# Patient Record
Sex: Female | Born: 1940 | Race: White | Hispanic: No | Marital: Married | Smoking: Former smoker
Health system: Southern US, Community
[De-identification: ages and names within clinical notes are randomized; demographics above are authoritative.]

## PROBLEM LIST (undated history)

## (undated) DIAGNOSIS — I4891 Unspecified atrial fibrillation: Secondary | ICD-10-CM

## (undated) DIAGNOSIS — F419 Anxiety disorder, unspecified: Secondary | ICD-10-CM

## (undated) DIAGNOSIS — K219 Gastro-esophageal reflux disease without esophagitis: Secondary | ICD-10-CM

## (undated) DIAGNOSIS — E213 Hyperparathyroidism, unspecified: Secondary | ICD-10-CM

## (undated) DIAGNOSIS — M810 Age-related osteoporosis without current pathological fracture: Secondary | ICD-10-CM

## (undated) DIAGNOSIS — I10 Essential (primary) hypertension: Secondary | ICD-10-CM

## (undated) DIAGNOSIS — Z950 Presence of cardiac pacemaker: Secondary | ICD-10-CM

## (undated) DIAGNOSIS — I82409 Acute embolism and thrombosis of unspecified deep veins of unspecified lower extremity: Secondary | ICD-10-CM

## (undated) DIAGNOSIS — I509 Heart failure, unspecified: Secondary | ICD-10-CM

## (undated) DIAGNOSIS — F329 Major depressive disorder, single episode, unspecified: Secondary | ICD-10-CM

## (undated) DIAGNOSIS — Z941 Heart transplant status: Secondary | ICD-10-CM

## (undated) DIAGNOSIS — I499 Cardiac arrhythmia, unspecified: Secondary | ICD-10-CM

## (undated) DIAGNOSIS — F32A Depression, unspecified: Secondary | ICD-10-CM

## (undated) HISTORY — PX: APPENDECTOMY: SHX54

## (undated) HISTORY — DX: Unspecified atrial fibrillation: I48.91

## (undated) HISTORY — PX: ABLATION ON ENDOMETRIOSIS: SHX5787

## (undated) HISTORY — DX: Age-related osteoporosis without current pathological fracture: M81.0

## (undated) HISTORY — PX: HEART TRANSPLANT: SHX268

## (undated) HISTORY — PX: NECK SURGERY: SHX720

## (undated) HISTORY — DX: Hyperparathyroidism, unspecified: E21.3

## (undated) HISTORY — PX: TOTAL ABDOMINAL HYSTERECTOMY W/ BILATERAL SALPINGOOPHORECTOMY: SHX83

## (undated) HISTORY — DX: Depression, unspecified: F32.A

## (undated) HISTORY — DX: Gastro-esophageal reflux disease without esophagitis: K21.9

## (undated) HISTORY — DX: Presence of cardiac pacemaker: Z95.0

## (undated) HISTORY — DX: Cardiac arrhythmia, unspecified: I49.9

## (undated) HISTORY — DX: Major depressive disorder, single episode, unspecified: F32.9

## (undated) HISTORY — DX: Heart failure, unspecified: I50.9

## (undated) HISTORY — PX: ABDOMINAL HYSTERECTOMY: SHX81

---

## 1984-08-28 HISTORY — PX: BREAST EXCISIONAL BIOPSY: SUR124

## 2000-09-18 ENCOUNTER — Ambulatory Visit (HOSPITAL_COMMUNITY): Admission: RE | Admit: 2000-09-18 | Discharge: 2000-09-18 | Payer: Self-pay | Admitting: Internal Medicine

## 2000-09-18 ENCOUNTER — Encounter: Payer: Self-pay | Admitting: Internal Medicine

## 2000-10-19 ENCOUNTER — Encounter: Admission: RE | Admit: 2000-10-19 | Discharge: 2000-10-19 | Payer: Self-pay | Admitting: Family Medicine

## 2000-10-19 ENCOUNTER — Encounter: Payer: Self-pay | Admitting: Family Medicine

## 2000-11-15 ENCOUNTER — Other Ambulatory Visit: Admission: RE | Admit: 2000-11-15 | Discharge: 2000-11-15 | Payer: Self-pay | Admitting: Internal Medicine

## 2000-11-15 ENCOUNTER — Encounter (INDEPENDENT_AMBULATORY_CARE_PROVIDER_SITE_OTHER): Payer: Self-pay | Admitting: Specialist

## 2001-06-25 ENCOUNTER — Encounter: Payer: Self-pay | Admitting: Family Medicine

## 2001-06-25 ENCOUNTER — Encounter: Admission: RE | Admit: 2001-06-25 | Discharge: 2001-06-25 | Payer: Self-pay | Admitting: Family Medicine

## 2002-06-26 ENCOUNTER — Encounter: Payer: Self-pay | Admitting: Family Medicine

## 2002-06-26 ENCOUNTER — Encounter: Admission: RE | Admit: 2002-06-26 | Discharge: 2002-06-26 | Payer: Self-pay | Admitting: Family Medicine

## 2003-07-06 ENCOUNTER — Ambulatory Visit (HOSPITAL_COMMUNITY): Admission: RE | Admit: 2003-07-06 | Discharge: 2003-07-06 | Payer: Self-pay | Admitting: Family Medicine

## 2003-08-29 HISTORY — PX: PACEMAKER INSERTION: SHX728

## 2003-11-04 ENCOUNTER — Encounter: Admission: RE | Admit: 2003-11-04 | Discharge: 2003-11-04 | Payer: Self-pay | Admitting: Family Medicine

## 2004-08-10 ENCOUNTER — Ambulatory Visit: Payer: Self-pay | Admitting: Internal Medicine

## 2004-08-10 ENCOUNTER — Inpatient Hospital Stay (HOSPITAL_COMMUNITY): Admission: EM | Admit: 2004-08-10 | Discharge: 2004-08-14 | Payer: Self-pay | Admitting: Emergency Medicine

## 2004-08-10 ENCOUNTER — Ambulatory Visit (HOSPITAL_COMMUNITY): Admission: RE | Admit: 2004-08-10 | Discharge: 2004-08-10 | Payer: Self-pay | Admitting: Family Medicine

## 2004-08-17 ENCOUNTER — Ambulatory Visit: Payer: Self-pay | Admitting: Family Medicine

## 2004-08-18 ENCOUNTER — Encounter: Admission: RE | Admit: 2004-08-18 | Discharge: 2004-08-18 | Payer: Self-pay | Admitting: Family Medicine

## 2004-08-18 IMAGING — CR DG CHEST 2V
2 series · 2 of 2 positions shown · non-contrast
Comparison: none

CLINICAL DATA: Bilateral effusions.  Short of breath. Follow up.
 CHEST X-RAY:
 Two views of the chest are compared to a chest x-ray from [HOSPITAL] dated [DATE].  The effusions have resolved and aeration has improved.  Cardiomegaly is stable.  Permanent pacemaker is noted.

[view not recorded (1 of 2)]
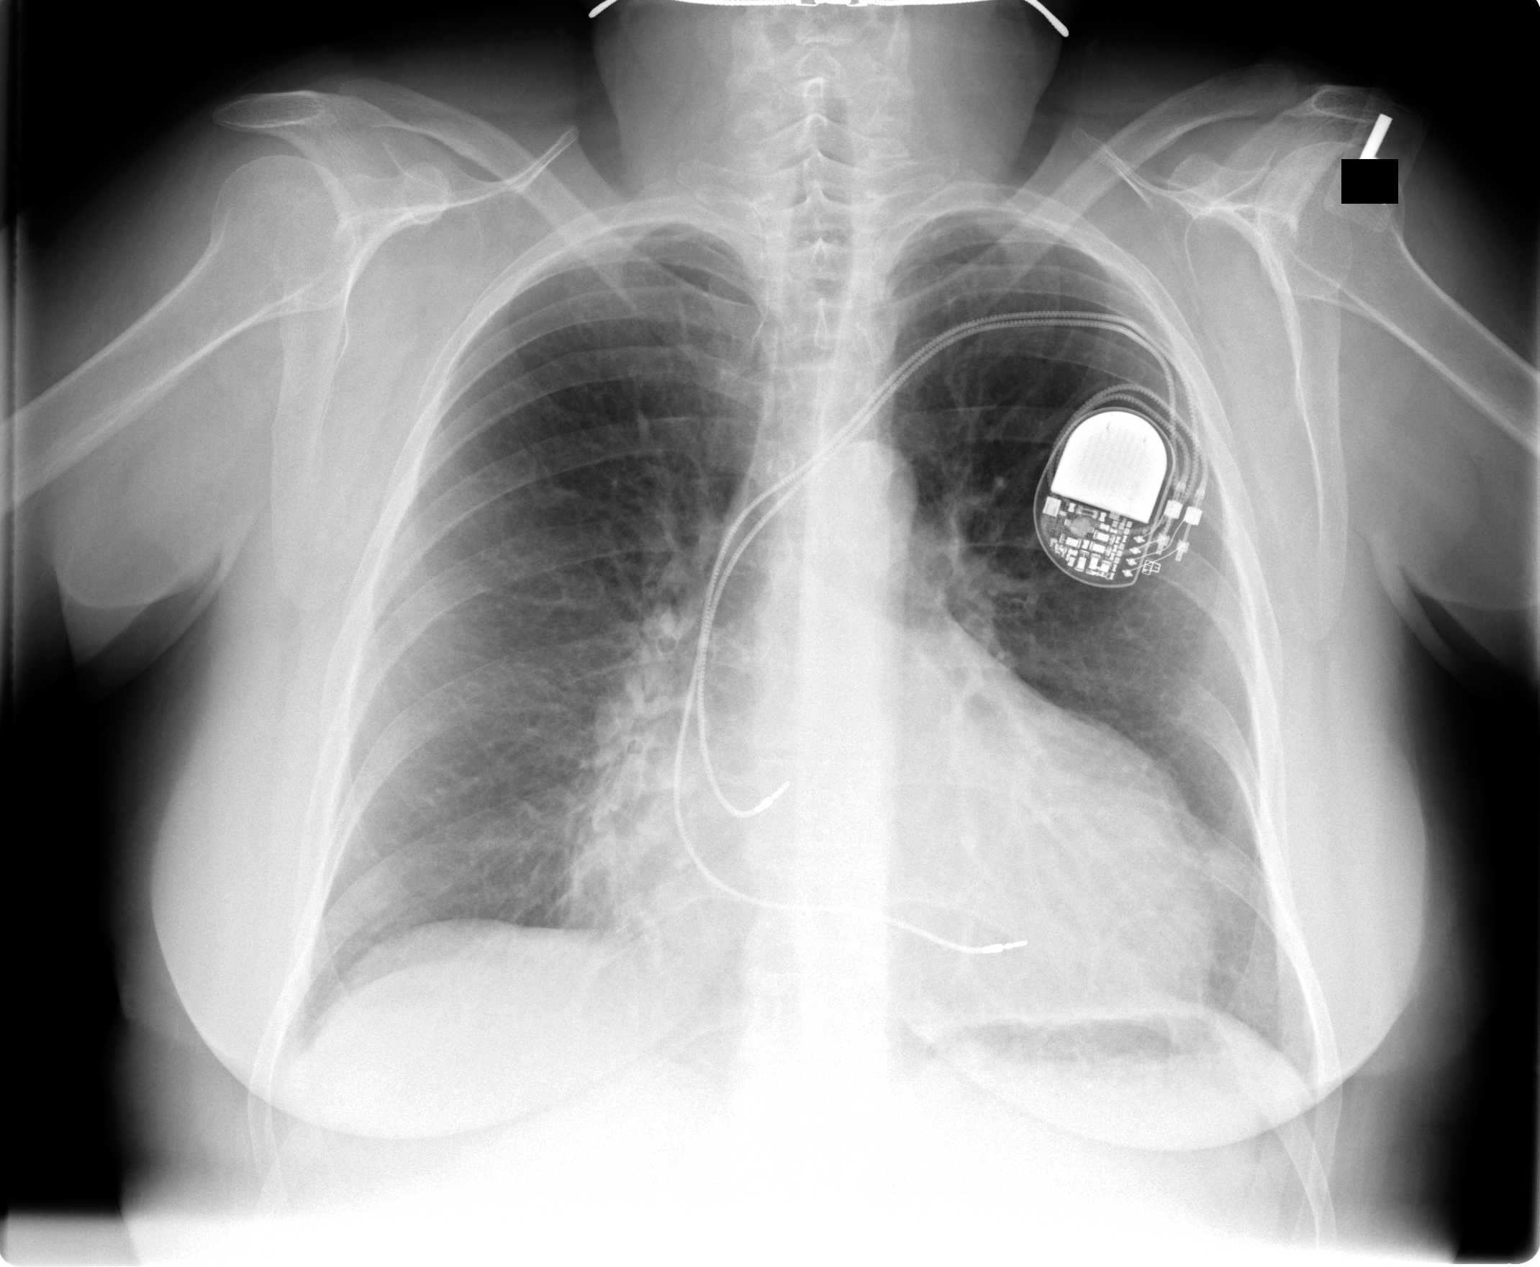

[view not recorded (2 of 2)]
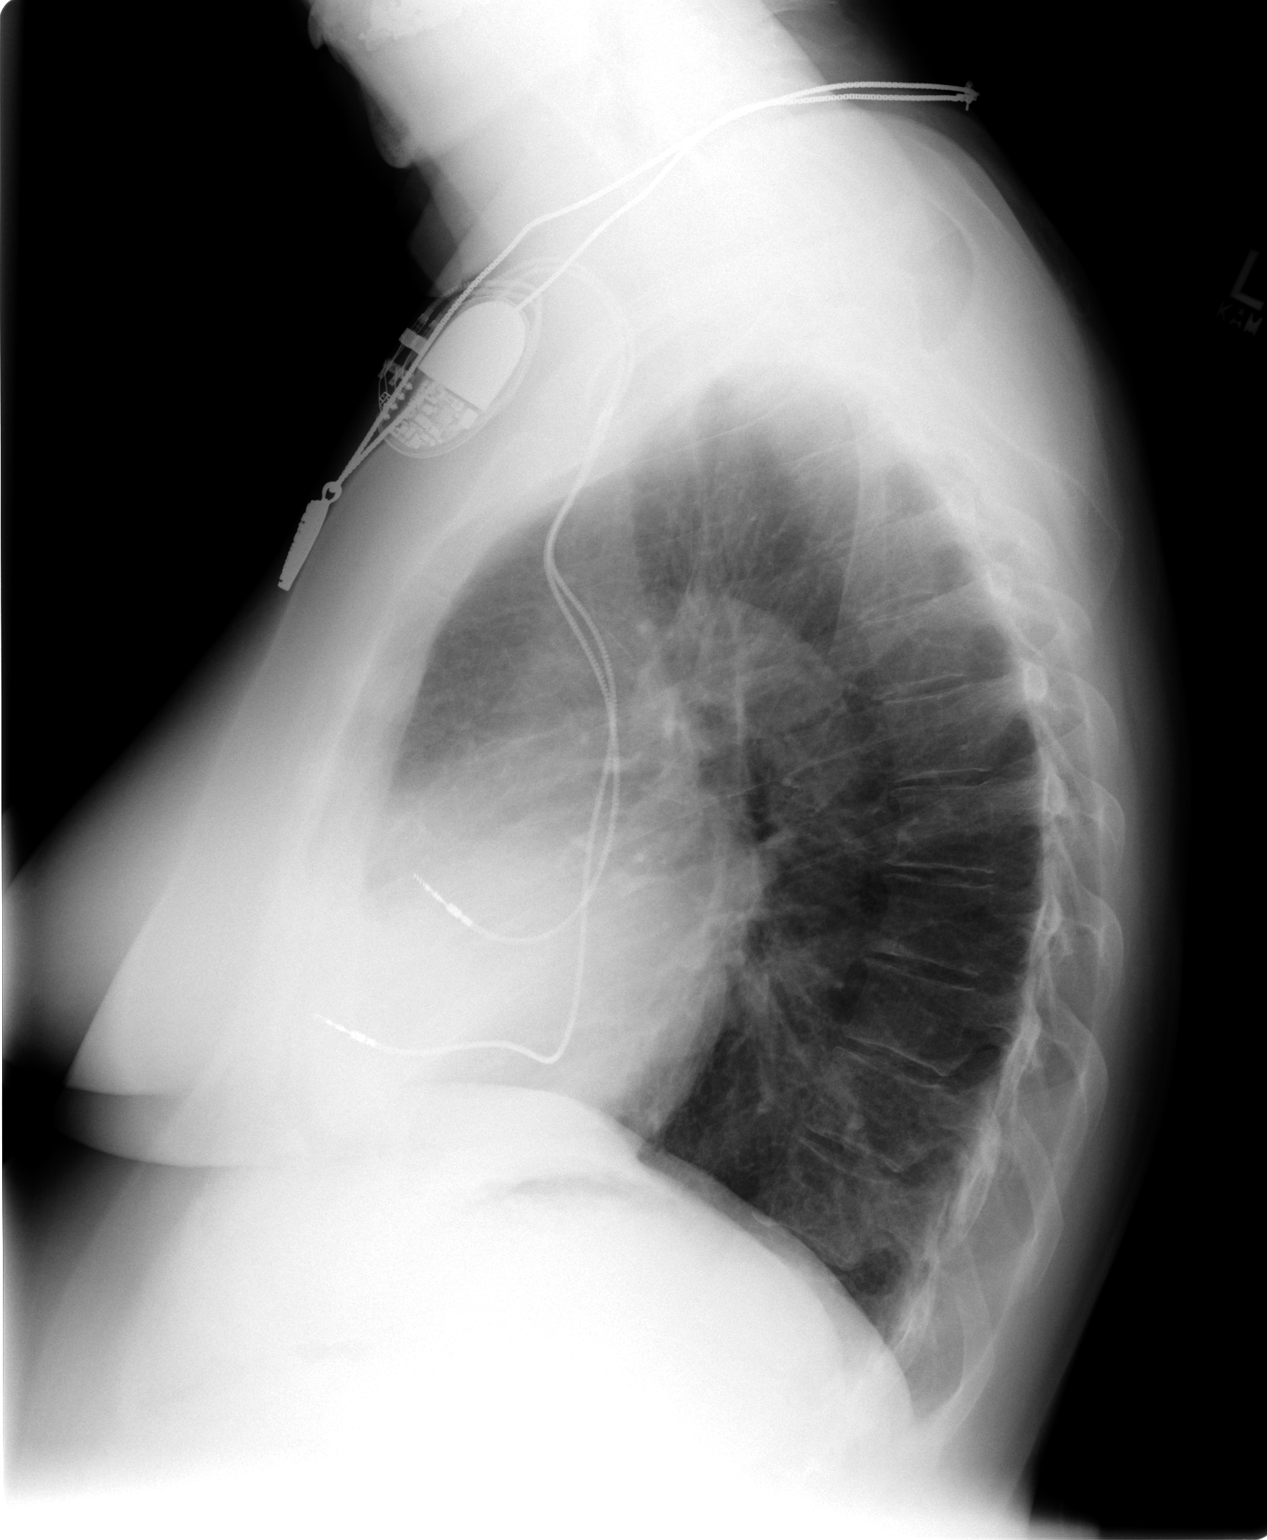

[2 of 2 positions shown; findings below may reference images not displayed]

IMPRESSION: 1.  Resolution of effusions with improved aeration.
 2.  Cardiomegaly with pacer.

## 2004-08-24 ENCOUNTER — Ambulatory Visit: Payer: Self-pay | Admitting: Family Medicine

## 2004-08-26 ENCOUNTER — Ambulatory Visit: Payer: Self-pay | Admitting: Family Medicine

## 2004-09-02 ENCOUNTER — Inpatient Hospital Stay (HOSPITAL_COMMUNITY): Admission: EM | Admit: 2004-09-02 | Discharge: 2004-09-03 | Payer: Self-pay | Admitting: Emergency Medicine

## 2004-09-03 ENCOUNTER — Ambulatory Visit: Payer: Self-pay | Admitting: Cardiology

## 2004-09-26 ENCOUNTER — Encounter: Admission: RE | Admit: 2004-09-26 | Discharge: 2004-09-26 | Payer: Self-pay | Admitting: Orthopaedic Surgery

## 2004-10-13 ENCOUNTER — Encounter (HOSPITAL_COMMUNITY): Admission: RE | Admit: 2004-10-13 | Discharge: 2005-01-11 | Payer: Self-pay | Admitting: Cardiology

## 2005-01-12 ENCOUNTER — Encounter: Admission: RE | Admit: 2005-01-12 | Discharge: 2005-01-28 | Payer: Self-pay | Admitting: Cardiology

## 2005-01-30 ENCOUNTER — Encounter (HOSPITAL_COMMUNITY): Admission: RE | Admit: 2005-01-30 | Discharge: 2005-02-25 | Payer: Self-pay | Admitting: Cardiology

## 2005-05-02 ENCOUNTER — Encounter (HOSPITAL_COMMUNITY): Admission: RE | Admit: 2005-05-02 | Discharge: 2005-07-31 | Payer: Self-pay | Admitting: Cardiology

## 2005-08-28 HISTORY — PX: ABLATION: SHX5711

## 2005-08-29 ENCOUNTER — Encounter (HOSPITAL_COMMUNITY): Admission: RE | Admit: 2005-08-29 | Discharge: 2005-11-27 | Payer: Self-pay | Admitting: Cardiology

## 2006-02-27 ENCOUNTER — Ambulatory Visit (HOSPITAL_COMMUNITY): Admission: RE | Admit: 2006-02-27 | Discharge: 2006-02-27 | Payer: Self-pay | Admitting: Internal Medicine

## 2006-04-19 ENCOUNTER — Encounter: Admission: RE | Admit: 2006-04-19 | Discharge: 2006-04-19 | Payer: Self-pay | Admitting: Gastroenterology

## 2006-05-03 ENCOUNTER — Encounter: Admission: RE | Admit: 2006-05-03 | Discharge: 2006-05-03 | Payer: Self-pay | Admitting: Internal Medicine

## 2006-06-02 ENCOUNTER — Inpatient Hospital Stay (HOSPITAL_COMMUNITY): Admission: EM | Admit: 2006-06-02 | Discharge: 2006-06-05 | Payer: Self-pay | Admitting: Emergency Medicine

## 2006-06-03 ENCOUNTER — Encounter (INDEPENDENT_AMBULATORY_CARE_PROVIDER_SITE_OTHER): Payer: Self-pay | Admitting: Cardiology

## 2006-06-03 ENCOUNTER — Encounter (INDEPENDENT_AMBULATORY_CARE_PROVIDER_SITE_OTHER): Payer: Self-pay | Admitting: Neurology

## 2007-04-09 ENCOUNTER — Ambulatory Visit (HOSPITAL_COMMUNITY): Admission: RE | Admit: 2007-04-09 | Discharge: 2007-04-09 | Payer: Self-pay | Admitting: Internal Medicine

## 2007-06-07 ENCOUNTER — Observation Stay (HOSPITAL_COMMUNITY): Admission: EM | Admit: 2007-06-07 | Discharge: 2007-06-09 | Payer: Self-pay | Admitting: Emergency Medicine

## 2007-08-29 HISTORY — PX: CARDIAC CATHETERIZATION: SHX172

## 2007-08-29 HISTORY — PX: HEART TRANSPLANT: SHX268

## 2007-11-28 ENCOUNTER — Encounter (HOSPITAL_COMMUNITY): Admission: RE | Admit: 2007-11-28 | Discharge: 2008-02-26 | Payer: Self-pay | Admitting: Cardiology

## 2008-10-29 ENCOUNTER — Ambulatory Visit (HOSPITAL_COMMUNITY): Admission: RE | Admit: 2008-10-29 | Discharge: 2008-10-29 | Payer: Self-pay | Admitting: Internal Medicine

## 2008-11-11 ENCOUNTER — Encounter (INDEPENDENT_AMBULATORY_CARE_PROVIDER_SITE_OTHER): Payer: Self-pay | Admitting: *Deleted

## 2009-09-09 ENCOUNTER — Telehealth: Payer: Self-pay | Admitting: Internal Medicine

## 2010-01-06 ENCOUNTER — Ambulatory Visit (HOSPITAL_COMMUNITY): Admission: RE | Admit: 2010-01-06 | Discharge: 2010-01-06 | Payer: Self-pay | Admitting: Internal Medicine

## 2010-06-15 ENCOUNTER — Encounter
Admission: RE | Admit: 2010-06-15 | Discharge: 2010-08-09 | Payer: Self-pay | Source: Home / Self Care | Attending: Orthopaedic Surgery | Admitting: Orthopaedic Surgery

## 2010-09-19 ENCOUNTER — Encounter: Payer: Self-pay | Admitting: Internal Medicine

## 2010-09-27 NOTE — Letter (Signed)
Summary: Recall Colonoscopy-Changed to Office Visit Letter  [facility]  [address] [place], [place] [address]   Phone: [phone]  Fax: [phone]      November 11, 2008 MRN: [id number]   [name] [name] [address] [place], [place]  [address]   Dear Ms. Juenger,   According to our records, it is time for you to schedule a Colonoscopy. However, after reviewing your medical record, I feel that an office visit would be most appropriate to more completely evaluate you and determine your need for a repeat procedure.  Please call [phone] (option #2) at your convenience to schedule an office visit. If you have any questions, concerns, or feel that this letter is in error, we would appreciate your call.   Sincerely,  Iva Boop, M.D.  [facility] [phone]

## 2010-09-27 NOTE — Progress Notes (Signed)
Summary: Schedule REV  Phone Note Outgoing Call Call back at [facility] Phone [phone]   Call placed by: Harlow Mares CMA Duncan Dull),  September 09, 2009 10:20 AM Call placed to: Patient Summary of Call: patient transfered care to Dr. Matthias Hughs, I had Lady Gary add a note in our system. Initial call taken by: Harlow Mares CMA (AAMA),  September 09, 2009 10:21 AM

## 2010-12-28 ENCOUNTER — Other Ambulatory Visit (HOSPITAL_COMMUNITY): Payer: Self-pay | Admitting: Internal Medicine

## 2010-12-28 DIAGNOSIS — Z1231 Encounter for screening mammogram for malignant neoplasm of breast: Secondary | ICD-10-CM

## 2011-01-09 ENCOUNTER — Ambulatory Visit (HOSPITAL_COMMUNITY): Payer: Self-pay

## 2011-01-10 ENCOUNTER — Ambulatory Visit (HOSPITAL_COMMUNITY)
Admission: RE | Admit: 2011-01-10 | Discharge: 2011-01-10 | Disposition: A | Discharge status: Home / Self Care | Payer: Medicare Other | Source: Ambulatory Visit | Attending: Internal Medicine | Admitting: Internal Medicine

## 2011-01-10 DIAGNOSIS — Z1231 Encounter for screening mammogram for malignant neoplasm of breast: Secondary | ICD-10-CM | POA: Insufficient documentation

## 2011-01-10 NOTE — H&P (Signed)
NAME:  [name], [name] NO.:  [id number]   MEDICAL RECORD NO.:  [id number]          PATIENT TYPE:  OBV   LOCATION:  6705                         FACILITY:  [facility]   PHYSICIAN:  Kari Baars, M.D.  DATE OF BIRTH:  04-01-1941   DATE OF ADMISSION:  06/07/2007  DATE OF DISCHARGE:                              HISTORY & PHYSICAL   CHIEF COMPLAINT:  Dizziness and fall.   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  The  patient has recently been placed on the transplant list due to her  severe cardiomyopathy.  She has being undergoing an extensive  pretransplant evaluation at [facility] [employer] and was actually  seen there yesterday. Her heart failure has been refractory to medical  therapy.  About 4 days ago, she states that she developed some upper  respiratory infection symptoms including rhinorrhea, sinus congestion  and pressure.  She took Robitussin-DM without significant improvement.  She has also had a cough.  Last evening, she noticed a significant  increase in dizziness and was unable to walk without holding onto her  husband.  She said that this was present when she was lying in bed and  when she was standing.  She describes it as in unsteadiness or swaying  feeling.  She actually did fall while trying to walk up the stairs with  her husband.  She did not have any loss of consciousness or any apparent  injury.  Due to her unsteadiness in the setting of her heart failure,  she felt unsafe to return home and was admitted for observation status.   REVIEW OF SYSTEMS:  All systems are reviewed with the patient and are  negative except as in HPI with the  following exceptions:  She does deny  chest pain, shortness of breath, edema, orthopnea, fever, chills,  sweats, headache, focal neurologic deficits.   PAST MEDICAL HISTORY:  1. 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  2. Paroxysmal atrial fibrillation status post ablation, status post      pacemaker placement, status post AICD on chronic anticoagulation.  3. Impaired glucose tolerance.  4. Hyperlipidemia.  5. Gastroesophageal reflux disease.  6. Status post total abdominal hysterectomy/bilateral salpingo-      oophorectomy.   CURRENT MEDICATIONS:  1. Digoxin 125 mcg daily.  2. Cozaar 25 mg daily.  3. Synthroid 50 mcg daily.  4. Coreg CR 20 mg daily.  5. Lasix 40 mg daily.  6. Nexium 40 mg b.i.d.  7. Spironolactone 25 mg 1/2 daily.  8. Effexor 37.5 mg two daily.  9. Klonopin 0.5 mg two q.h.s.  10.Coumadin 1.5 mg daily except for Monday, Wednesdays and Fridays      when she takes 2 mg.  11.Sucralfate 1 gram q.i.d.   ALLERGIES:  ANTIHISTAMINES and CODEINE.   FAMILY HISTORY:  Mother died of an MI at 85.   SOCIAL HISTORY:  She is married with two children.  She is actually  raising two grandchildren.  She has a remote 10-year pack-year smoking  history but quit in 1966.   PHYSICAL EXAM:  Temperature 98.6, blood pressure 113/66, pulse 74,  respirations 20, oxygen saturation 96% on room air. I reviewed the EMS  vitals and her blood pressure was 142/80 at that time with a heart rate  of 64 and oxygen saturation of 96% on room air.  GENERAL:  Fatigued-appearing, white female in no acute distress.  HEENT: Oropharynx is dry.  NECK:  Supple without lymphadenopathy, JVD or carotid bruits.  HEART:  Regular rate and rhythm with an S3.  LUNGS:  Clear to auscultation bilaterally.  No crackles.  ABDOMEN:  Soft, nondistended, nontender with normoactive bowel sounds.  EXTREMITIES:  No clubbing, cyanosis or  edema.  NEUROLOGIC:  Alert and oriented x4. No focal murdered motor or sensory  deficits.  Cranial nerves II-XII are intact.  Finger-to-nose is normal.   LABORATORY DATA:  CBC shows a white count of 12.3, hemoglobin 12.3,  platelets 268. BMET showed a sodium of 138, potassium 3.7, chloride 104,  bicarb 24, BUN 13, creatinine 1.2, glucose 113, albumin 3.2.  BNP 611  which is slightly increased from her baseline.  Troponin 0.12.  CK is  normal.   STUDIES:  1. EKG personally reviewed shows paced rhythm with PVCs.  2. Chest x-ray shows cardiomegaly with no edema.  3. Head CT shows no acute intracranial abnormalities.   ASSESSMENT/PLAN:  1. Dizziness - this is likely related to her sinus congestion and      acute sinusitis.  However, given her fall risk and frail status      with her underlying heart failure will observe her overnight      tonight.  Will hold on any additional hydration, although she may      be slightly dry, but in the setting of her heart failure I do not      want to tip her over.  Will hold on steroids for the same reason.      Meclizine as needed.  Will obtain a digoxin level, TSH, and A1c.  2. Acute sinusitis - saline nasal spray. Mucinex.  Will treat with      Augmentin 875 to cover bacterial organisms.  3. Congestive heart failure.  Continue her home regimen.  If she does      develop more overt congestive heart failure symptoms will transfer      her to [facility]. Will rule out MI although I do not think that      her symptoms are      cardiac in nature.  Will monitor on telemetry to rule out      dysrhythmia.  4. History of hypertrophy or glycemia - obtain an A1c.  5. Disposition - anticipate discharge tomorrow if stable on      antibiotics and prior medications.      Kari Baars, M.D.  Electronically Signed     WS/MEDQ  D:  06/07/2007  T:  06/07/2007  Job:  [id number]   cc:   Sandrea Matte, MD

## 2011-01-13 NOTE — Discharge Summary (Signed)
NAME:  [name] [name]                 ACCOUNT NO.:  [id number]   MEDICAL RECORD NO.:  [id number]          PATIENT TYPE:  OUT   LOCATION:  [facility]O                          FACILITY:  [facility]   PHYSICIAN:  Valetta Mole. Swords, M.D. [facility] OF BIRTH:  08-09-1941   DATE OF ADMISSION:  08/10/2004  DATE OF DISCHARGE:  08/14/2004                                 DISCHARGE SUMMARY   DISCHARGE DIAGNOSES:  1.  Nausea, vomiting and diarrhea, resolved.  2.  Fever, unknown etiology, empiric antibiotics.  3.  Tachycardia, improving, patient has a known history of arrhythmias      thought to be atrial fibrillation, status post pacemaker placement.  4.  Mood disorder.  5.  Bilateral pleural effusions.   DISCHARGE MEDICATIONS:  1.  Verapamil extended-release 180 mg daily.  2.  Klonopin 0.5 mg p.o. t.i.d.  3.  Effexor XR 150 mg p.o. daily.  4.  Prevacid 30 mg p.o. daily.  5.  (New medicine) Omnicef 300 mg one p.o. b.i.d. for five days (empiric      treatment for fever).   CONDITION ON DISCHARGE:  Improved.   RESTRICTIONS:  He is to maintain a bland diet and advance as tolerated.   FOLLOWUP PLAN:  Follow up with Dr. Tawanna Cooler this week.   HOSPITAL COURSE:  Problem 1.  Gastrointestinal. The patient presented to the  hospital on August 10, 2004, with nausea, vomiting and diarrhea.  This  resolved spontaneously at the time of discharge.  He was tolerating a diet  without difficulty.  Cardiac patient with a history of atrial fibrillation  and other arrhythmias (unknown) to acquire pacemaker placement.  In the  hospital, he had a period of decreased blood pressure.  Verapamil was to be  discontinued.  I am not sure if that was ever done.  Heart rate then  increased.  Heart rate at the time of discharge was around 100.  She  continues on Verapamil and is doing well without shortness of breath,  palpitations or chest pain.  Given the tachycardia, I did do a CT scan of  the chest which resolved as above demonstrating  bilateral pleural effusions.   Problem 2.  Infectious disease.  The patient presented with the above  symptoms.  During her hospitalization, she had a T-max of 102.7.  Antibiotics were started empirically.  CBC was normal on August 13, 2004.  Blood cultures remained negative to date.  Urine cultures were negative.   In summary, the patient presented with GI symptoms and developed a fever.  I  suspect all of the abnormal findings are related to some underlying  infection which could be viral.  I suspect her GI complaints and pulmonary  findings are all related.  I think these should be followed up as an  outpatient.  The patient is eager to go home, tolerating a diet and  ambulating without difficulty.  She will follow up with Dr. Tawanna Cooler later this  week.      BHS/MEDQ  D:  08/14/2004  T:  08/14/2004  Job:  [id number]   cc:  Jeffrey A. Tawanna Cooler, M.D. [facility]

## 2011-01-13 NOTE — Discharge Summary (Signed)
[name], [name]                 ACCOUNT NO.:  [id number]   MEDICAL RECORD NO.:  [id number]          PATIENT TYPE:  INP   LOCATION:  3743                         FACILITY:  [facility]   PHYSICIAN:  Kari Baars, M.D.  DATE OF BIRTH:  [birth year]   DATE OF ADMISSION:  06/02/2006  DATE OF DISCHARGE:  06/05/2006                                 DISCHARGE SUMMARY   DISCHARGE DIAGNOSES:  1. Possible transient ischemic attack in the setting of transient      hypotension.  2. Congestive heart failure, idiopathic, with ejection fraction previously      13%, 35% on echocardiogram during this hospitalization.  3. Paroxysmal atrial fibrillation status post ablation.  4. Status post pacemaker/automatic implantable cardioverter defibrillator      placement.  5. Depression.  6. Mitral regurgitation.  7. Gastroesophageal reflux disease.  8. Hyperglycemia.   DISCHARGE MEDICATIONS:  1. Coreg CR 20 mg daily.  2. Amiodarone 200 mg daily.  3. Digoxin 0.125 mg daily.  4. Lasix 40 mg daily.  5. Cozaar 50 mg daily.  6. Ergocalciferol 50,000 units weekly.  7. Calcium/vitamin D twice daily.  8. Nasonex 1 spray each nostril 1-2 times per day.  9. Klonopin 0.5 mg 1-2 nightly.  10.Nexium 40 mg b.i.d.  11.Carafate 1 gram p.r.n.  12.Effexor 37.5 two pills nightly.  13.Spironolactone 12.5 mg daily.  14.Premarin vaginal cream as before.  15.Vitamin B complex.  16.Coumadin as directed-will hold tonight's dose due to supratherapeutic      INR and repeat INR in 1 week.   HOSPITAL PROCEDURES:  1. Carotid Doppler/transcranial carotid ultrasound/transcranial Dopplers-      no evidence of carotid or vertebral stenosis.  2. Transthoracic echocardiogram (October 7) ejection fraction estimated at      35%.  Mild focal basal septal hypertrophy.  Increased relative      contribution of atrial contraction to left ventricular filling.      Doppler parameters consistent with abnormal left ventricular  relaxation.  Mild mitral regurgitation.  3. A head CT without contrast (October 6):  No evidence of intracranial      hemorrhage, large acute infarct, or intracranial mass.   HOSPITAL CONSULTS:  Neurology (Dr. Avie Echevaria).   HISTORY OF PRESENT ILLNESS:  For full details, please see dictated history  and physical.  Briefly,  [name] [name] is a 70 year old white female with a  history of idiopathic cardiomyopathy with an ejection fraction of 13%,  paroxysmal atrial fibrillation status post recent ablation, and vitamin D  deficiency who presented to the Emergency Department on October 6 with a  complaint of dizziness, weakness, and slurred speech.  The patient  was in  her usual state of health until the morning of admission when she was  driving her grandchildren.  She states that she became dysarthric and  lightheaded.  She proceeded to her sister's house where EMS was activated.  When EMS arrived, her systolic blood pressure was in the high 70s.  A code  stroke was initiated and the patient was brought to the emergency room for  further evaluation.  In the Emergency Department, a head CT was performed  that showed no evidence of acute stroke.  She was evaluated by neurology who  recommended medical admission.   HOSPITAL COURSE:  The patient was admitted to a medical bed.  Her  antihypertensives were initially held and she was gently hydrated.  With  this, her hypotension resolved.  With improvement in her blood pressure, her  dysarthria resolved.  She had no other focal neurologic deficits.  It was  felt the cause of her brief altered mental status and dysarthria was  possible TIA in the setting of hypotension.  To further evaluate for other  potential causes, a transcranial Doppler carotid ultrasound was performed  that showed no evidence of carotid or vertebral stenosis.  An echocardiogram  was also performed that showed no source of clot.  Ejection fraction on this  was the reported  at 35% which would be an improvement compared to her prior  echocardiograms.  She was monitored on telemetry and appeared to remain in a  paced rhythm with no evidence of atrial fibrillation.  She was restarted on  her home medications with a change from short-acting Coreg 12.5 mg b.i.d. to  long-acting Coreg 20 mg daily on the day prior to discharge.  She tolerated  this well with stabilization of her blood pressure and no recurrence of  symptoms.  At this point, it is felt that her brief episode was due to  transient hypotension in the setting of her heart failure and medications.  With medication adjustments, she has remained stable and is stable for  discharge home with close followup at [facility] and with Dr. Clelia Croft.   DISCHARGE LABS:  CBC on the day of discharge:  White count 7.5, hemoglobin  12.4, platelets 207.  INR 3.5.  BNP on the day prior discharge 583 which was  increased from 208 on the day prior.  She did have excellent diuresis on the  day prior to discharge with about 5 pounds of weight loss.  BMET on October  8:  Sodium 141, potassium 4.1, chloride 104, bicarb 24, BUN 16, creatinine  1.2, glucose 169.  Cardiac enzymes were negative x3.   DISCHARGE INSTRUCTIONS:  1. The patient was instructed today to weigh herself daily.  2. She should monitor her blood pressure at home and call if it is less      than 90/50 or if she has recurrent symptoms.   HOSPITAL FOLLOWUP:  She will follow up with Dr. Clelia Croft in 1 week to recheck  her blood pressure.  She should call Dr. Clarene Duke at [facility] to determine  when followup at [facility] should be arranged.   DISPOSITION:  To home with her husband.      Kari Baars, M.D.  Electronically Signed     WS/MEDQ  D:  06/05/2006  T:  06/07/2006  Job:  [id number]   cc:   [name] [name] AT [facility] DR.  Chrissie Noa LITTLE AND DR. Janeann Merl. Love, M.D.

## 2011-01-13 NOTE — Cardiovascular Report (Signed)
[name] [name], [name] [name] NO.:  [id number]   MEDICAL RECORD NO.:  [id number]          PATIENT TYPE:  INP   LOCATION:  2908                         FACILITY:  [facility]   PHYSICIAN:  Salvadore Farber, M.D. LHCDATE OF BIRTH:  [birth year]   DATE OF PROCEDURE:  09/03/2004  DATE OF DISCHARGE:                              CARDIAC CATHETERIZATION   PROCEDURE:  Left heart catheterization, left ventriculography, coronary  angiography.   INDICATIONS:  [name] is a 70 year old lady with a history of paroxysmal  atrial fibrillation and possibly supraventricular arrhythmia. She has no  known history of coronary artery disease. In fact, she underwent cardiac  catheterization in the spring of 2005 at [facility].  She reports this to be normal.   She now presents with 1 month of shortness of breath and right shoulder  discomfort, worse over the past several days.  She was admitted to hospital  and found to have a CK-MB elevated at 140. She had ongoing right shoulder  discomfort, concerning for ongoing angina.  Decision was therefore made to  proceed with emergent diagnostic angiography and possible percutaneous  intervention.   PROCEDURAL TECHNIQUE:  Informed consent was obtained.  Under 1% lidocaine  local anesthesia, a 6-French sheath was placed in right common femoral  artery using the modified Seldinger technique.  Diagnostic angiography and  ventriculography were performed using JL-4, JR-4, and pigtail catheters. The  patient tolerated the procedure well, and was transferred to the holding  room in stable condition. Sheaths were removed there.   COMPLICATIONS:  None.   FINDINGS:  1.  LV: 131/24/29. EF 13% with akinesis of the entirety of the heart with      the exception of the basal most segment both inferiorly and anteriorly.  2.  Mitral regurgitation 3+.  No aortic stenosis.  3.  Left main: Angiographically normal.  4.  LAD: Moderate size vessel  giving rise to 3 diagonal branches. The larger      second diagonal has a 30% ostial stenosis. There is a 20% lesion of the      LAD at the site of the takeoff of the second diagonal.  5.  Circumflex: Relatively small vessel giving rise to a single obtuse      marginal. There is 20-30% stenosis at its ostium.  6.  RCA: Large, dominant vessel. It is angiographically normal.   IMPRESSION/RECOMMENDATIONS:  [name] has severe, nonischemic  cardiomyopathy. 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  However, the 1 month's history of antecedent  illness argues somewhat against this.   Left ventricular systolic function is severely impaired. There is 3+ mitral  regurgitation and marked elevation in the left ventricular  end-diastolic pressure.   Will discontinue her aspirin and heparin. We will add ACE to the preexisting  diuretics and beta-blocker. Will reinitiate heparin after sheath removal and  bridge the therapeutic Coumadin, given her severe left ventricular systolic  dysfunction.      WED/MEDQ  D:  09/03/2004  T:  09/03/2004  Job:  [id number]   cc:  Robyne Peers, M.D.   Jeffrey A. Tawanna Cooler, M.D. [facility]

## 2011-01-13 NOTE — Consult Note (Signed)
NAME:  [name] [name], [name] [name] NO.:  [id number]   MEDICAL RECORD NO.:  [id number]          PATIENT TYPE:  EMS   LOCATION:  MAJO                         FACILITY:  [facility]   PHYSICIAN:  Genene Churn. Love, M.D.    DATE OF BIRTH:  [birth year]   DATE OF CONSULTATION:  06/02/2006  DATE OF DISCHARGE:                                   CONSULTATION   This 70 year old, right-handed, white married is seen in the emergency room  as a code stroke for evaluation of dizziness, difficulty with speech and  tingling in her hands.   HISTORY OF PRESENT ILLNESS:  [name] [name] has a known history of congestive  heart failure without hypertension and has a primary cardiomyopathy treated  with diuretic medications.  She has had a pacemaker and defibrillator for  approximately 2 years and for recurrent atrial fibrillation underwent  ablation procedure in July of 2007.  It has been noted that she has good  days and bad days and today, for her, has been not a good day.  She has not  felt well.  She drove to her sister-in-laws about 5 p.m. and felt dizzy.  She felt like she was going to pass out.  She had a tingling in her fingers  and had some dysarthria.  She felt generally weak.  There was no chest pain,  palpitations, double vision, single eye visual loss, swallowing problems or  slurred speech.  A code stroke was called in the field for a question of  aphagia and she came to the emergency room.  On arrival, her NIH stroke  scale was 0.  Blood pressure was 82/47 on arrival.  She has no known history  of stroke, alcohol or drug use.  She does have a past medical history of  atrial fibrillation, coronary artery disease, congestive heart failure,  pacemaker defibrillator ablation procedure and a past history of cigarette  use.   MEDICATIONS:  Her medications are:  1. Amiodarone HDL 200 mg once by mouth daily.  2. B-complex vitamins, vitamin D once per day.  3. Coreg 12.5 mg one tablet by mouth twice per  day.  4. Clonazepam 0.5 mg two q.h.s.  5. Digoxin 0.125 mg one by mouth once daily.  6. Lovenox subcutaneously every 12 hours.  7. __________ once per week.  8. Nexium 40 mg once by mouth.  9. Estrogen conjugated Premarin cream use every as directed.  10.Furosemide 40 mg once by mouth once daily.  11.Losartan or Cozaar 50 mg once by mouth once per day.  12.Spironolactone 1/2 tablet p.o. daily.  13.____________ one tablet by mouth daily.  14.Venlafaxine, HDL, Effexor 37.5 mg two tablets p.o. q.h.s.  15.Sodium, warfarin, Coumadin 5 mg as directed.   She has no history of diabetes mellitus, hypertension, kidney stones,  gallbladder stones, stroke, cancer, convulsions, unconsciousness or  _________ disease.  She does, possibly, have a past medical history of  peptic ulcer disease and has had a hysterectomy in the past.   FAMILY HISTORY:  Her mother died at 12 from heart disease.  Her father died  at 70 from cancer.  She has a sister, 31, living and well.  She as 2 sons,  40 and 46, living and well.  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   ALLERGIES:  SHE ALLERGIES TO:  1. CODEINE.  2. ANTIHISTAMINES.   EXAMINATION:  GENERAL:  Revealed a well-developed white female.  VITAL SIGNS:  Her blood pressure in her left arm was 85/60.  Her heart rate  was 69.  There were no carotid or supraclavicular bruits heard.  The neck  flexion and extension was unremarkable lying.  The blood pressure after 450  cc sodium was 100/60 and standing went to 90/60.  MENTAL STATUS:  She was alert and oriented x3.  Her cranial nerve  examination revealed a question of a right visual field cut in the upper  field, but I could not reproduce.  This was right upper quadrant, right eye  only.  The pupils reacted from 4 to 3 bilaterally.  Corneals are present and  patient's face was equal.  There was no facial motor asymmetry.  Hearing was  present with air conduction  greater than bone conduction.  The tongue was  midline.  The uvula was midline and gages were present.  Motor examination  with good strength in the upper and in the lower extremities.  She has good  finger-to-nose and good heel-to-shin.  Sensory examination was intact.  Deep  tendon reflexes were 1+.   CT scan was normal.  Twelve-lead EKG showed normal sinus rhythm with AV  sequential or dual chamber pacemaker.  Sodium was 139, potassium 3.5,  chloride 106, CO2 content was 21.  Her BUN was 21.  Her glucose is 113.  Hemoglobin is 13.3, hematocrit 39.0.   IMPRESSION:  Possible hypotension event, code 458, versus transient ischemic  attack, code 435.9.   PLAN:  At this time is to discuss this with her primary care physician.  I  think, she probably should be admitted for observation.  It is not clear to  me what happened to her at this time.  I think Doppler studies of the  carotids would be appropriate and a chest x-ray and complete blood studies.           ______________________________  Genene Churn. Sandria Manly, M.D.     JML/MEDQ  D:  06/02/2006  T:  06/02/2006  Job:  [id number]

## 2011-01-13 NOTE — H&P (Signed)
NAME:  [name] [name], [name] [name]                 ACCOUNT NO.:  [id number]   MEDICAL RECORD NO.:  [id number]          PATIENT TYPE:  INP   LOCATION:  3743                         FACILITY:  [facility]   PHYSICIAN:  Barry Dienes. Eloise Harman, M.D.DATE OF BIRTH:  11/07/1940   DATE OF ADMISSION:  06/02/2006  DATE OF DISCHARGE:                                HISTORY & PHYSICAL   CHIEF COMPLAINT:  Altered mental status.   HISTORY OF PRESENT ILLNESS:  The patient is a 70 year old white female who  today was a bit fatigued, and while driving she got dizzy and weak, with  slurred speech.  Her symptoms lasted a total of approximately 2 hours.  She  also had some bilateral fingertip numbness and tingling.  Currently, her  symptoms are entirely resolved.  She denied recent palpitations, chest pain,  nausea, vomiting, or anxiety.  She did complain of mild bilateral arm  weakness.  She has been able to move all of her extremities well and can  walk without assistance.   PAST MEDICAL HISTORY:  1. Congestive heart failure.  2. Idiopathic dilated cardiomyopathy, with a left ventricular ejection      fraction of approximately 13% and diffuse akinesis.  3. Atrial fibrillation, status post ablation and cardiac      pacemaker/defibrillator placement.  4. Coronary artery disease.  5. History of tobacco use.  6. Gastroesophageal reflux disease.  7. Depression.  8. Mitral regurgitation.   MEDICATIONS PRIOR TO ADMISSION:  1. Amiodarone 200  mg once daily.  2. B complex vitamins 1 cap p.o. daily.  3. Coreg 12.5 mg p.o. b.i.d.  4. Clonazepam 0.5 mg take 2 tabs p.o. at bedtime.  5. Digoxin 125 mcg p.o. daily.  6. Vitamin D 50,000 units by mouth once weekly.  7. Nexium 40 mg p.o. b.i.d.  8. Premarin cream, use every week in the area of the urethra.  9. Lasix 40 mg p.o. once daily.  10.Losartan 50 mg p.o. b.i.d.  11.Aldactone 25 mg tablets, take 1/2 tab p.o. daily.  12.Carafate 1 g p.o. daily.  13.Effexor 37.5 mg, take 2  tabs p.o. at bedtime.  14.Coumadin 5 mg tablets, take as directed.   ALLERGIES:  1. ANTIHISTAMINES.  2. CODEINE.   PAST SURGICAL HISTORY:  1. Remote total abdominal hysterectomy and appendectomy.  2. In 2005, pacemaker and cardiac defibrillator placement.  3. In January 2006, cardiac catheterization.   FAMILY HISTORY:  Mother died at age 26 of myocardial infarction.   SOCIAL HISTORY:  She is married and lives with her husband.  She has two  children.  She has a history of tobacco use of 10 pack-years.  It was  stopped in 1966.  No history of alcohol abuse.  She has two grandsons that  live with her, as one of her daughters has psychological issues that  preclude unassisted hospitalizations.   REVIEW OF SYSTEMS:  She denies recent vision change, headaches, shortness of  breath at rest, palpitations, chest pain, abdominal pain, nausea, vomiting,  diarrhea, dysuria, anxiety, or depression.   INITIAL PHYSICAL EXAMINATION:  VITAL SIGNS:  Blood pressure  82/42, pulse 69,  respirations 18, temperature 97, pulse oxygen saturation 97% on room air.  GENERAL:  She is an elderly white female who is in no apparent distress  while lying fully supine.  HEENT:  Within normal limits.  NECK:  Supple, and without jugular venous distention or carotid bruit.  CHEST:  Clear to auscultation.  HEART:  Had a regular rate and rhythm, with a systolic ejection murmur,  grade 2/6, at the left sternal border.  ABDOMEN:  Had normal bowel sounds and no hepatosplenomegaly or tenderness.  EXTREMITIES:  Without cyanosis, clubbing, or edema.  NEUROLOGIC:  She is alert and well oriented and answers questions well.  She  has intact light touch sensation throughout her body, and grossly normal  muscle strength of all 4 limbs.   INITIAL LABORATORY STUDIES:  Hemoglobin 13.3, hematocrit 39.  Serum sodium  139, potassium 3.5, chloride 100, carbon dioxide 21, BUN 21, creatinine 1.2,  glucose 113.  Troponin I level was  less than 0.05.  A CT scan of the head  without contrast showed no acute abnormalities per report.  A 12-lead EKG  was pending at the time of dictation.   IMPRESSION AND PLAN:  1. Altered mental status.  This is likely secondary to hypotension due to      medications, and a bit less likely due to transient arrhythmia.  I      doubt that her symptoms are due to infection.  We will check overnight      telemetry and a carotid ultrasound exam as well as an echocardiogram.  2. History of dilated cardiomyopathy, with congestive heart failure.      Stable.  Her blood pressure may be a bit too low for her and may have      precipitated her symptoms today.  Currently, she seems euvolemic and      certainly not volume overloaded, given her comfort with lying supine.      I plan on checking a baseline BNP level and giving      her cautious slow-rate IV fluids overnight.  We will recheck a BNP      test.  3. Anticoagulation.  Clinically stable.  We will check a PT and INR test      during the hospitalization.  We will also have the pharmacist follow      her to assist with optimization of her Coumadin dosing.           ______________________________  Barry Dienes. Eloise Harman, M.D.     DGP/MEDQ  D:  06/02/2006  T:  06/04/2006  Job:  [id number]   cc:   Kari Baars, M.D.  Salvadore Farber, MD  Kristine Garbe Little, M.D.

## 2011-01-13 NOTE — Discharge Summary (Signed)
[name] [name], EADES                 ACCOUNT NO.:  [id number]   MEDICAL RECORD NO.:  [id number]          PATIENT TYPE:  INP   LOCATION:  2908                         FACILITY:  [facility]   PHYSICIAN:  Carole Binning, M.D. LHCDATE OF BIRTH:  02/08/1941   DATE OF ADMISSION:  09/02/2004  DATE OF DISCHARGE:  09/03/2004                                 DISCHARGE SUMMARY   PROCEDURE:  1.  Cardiac catheterization.  2.  Coronary arteriogram.  3.  Left ventriculogram.   HOSPITAL COURSE:  [name] [name] is a 70 year old female with a history of  paroxysmal atrial fibrillation and is status post dual-chamber pacemaker.  She stated she had catheterization in March 2005 which did not show any  significant disease.  She came to the hospital a month ago and was admitted  for nausea, vomiting, diarrhea, weakness and shortness of breath.  She had  cardiomegaly and pleural effusions on chest x-ray and was diuresed prior to  discharge.   She came to the hospital on September 02, 2004 for chest discomfort that lasted  about a hour but recurred.  Also, she has dyspnea on exertion and shortness  of breath.  Her presentation was consistent with acute coronary system as  well as congestive heart failure.  The patient was admitted for further  evaluation and treatment.   Her initial BNP was 1190, the D-dimer was checked and was negative at 0.25.  Cardiac enzymes were cycled and were elevated with the initial CK-MB  650/146.7 with a troponin I of 11, but on September 04, 2003, this had  increased to 794/152 with a troponin I of 25.91.  She was still having  shortness of breath and some shoulder discomfort, and it was felt that she  needed cardiac catheterization.   Cardiac catheterization was performed on September 03, 2004 and it showed a 30%  diagonal, 20% LAD and 30% circumflex.  Her EF was 13% with akinesis of all,  except for the base. She has 3+ MR and no AS.  Dr. Samule Ohm evaluated the  films and felt that she had  severe nonischemic cardiomyopathy, with the LV-  gram and enzymes suggestive of Takayasu's cardiomyopathy.   Aspirin and Integrilin were discontinued.  ACE inhibitor was added to her  medication regimen, and it was felt that she needed ongoing diuresis.  Beta-  blocker was added to her medication regimen as well.  It was felt that she  needed anticoagulation with Coumadin and bridging with heparin for severe  left ventricular dysfunction.   She is followed by Dr. Robyne Peers at [facility].  They were contacted and have agreed to take her in  transfer.  She is stable post-catheterization, and will be transferred to  [facility] as soon as a bed is available.   LABORATORY DATA:  Hemoglobin 12.3, hematocrit 37.0, WBC 12.0, platelets  269,000.  Sodium 139, potassium 3.9, chloride 111, CO2 22, BUN 13,  creatinine 0.9, glucose 113, albumin 3.3, AST 120, ALT 29, alkaline  phosphatase 58, total bilirubin 0.5, total protein 6.3, calcium  9.1.  Lipid  profile is pending at the time of dictation.  TSH 1.938.  Chest x-ray:  Enlarged cardiac silhouette and mild, diffuse, peribronchial thickening,  clear lungs, stable left subclavian pacemaker leads, no acute abnormality.  INR 1.1, PTT 31.   DISCHARGE DIAGNOSES:  1.  Chest pain, no critical coronary artery disease by catheterization and a      negative D-dimer.  2.  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  3.  Congestive heart failure.  4.  Status post dual-chamber pacemaker.  5.  Paroxysmal atrial fibrillation on propafenone.  6.  Family history of premature coronary artery disease.  7.  Remote history of tobacco use.  8.  Gastroesophageal reflux disease.  9.  Depression.  10. Status post total abdominal hysterectomy and appendectomy.  11. ALLERGY OR INTOLERANCE TO CODEINE AND ANTIHISTAMINES.   DISPOSITION:  [name] [name] is  considered stable for transfer to [facility] and will follow-up with [facility] on a p.r.n.  basis.      Rhon   RB/MEDQ  D:  09/03/2004  T:  09/03/2004  Job:  [id number]

## 2011-01-13 NOTE — H&P (Signed)
NAME:  [name] [name], [name] [name]                 ACCOUNT NO.:  [id number]   MEDICAL RECORD NO.:  [id number]          PATIENT TYPE:  EMS   LOCATION:  ED                           [facility]ACILITY:  [facility]   PHYSICIAN:  Georgina Quint. Plotnikov, M.D. Romualdo Bolk OF BIRTH:  03/01/1941   DATE OF ADMISSION:  08/10/2004  DATE OF DISCHARGE:                                HISTORY & PHYSICAL   CHIEF COMPLAINT:  Weakness, diarrhea.   HISTORY OF PRESENT ILLNESS:  The patient is a 70 year old female with  nausea, vomiting, and diarrhea of 1 day's duration.  She presented to the ER  with weakness, was treated originally with the goal to be discharged;  however, she has not improved much and later developed rapid heart rate.  She continues to complain of abdominal cramps and diarrhea with weakness.  No chest pain or shortness of breath.   PAST MEDICAL HISTORY:  1.  GERD.  2.  Depression.  3.  History of arrhythmias, status post pacemaker placement about a year ago      at [facility] by Dr. Clarene Duke, for bradycardia.  4.  She also has a history of unusual weakness, she tells me, that      apparently was not in atrial fibrillation.  She was told she does need      to be anticoagulated.   CURRENT MEDICATIONS:  1.  Clonazepam 0.5 mg t.i.d.  2.  Effexor XR 150 mg daily.  3.  Prevacid 30 mg daily.  4.  Verapamil 180 mg daily.  5.  Verapamil 120 mg daily.   ALLERGIES:  1.  CODEINE.  2.  ANTIHISTAMINES.   SOCIAL HISTORY:  She lives with her husband and two grandchildren.  Does not  smoke or drink.   FAMILY HISTORY:  Negative for coronary disease.   REVIEW OF SYSTEMS:  Negative for chest pain or shortness of breath.  No  syncope.  No neurologic complaints.  No one else is sick in the family  currently.  The rest is as above or negative.   PHYSICAL EXAMINATION:  VITAL SIGNS:  Temperature 98.4, blood pressure  113/78, heart rate 86, respirations 24, saturations 98% on room air.  Lately  heart rate in 130s.  On the monitor it  looks like atrial fibrillation.  She  is in mild acute distress, appears tired.  HEENT:  With dry oral mucosa.  NECK:  Supple, no meningeal signs.  LUNGS:  Clear with decreased breath sounds at bases.  HEART:  S1, S2, tachycardic, irregular rhythm.  ABDOMEN:  Soft, sensitive diffusely, no tenderness or masses.  RECTAL EXAMINATION:  Per Dr. Estell Harpin.  SKIN:  Clear.  NEUROLOGIC:  She is alert, oriented, cooperative.   LABORATORY DATA:  White count 11.9, hemoglobin 15.3, sodium 135, potassium  3.8, glucose 126, creatinine 0.9, BUN 16.  EKG:  Atrial fibrillation versus  sinus tachycardia with PACs.   ASSESSMENT AND PLAN:  1.  Dehydration.  We will treat with IV fluids.  2.  Gastroenteritis with nausea, vomiting, diarrhea, likely viral, given      Zofran, start Lomotil.  3.  Status post pacemaker placement with a history of arrhythmia.  4.  Tachycardia with premature atrial contraction versus atrial      fibrillation.  She was not anticoagulated previously by her      cardiologist.  We will treat her with Lovenox for deep venous thrombosis      prophylaxis.  We will obtain another EKG tomorrow.  We will restart oral      verapamil.  Hopefully, she will be able to tolerate it.      AVP/MEDQ  D:  08/10/2004  T:  08/10/2004  Job:  [id number]   cc:   Tinnie Gens A. Tawanna Cooler, M.D. [facility]

## 2011-01-13 NOTE — H&P (Signed)
NAME:  [name] [name], [name] [name] NO.:  [id number]   MEDICAL RECORD NO.:  [id number]          PATIENT TYPE:  INP   LOCATION:  1825                         FACILITY:  [facility]   PHYSICIAN:  [name] [name], M.D. LHCDATE OF BIRTH:  19-Mar-1941   DATE OF ADMISSION:  09/02/2004  DATE OF DISCHARGE:                                HISTORY & PHYSICAL   CHIEF COMPLAINT:  Shortness of breath.   HISTORY OF PRESENT ILLNESS:  [name] [name] is a 70 year old woman with a cardiac  history significant for what appears to be paroxysmal atrial fibrillation  and question a second type of supraventricular arrhythmia.  She is on  chronic propafenone therapy.  She is followed by [name] [name] at [facility].  She is also status post dual-chamber pacemaker  placement.  She has no known history of coronary artery disease.  In fact,  she reports having undergone a cardiac catheterization in the spring of 2005  at [facility], which did not show any significant coronary  artery disease.   Patient was admitted by the internal medicine service one month ago with  symptoms of nausea, vomiting, diarrhea, weakness, and shortness of breath.  Of note, she was noted to have cardiomegaly and pleural effusions on chest x-  ray.  She was apparently diuresed and subsequently discharged home.   Patient states that since discharge, she has had continuous shortness of  breath; however, this has been more so over the past several days.  One week  ago, she developed pain in her right shoulder, which has been diagnosed as a  musculoskeletal-type pain.  She actually received a cortisone injection two  days ago, which did help for 24 hours but then the pain recurred.  She  states that the pain is a sharp pain in the right shoulder which radiates  into the neck.  This morning, she developed symptoms of chest discomfort,  which she described as indigestion.  This lasted approximately one hour.  She  took antacids with some relief.  This evening, she had brief recurrence  of her indigestion-type discomfort, but this has since resolved; however,  she has had worsening shortness of breath, which she notices, particularly  with activity, such as walking upstairs.  Based on the progression of these  symptoms, she talked to [name] [name], who advised her presenting to the  emergency room.  On evaluation in the emergency room, she was found to have  elevated cardiac markers, consistent with possible acute coronary syndrome  as well as an elevated B-type natriuretic peptide, consistent with  congestive heart failure.  She was admitted for further evaluation.   The patient denies any previous history of chest pain or known coronary  artery disease.  She has had shortness of breath, as above.  She denies  orthopnea, PND, or significant edema.  She has not had any syncope or  presyncope but has noted palpitations recently.  She states that these  started after her verapamil was stopped one month ago.  Her cardiac risk  factors are positive for a family  history of premature coronary artery  disease.  She has a very remote history of tobacco use.  She denies a  history of diabetes, hypertension, or hyperlipidemia.   PAST MEDICAL HISTORY:  1.  Atrial arrhythmias, included atrial fibrillation, as per HPI.  2.  Status post pacemaker placement approximately one year ago, for      bradycardia.  3.  Gastroesophageal reflux disease.  4.  Depression.   PAST SURGICAL HISTORY:  Include total abdominal hysterectomy and  appendectomy.   CURRENT MEDICATIONS:  1.  Propafenone 225 mg q.d.  2.  Effexor XR 150 mg q.d.  3.  Clonazepam 1 mg q.h.s.  4.  Prevacid 30 mg q.d.  5.  She also has Vicodin p.r.n. and Flexeril p.r.n., which she states she      stopped taking due to increased sedation.   ALLERGIES:  Patient states that she is intolerant of CODEINE and  ANTIHISTAMINES.   SOCIAL HISTORY:  Patient is  married with two children, and she raised her  two grandchildren at home.  She does not work outside the home.   HABITS:  Tobacco:  Previously smoked one pack per day x10 years but quit in  1966.  Alcohol:  No significant use.   FAMILY HISTORY:  Notable for mother who died with an MI at age 71.  Her  daughter died of colon cancer at age 58.  Her mother's siblings also had  significant coronary artery disease.  The patient denies any history of  coronary artery disease in her siblings.   REVIEW OF SYSTEMS:  Patient denies recent symptoms of fevers, chills,  sweats, nausea, or vomiting.  She did have diarrhea several weeks ago and  was seen by a gastroenterologist; however, this has resolved.  She did have  the symptoms of nausea and vomiting with a fever one month ago, but these  have resolved.  She complains of myalgias in the legs and right shoulder  pain but no other arthralgias.  Review of systems is otherwise negative  except as documented above.   PHYSICAL EXAMINATION:  VITAL SIGNS:  Temperature 97, respirations 20, pulse  112 and regular, blood pressure 117/84.  Oxygen saturation is 99%.  GENERAL:  This is a well-appearing elderly woman in no acute distress.  SKIN:  Warm and dry without generalized rash.  HEENT:  Normocephalic and atraumatic.  Eyes are clear.  Sclerae are  anicteric.  Oral mucosa unremarkable.  NECK:  No adenopathy or thyromegaly.  No JVD.  Carotid upstrokes normal  without bruits.  LUNGS:  Clear to auscultation and percussion.  HEART:  PMI is diffuse.  There is a tachycardic regular rate and rhythm.  Normal S1 and S2.  No rub, murmur, or S3 is appreciated.  ABDOMEN:  Soft and nontender without organomegaly.  Normal bowel sounds.  No  bruits.  EXTREMITIES:  No clubbing, cyanosis or edema.  Peripheral pulses are 2+  throughout.  There are no femoral bruits.  EKG today reveals sinus tachycardia at a rate of 112 with a first-degree AV  block.  There is a right  bundle branch block and a left anterior fascicular  block, consistent with bifascicular block.  This is unchanged from previous  EKGs.  Also of note is an EKG from December 14th, which did show what  appears to be atrial fibrillation with a rapid ventricular response and  bifascicular block.  There is also a second EKG from December, which showed  a paced rhythm.  Chest x-ray shows cardiomegaly, which has been present since at least last  month.  There is a question of mild pulmonary congestion.   Other laboratory data is notable for B-type natriuretic peptide elevated at  1190.  Cardiac markers are notable for a CK-MB of 72, troponin I of 6.2 and  myoglobin of greater than 500.  D-dimer is normal at 0.25.  Basic metabolic  panel is normal.  CBC shows a white count of 10.7, hemoglobin 13.5,  hematocrit 40.3, platelets 293,000.   ASSESSMENT:  The patient is a 70 year old woman with a history of atrial  arrhythmias, status post pacemaker placement.  She presents with symptoms of  progressive dyspnea and some indigestion-type chest pain today.  The EKG  does not show any acute ischemic changes; however, the labs are notable for  a markedly elevated BNP as well as cardiac markers.  Differential diagnosis  here includes possible subacute myocarditis with congestive heart failure  versus an acute coronary syndrome associated with congestive heart failure.   PLAN:  1.  Patient will be admitted to the stepdown unit.  Cardiac markers will be      cycled to assess for myocardial injury.  We will also obtain serial      EKGs.  2.  Patient will be started on aspirin, intravenous heparin, and intravenous      nitroglycerin.  She will be given beta blocker as tolerated.  3.  A 2D echocardiogram to assess LV function.  4.  Right and left heart catheterization to assess her hemodynamics and      coronary anatomy.  5.  Regarding her history of atrial arrhythmia, we will hold her propafenone      at  this time in the presence of evidence of congestive heart failure.      Mark   MWP/MEDQ  D:  09/02/2004  T:  09/02/2004  Job:  [id number]   cc:   Tinnie Gens A. Tawanna [name], M.D. [facility]

## 2011-06-08 LAB — CK TOTAL AND CKMB (NOT AT ARMC)
CK, MB: 2.3
CK, MB: 2.7
CK, MB: 3.4
CK, MB: UNDETERMINED
Relative Index: INVALID
Relative Index: INVALID
Relative Index: INVALID
Relative Index: INVALID
Total CK: 57
Total CK: 59
Total CK: 63
Total CK: UNDETERMINED

## 2011-06-08 LAB — URINALYSIS, ROUTINE W REFLEX MICROSCOPIC
Bilirubin Urine: NEGATIVE
Glucose, UA: NEGATIVE
Ketones, ur: NEGATIVE
Leukocytes, UA: NEGATIVE
Nitrite: NEGATIVE
Protein, ur: NEGATIVE
Specific Gravity, Urine: 1.01
Urobilinogen, UA: 0.2
pH: 5.5

## 2011-06-08 LAB — B-NATRIURETIC PEPTIDE (CONVERTED LAB)
Pro B Natriuretic peptide (BNP): 611 — ABNORMAL HIGH
Pro B Natriuretic peptide (BNP): 618 — ABNORMAL HIGH

## 2011-06-08 LAB — HEMOGLOBIN A1C
Hgb A1c MFr Bld: 7.1 — ABNORMAL HIGH
Mean Plasma Glucose: 175

## 2011-06-08 LAB — DIFFERENTIAL
Basophils Absolute: 0
Basophils Relative: 0
Eosinophils Absolute: 0.2
Eosinophils Relative: 2
Lymphocytes Relative: 16
Lymphs Abs: 1.9
Monocytes Absolute: 1.2 — ABNORMAL HIGH
Monocytes Relative: 10
Neutro Abs: 8.9 — ABNORMAL HIGH
Neutrophils Relative %: 72

## 2011-06-08 LAB — CBC
HCT: 36.7
HCT: 37.2
Hemoglobin: 12.1
Hemoglobin: 12.3
MCHC: 32.9
MCHC: 33
MCV: 84.1
MCV: 84.4
Platelets: 242
Platelets: 268
RBC: 4.36
RBC: 4.41
RDW: 15.4 — ABNORMAL HIGH
RDW: 15.6 — ABNORMAL HIGH
WBC: 12.3 — ABNORMAL HIGH
WBC: 8.5

## 2011-06-08 LAB — POCT CARDIAC MARKERS
CKMB, poc: 1.2
Myoglobin, poc: 79.8
Operator id: 282201
Troponin i, poc: <0.05

## 2011-06-08 LAB — COMPREHENSIVE METABOLIC PANEL
ALT: 18
AST: 24
Albumin: 3.2 — ABNORMAL LOW
Alkaline Phosphatase: 64
BUN: 13
CO2: 24
Calcium: 8.8
Chloride: 104
Creatinine, Ser: 1.19
GFR calc Af Amer: 55 — ABNORMAL LOW
GFR calc non Af Amer: 45 — ABNORMAL LOW
Glucose, Bld: 113 — ABNORMAL HIGH
Potassium: 3.7
Sodium: 138
Total Bilirubin: 0.8
Total Protein: 6.5

## 2011-06-08 LAB — BASIC METABOLIC PANEL
BUN: 15
CO2: 26
Calcium: 8.6
Chloride: 107
Creatinine, Ser: 1.31 — ABNORMAL HIGH
GFR calc Af Amer: 49 — ABNORMAL LOW
GFR calc non Af Amer: 41 — ABNORMAL LOW
Glucose, Bld: 106 — ABNORMAL HIGH
Potassium: 3.8
Sodium: 141

## 2011-06-08 LAB — HEMOGLOBIN: Hemoglobin: 12.4

## 2011-06-08 LAB — TSH: TSH: 4.091

## 2011-06-08 LAB — URINE MICROSCOPIC-ADD ON

## 2011-06-08 LAB — DIGOXIN LEVEL: Digoxin Level: 0.6 — ABNORMAL LOW

## 2011-06-08 LAB — TROPONIN I
Troponin I: 0.09 — ABNORMAL HIGH
Troponin I: 0.12 — ABNORMAL HIGH

## 2011-11-10 ENCOUNTER — Other Ambulatory Visit: Payer: Self-pay | Admitting: Gastroenterology

## 2012-09-02 ENCOUNTER — Ambulatory Visit: Payer: Medicare Other

## 2012-09-05 ENCOUNTER — Ambulatory Visit: Payer: Medicare Other | Attending: Pediatric Infectious Disease

## 2012-09-05 DIAGNOSIS — R5381 Other malaise: Secondary | ICD-10-CM | POA: Insufficient documentation

## 2012-09-05 DIAGNOSIS — IMO0001 Reserved for inherently not codable concepts without codable children: Secondary | ICD-10-CM | POA: Insufficient documentation

## 2012-09-05 DIAGNOSIS — M6281 Muscle weakness (generalized): Secondary | ICD-10-CM | POA: Insufficient documentation

## 2012-10-01 ENCOUNTER — Other Ambulatory Visit (HOSPITAL_COMMUNITY): Payer: Self-pay | Admitting: Gastroenterology

## 2012-10-01 DIAGNOSIS — R1011 Right upper quadrant pain: Secondary | ICD-10-CM

## 2012-10-01 DIAGNOSIS — R109 Unspecified abdominal pain: Secondary | ICD-10-CM

## 2012-10-08 ENCOUNTER — Ambulatory Visit: Payer: Medicare Other | Attending: Pediatric Infectious Disease

## 2012-10-08 DIAGNOSIS — R5381 Other malaise: Secondary | ICD-10-CM | POA: Insufficient documentation

## 2012-10-08 DIAGNOSIS — M6281 Muscle weakness (generalized): Secondary | ICD-10-CM | POA: Insufficient documentation

## 2012-10-08 DIAGNOSIS — IMO0001 Reserved for inherently not codable concepts without codable children: Secondary | ICD-10-CM | POA: Insufficient documentation

## 2012-10-09 ENCOUNTER — Ambulatory Visit: Payer: Medicare Other

## 2012-10-10 ENCOUNTER — Encounter (HOSPITAL_COMMUNITY): Payer: Medicare Other

## 2012-10-15 ENCOUNTER — Ambulatory Visit: Payer: Medicare Other

## 2012-10-17 ENCOUNTER — Ambulatory Visit: Payer: Medicare Other

## 2012-10-18 ENCOUNTER — Encounter (HOSPITAL_COMMUNITY)
Admission: RE | Admit: 2012-10-18 | Discharge: 2012-10-18 | Disposition: A | Discharge status: Home / Self Care | Payer: Medicare Other | Source: Ambulatory Visit | Attending: Gastroenterology | Admitting: Gastroenterology

## 2012-10-18 DIAGNOSIS — R1011 Right upper quadrant pain: Secondary | ICD-10-CM | POA: Insufficient documentation

## 2012-10-18 DIAGNOSIS — R109 Unspecified abdominal pain: Secondary | ICD-10-CM | POA: Insufficient documentation

## 2012-10-18 MED ORDER — TECHNETIUM TC 99M MEBROFENIN IV KIT
5.0000 | PACK | Freq: Once | INTRAVENOUS | Status: AC | PRN
  Administered 2012-10-18: 5 via INTRAVENOUS

## 2012-10-22 ENCOUNTER — Ambulatory Visit: Payer: Medicare Other

## 2012-10-24 ENCOUNTER — Ambulatory Visit: Payer: Medicare Other | Admitting: Physical Therapy

## 2012-10-29 ENCOUNTER — Ambulatory Visit: Payer: Medicare Other | Attending: Pediatric Infectious Disease

## 2012-10-29 DIAGNOSIS — IMO0001 Reserved for inherently not codable concepts without codable children: Secondary | ICD-10-CM | POA: Insufficient documentation

## 2012-10-29 DIAGNOSIS — R5381 Other malaise: Secondary | ICD-10-CM | POA: Insufficient documentation

## 2012-10-29 DIAGNOSIS — M6281 Muscle weakness (generalized): Secondary | ICD-10-CM | POA: Insufficient documentation

## 2012-10-31 ENCOUNTER — Ambulatory Visit: Payer: Medicare Other

## 2012-11-05 ENCOUNTER — Ambulatory Visit: Payer: Medicare Other

## 2012-11-07 ENCOUNTER — Ambulatory Visit: Payer: Medicare Other

## 2012-11-11 ENCOUNTER — Ambulatory Visit: Payer: Medicare Other

## 2012-11-12 ENCOUNTER — Ambulatory Visit: Payer: Medicare Other

## 2012-11-19 ENCOUNTER — Ambulatory Visit: Payer: Medicare Other

## 2012-11-19 ENCOUNTER — Ambulatory Visit: Payer: Medicare Other | Attending: Internal Medicine

## 2012-11-19 DIAGNOSIS — IMO0001 Reserved for inherently not codable concepts without codable children: Secondary | ICD-10-CM | POA: Insufficient documentation

## 2012-11-19 DIAGNOSIS — R5381 Other malaise: Secondary | ICD-10-CM | POA: Insufficient documentation

## 2012-11-19 DIAGNOSIS — M6281 Muscle weakness (generalized): Secondary | ICD-10-CM | POA: Insufficient documentation

## 2012-11-21 ENCOUNTER — Encounter: Payer: Medicare Other | Admitting: Physical Therapy

## 2012-11-21 ENCOUNTER — Ambulatory Visit: Payer: Medicare Other | Admitting: Physical Therapy

## 2012-11-26 ENCOUNTER — Ambulatory Visit: Payer: Medicare Other | Attending: Internal Medicine | Admitting: Physical Therapy

## 2012-11-26 DIAGNOSIS — M6281 Muscle weakness (generalized): Secondary | ICD-10-CM | POA: Insufficient documentation

## 2012-11-26 DIAGNOSIS — IMO0001 Reserved for inherently not codable concepts without codable children: Secondary | ICD-10-CM | POA: Insufficient documentation

## 2012-11-26 DIAGNOSIS — R5381 Other malaise: Secondary | ICD-10-CM | POA: Insufficient documentation

## 2012-11-27 ENCOUNTER — Ambulatory Visit (HOSPITAL_COMMUNITY)
Admission: RE | Admit: 2012-11-27 | Discharge: 2012-11-27 | Disposition: A | Discharge status: Home / Self Care | Payer: Medicare Other | Source: Ambulatory Visit | Attending: Internal Medicine | Admitting: Internal Medicine

## 2012-11-27 ENCOUNTER — Other Ambulatory Visit (HOSPITAL_COMMUNITY): Payer: Self-pay | Admitting: Internal Medicine

## 2012-11-27 DIAGNOSIS — M25579 Pain in unspecified ankle and joints of unspecified foot: Secondary | ICD-10-CM | POA: Insufficient documentation

## 2012-11-27 DIAGNOSIS — M25571 Pain in right ankle and joints of right foot: Secondary | ICD-10-CM

## 2012-11-27 DIAGNOSIS — S99929A Unspecified injury of unspecified foot, initial encounter: Secondary | ICD-10-CM | POA: Insufficient documentation

## 2012-11-27 DIAGNOSIS — X58XXXA Exposure to other specified factors, initial encounter: Secondary | ICD-10-CM | POA: Insufficient documentation

## 2012-11-27 DIAGNOSIS — S99919A Unspecified injury of unspecified ankle, initial encounter: Secondary | ICD-10-CM | POA: Insufficient documentation

## 2012-11-27 DIAGNOSIS — S8990XA Unspecified injury of unspecified lower leg, initial encounter: Secondary | ICD-10-CM | POA: Insufficient documentation

## 2012-11-27 DIAGNOSIS — M79609 Pain in unspecified limb: Secondary | ICD-10-CM | POA: Insufficient documentation

## 2012-11-27 DIAGNOSIS — M7730 Calcaneal spur, unspecified foot: Secondary | ICD-10-CM | POA: Insufficient documentation

## 2012-11-28 ENCOUNTER — Ambulatory Visit: Payer: Medicare Other

## 2012-11-29 ENCOUNTER — Other Ambulatory Visit (HOSPITAL_COMMUNITY): Payer: Self-pay | Admitting: Podiatry

## 2012-11-29 ENCOUNTER — Ambulatory Visit (HOSPITAL_COMMUNITY)
Admission: RE | Admit: 2012-11-29 | Discharge: 2012-11-29 | Disposition: A | Discharge status: Home / Self Care | Payer: Medicare Other | Source: Ambulatory Visit | Attending: Podiatry | Admitting: Podiatry

## 2012-11-29 DIAGNOSIS — M25579 Pain in unspecified ankle and joints of unspecified foot: Secondary | ICD-10-CM | POA: Insufficient documentation

## 2012-11-29 DIAGNOSIS — M25571 Pain in right ankle and joints of right foot: Secondary | ICD-10-CM

## 2012-11-29 NOTE — Progress Notes (Signed)
Right Lower Ext. Venous Duplex Completed. Negative for DVT. [name]  

## 2012-12-03 ENCOUNTER — Ambulatory Visit: Payer: Medicare Other

## 2012-12-05 ENCOUNTER — Ambulatory Visit: Payer: Medicare Other

## 2012-12-10 ENCOUNTER — Ambulatory Visit: Payer: Medicare Other

## 2012-12-10 ENCOUNTER — Ambulatory Visit: Payer: Medicare Other | Attending: Orthopaedic Surgery

## 2012-12-10 DIAGNOSIS — R5381 Other malaise: Secondary | ICD-10-CM | POA: Insufficient documentation

## 2012-12-10 DIAGNOSIS — R609 Edema, unspecified: Secondary | ICD-10-CM | POA: Insufficient documentation

## 2012-12-10 DIAGNOSIS — IMO0001 Reserved for inherently not codable concepts without codable children: Secondary | ICD-10-CM | POA: Insufficient documentation

## 2012-12-10 DIAGNOSIS — R269 Unspecified abnormalities of gait and mobility: Secondary | ICD-10-CM | POA: Insufficient documentation

## 2012-12-10 DIAGNOSIS — M25579 Pain in unspecified ankle and joints of unspecified foot: Secondary | ICD-10-CM | POA: Insufficient documentation

## 2012-12-10 DIAGNOSIS — R262 Difficulty in walking, not elsewhere classified: Secondary | ICD-10-CM | POA: Insufficient documentation

## 2012-12-12 ENCOUNTER — Ambulatory Visit: Payer: Medicare Other

## 2012-12-17 ENCOUNTER — Ambulatory Visit: Payer: Medicare Other

## 2012-12-19 ENCOUNTER — Ambulatory Visit: Payer: Medicare Other

## 2012-12-24 ENCOUNTER — Ambulatory Visit: Payer: Medicare Other

## 2012-12-26 ENCOUNTER — Ambulatory Visit: Payer: Medicare Other

## 2012-12-31 ENCOUNTER — Ambulatory Visit: Payer: Medicare Other | Attending: Orthopaedic Surgery

## 2012-12-31 DIAGNOSIS — IMO0001 Reserved for inherently not codable concepts without codable children: Secondary | ICD-10-CM | POA: Insufficient documentation

## 2012-12-31 DIAGNOSIS — R269 Unspecified abnormalities of gait and mobility: Secondary | ICD-10-CM | POA: Insufficient documentation

## 2012-12-31 DIAGNOSIS — R5381 Other malaise: Secondary | ICD-10-CM | POA: Insufficient documentation

## 2012-12-31 DIAGNOSIS — M25579 Pain in unspecified ankle and joints of unspecified foot: Secondary | ICD-10-CM | POA: Insufficient documentation

## 2012-12-31 DIAGNOSIS — R262 Difficulty in walking, not elsewhere classified: Secondary | ICD-10-CM | POA: Insufficient documentation

## 2012-12-31 DIAGNOSIS — R609 Edema, unspecified: Secondary | ICD-10-CM | POA: Insufficient documentation

## 2013-01-02 ENCOUNTER — Ambulatory Visit: Payer: Medicare Other | Admitting: Physical Therapy

## 2013-07-10 ENCOUNTER — Other Ambulatory Visit: Payer: Self-pay | Admitting: Dermatology

## 2013-08-18 ENCOUNTER — Other Ambulatory Visit: Payer: Self-pay | Admitting: Gastroenterology

## 2013-08-18 DIAGNOSIS — R1310 Dysphagia, unspecified: Secondary | ICD-10-CM

## 2013-08-26 ENCOUNTER — Ambulatory Visit
Admission: RE | Admit: 2013-08-26 | Discharge: 2013-08-26 | Disposition: A | Discharge status: Home / Self Care | Payer: Medicare Other | Source: Ambulatory Visit | Attending: Gastroenterology | Admitting: Gastroenterology

## 2013-08-26 DIAGNOSIS — R1310 Dysphagia, unspecified: Secondary | ICD-10-CM

## 2013-12-03 ENCOUNTER — Other Ambulatory Visit (HOSPITAL_COMMUNITY): Payer: Self-pay | Admitting: Internal Medicine

## 2013-12-03 DIAGNOSIS — Z1231 Encounter for screening mammogram for malignant neoplasm of breast: Secondary | ICD-10-CM

## 2013-12-04 ENCOUNTER — Ambulatory Visit (HOSPITAL_COMMUNITY)
Admission: RE | Admit: 2013-12-04 | Discharge: 2013-12-04 | Disposition: A | Discharge status: Home / Self Care | Payer: Medicare Other | Source: Ambulatory Visit | Attending: Internal Medicine | Admitting: Internal Medicine

## 2013-12-04 DIAGNOSIS — Z1231 Encounter for screening mammogram for malignant neoplasm of breast: Secondary | ICD-10-CM | POA: Insufficient documentation

## 2014-07-01 ENCOUNTER — Other Ambulatory Visit: Payer: Self-pay | Admitting: Dermatology

## 2014-11-04 ENCOUNTER — Other Ambulatory Visit (HOSPITAL_COMMUNITY): Payer: Self-pay | Admitting: Internal Medicine

## 2014-11-04 ENCOUNTER — Ambulatory Visit (HOSPITAL_COMMUNITY): Admission: RE | Admit: 2014-11-04 | Payer: Medicare Other | Source: Ambulatory Visit

## 2014-12-21 ENCOUNTER — Encounter: Payer: Self-pay | Admitting: Neurology

## 2014-12-21 ENCOUNTER — Ambulatory Visit (INDEPENDENT_AMBULATORY_CARE_PROVIDER_SITE_OTHER): Payer: Medicare Other | Admitting: Neurology

## 2014-12-21 VITALS — BP 104/60 | HR 58 | Resp 16 | Ht 64.0 in | Wt 172.0 lb

## 2014-12-21 DIAGNOSIS — R51 Headache: Secondary | ICD-10-CM

## 2014-12-21 DIAGNOSIS — G4733 Obstructive sleep apnea (adult) (pediatric): Secondary | ICD-10-CM

## 2014-12-21 DIAGNOSIS — G4719 Other hypersomnia: Secondary | ICD-10-CM

## 2014-12-21 DIAGNOSIS — R519 Headache, unspecified: Secondary | ICD-10-CM

## 2014-12-21 DIAGNOSIS — E663 Overweight: Secondary | ICD-10-CM | POA: Diagnosis not present

## 2014-12-21 DIAGNOSIS — Z941 Heart transplant status: Secondary | ICD-10-CM

## 2014-12-21 NOTE — Patient Instructions (Signed)

## 2014-12-21 NOTE — Progress Notes (Addendum)
Subjective:    Patient ID: [name] [name] is a 74 y.o. female.  HPI     Huston Foley, MD, PhD [facility] [address] [address] [place], [place] [address]  Dear Dr. Clelia Croft,    I saw your patient, [name] [name], upon your kind request in my neurologic clinic today for initial consultation of her sleep disorder, in particular, concern for underlying obstructive sleep apnea. The patient is unaccompanied today. As you know, 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 Reports of her sleep studies are not available for my review today. She had a sleep study in 2010 which apparently did show obstructive sleep apnea and she was advised to start using CPAP. She had another sleep study in February 2014 which showed no significant obstructive sleep apnea, but evidence of REM related obstructive sleep apnea for which she was advised to lose 10-15 pounds. She lives with her husband, but they both sleep in separate bedrooms due his and her snoring. She smoked remotely for about 10 years (age 25 to 16). She does not drink alcohol. She drinks diet cola about 4 cans per day, as late as 7 PM.  She goes to bed around MN and likes read in bed. She does not watch TV in bed. She wakes up as late as 11 AM, but does not wake up rested and does not want to get up.  Her ESS is 9/24 and fatigue score is 59. Since her heart transplant she has gained about 20 lb. She is trying to walk 30 minutes a day.   She denies RLS symptoms and is not known to twitch in her sleep or cake her legs. She denies parasomnias. She would be willing to try CPAP again. In the past, eventually, when she tried a nasal pillows interface she was able to tolerated. Nevertheless, she found the CPAP machine very loud as well. She reports occasional morning headaches.  Her Past Medical History Is Significant For: Past Medical History  Diagnosis Date  . CHF (congestive heart failure)   . Pacemaker   . A-fib   . GERD (gastroesophageal reflux disease)   . Depression   . Osteoporosis   . Hyperparathyroidism     Her Past Surgical History Is Significant For: Past Surgical History  Procedure Laterality Date  . Pacemaker insertion  2005  . Ablation  2007  . Total abdominal hysterectomy w/ bilateral salpingoophorectomy    . Ablation on endometriosis    . Heart transplant  2009  . Cardiac catheterization  2009    Her Family History Is Significant For: Family History  Problem Relation Age of Onset  . Heart attack Mother   . Colon cancer Father   . Heart attack Maternal Grandmother   . Heart attack Paternal Grandmother   . Diabetes type II Paternal Grandmother   . OCD Daughter     Her Social History Is Significant For: History   Social History  . Marital Status: Married    Spouse Name: N/A  . Number of Children: 4  . Years of  Education: [employer]   Occupational History  . Retired     Social History Main Topics  . Smoking status: Former Games developermoker  . Smokeless tobacco: Not on file     Comment: Quit 1960  . Alcohol Use: No  . Drug Use: No  . Sexual Activity: Not on file   Other Topics Concern  . None   Social History Narrative   3 caffeine drinks a day     Her Allergies Are:  Allergies  Allergen Reactions  . Antihistamines, Chlorpheniramine-Type Other (See Comments)    Numbness  . Codeine Nausea Only  :   Her Current Medications Are:  Outpatient Encounter Prescriptions as of 12/21/2014   Medication Sig  . aspirin 81 MG tablet Take 81 mg by mouth daily.  . clonazePAM (KLONOPIN) 1 MG tablet Take 1 mg by mouth daily.  Marland Kitchen. conjugated estrogens (PREMARIN) vaginal cream Place 1 Applicatorful vaginally daily.  . cyanocobalamin 500 MCG tablet Take 500 mcg by mouth daily.  Marland Kitchen. escitalopram (LEXAPRO) 20 MG tablet Take 20 mg by mouth daily.  Marland Kitchen. esomeprazole (NEXIUM) 20 MG capsule Take 20 mg by mouth daily at 12 noon.  . iron polysaccharides (NIFEREX) 150 MG capsule Take 150 mg by mouth daily.  Marland Kitchen. levothyroxine (SYNTHROID, LEVOTHROID) 50 MCG tablet Take 50 mcg by mouth daily before breakfast.  . losartan (COZAAR) 50 MG tablet Take 50 mg by mouth 2 (two) times daily.  . magnesium oxide (MAG-OX) 400 MG tablet Take 400 mg by mouth daily.  . mycophenolate (CELLCEPT) 250 MG capsule Take by mouth 2 (two) times daily.  . tacrolimus (PROGRAF) 0.5 MG capsule Take 0.5 mg by mouth 2 (two) times daily.  . Vitamin D, Ergocalciferol, (DRISDOL) 50000 UNITS CAPS capsule Take 50,000 Units by mouth every 7 (seven) days.  . Zoledronic Acid (RECLAST IV) Inject into the vein.  . [DISCONTINUED] ondansetron (ZOFRAN) 4 MG tablet Take 4 mg by mouth every 8 (eight) hours as needed for nausea or vomiting.  :  Review of Systems:  Out of a complete 14 point review of systems, all are reviewed and negative with the exception of these symptoms as listed below:   Review of Systems  Constitutional: Positive for fatigue.       Weight gain   Respiratory: Positive for shortness of breath.        Snoring   Neurological:       Sleepiness, Sometimes has a hard time falling asleep, snoring reported, 2x woke up gasping for air or choking, wakes up feeling tired in the morning, occasionally takes a late nap during the day.   Psychiatric/Behavioral:       Depression, Too much sleep, Decreased energy     Objective:  Neurologic Exam  Physical Exam Physical Examination:   Filed Vitals:   12/21/14 1410  BP: 104/60   Pulse: 58  Resp: 16    General Examination: The patient is a very pleasant 74 y.o. female in no acute distress. She appears well-developed and well-nourished and well groomed. She is obese.  HEENT: Normocephalic, atraumatic, pupils are equal, round and reactive to light and accommodation. Funduscopic exam is normal with sharp disc margins noted. Extraocular tracking is good without limitation to gaze excursion or nystagmus noted. Normal smooth pursuit is noted. Hearing is grossly intact. Tympanic membranes are clear bilaterally. Face is symmetric with normal facial animation and normal facial sensation. Speech is clear with no dysarthria noted. There is no hypophonia. There is no lip, neck/head, jaw or  voice tremor. Neck is supple with full range of passive and active motion. There are no carotid bruits on auscultation. Oropharynx exam reveals: mild mouth dryness, good dental hygiene and moderate airway crowding, due to narrow airway entry and redundant soft palate. Tonsils appear small. Tongue appears normal.. Mallampati is class II. Tongue protrudes centrally and palate elevates symmetrically.   Chest: Clear to auscultation without wheezing, rhonchi or crackles noted.  Heart: S1+S2+0, regular and normal without murmurs, rubs or gallops noted.   Abdomen: Soft, non-tender and non-distended with normal bowel sounds appreciated on auscultation.  Extremities: There is no pitting edema in the distal lower extremities bilaterally. Pedal pulses are intact.  Skin: Warm and dry without trophic changes noted. There are no varicose veins.  Musculoskeletal: exam reveals no obvious joint deformities, tenderness or joint swelling or erythema.   Neurologically:  Mental status: The patient is awake, alert and oriented in all 4 spheres. Her immediate and remote memory, attention, language skills and fund of knowledge are appropriate. There is no evidence of aphasia, agnosia, apraxia or anomia. Speech is clear  with normal prosody and enunciation. Thought process is linear. Mood is normal and affect is normal.  Cranial nerves II - XII are as described above under HEENT exam. In addition: shoulder shrug is normal with equal shoulder height noted. Motor exam: Normal bulk, strength and tone is noted. There is no drift, tremor or rebound. Romberg is negative. Reflexes are 2+ throughout. Babinski: Toes are flexor bilaterally. Fine motor skills and coordination: intact with normal finger taps, normal hand movements, normal rapid alternating patting, normal foot taps and normal foot agility.  Cerebellar testing: No dysmetria or intention tremor on finger to nose testing. Heel to shin is unremarkable bilaterally. There is no truncal or gait ataxia.  Sensory exam: intact to light touch, pinprick, vibration, temperature sense in the upper and lower extremities.  Gait, station and balance: She stands easily. No veering to one side is noted. No leaning to one side is noted. Posture is age-appropriate and stance is narrow based. Gait shows normal stride length and normal pace. No problems turning are noted. She turns en bloc. Tandem walk is unremarkable.   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 Her history and physical exam are concerning for underlying obstructive sleep apnea (OSA). I had a long chat with the patient about my findings and the diagnosis of OSA, its prognosis and treatment options.  We talked about medical treatments, surgical interventions and non-pharmacological approaches. I explained in particular the risks and ramifications of untreated moderate to severe OSA, especially with respect to developing cardiovascular disease down the Road, including congestive heart failure, difficult to treat hypertension, cardiac arrhythmias, or stroke. Even type 2 diabetes has, in part, been linked to untreated OSA. Symptoms of untreated OSA include daytime sleepiness, memory problems, mood irritability and mood disorder such as depression and anxiety, lack of energy, as well as recurrent headaches, especially morning headaches. We talked about trying to maintain a healthy lifestyle in general, as well as the importance of  weight control. I encouraged the patient to eat healthy, exercise daily and keep well hydrated, to keep a scheduled bedtime and wake time routine, to not skip any meals and eat healthy snacks in between meals. I advised the patient not to drive when feeling sleepy. She is furthermore advised to try to reduce her caffeine intake. She is commended on trying to exercise regularly.  I recommended the following at this time: sleep study with potential positive airway pressure titration. (We will score hypopneas at 4% and split the sleep study into diagnostic and treatment portion, if the estimated. 2 hour AHI is >15/h).   I explained the sleep test procedure to the patient and also outlined possible surgical and non-surgical treatment options of OSA, including the use of a custom-made dental device (which would require a referral to a specialist dentist or oral surgeon), upper airway surgical options, such as pillar implants, radiofrequency surgery, tongue base surgery, and UPPP (which would involve a referral to an ENT surgeon). Rarely, jaw surgery such as mandibular advancement may be considered.  I also explained the CPAP treatment option to the patient, who indicated that she would be  willing to try CPAP if the need arises. I explained the importance of being compliant with PAP treatment, not only for insurance purposes but primarily to improve Her symptoms, and for the patient's long term health benefit, including to reduce Her cardiovascular risks. I answered all her questions today and the patient was in agreement. I would like to see her back after the sleep study is completed and encouraged her to call with any interim questions, concerns, problems or updates.   Thank you very much for allowing me to participate in the care of this nice patient. If I can be of any further assistance to you please do not hesitate to call me at [phone].  Sincerely,   Huston Foley, MD, PhD   12/22/2014: I received sleep study test results from [facility]: The patient had a split-night sleep study on 04/07/2010: Her sleep efficiency was 64.5%. Sleep latency was 55 minutes. She had absence of REM sleep. Total AHI was 31.2 per hour. Her oxyhemoglobin desaturation nadir was 90%. She was titrated on CPAP from 4-10 cm. She had an AHI of 1.3 per hour on a pressure of 10 cm and advised to start treatment with 10 cm of water pressure.  She had a baseline sleep study on 10/02/2012: Sleep efficiency was 74.4%. She had absence of slow-wave sleep and 4.5% of REM sleep with a prolonged REM latency of 260 minutes. Total AHI was 3.5 per hour, REM AHI was 14.4 per hour. Average oxygen saturation was 92%, nadir was 88%. She had a PLM index of 5.4 per hour. She was advised to follow-up with the referring physician based on the sleep test results.

## 2014-12-23 ENCOUNTER — Encounter: Payer: Self-pay | Admitting: Neurology

## 2014-12-30 ENCOUNTER — Ambulatory Visit (INDEPENDENT_AMBULATORY_CARE_PROVIDER_SITE_OTHER): Payer: Medicare Other | Admitting: Neurology

## 2014-12-30 VITALS — BP 137/78 | HR 66 | Ht 64.0 in | Wt 172.0 lb

## 2014-12-30 DIAGNOSIS — G4733 Obstructive sleep apnea (adult) (pediatric): Secondary | ICD-10-CM

## 2014-12-30 DIAGNOSIS — G4720 Circadian rhythm sleep disorder, unspecified type: Secondary | ICD-10-CM

## 2014-12-30 DIAGNOSIS — R9431 Abnormal electrocardiogram [ECG] [EKG]: Secondary | ICD-10-CM

## 2014-12-30 DIAGNOSIS — G479 Sleep disorder, unspecified: Secondary | ICD-10-CM

## 2014-12-31 NOTE — Sleep Study (Signed)
See Scanned documents  In Encounters tab

## 2015-01-07 ENCOUNTER — Ambulatory Visit (HOSPITAL_COMMUNITY): Admission: RE | Admit: 2015-01-07 | Payer: Medicare Other | Source: Ambulatory Visit

## 2015-01-13 ENCOUNTER — Telehealth: Payer: Self-pay | Admitting: Neurology

## 2015-01-13 DIAGNOSIS — G4733 Obstructive sleep apnea (adult) (pediatric): Secondary | ICD-10-CM

## 2015-01-13 NOTE — Telephone Encounter (Signed)
Pt seen on 12/21/14 and PSG on 12/30/14.   Lafonda MossesDiana:   Please call and notify the patient that the recent sleep study did confirm the diagnosis of obstructive sleep apnea and that I recommend treatment for this in the form of CPAP. This will require a repeat sleep study for proper titration and mask fitting. Unfortunately, she did not sleep enough for us to try CPAP during this last sleep study.   Please explain to patient and arrange for a CPAP titration study. I have placed an order in the chart. Thanks,  Huston FoleySaima Harvy Riera, MD, PhD [facility]

## 2015-01-14 NOTE — Telephone Encounter (Signed)
I spoke with patient. She is aware of results. She wants to proceed but would like to wait for now. Patient reports having some extreme weakness and has been to the doctor for it. Unknown origin of weakness. [name] [name] states that she will call back when she is feeling better.   [name]: Patient asks if we can go ahead and let her know if insurance has approved this.

## 2015-02-02 ENCOUNTER — Telehealth: Payer: Self-pay | Admitting: Neurology

## 2015-02-02 NOTE — Telephone Encounter (Signed)
Pt called office requesting that a copy of her sleep test results be sent to Dr Horald ChestnutBarbara Pisani, phone:[phone] [phone]. Please contact pt when request is reviewed.

## 2015-02-02 NOTE — Telephone Encounter (Signed)
I spoke to patient and she is aware that we faxed PSG to cardiologist.

## 2015-03-03 ENCOUNTER — Other Ambulatory Visit (HOSPITAL_COMMUNITY): Payer: Self-pay | Admitting: Internal Medicine

## 2015-03-11 ENCOUNTER — Encounter (HOSPITAL_COMMUNITY)
Admission: RE | Admit: 2015-03-11 | Discharge: 2015-03-11 | Disposition: A | Discharge status: Home / Self Care | Payer: Medicare Other | Source: Ambulatory Visit | Attending: Internal Medicine | Admitting: Internal Medicine

## 2015-03-11 MED ORDER — TECHNETIUM TC 99M SESTAMIBI - CARDIOLITE
26.6000 | Freq: Once | INTRAVENOUS | Status: AC | PRN
  Administered 2015-03-11: 10:00:00 27 via INTRAVENOUS

## 2015-07-19 ENCOUNTER — Other Ambulatory Visit: Payer: Self-pay

## 2015-07-19 DIAGNOSIS — Z1231 Encounter for screening mammogram for malignant neoplasm of breast: Secondary | ICD-10-CM

## 2015-08-26 ENCOUNTER — Ambulatory Visit
Admission: RE | Admit: 2015-08-26 | Discharge: 2015-08-26 | Disposition: A | Discharge status: Home / Self Care | Payer: Medicare Other | Source: Ambulatory Visit

## 2015-08-26 DIAGNOSIS — Z1231 Encounter for screening mammogram for malignant neoplasm of breast: Secondary | ICD-10-CM

## 2015-10-16 DIAGNOSIS — Z941 Heart transplant status: Secondary | ICD-10-CM | POA: Diagnosis not present

## 2015-10-18 DIAGNOSIS — Z941 Heart transplant status: Secondary | ICD-10-CM | POA: Diagnosis not present

## 2015-10-18 DIAGNOSIS — I82401 Acute embolism and thrombosis of unspecified deep veins of right lower extremity: Secondary | ICD-10-CM | POA: Diagnosis not present

## 2015-10-18 DIAGNOSIS — I824Y2 Acute embolism and thrombosis of unspecified deep veins of left proximal lower extremity: Secondary | ICD-10-CM | POA: Diagnosis not present

## 2015-10-20 DIAGNOSIS — G4733 Obstructive sleep apnea (adult) (pediatric): Secondary | ICD-10-CM | POA: Diagnosis not present

## 2015-10-20 DIAGNOSIS — I509 Heart failure, unspecified: Secondary | ICD-10-CM | POA: Diagnosis not present

## 2015-10-20 DIAGNOSIS — Z4821 Encounter for aftercare following heart transplant: Secondary | ICD-10-CM | POA: Diagnosis not present

## 2015-10-20 DIAGNOSIS — I110 Hypertensive heart disease with heart failure: Secondary | ICD-10-CM | POA: Diagnosis not present

## 2015-10-20 DIAGNOSIS — I10 Essential (primary) hypertension: Secondary | ICD-10-CM | POA: Diagnosis not present

## 2015-10-20 DIAGNOSIS — I82403 Acute embolism and thrombosis of unspecified deep veins of lower extremity, bilateral: Secondary | ICD-10-CM | POA: Diagnosis not present

## 2015-10-20 DIAGNOSIS — Z941 Heart transplant status: Secondary | ICD-10-CM | POA: Diagnosis not present

## 2015-10-20 DIAGNOSIS — E213 Hyperparathyroidism, unspecified: Secondary | ICD-10-CM | POA: Diagnosis not present

## 2015-10-20 DIAGNOSIS — Z885 Allergy status to narcotic agent status: Secondary | ICD-10-CM | POA: Diagnosis not present

## 2015-10-20 DIAGNOSIS — E039 Hypothyroidism, unspecified: Secondary | ICD-10-CM | POA: Diagnosis not present

## 2015-10-20 DIAGNOSIS — Z79899 Other long term (current) drug therapy: Secondary | ICD-10-CM | POA: Diagnosis not present

## 2015-10-20 DIAGNOSIS — I701 Atherosclerosis of renal artery: Secondary | ICD-10-CM | POA: Diagnosis not present

## 2015-11-10 DIAGNOSIS — R911 Solitary pulmonary nodule: Secondary | ICD-10-CM | POA: Diagnosis not present

## 2015-11-10 DIAGNOSIS — I110 Hypertensive heart disease with heart failure: Secondary | ICD-10-CM | POA: Diagnosis not present

## 2015-11-10 DIAGNOSIS — Z885 Allergy status to narcotic agent status: Secondary | ICD-10-CM | POA: Diagnosis not present

## 2015-11-10 DIAGNOSIS — I509 Heart failure, unspecified: Secondary | ICD-10-CM | POA: Diagnosis not present

## 2015-11-10 DIAGNOSIS — Z888 Allergy status to other drugs, medicaments and biological substances status: Secondary | ICD-10-CM | POA: Diagnosis not present

## 2015-11-10 DIAGNOSIS — Z79899 Other long term (current) drug therapy: Secondary | ICD-10-CM | POA: Diagnosis not present

## 2015-11-10 DIAGNOSIS — Z87891 Personal history of nicotine dependence: Secondary | ICD-10-CM | POA: Diagnosis not present

## 2015-11-10 DIAGNOSIS — J984 Other disorders of lung: Secondary | ICD-10-CM | POA: Diagnosis not present

## 2015-11-10 DIAGNOSIS — I824Z3 Acute embolism and thrombosis of unspecified deep veins of distal lower extremity, bilateral: Secondary | ICD-10-CM | POA: Diagnosis not present

## 2015-11-10 DIAGNOSIS — E213 Hyperparathyroidism, unspecified: Secondary | ICD-10-CM | POA: Diagnosis not present

## 2015-11-10 DIAGNOSIS — Z7982 Long term (current) use of aspirin: Secondary | ICD-10-CM | POA: Diagnosis not present

## 2015-11-19 DIAGNOSIS — K219 Gastro-esophageal reflux disease without esophagitis: Secondary | ICD-10-CM | POA: Diagnosis not present

## 2015-11-19 DIAGNOSIS — Z79899 Other long term (current) drug therapy: Secondary | ICD-10-CM | POA: Diagnosis not present

## 2015-11-19 DIAGNOSIS — E559 Vitamin D deficiency, unspecified: Secondary | ICD-10-CM | POA: Diagnosis not present

## 2015-11-19 DIAGNOSIS — R194 Change in bowel habit: Secondary | ICD-10-CM | POA: Diagnosis not present

## 2015-11-20 DIAGNOSIS — Z941 Heart transplant status: Secondary | ICD-10-CM | POA: Diagnosis not present

## 2015-11-29 DIAGNOSIS — R1310 Dysphagia, unspecified: Secondary | ICD-10-CM | POA: Diagnosis not present

## 2015-11-29 DIAGNOSIS — E785 Hyperlipidemia, unspecified: Secondary | ICD-10-CM | POA: Diagnosis not present

## 2015-11-29 DIAGNOSIS — G4733 Obstructive sleep apnea (adult) (pediatric): Secondary | ICD-10-CM | POA: Diagnosis not present

## 2015-11-29 DIAGNOSIS — E213 Hyperparathyroidism, unspecified: Secondary | ICD-10-CM | POA: Diagnosis not present

## 2015-11-29 DIAGNOSIS — F419 Anxiety disorder, unspecified: Secondary | ICD-10-CM | POA: Diagnosis not present

## 2015-11-29 DIAGNOSIS — I82592 Chronic embolism and thrombosis of other specified deep vein of left lower extremity: Secondary | ICD-10-CM | POA: Diagnosis not present

## 2015-11-29 DIAGNOSIS — I10 Essential (primary) hypertension: Secondary | ICD-10-CM | POA: Diagnosis not present

## 2015-11-29 DIAGNOSIS — K219 Gastro-esophageal reflux disease without esophagitis: Secondary | ICD-10-CM | POA: Diagnosis not present

## 2015-11-29 DIAGNOSIS — J45909 Unspecified asthma, uncomplicated: Secondary | ICD-10-CM | POA: Diagnosis not present

## 2015-11-29 DIAGNOSIS — I701 Atherosclerosis of renal artery: Secondary | ICD-10-CM | POA: Diagnosis not present

## 2015-11-29 DIAGNOSIS — E039 Hypothyroidism, unspecified: Secondary | ICD-10-CM | POA: Diagnosis not present

## 2015-11-29 DIAGNOSIS — I82532 Chronic embolism and thrombosis of left popliteal vein: Secondary | ICD-10-CM | POA: Diagnosis not present

## 2015-11-30 DIAGNOSIS — Z941 Heart transplant status: Secondary | ICD-10-CM | POA: Diagnosis not present

## 2015-12-14 DIAGNOSIS — E213 Hyperparathyroidism, unspecified: Secondary | ICD-10-CM | POA: Diagnosis not present

## 2015-12-14 DIAGNOSIS — D351 Benign neoplasm of parathyroid gland: Secondary | ICD-10-CM | POA: Diagnosis not present

## 2015-12-27 DIAGNOSIS — K121 Other forms of stomatitis: Secondary | ICD-10-CM | POA: Diagnosis not present

## 2015-12-27 DIAGNOSIS — D351 Benign neoplasm of parathyroid gland: Secondary | ICD-10-CM | POA: Diagnosis not present

## 2015-12-27 DIAGNOSIS — E213 Hyperparathyroidism, unspecified: Secondary | ICD-10-CM | POA: Diagnosis not present

## 2015-12-27 DIAGNOSIS — R5383 Other fatigue: Secondary | ICD-10-CM | POA: Diagnosis not present

## 2015-12-27 DIAGNOSIS — Z79899 Other long term (current) drug therapy: Secondary | ICD-10-CM | POA: Diagnosis not present

## 2015-12-27 DIAGNOSIS — J029 Acute pharyngitis, unspecified: Secondary | ICD-10-CM | POA: Diagnosis not present

## 2015-12-27 DIAGNOSIS — R531 Weakness: Secondary | ICD-10-CM | POA: Diagnosis not present

## 2015-12-27 DIAGNOSIS — Z941 Heart transplant status: Secondary | ICD-10-CM | POA: Diagnosis not present

## 2015-12-29 DIAGNOSIS — E213 Hyperparathyroidism, unspecified: Secondary | ICD-10-CM | POA: Diagnosis not present

## 2015-12-29 DIAGNOSIS — Z79899 Other long term (current) drug therapy: Secondary | ICD-10-CM | POA: Diagnosis not present

## 2015-12-29 DIAGNOSIS — Z6829 Body mass index (BMI) 29.0-29.9, adult: Secondary | ICD-10-CM | POA: Diagnosis not present

## 2015-12-29 DIAGNOSIS — Z941 Heart transplant status: Secondary | ICD-10-CM | POA: Diagnosis not present

## 2015-12-29 DIAGNOSIS — K120 Recurrent oral aphthae: Secondary | ICD-10-CM | POA: Diagnosis not present

## 2016-01-05 DIAGNOSIS — Z6829 Body mass index (BMI) 29.0-29.9, adult: Secondary | ICD-10-CM | POA: Diagnosis not present

## 2016-01-05 DIAGNOSIS — K120 Recurrent oral aphthae: Secondary | ICD-10-CM | POA: Diagnosis not present

## 2016-01-05 DIAGNOSIS — B370 Candidal stomatitis: Secondary | ICD-10-CM | POA: Diagnosis not present

## 2016-01-05 DIAGNOSIS — R5383 Other fatigue: Secondary | ICD-10-CM | POA: Diagnosis not present

## 2016-01-05 DIAGNOSIS — E038 Other specified hypothyroidism: Secondary | ICD-10-CM | POA: Diagnosis not present

## 2016-01-06 DIAGNOSIS — R5383 Other fatigue: Secondary | ICD-10-CM | POA: Diagnosis not present

## 2016-01-10 DIAGNOSIS — Z941 Heart transplant status: Secondary | ICD-10-CM | POA: Diagnosis not present

## 2016-01-10 DIAGNOSIS — R000 Tachycardia, unspecified: Secondary | ICD-10-CM | POA: Diagnosis not present

## 2016-01-31 DIAGNOSIS — Z941 Heart transplant status: Secondary | ICD-10-CM | POA: Diagnosis not present

## 2016-01-31 DIAGNOSIS — R000 Tachycardia, unspecified: Secondary | ICD-10-CM | POA: Diagnosis not present

## 2016-02-02 ENCOUNTER — Other Ambulatory Visit (HOSPITAL_COMMUNITY): Payer: Self-pay

## 2016-02-03 ENCOUNTER — Ambulatory Visit (HOSPITAL_COMMUNITY): Admission: RE | Admit: 2016-02-03 | Payer: Medicare Other | Source: Ambulatory Visit

## 2016-02-03 DIAGNOSIS — Z941 Heart transplant status: Secondary | ICD-10-CM | POA: Diagnosis not present

## 2016-02-03 DIAGNOSIS — Z79899 Other long term (current) drug therapy: Secondary | ICD-10-CM | POA: Diagnosis not present

## 2016-02-07 ENCOUNTER — Telehealth: Payer: Self-pay | Admitting: *Deleted

## 2016-02-07 NOTE — Telephone Encounter (Signed)
Left message, I was unable to reach pt.

## 2016-02-09 ENCOUNTER — Other Ambulatory Visit (HOSPITAL_COMMUNITY): Payer: Self-pay | Admitting: *Deleted

## 2016-02-10 ENCOUNTER — Ambulatory Visit (HOSPITAL_COMMUNITY): Payer: Medicare Other | Attending: Internal Medicine

## 2016-02-15 DIAGNOSIS — L570 Actinic keratosis: Secondary | ICD-10-CM | POA: Diagnosis not present

## 2016-02-15 DIAGNOSIS — D2239 Melanocytic nevi of other parts of face: Secondary | ICD-10-CM | POA: Diagnosis not present

## 2016-02-15 DIAGNOSIS — L821 Other seborrheic keratosis: Secondary | ICD-10-CM | POA: Diagnosis not present

## 2016-02-15 DIAGNOSIS — D1801 Hemangioma of skin and subcutaneous tissue: Secondary | ICD-10-CM | POA: Diagnosis not present

## 2016-02-15 DIAGNOSIS — L438 Other lichen planus: Secondary | ICD-10-CM | POA: Diagnosis not present

## 2016-03-05 DIAGNOSIS — Z941 Heart transplant status: Secondary | ICD-10-CM | POA: Diagnosis not present

## 2016-03-06 DIAGNOSIS — R001 Bradycardia, unspecified: Secondary | ICD-10-CM | POA: Diagnosis not present

## 2016-03-06 DIAGNOSIS — Z941 Heart transplant status: Secondary | ICD-10-CM | POA: Diagnosis not present

## 2016-03-06 DIAGNOSIS — Z01818 Encounter for other preprocedural examination: Secondary | ICD-10-CM | POA: Diagnosis not present

## 2016-03-06 DIAGNOSIS — R9431 Abnormal electrocardiogram [ECG] [EKG]: Secondary | ICD-10-CM | POA: Diagnosis not present

## 2016-03-06 DIAGNOSIS — I10 Essential (primary) hypertension: Secondary | ICD-10-CM | POA: Diagnosis not present

## 2016-03-10 DIAGNOSIS — Z941 Heart transplant status: Secondary | ICD-10-CM | POA: Diagnosis not present

## 2016-03-10 DIAGNOSIS — I10 Essential (primary) hypertension: Secondary | ICD-10-CM | POA: Diagnosis not present

## 2016-03-10 DIAGNOSIS — Z6830 Body mass index (BMI) 30.0-30.9, adult: Secondary | ICD-10-CM | POA: Diagnosis not present

## 2016-03-10 DIAGNOSIS — J069 Acute upper respiratory infection, unspecified: Secondary | ICD-10-CM | POA: Diagnosis not present

## 2016-04-10 DIAGNOSIS — Z941 Heart transplant status: Secondary | ICD-10-CM | POA: Diagnosis not present

## 2016-05-14 ENCOUNTER — Emergency Department (HOSPITAL_COMMUNITY)
Admission: EM | Admit: 2016-05-14 | Discharge: 2016-05-14 | Disposition: A | Discharge status: Home / Self Care | Payer: Medicare Other | Attending: Emergency Medicine | Admitting: Emergency Medicine

## 2016-05-14 ENCOUNTER — Encounter (HOSPITAL_COMMUNITY): Payer: Self-pay

## 2016-05-14 ENCOUNTER — Emergency Department (HOSPITAL_BASED_OUTPATIENT_CLINIC_OR_DEPARTMENT_OTHER)
Admit: 2016-05-14 | Discharge: 2016-05-14 | Disposition: A | Discharge status: Home / Self Care | Payer: Medicare Other | Attending: Emergency Medicine | Admitting: Emergency Medicine

## 2016-05-14 DIAGNOSIS — M7989 Other specified soft tissue disorders: Secondary | ICD-10-CM | POA: Diagnosis not present

## 2016-05-14 DIAGNOSIS — M79605 Pain in left leg: Secondary | ICD-10-CM | POA: Diagnosis not present

## 2016-05-14 DIAGNOSIS — I509 Heart failure, unspecified: Secondary | ICD-10-CM | POA: Insufficient documentation

## 2016-05-14 DIAGNOSIS — Z87891 Personal history of nicotine dependence: Secondary | ICD-10-CM | POA: Insufficient documentation

## 2016-05-14 DIAGNOSIS — Z5321 Procedure and treatment not carried out due to patient leaving prior to being seen by health care provider: Secondary | ICD-10-CM | POA: Insufficient documentation

## 2016-05-14 DIAGNOSIS — J45909 Unspecified asthma, uncomplicated: Secondary | ICD-10-CM | POA: Diagnosis not present

## 2016-05-14 DIAGNOSIS — Z86718 Personal history of other venous thrombosis and embolism: Secondary | ICD-10-CM | POA: Diagnosis not present

## 2016-05-14 DIAGNOSIS — Z950 Presence of cardiac pacemaker: Secondary | ICD-10-CM | POA: Insufficient documentation

## 2016-05-14 LAB — CBC
HCT: 40.7 % (ref 36.0–46.0)
Hemoglobin: 12.5 g/dL (ref 12.0–15.0)
MCH: 26.1 pg (ref 26.0–34.0)
MCHC: 30.7 g/dL (ref 30.0–36.0)
MCV: 85 fL (ref 78.0–100.0)
Platelets: 338 10*3/uL (ref 150–400)
RBC: 4.79 MIL/uL (ref 3.87–5.11)
RDW: 14.5 % (ref 11.5–15.5)
WBC: 9.4 10*3/uL (ref 4.0–10.5)

## 2016-05-14 LAB — BASIC METABOLIC PANEL
Anion gap: 6 (ref 5–15)
BUN: 19 mg/dL (ref 6–20)
CO2: 24 mmol/L (ref 22–32)
Calcium: 9.2 mg/dL (ref 8.9–10.3)
Chloride: 107 mmol/L (ref 101–111)
Creatinine, Ser: 1.04 mg/dL — ABNORMAL HIGH (ref 0.44–1.00)
GFR calc Af Amer: 59 mL/min — ABNORMAL LOW (ref >60)
GFR calc non Af Amer: 51 mL/min — ABNORMAL LOW (ref >60)
Glucose, Bld: 119 mg/dL — ABNORMAL HIGH (ref 65–99)
Potassium: 4.6 mmol/L (ref 3.5–5.1)
Sodium: 137 mmol/L (ref 135–145)

## 2016-05-14 NOTE — ED Notes (Signed)
VASCULAR AWARE OF NEEDED STUDY

## 2016-05-14 NOTE — Progress Notes (Signed)
VASCULAR LAB PRELIMINARY  PRELIMINARY  PRELIMINARY  PRELIMINARY  Left lower extremity venous duplex completed.    Preliminary report:  Left:  No evidence of DVT, superficial thrombosis, or Baker's cyst.  Silas Muff, RVT 05/14/2016, 3:50 PM

## 2016-05-16 DIAGNOSIS — E213 Hyperparathyroidism, unspecified: Secondary | ICD-10-CM | POA: Diagnosis not present

## 2016-05-16 DIAGNOSIS — M7062 Trochanteric bursitis, left hip: Secondary | ICD-10-CM | POA: Diagnosis not present

## 2016-05-16 DIAGNOSIS — K120 Recurrent oral aphthae: Secondary | ICD-10-CM | POA: Diagnosis not present

## 2016-05-16 DIAGNOSIS — M7989 Other specified soft tissue disorders: Secondary | ICD-10-CM | POA: Diagnosis not present

## 2016-05-16 DIAGNOSIS — Z941 Heart transplant status: Secondary | ICD-10-CM | POA: Diagnosis not present

## 2016-05-17 DIAGNOSIS — Z941 Heart transplant status: Secondary | ICD-10-CM | POA: Diagnosis not present

## 2016-06-02 DIAGNOSIS — Z23 Encounter for immunization: Secondary | ICD-10-CM | POA: Diagnosis not present

## 2016-06-20 DIAGNOSIS — Z941 Heart transplant status: Secondary | ICD-10-CM | POA: Diagnosis not present

## 2016-06-21 DIAGNOSIS — Z941 Heart transplant status: Secondary | ICD-10-CM | POA: Diagnosis not present

## 2016-06-28 DIAGNOSIS — E784 Other hyperlipidemia: Secondary | ICD-10-CM | POA: Diagnosis not present

## 2016-06-28 DIAGNOSIS — R7301 Impaired fasting glucose: Secondary | ICD-10-CM | POA: Diagnosis not present

## 2016-06-28 DIAGNOSIS — I10 Essential (primary) hypertension: Secondary | ICD-10-CM | POA: Diagnosis not present

## 2016-07-05 DIAGNOSIS — E669 Obesity, unspecified: Secondary | ICD-10-CM | POA: Diagnosis not present

## 2016-07-05 DIAGNOSIS — E784 Other hyperlipidemia: Secondary | ICD-10-CM | POA: Diagnosis not present

## 2016-07-05 DIAGNOSIS — R7301 Impaired fasting glucose: Secondary | ICD-10-CM | POA: Diagnosis not present

## 2016-07-05 DIAGNOSIS — Z0000 Encounter for general adult medical examination without abnormal findings: Secondary | ICD-10-CM | POA: Diagnosis not present

## 2016-07-05 DIAGNOSIS — M810 Age-related osteoporosis without current pathological fracture: Secondary | ICD-10-CM | POA: Diagnosis not present

## 2016-07-05 DIAGNOSIS — Z941 Heart transplant status: Secondary | ICD-10-CM | POA: Diagnosis not present

## 2016-07-05 DIAGNOSIS — Z86718 Personal history of other venous thrombosis and embolism: Secondary | ICD-10-CM | POA: Diagnosis not present

## 2016-07-05 DIAGNOSIS — F329 Major depressive disorder, single episode, unspecified: Secondary | ICD-10-CM | POA: Diagnosis not present

## 2016-07-05 DIAGNOSIS — I10 Essential (primary) hypertension: Secondary | ICD-10-CM | POA: Diagnosis not present

## 2016-07-05 DIAGNOSIS — E038 Other specified hypothyroidism: Secondary | ICD-10-CM | POA: Diagnosis not present

## 2016-07-06 DIAGNOSIS — Z9889 Other specified postprocedural states: Secondary | ICD-10-CM | POA: Diagnosis not present

## 2016-07-06 DIAGNOSIS — I509 Heart failure, unspecified: Secondary | ICD-10-CM | POA: Diagnosis not present

## 2016-07-06 DIAGNOSIS — I10 Essential (primary) hypertension: Secondary | ICD-10-CM | POA: Diagnosis not present

## 2016-07-06 DIAGNOSIS — Z87891 Personal history of nicotine dependence: Secondary | ICD-10-CM | POA: Diagnosis not present

## 2016-07-06 DIAGNOSIS — Z79899 Other long term (current) drug therapy: Secondary | ICD-10-CM | POA: Diagnosis not present

## 2016-07-06 DIAGNOSIS — Z941 Heart transplant status: Secondary | ICD-10-CM | POA: Diagnosis not present

## 2016-07-06 DIAGNOSIS — I110 Hypertensive heart disease with heart failure: Secondary | ICD-10-CM | POA: Diagnosis not present

## 2016-07-06 DIAGNOSIS — Z7982 Long term (current) use of aspirin: Secondary | ICD-10-CM | POA: Diagnosis not present

## 2016-07-06 DIAGNOSIS — Z4821 Encounter for aftercare following heart transplant: Secondary | ICD-10-CM | POA: Diagnosis not present

## 2016-07-06 DIAGNOSIS — Z86718 Personal history of other venous thrombosis and embolism: Secondary | ICD-10-CM | POA: Diagnosis not present

## 2016-07-18 ENCOUNTER — Inpatient Hospital Stay (HOSPITAL_COMMUNITY): Payer: Medicare Other

## 2016-07-18 ENCOUNTER — Inpatient Hospital Stay (HOSPITAL_COMMUNITY)
Admission: EM | Admit: 2016-07-18 | Discharge: 2016-07-22 | DRG: 372 | Disposition: A | Discharge status: Home / Self Care | Payer: Medicare Other | Attending: Internal Medicine | Admitting: Internal Medicine

## 2016-07-18 ENCOUNTER — Emergency Department (HOSPITAL_COMMUNITY): Payer: Medicare Other

## 2016-07-18 ENCOUNTER — Encounter (HOSPITAL_COMMUNITY): Payer: Self-pay | Admitting: Emergency Medicine

## 2016-07-18 DIAGNOSIS — R0902 Hypoxemia: Secondary | ICD-10-CM | POA: Diagnosis not present

## 2016-07-18 DIAGNOSIS — Z87891 Personal history of nicotine dependence: Secondary | ICD-10-CM

## 2016-07-18 DIAGNOSIS — F419 Anxiety disorder, unspecified: Secondary | ICD-10-CM | POA: Diagnosis present

## 2016-07-18 DIAGNOSIS — I10 Essential (primary) hypertension: Secondary | ICD-10-CM | POA: Diagnosis present

## 2016-07-18 DIAGNOSIS — M6281 Muscle weakness (generalized): Secondary | ICD-10-CM

## 2016-07-18 DIAGNOSIS — F32A Depression, unspecified: Secondary | ICD-10-CM | POA: Diagnosis present

## 2016-07-18 DIAGNOSIS — K529 Noninfective gastroenteritis and colitis, unspecified: Secondary | ICD-10-CM | POA: Diagnosis present

## 2016-07-18 DIAGNOSIS — Z66 Do not resuscitate: Secondary | ICD-10-CM | POA: Diagnosis present

## 2016-07-18 DIAGNOSIS — R10813 Right lower quadrant abdominal tenderness: Secondary | ICD-10-CM | POA: Diagnosis not present

## 2016-07-18 DIAGNOSIS — Z7982 Long term (current) use of aspirin: Secondary | ICD-10-CM | POA: Diagnosis not present

## 2016-07-18 DIAGNOSIS — A0472 Enterocolitis due to Clostridium difficile, not specified as recurrent: Principal | ICD-10-CM | POA: Diagnosis present

## 2016-07-18 DIAGNOSIS — Z79899 Other long term (current) drug therapy: Secondary | ICD-10-CM

## 2016-07-18 DIAGNOSIS — F329 Major depressive disorder, single episode, unspecified: Secondary | ICD-10-CM | POA: Diagnosis not present

## 2016-07-18 DIAGNOSIS — Z86718 Personal history of other venous thrombosis and embolism: Secondary | ICD-10-CM | POA: Diagnosis not present

## 2016-07-18 DIAGNOSIS — E039 Hypothyroidism, unspecified: Secondary | ICD-10-CM | POA: Diagnosis not present

## 2016-07-18 DIAGNOSIS — Z941 Heart transplant status: Secondary | ICD-10-CM

## 2016-07-18 DIAGNOSIS — R109 Unspecified abdominal pain: Secondary | ICD-10-CM | POA: Diagnosis not present

## 2016-07-18 DIAGNOSIS — K219 Gastro-esophageal reflux disease without esophagitis: Secondary | ICD-10-CM | POA: Diagnosis present

## 2016-07-18 DIAGNOSIS — K2970 Gastritis, unspecified, without bleeding: Secondary | ICD-10-CM | POA: Diagnosis not present

## 2016-07-18 DIAGNOSIS — R1032 Left lower quadrant pain: Secondary | ICD-10-CM | POA: Diagnosis not present

## 2016-07-18 HISTORY — DX: Heart transplant status: Z94.1

## 2016-07-18 HISTORY — DX: Acute embolism and thrombosis of unspecified deep veins of unspecified lower extremity: I82.409

## 2016-07-18 HISTORY — DX: Anxiety disorder, unspecified: F41.9

## 2016-07-18 HISTORY — DX: Essential (primary) hypertension: I10

## 2016-07-18 LAB — COMPREHENSIVE METABOLIC PANEL
ALT: 15 U/L (ref 14–54)
AST: 22 U/L (ref 15–41)
Albumin: 3.8 g/dL (ref 3.5–5.0)
Alkaline Phosphatase: 80 U/L (ref 38–126)
Anion gap: 10 (ref 5–15)
BUN: 15 mg/dL (ref 6–20)
CO2: 17 mmol/L — ABNORMAL LOW (ref 22–32)
Calcium: 9.4 mg/dL (ref 8.9–10.3)
Chloride: 110 mmol/L (ref 101–111)
Creatinine, Ser: 0.93 mg/dL (ref 0.44–1.00)
GFR calc Af Amer: >60 mL/min (ref >60)
GFR calc non Af Amer: 58 mL/min — ABNORMAL LOW (ref >60)
Glucose, Bld: 147 mg/dL — ABNORMAL HIGH (ref 65–99)
Potassium: 3.5 mmol/L (ref 3.5–5.1)
Sodium: 137 mmol/L (ref 135–145)
Total Bilirubin: 0.6 mg/dL (ref 0.3–1.2)
Total Protein: 6.9 g/dL (ref 6.5–8.1)

## 2016-07-18 LAB — I-STAT CHEM 8, ED
BUN: 16 mg/dL (ref 6–20)
Calcium, Ion: 1.14 mmol/L — ABNORMAL LOW (ref 1.15–1.40)
Chloride: 107 mmol/L (ref 101–111)
Creatinine, Ser: 0.8 mg/dL (ref 0.44–1.00)
Glucose, Bld: 142 mg/dL — ABNORMAL HIGH (ref 65–99)
HCT: 45 % (ref 36.0–46.0)
Hemoglobin: 15.3 g/dL — ABNORMAL HIGH (ref 12.0–15.0)
Potassium: 3.4 mmol/L — ABNORMAL LOW (ref 3.5–5.1)
Sodium: 140 mmol/L (ref 135–145)
TCO2: 19 mmol/L (ref 0–100)

## 2016-07-18 LAB — URINALYSIS, ROUTINE W REFLEX MICROSCOPIC
Bilirubin Urine: NEGATIVE
Glucose, UA: NEGATIVE mg/dL
Hgb urine dipstick: NEGATIVE
Ketones, ur: 15 mg/dL — AB
Leukocytes, UA: NEGATIVE
Nitrite: NEGATIVE
Protein, ur: NEGATIVE mg/dL
Specific Gravity, Urine: >1.046 — ABNORMAL HIGH (ref 1.005–1.030)
pH: 6 (ref 5.0–8.0)

## 2016-07-18 LAB — CBC WITH DIFFERENTIAL/PLATELET
Basophils Absolute: 0 10*3/uL (ref 0.0–0.1)
Basophils Relative: 0 %
Eosinophils Absolute: 0.1 10*3/uL (ref 0.0–0.7)
Eosinophils Relative: 1 %
HCT: 42.6 % (ref 36.0–46.0)
Hemoglobin: 14 g/dL (ref 12.0–15.0)
Lymphocytes Relative: 16 %
Lymphs Abs: 2.1 10*3/uL (ref 0.7–4.0)
MCH: 26.5 pg (ref 26.0–34.0)
MCHC: 32.9 g/dL (ref 30.0–36.0)
MCV: 80.7 fL (ref 78.0–100.0)
Monocytes Absolute: 0.9 10*3/uL (ref 0.1–1.0)
Monocytes Relative: 7 %
Neutro Abs: 10 10*3/uL — ABNORMAL HIGH (ref 1.7–7.7)
Neutrophils Relative %: 76 %
Platelets: 308 10*3/uL (ref 150–400)
RBC: 5.28 MIL/uL — ABNORMAL HIGH (ref 3.87–5.11)
RDW: 14.9 % (ref 11.5–15.5)
WBC: 13.2 10*3/uL — ABNORMAL HIGH (ref 4.0–10.5)

## 2016-07-18 LAB — C DIFFICILE QUICK SCREEN W PCR REFLEX
C Diff antigen: POSITIVE — AB
C Diff toxin: NEGATIVE

## 2016-07-18 LAB — I-STAT CG4 LACTIC ACID, ED: Lactic Acid, Venous: 1.26 mmol/L (ref 0.5–1.9)

## 2016-07-18 LAB — LIPASE, BLOOD: Lipase: 22 U/L (ref 11–51)

## 2016-07-18 MED ORDER — METRONIDAZOLE 500 MG PO TABS
500.0000 mg | ORAL_TABLET | Freq: Three times a day (TID) | ORAL | Status: DC
  Administered 2016-07-18: 500 mg via ORAL
  Filled 2016-07-18: qty 1

## 2016-07-18 MED ORDER — MORPHINE SULFATE (PF) 4 MG/ML IV SOLN
4.0000 mg | Freq: Once | INTRAVENOUS | Status: DC
  Filled 2016-07-18: qty 1

## 2016-07-18 MED ORDER — PANTOPRAZOLE SODIUM 40 MG PO TBEC
40.0000 mg | DELAYED_RELEASE_TABLET | Freq: Every day | ORAL | Status: DC
  Administered 2016-07-18 – 2016-07-19 (×2): 40 mg via ORAL
  Filled 2016-07-18 (×2): qty 1

## 2016-07-18 MED ORDER — SODIUM CHLORIDE 0.9 % IV BOLUS (SEPSIS)
1000.0000 mL | Freq: Once | INTRAVENOUS | Status: AC
  Administered 2016-07-18: 1000 mL via INTRAVENOUS

## 2016-07-18 MED ORDER — MORPHINE SULFATE (PF) 4 MG/ML IV SOLN
4.0000 mg | INTRAVENOUS | Status: DC | PRN
  Administered 2016-07-19 – 2016-07-20 (×2): 4 mg via INTRAVENOUS
  Filled 2016-07-18 (×2): qty 1

## 2016-07-18 MED ORDER — CIPROFLOXACIN IN D5W 400 MG/200ML IV SOLN
400.0000 mg | Freq: Two times a day (BID) | INTRAVENOUS | Status: DC
  Administered 2016-07-19: 400 mg via INTRAVENOUS
  Filled 2016-07-18: qty 200

## 2016-07-18 MED ORDER — LEVOTHYROXINE SODIUM 50 MCG PO TABS
50.0000 ug | ORAL_TABLET | Freq: Every day | ORAL | Status: DC
  Administered 2016-07-19 – 2016-07-22 (×4): 50 ug via ORAL
  Filled 2016-07-18 (×4): qty 1

## 2016-07-18 MED ORDER — ONDANSETRON HCL 4 MG/2ML IJ SOLN
4.0000 mg | Freq: Once | INTRAMUSCULAR | Status: AC
  Administered 2016-07-18: 4 mg via INTRAVENOUS
  Filled 2016-07-18: qty 2

## 2016-07-18 MED ORDER — ACETAMINOPHEN 650 MG RE SUPP: 650.0000 mg | Freq: Four times a day (QID) | RECTAL | Status: DC | PRN

## 2016-07-18 MED ORDER — HEPARIN SODIUM (PORCINE) 5000 UNIT/ML IJ SOLN
5000.0000 [IU] | Freq: Three times a day (TID) | INTRAMUSCULAR | Status: DC
  Administered 2016-07-18 – 2016-07-22 (×11): 5000 [IU] via SUBCUTANEOUS
  Filled 2016-07-18 (×11): qty 1

## 2016-07-18 MED ORDER — LOSARTAN POTASSIUM 50 MG PO TABS
50.0000 mg | ORAL_TABLET | Freq: Two times a day (BID) | ORAL | Status: DC
  Administered 2016-07-18 – 2016-07-22 (×8): 50 mg via ORAL
  Filled 2016-07-18 (×8): qty 1

## 2016-07-18 MED ORDER — HYDRALAZINE HCL 20 MG/ML IJ SOLN: 10.0000 mg | Freq: Three times a day (TID) | INTRAMUSCULAR | Status: DC | PRN

## 2016-07-18 MED ORDER — ACETAMINOPHEN 325 MG PO TABS
650.0000 mg | ORAL_TABLET | Freq: Four times a day (QID) | ORAL | Status: DC | PRN
  Filled 2016-07-18: qty 2

## 2016-07-18 MED ORDER — KCL IN DEXTROSE-NACL 20-5-0.45 MEQ/L-%-% IV SOLN
INTRAVENOUS | Status: DC
  Administered 2016-07-18 – 2016-07-20 (×3): via INTRAVENOUS
  Filled 2016-07-18 (×6): qty 1000

## 2016-07-18 MED ORDER — IOPAMIDOL (ISOVUE-300) INJECTION 61%
INTRAVENOUS | Status: AC
  Administered 2016-07-18: 100 mL
  Filled 2016-07-18: qty 100

## 2016-07-18 MED ORDER — LORAZEPAM 2 MG/ML IJ SOLN: 1.0000 mg | Freq: Four times a day (QID) | INTRAMUSCULAR | Status: DC | PRN

## 2016-07-18 MED ORDER — TACROLIMUS 0.5 MG PO CAPS
0.5000 mg | ORAL_CAPSULE | Freq: Two times a day (BID) | ORAL | Status: DC
  Administered 2016-07-18 – 2016-07-22 (×8): 0.5 mg via ORAL
  Filled 2016-07-18 (×9): qty 1

## 2016-07-18 MED ORDER — ALBUTEROL SULFATE (2.5 MG/3ML) 0.083% IN NEBU: 2.5000 mg | INHALATION_SOLUTION | RESPIRATORY_TRACT | Status: DC | PRN

## 2016-07-18 MED ORDER — MORPHINE SULFATE (PF) 4 MG/ML IV SOLN
4.0000 mg | Freq: Once | INTRAVENOUS | Status: AC
  Administered 2016-07-18: 4 mg via INTRAVENOUS

## 2016-07-18 MED ORDER — METRONIDAZOLE IN NACL 5-0.79 MG/ML-% IV SOLN
500.0000 mg | Freq: Three times a day (TID) | INTRAVENOUS | Status: DC
  Administered 2016-07-18 – 2016-07-19 (×2): 500 mg via INTRAVENOUS
  Filled 2016-07-18 (×2): qty 100

## 2016-07-18 MED ORDER — ONDANSETRON HCL 4 MG/2ML IJ SOLN
4.0000 mg | Freq: Once | INTRAMUSCULAR | Status: DC
  Filled 2016-07-18: qty 2

## 2016-07-18 MED ORDER — MORPHINE SULFATE (PF) 4 MG/ML IV SOLN
4.0000 mg | Freq: Once | INTRAVENOUS | Status: AC
  Administered 2016-07-18: 4 mg via INTRAVENOUS
  Filled 2016-07-18: qty 1

## 2016-07-18 MED ORDER — ASPIRIN EC 81 MG PO TBEC
81.0000 mg | DELAYED_RELEASE_TABLET | Freq: Every day | ORAL | Status: DC
  Administered 2016-07-18 – 2016-07-22 (×5): 81 mg via ORAL
  Filled 2016-07-18 (×5): qty 1

## 2016-07-18 MED ORDER — CIPROFLOXACIN IN D5W 400 MG/200ML IV SOLN
400.0000 mg | Freq: Once | INTRAVENOUS | Status: AC
  Administered 2016-07-18: 400 mg via INTRAVENOUS
  Filled 2016-07-18: qty 200

## 2016-07-18 MED ORDER — MYCOPHENOLATE MOFETIL 250 MG PO CAPS
250.0000 mg | ORAL_CAPSULE | Freq: Two times a day (BID) | ORAL | Status: DC
  Administered 2016-07-18 – 2016-07-22 (×8): 250 mg via ORAL
  Filled 2016-07-18 (×8): qty 1

## 2016-07-18 NOTE — ED Provider Notes (Addendum)
[facility]-EMERGENCY DEPT Provider Note   CSN: [id number] Arrival date & time: 07/18/16  1224     History   Chief Complaint Chief Complaint  Patient presents with  . Abdominal Pain    HPI [name] [name] is a 75 y.o. female.  75 yo F with a chief complaint of abdominal pain. This happened suddenly this morning. Sharp severe pain. Patient having some nausea and vomiting as well. Unable to tolerate by mouth this morning. Complaining of severe pain. Denies sick contacts.   The history is provided by the patient.  Abdominal Pain   This is a new problem. The current episode started 3 to 5 hours ago. The problem occurs constantly. The problem has not changed since onset.The pain is located in the LLQ and suprapubic region. The quality of the pain is sharp and shooting. The pain is at a severity of 8/10. The pain is moderate. Pertinent negatives include fever, nausea, vomiting, dysuria, headaches, arthralgias and myalgias. Nothing aggravates the symptoms. Nothing relieves the symptoms.    Past Medical History:  Diagnosis Date  . A-fib (HCC)   . CHF (congestive heart failure) (HCC)   . Depression   . GERD (gastroesophageal reflux disease)   . Hyperparathyroidism (HCC)   . Osteoporosis   . Pacemaker     Patient Active Problem List   Diagnosis Date Noted  . Colitis 07/18/2016    Past Surgical History:  Procedure Laterality Date  . ABDOMINAL HYSTERECTOMY    . ABLATION  2007  . ABLATION ON ENDOMETRIOSIS    . APPENDECTOMY    . CARDIAC CATHETERIZATION  2009  . HEART TRANSPLANT  2009  . HEART TRANSPLANT    . PACEMAKER INSERTION  2005  . TOTAL ABDOMINAL HYSTERECTOMY W/ BILATERAL SALPINGOOPHORECTOMY      OB History    No data available       Home Medications    Prior to Admission medications   Medication Sig Start Date End Date Taking? Authorizing Provider  aspirin 81 MG tablet Take 81 mg by mouth daily.    Historical Provider, MD  clonazePAM (KLONOPIN) 1 MG tablet Take 1 mg  by mouth daily.    Historical Provider, MD  conjugated estrogens (PREMARIN) vaginal cream Place 1 Applicatorful vaginally daily.    Historical Provider, MD  cyanocobalamin 500 MCG tablet Take 500 mcg by mouth daily.    Historical Provider, MD  escitalopram (LEXAPRO) 20 MG tablet Take 20 mg by mouth daily.    Historical Provider, MD  esomeprazole (NEXIUM) 20 MG capsule Take 20 mg by mouth daily at 12 noon.    Historical Provider, MD  iron polysaccharides (NIFEREX) 150 MG capsule Take 150 mg by mouth daily.    Historical Provider, MD  levothyroxine (SYNTHROID, LEVOTHROID) 50 MCG tablet Take 50 mcg by mouth daily before breakfast.    Historical Provider, MD  losartan (COZAAR) 50 MG tablet Take 50 mg by mouth 2 (two) times daily.    Historical Provider, MD  magnesium oxide (MAG-OX) 400 MG tablet Take 400 mg by mouth daily.    Historical Provider, MD  mycophenolate (CELLCEPT) 250 MG capsule Take by mouth 2 (two) times daily.    Historical Provider, MD  tacrolimus (PROGRAF) 0.5 MG capsule Take 0.5 mg by mouth 2 (two) times daily.    Historical Provider, MD  Vitamin D, Ergocalciferol, (DRISDOL) 50000 UNITS CAPS capsule Take 50,000 Units by mouth every 7 (seven) days.    Historical Provider, MD  Zoledronic Acid (RECLAST IV) Inject  into the vein.    Historical Provider, MD    Family History Family History  Problem Relation Age of Onset  . Heart attack Mother   . Colon cancer Father   . Heart attack Maternal Grandmother   . Heart attack Paternal Grandmother   . Diabetes type II Paternal Grandmother   . OCD Daughter     Social History Social History  Substance Use Topics  . Smoking status: Former Games developer  . Smokeless tobacco: Never Used     Comment: Quit 1960  . Alcohol use No     Allergies   Antihistamines, chlorpheniramine-type and Codeine   Review of Systems Review of Systems  Constitutional: Negative for chills and fever.  HENT: Negative for congestion and rhinorrhea.   Eyes:  Negative for redness and visual disturbance.  Respiratory: Negative for shortness of breath and wheezing.   Cardiovascular: Negative for chest pain and palpitations.  Gastrointestinal: Positive for abdominal pain. Negative for nausea and vomiting.  Genitourinary: Negative for dysuria and urgency.  Musculoskeletal: Negative for arthralgias and myalgias.  Skin: Negative for pallor and wound.  Neurological: Negative for dizziness and headaches.     Physical Exam Updated Vital Signs BP 139/55 (BP Location: Right Arm)   Pulse 68   Temp 97.7 F (36.5 C) (Oral) Comment: Simultaneous filing. User may not have seen previous data. Comment (Src): Simultaneous filing. User may not have seen previous data.  Resp 22   Ht 5\' 3"  (1.6 m)   Wt 170 lb (77.1 kg)   SpO2 98%   BMI 30.11 kg/m   Physical Exam  Constitutional: She is oriented to person, place, and time. She appears well-developed and well-nourished. No distress.  HENT:  Head: Normocephalic and atraumatic.  Eyes: EOM are normal. Pupils are equal, round, and reactive to light.  Neck: Normal range of motion. Neck supple.  Cardiovascular: Normal rate and regular rhythm.  Exam reveals no gallop and no friction rub.   No murmur heard. Pulmonary/Chest: Effort normal. She has no wheezes. She has no rales.  Abdominal: Soft. She exhibits no distension and no mass. There is tenderness (worst to suprapubic and LLQ). There is no guarding.  Musculoskeletal: She exhibits no edema or tenderness.  Neurological: She is alert and oriented to person, place, and time.  Skin: Skin is warm and dry. She is not diaphoretic.  Psychiatric: She has a normal mood and affect. Her behavior is normal.  Nursing note and vitals reviewed.    ED Treatments / Results  Labs (all labs ordered are listed, but only abnormal results are displayed) Labs Reviewed  COMPREHENSIVE METABOLIC PANEL - Abnormal; Notable for the following:       Result Value   CO2 17 (*)     Glucose, Bld 147 (*)    GFR calc non Af Amer 58 (*)    All other components within normal limits  CBC WITH DIFFERENTIAL/PLATELET - Abnormal; Notable for the following:    WBC 13.2 (*)    RBC 5.28 (*)    Neutro Abs 10.0 (*)    All other components within normal limits  I-STAT CHEM 8, ED - Abnormal; Notable for the following:    Potassium 3.4 (*)    Glucose, Bld 142 (*)    Calcium, Ion 1.14 (*)    Hemoglobin 15.3 (*)    All other components within normal limits  LIPASE, BLOOD  URINALYSIS, ROUTINE W REFLEX MICROSCOPIC (NOT AT [facility])  I-STAT CG4 LACTIC ACID, ED  EKG  EKG Interpretation None       Radiology Ct Abdomen Pelvis W Contrast  Result Date: 07/18/2016 CLINICAL DATA:  Left lower quadrant pain. History of heart transplant. History of hysterectomy and appendectomy. EXAM: CT ABDOMEN AND PELVIS WITH CONTRAST TECHNIQUE: Multidetector CT imaging of the abdomen and pelvis was performed using the standard protocol following bolus administration of intravenous contrast. CONTRAST:  100mL ISOVUE-300 IOPAMIDOL (ISOVUE-300) INJECTION 61% COMPARISON:  04/19/2016 2007 FINDINGS: Lower chest: Bibasilar atelectasis. No pleural effusions. Median sternotomy wires are present. Hepatobiliary: Normal appearance of the liver, gallbladder and portal venous system. Pancreas: Normal appearance of the pancreas without inflammation or duct dilatation. Spleen: Normal appearance of spleen without enlargement. Adrenals/Urinary Tract: Normal adrenal glands. Mild atrophy in the right kidney. No hydronephrosis. Mild distention of the urinary bladder without gross abnormality. Normal appearance of the left kidney without hydronephrosis. Stomach/Bowel: Small to moderate sized hiatal hernia. Abnormal location of the duodenum-jejunal junction. 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 This bowel configuration is similar to the previous examination. There is a  large amount of the small bowel in the right upper abdomen. Diverticula involving the sigmoid colon without inflammation in the sigmoid colon. However, there is mild wall thickening in the ascending colon, splenic flexure and transverse colon. Findings are suggestive for mild colitis. There is also distention of the cecum which is located in the right upper quadrant. No gross abnormality to the ileocecal valve area. Diverticula involving the descending colon. Mild pericolonic stranding involving the descending colon. 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 Atherosclerotic disease in the aorta without aneurysm. No significant lymph node enlargement in the abdomen or pelvis. Reproductive: Status post hysterectomy. No adnexal masses. Other: Minimal fluid or edema adjacent to the descending colon on sequence 201, image 57. No significant free fluid in the pelvis. Musculoskeletal: No acute bone abnormality. IMPRESSION: Mild colonic wall thickening and pericolonic stranding in the left abdomen. Findings are most compatible with colitis involving the descending colon and portion of the transverse colon. There are scattered diverticuli in the left colon but findings are not suggestive for acute diverticulitis. Moderate distention of the cecum which is located in the right upper abdomen. Congenital bowel malrotation. These findings are similar to the previous examination. Small to moderate sized hiatal hernia. Aortic atherosclerosis. There is focal plaque along the anterior aspect of the supraceliac aorta. Electronically Signed   By: Richarda OverlieAdam  Henn M.D.   On: 07/18/2016 14:32    Procedures Procedures (including critical care time)  Medications Ordered in ED Medications  morphine 4 MG/ML injection 4 mg (not administered)  ondansetron (ZOFRAN) injection 4 mg (not administered)  ciprofloxacin (CIPRO) IVPB 400 mg (not administered)  metroNIDAZOLE (FLAGYL) tablet 500 mg (not  administered)  morphine 4 MG/ML injection 4 mg (not administered)  ondansetron (ZOFRAN) injection 4 mg (not administered)  sodium chloride 0.9 % bolus 1,000 mL (not administered)  morphine 4 MG/ML injection 4 mg (4 mg Intravenous Given 07/18/16 1303)  ondansetron (ZOFRAN) injection 4 mg (4 mg Intravenous Given 07/18/16 1302)  iopamidol (ISOVUE-300) 61 % injection (100 mLs  Contrast Given 07/18/16 1403)     Initial Impression / Assessment and Plan / ED Course  I have reviewed the triage vital signs and the nursing notes.  Pertinent labs & imaging results that were available during my care of the patient were reviewed by me and considered in my medical decision making (see chart  for details).  Clinical Course     75 yo F With a chief complaint of left lower quadrant abdominal pain. Sharp and severe going on just since this morning. Having some nausea and vomiting with this. Subjective chills. On my exam patient with diffuse lower abdominal pain but worse in the left lower quadrant. Elevated leukocytosis. CT scan consistent with colitis. Will start on antibiotics. Was given 2 doses of pain medicine with continued persistent pain unable to tolerate by mouth. Admit.   The patients results and plan were reviewed and discussed.   Any x-rays performed were independently reviewed by myself.   Differential diagnosis were considered with the presenting HPI.  Medications  morphine 4 MG/ML injection 4 mg (not administered)  ondansetron (ZOFRAN) injection 4 mg (not administered)  ciprofloxacin (CIPRO) IVPB 400 mg (not administered)  metroNIDAZOLE (FLAGYL) tablet 500 mg (not administered)  morphine 4 MG/ML injection 4 mg (not administered)  ondansetron (ZOFRAN) injection 4 mg (not administered)  sodium chloride 0.9 % bolus 1,000 mL (not administered)  morphine 4 MG/ML injection 4 mg (4 mg Intravenous Given 07/18/16 1303)  ondansetron (ZOFRAN) injection 4 mg (4 mg Intravenous Given 07/18/16 1302)    iopamidol (ISOVUE-300) 61 % injection (100 mLs  Contrast Given 07/18/16 1403)    Vitals:   07/18/16 1232 07/18/16 1315 07/18/16 1345 07/18/16 1419  BP:  145/67 143/64 139/55  Pulse:  64 64 68  Resp:  22 21 22   Temp:      TempSrc:      SpO2:  91% 93% 98%  Weight: 170 lb (77.1 kg)     Height: 5\' 3"  (1.6 m)       Final diagnoses:  Colitis    Admission/ observation were discussed with the admitting physician, patient and/or family and they are comfortable with the plan.    Final Clinical Impressions(s) / ED Diagnoses   Final diagnoses:  Colitis    New Prescriptions New Prescriptions   No medications on file     Melene PlanDan Shenay Torti, DO 07/18/16 1445    Melene Planan Lourine Alberico, DO 07/18/16 1459

## 2016-07-18 NOTE — ED Notes (Signed)
Pt desats whenever Pt falls asleep (<90% SpO2). Put Pt on 2 L via nasal cannula. Rn approved.

## 2016-07-18 NOTE — Progress Notes (Signed)
Pt having diarrhea Pt states "she takes Mg and Iron at home and will occasionally have diarrhea" MD notified will continue to monitor

## 2016-07-18 NOTE — ED Notes (Signed)
PT rates pain 4/10. PT denies nausea at this time. PT has no further requests.

## 2016-07-18 NOTE — ED Triage Notes (Signed)
PT reports abdominal pain that started at 10am. PT attempted to have a BM after pain onset. PT was able to have a small BM. Pain in localized to LLQ. PT reports pain is sharp and severe.   PT had a heart transplant in 2009 and is immunosuppressed. PT denies chest pain.   PT has had appendectomy and hysterectomy. PT denies dysuria.

## 2016-07-18 NOTE — ED Notes (Signed)
ED Provider at bedside. 

## 2016-07-18 NOTE — H&P (Signed)
[facility] History and Physical  [name] [name] ZOX:[id number] DOB: [birth year] DOA: 07/18/2016  Referring physician: PCP: [name] [name], William, MD   Chief Complaint: "It hurt so bad."  HPI: [name] [name] is a 75 y.o. female pedicle medical history significant for hypothyroidism, heart transplant, hypertension, depression, anxiety, history of venous thrombus embolism who presented to the emergency room with a chief complaint of abdominal pain. Patient had acute onset of severe abdominal pain in the early morning. The pain did not improve. Patient contacted family who then had her brought to the emergency room by car. Patient denies any nausea vomiting diarrhea. Patient has had no fever or chills. Patient denies any sick contacts. Patient denies a trauma to the abdomen. Patient does not have a history of diverticulosis, diverticulitis or colitis. Patient does not have a history of irritable bowel disease.  Review of Systems:  As per HPI otherwise 10 point review of systems negative.    Past Medical History:  Diagnosis Date  . A-fib (HCC)    resolved  . Anxiety   . CHF (congestive heart failure) (HCC)    resolved  . Depression   . DVT (deep venous thrombosis) (HCC)   . GERD (gastroesophageal reflux disease)   . Heart transplanted [facility](HCC)    [name]  . HTN (hypertension)   . Hyperparathyroidism (HCC)    resolved  . Osteoporosis   . Pacemaker    resolved   Past Surgical History:  Procedure Laterality Date  . ABDOMINAL HYSTERECTOMY    . ABLATION  2007  . ABLATION ON ENDOMETRIOSIS    . APPENDECTOMY    . CARDIAC CATHETERIZATION  2009  . HEART TRANSPLANT  2009  . HEART TRANSPLANT    . NECK SURGERY     removal of parathyroid  . PACEMAKER INSERTION  2005  . TOTAL ABDOMINAL HYSTERECTOMY W/ BILATERAL SALPINGOOPHORECTOMY     Social History:  reports that she has quit smoking. She has never used smokeless tobacco. She reports that she does not drink  alcohol or use drugs.  Allergies  Allergen Reactions  . Antihistamines, Chlorpheniramine-Type Other (See Comments)    Numbness  . Codeine Nausea Only    Family History  Problem Relation Age of Onset  . Heart attack Mother   . Colon cancer Father   . Heart attack Maternal Grandmother   . Heart attack Paternal Grandmother   . Diabetes type II Paternal Grandmother   . OCD Daughter      Prior to Admission medications   Medication Sig Start Date End Date Taking? Authorizing Provider  aspirin 81 MG tablet Take 81 mg by mouth daily.    Historical Provider, MD  clonazePAM (KLONOPIN) 1 MG tablet Take 1 mg by mouth daily.    Historical Provider, MD  conjugated estrogens (PREMARIN) vaginal cream Place 1 Applicatorful vaginally daily.    Historical Provider, MD  cyanocobalamin 500 MCG tablet Take 500 mcg by mouth daily.    Historical Provider, MD  escitalopram (LEXAPRO) 20 MG tablet Take 20 mg by mouth daily.    Historical Provider, MD  esomeprazole (NEXIUM) 20 MG capsule Take 20 mg by mouth daily at 12 noon.    Historical Provider, MD  iron polysaccharides (NIFEREX) 150 MG capsule Take 150 mg by mouth daily.    Historical Provider, MD  levothyroxine (SYNTHROID, LEVOTHROID) 50 MCG tablet Take 50 mcg by mouth daily before breakfast.    Historical Provider, MD  losartan (COZAAR) 50 MG tablet  Take 50 mg by mouth 2 (two) times daily.    Historical Provider, MD  magnesium oxide (MAG-OX) 400 MG tablet Take 400 mg by mouth daily.    Historical Provider, MD  mycophenolate (CELLCEPT) 250 MG capsule Take by mouth 2 (two) times daily.    Historical Provider, MD  tacrolimus (PROGRAF) 0.5 MG capsule Take 0.5 mg by mouth 2 (two) times daily.    Historical Provider, MD  Vitamin D, Ergocalciferol, (DRISDOL) 50000 UNITS CAPS capsule Take 50,000 Units by mouth every 7 (seven) days.    Historical Provider, MD  Zoledronic Acid (RECLAST IV) Inject into the vein.    Historical Provider, MD   Physical  Exam: Vitals:   07/18/16 1345 07/18/16 1419 07/18/16 1515 07/18/16 1624  BP: 143/64 139/55 (!) 132/47 (!) 147/55  Pulse: 64 68 66 74  Resp: 21 22 21 20   Temp:    98.2 F (36.8 C)  TempSrc:    Oral  SpO2: 93% 98% 94% 97%  Weight:      Height:        Wt Readings from Last 3 Encounters:  07/18/16 77.1 kg (170 lb)  12/30/14 78 kg (172 lb)  12/21/14 78 kg (172 lb)    General:  Appears calm and comfortable Eyes:  PERRL, EOMI, normal lids, iris ENT:  grossly normal hearing, lips & tongue Neck:  no LAD, masses or thyromegaly Cardiovascular:  RRR, no m/r/g. No LE edema.  Respiratory:  CTA bilaterally, no w/r/r. Normal respiratory effort. Abdomen:  soft, Nondistended, left lower quadrant pain, no rebound, left lower quadrant is firm Skin:  no rash or induration seen on limited exam Musculoskeletal:  grossly normal tone BUE/BLE Psychiatric:  grossly normal mood and affect, speech fluent and appropriate Neurologic:  CN 2-12 grossly intact, moves all extremities in coordinated fashion.          Labs on Admission:  Basic Metabolic Panel:  Recent Labs Lab 07/18/16 1252 07/18/16 1317  NA 137 140  K 3.5 3.4*  CL 110 107  CO2 17*  --   GLUCOSE 147* 142*  BUN 15 16  CREATININE 0.93 0.80  CALCIUM 9.4  --    Liver Function Tests:  Recent Labs Lab 07/18/16 1252  AST 22  ALT 15  ALKPHOS 80  BILITOT 0.6  PROT 6.9  ALBUMIN 3.8    Recent Labs Lab 07/18/16 1252  LIPASE 22   No results for input(s): AMMONIA in the last 168 hours. CBC:  Recent Labs Lab 07/18/16 1252 07/18/16 1317  WBC 13.2*  --   NEUTROABS 10.0*  --   HGB 14.0 15.3*  HCT 42.6 45.0  MCV 80.7  --   PLT 308  --    Cardiac Enzymes: No results for input(s): CKTOTAL, CKMB, CKMBINDEX, TROPONINI in the last 168 hours.  BNP (last 3 results) No results for input(s): BNP in the last 8760 hours.  ProBNP (last 3 results) No results for input(s): PROBNP in the last 8760 hours.   Serum creatinine: 0.8  mg/dL 40/98/11 9147 Estimated creatinine clearance: 57.9 mL/min  CBG: No results for input(s): GLUCAP in the last 168 hours.  Radiological Exams on Admission: Ct Abdomen Pelvis W Contrast  Result Date: 07/18/2016 CLINICAL DATA:  Left lower quadrant pain. History of heart transplant. History of hysterectomy and appendectomy. EXAM: CT ABDOMEN AND PELVIS WITH CONTRAST TECHNIQUE: Multidetector CT imaging of the abdomen and pelvis was performed using the standard protocol following bolus administration of intravenous contrast. CONTRAST:  ISOVUE-300  IOPAMIDOL (ISOVUE-300) INJECTION 61% COMPARISON:  04/19/2016 2007 FINDINGS: Lower chest: Bibasilar atelectasis. No pleural effusions. Median sternotomy wires are present. Hepatobiliary: Normal appearance of the liver, gallbladder and portal venous system. Pancreas: Normal appearance of the pancreas without inflammation or duct dilatation. Spleen: Normal appearance of spleen without enlargement. Adrenals/Urinary Tract: Normal adrenal glands. Mild atrophy in the right kidney. No hydronephrosis. Mild distention of the urinary bladder without gross abnormality. Normal appearance of the left kidney without hydronephrosis. Stomach/Bowel: Small to moderate sized hiatal hernia. Abnormal location of the duodenum-jejunal junction. 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 This bowel configuration is similar to the previous examination. There is a large amount of the small bowel in the right upper abdomen. Diverticula involving the sigmoid colon without inflammation in the sigmoid colon. However, there is mild wall thickening in the ascending colon, splenic flexure and transverse colon. Findings are suggestive for mild colitis. There is also distention of the cecum which is located in the right upper quadrant. No gross abnormality to the ileocecal valve area. Diverticula involving the descending colon. Mild  pericolonic stranding involving the descending colon. 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 Atherosclerotic disease in the aorta without aneurysm. No significant lymph node enlargement in the abdomen or pelvis. Reproductive: Status post hysterectomy. No adnexal masses. Other: Minimal fluid or edema adjacent to the descending colon on sequence 201, image 57. No significant free fluid in the pelvis. Musculoskeletal: No acute bone abnormality. IMPRESSION: Mild colonic wall thickening and pericolonic stranding in the left abdomen. Findings are most compatible with colitis involving the descending colon and portion of the transverse colon. There are scattered diverticuli in the left colon but findings are not suggestive for acute diverticulitis. Moderate distention of the cecum which is located in the right upper abdomen. Congenital bowel malrotation. These findings are similar to the previous examination. Small to moderate sized hiatal hernia. Aortic atherosclerosis. There is focal plaque along the anterior aspect of the supraceliac aorta. Electronically Signed   By: Richarda OverlieAdam  [name] M.D.   On: 07/18/2016 14:32    EKG: pending  Assessment/Plan Principal Problem:   Colitis Active Problems:   Depression   Heart transplanted (HCC)   HTN (hypertension)   Colitis Started on Cipro and Flagyl Enteric precautions Checking C. difficile  Hypoxia Onsets in the emergency room patient placed on 2 L doing well no acute work of breathing lungs clear on examination Checking x-ray Continuous pulse ox When necessary albuterol Note in the past patient has had difficulty with fluids resulting in pulmonary edema prior to her heart transplant.  Heart transplant Continue immunosuppressive drugs Continue aspirin  Hypertension When necessary hydralazine 10 mg IV as needed for severe blood pressure Continue outpatient Cozaar  Depression with anxiety No SI or  HI Holding Lexapro and Klonopin  GERD Cont OP Protonix 40 mg qd  Low iron Hold outpatient iron  Hypothyroid Continue Synthroid  Code Status: DNR DVT Prophylaxis: Heparin Family Communication: Dgtr at bedside Disposition Plan: Pending Improvement    Haydee SalterPhillip M Leonidus Rowand, MD Family Medicine [facility] [email or web address] Password [email or web address]

## 2016-07-18 NOTE — ED Notes (Signed)
Denny PeonErin, RN on 5W accepts report at this time.

## 2016-07-19 LAB — TROPONIN I
Troponin I: <0.03 ng/mL (ref <0.03)
Troponin I: <0.03 ng/mL (ref <0.03)
Troponin I: <0.03 ng/mL (ref <0.03)

## 2016-07-19 LAB — CLOSTRIDIUM DIFFICILE BY PCR: Toxigenic C. Difficile by PCR: POSITIVE — AB

## 2016-07-19 LAB — BASIC METABOLIC PANEL
Anion gap: 7 (ref 5–15)
BUN: 8 mg/dL (ref 6–20)
CO2: 20 mmol/L — ABNORMAL LOW (ref 22–32)
Calcium: 7.9 mg/dL — ABNORMAL LOW (ref 8.9–10.3)
Chloride: 112 mmol/L — ABNORMAL HIGH (ref 101–111)
Creatinine, Ser: 1 mg/dL (ref 0.44–1.00)
GFR calc Af Amer: >60 mL/min (ref >60)
GFR calc non Af Amer: 53 mL/min — ABNORMAL LOW (ref >60)
Glucose, Bld: 161 mg/dL — ABNORMAL HIGH (ref 65–99)
Potassium: 3.5 mmol/L (ref 3.5–5.1)
Sodium: 139 mmol/L (ref 135–145)

## 2016-07-19 LAB — CBC
HCT: 39.2 % (ref 36.0–46.0)
Hemoglobin: 12.6 g/dL (ref 12.0–15.0)
MCH: 26.4 pg (ref 26.0–34.0)
MCHC: 32.1 g/dL (ref 30.0–36.0)
MCV: 82.2 fL (ref 78.0–100.0)
Platelets: 315 10*3/uL (ref 150–400)
RBC: 4.77 MIL/uL (ref 3.87–5.11)
RDW: 15.1 % (ref 11.5–15.5)
WBC: 16.8 10*3/uL — ABNORMAL HIGH (ref 4.0–10.5)

## 2016-07-19 MED ORDER — PANTOPRAZOLE SODIUM 40 MG PO TBEC
40.0000 mg | DELAYED_RELEASE_TABLET | Freq: Every day | ORAL | Status: DC
  Administered 2016-07-20 – 2016-07-22 (×3): 40 mg via ORAL
  Filled 2016-07-19 (×3): qty 1

## 2016-07-19 MED ORDER — ESCITALOPRAM OXALATE 20 MG PO TABS
20.0000 mg | ORAL_TABLET | Freq: Every day | ORAL | Status: DC
  Administered 2016-07-19 – 2016-07-21 (×3): 20 mg via ORAL
  Filled 2016-07-19 (×3): qty 1

## 2016-07-19 MED ORDER — VANCOMYCIN 50 MG/ML ORAL SOLUTION
125.0000 mg | Freq: Four times a day (QID) | ORAL | Status: DC
  Administered 2016-07-19 – 2016-07-21 (×9): 125 mg via ORAL
  Filled 2016-07-19 (×10): qty 2.5

## 2016-07-19 MED ORDER — VANCOMYCIN 50 MG/ML ORAL SOLUTION
125.0000 mg | Freq: Four times a day (QID) | ORAL | Status: DC
  Filled 2016-07-19: qty 2.5

## 2016-07-19 MED ORDER — VITAMIN D (ERGOCALCIFEROL) 1.25 MG (50000 UNIT) PO CAPS
50000.0000 [IU] | ORAL_CAPSULE | ORAL | Status: DC
  Administered 2016-07-21: 50000 [IU] via ORAL
  Filled 2016-07-19: qty 1

## 2016-07-19 MED ORDER — CLONAZEPAM 1 MG PO TABS
1.0000 mg | ORAL_TABLET | Freq: Every evening | ORAL | Status: DC | PRN
  Administered 2016-07-22: 1 mg via ORAL
  Filled 2016-07-19 (×2): qty 1

## 2016-07-19 MED ORDER — ONDANSETRON HCL 4 MG/2ML IJ SOLN
4.0000 mg | Freq: Four times a day (QID) | INTRAMUSCULAR | Status: DC | PRN
  Administered 2016-07-19 – 2016-07-21 (×3): 4 mg via INTRAVENOUS
  Filled 2016-07-19 (×3): qty 2

## 2016-07-19 MED ORDER — DILTIAZEM HCL ER COATED BEADS 120 MG PO CP24
120.0000 mg | ORAL_CAPSULE | Freq: Two times a day (BID) | ORAL | Status: DC
  Administered 2016-07-19 – 2016-07-22 (×6): 120 mg via ORAL
  Filled 2016-07-19 (×6): qty 1

## 2016-07-19 NOTE — Progress Notes (Signed)
CRITICAL VALUE ALERT  Critical value received:  Troponin Level 031  Date of notification: 07/19/2016  Time of notification:  0320  Critical value read back:Yes.    Nurse who received alert:  Lurlean NannyQuinetta Aiven Kampe   MD notified (1st page): NP  K Schorr    Time of first page:  0321   MD notified (2nd page): NP K.Schorr   Time of second page: [phone]  Responding MD:  NP K. Schorr   Time MD responded:  [phone]

## 2016-07-19 NOTE — Progress Notes (Signed)
PROGRESS NOTE  [name] [name]:[id number] DOB: Dec 26, 1940 DOA: 07/18/2016 PCP: Martha Clan, MD   LOS: 1 day   Brief Narrative: [name] [name] is a 75 y.o. female with medical history significant for hypothyroidism, heart transplant, hypertension, depression, anxiety, history of venous thrombus embolism who presented to the emergency room with a chief complaint of abdominal pain. Patient had acute onset of severe abdominal pain in the early morning. The pain did not improve. Patient contacted family who then had her brought to the emergency room by car. Patient denies any nausea vomiting diarrhea. Patient has had no fever or chills. Patient denies any sick contacts. Patient denies a trauma to the abdomen. Patient does not have a history of diverticulosis, diverticulitis or colitis. Patient does not have a history of irritable bowel disease.  Assessment & Plan: Principal Problem:   Colitis Active Problems:   Depression   Heart transplanted (HCC)   HTN (hypertension)   Colitis, C diff positive - patient with clinical and radiologic findings of colitis, C diff Ag positive, toxin negative. Given watery diarrhea, abdominal pain and CT findings, will start po Vancomycin  - Enteric precautions  Heart transplant - Continue immunosuppressive drugs - Continue aspirin  Hypertension - When necessary hydralazine 10 mg IV as needed for severe blood pressure - Continue outpatient Cozaar  Depression with anxiety - No SI or HI  GERD - stop protonix given C diff  Low iron - Hold outpatient iron  Hypothyroid - Continue Synthroid    DVT prophylaxis: heparin Code Status: Full Family Communication: no family bedside, d/w daughter over the phone Disposition Plan: home when ready   Consultants:   None   Procedures:   None   Antimicrobials:  Ciprofloxacin 11/21 >> 11/22  Flagyl 11/21 >> 11/22  Po Vancomycin 11/22 >>   Subjective: - no chest pain, shortness of  breath - abdominal pain improving  Objective: Vitals:   07/18/16 1624 07/18/16 2124 07/19/16 0456 07/19/16 0859  BP: (!) 147/55 (!) 147/55 (!) 156/58 (!) 142/56  Pulse: 74 86 81 82  Resp: 20 18 18    Temp: 98.2 F (36.8 C) 99.2 F (37.3 C) 98.9 F (37.2 C)   TempSrc: Oral Oral Oral   SpO2: 97% 96% 94%   Weight:   76.9 kg (169 lb 8 oz)   Height:        Intake/Output Summary (Last 24 hours) at 07/19/16 1304 Last data filed at 07/19/16 0645  Gross per 24 hour  Intake          1852.08 ml  Output               19 ml  Net          1833.08 ml   Filed Weights   07/18/16 1232 07/19/16 0456  Weight: 77.1 kg (170 lb) 76.9 kg (169 lb 8 oz)    Examination: Constitutional: NAD Vitals:   07/18/16 1624 07/18/16 2124 07/19/16 0456 07/19/16 0859  BP: (!) 147/55 (!) 147/55 (!) 156/58 (!) 142/56  Pulse: 74 86 81 82  Resp: 20 18 18    Temp: 98.2 F (36.8 C) 99.2 F (37.3 C) 98.9 F (37.2 C)   TempSrc: Oral Oral Oral   SpO2: 97% 96% 94%   Weight:   76.9 kg (169 lb 8 oz)   Height:       Eyes: PERRL Respiratory: clear to auscultation bilaterally, no wheezing, no crackles. Cardiovascular: Regular rate and rhythm, no murmurs / rubs / gallops. No  LE edema.   Abdomen: no tenderness. Bowel sounds positive.  Musculoskeletal: no clubbing / cyanosis Skin: no rashes, lesions, ulcers. No induration Neurologic: non focal    Data Reviewed: I have personally reviewed following labs and imaging studies  CBC:  Recent Labs Lab 07/18/16 1252 07/18/16 1317 07/19/16 0921  WBC 13.2*  --  16.8*  NEUTROABS 10.0*  --   --   HGB 14.0 15.3* 12.6  HCT 42.6 45.0 39.2  MCV 80.7  --  82.2  PLT 308  --  315   Basic Metabolic Panel:  Recent Labs Lab 07/18/16 1252 07/18/16 1317 07/19/16 0921  NA 137 140 139  K 3.5 3.4* 3.5  CL 110 107 112*  CO2 17*  --  20*  GLUCOSE 147* 142* 161*  BUN 15 16 8   CREATININE 0.93 0.80 1.00  CALCIUM 9.4  --  7.9*   GFR: Estimated Creatinine Clearance: 46.3  mL/min (by C-G formula based on SCr of 1 mg/dL). Liver Function Tests:  Recent Labs Lab 07/18/16 1252  AST 22  ALT 15  ALKPHOS 80  BILITOT 0.6  PROT 6.9  ALBUMIN 3.8    Recent Labs Lab 07/18/16 1252  LIPASE 22   No results for input(s): AMMONIA in the last 168 hours. Coagulation Profile: No results for input(s): INR, PROTIME in the last 168 hours. Cardiac Enzymes:  Recent Labs Lab 07/19/16 0118 07/19/16 0921  TROPONINI <0.03 <0.03   BNP (last 3 results) No results for input(s): PROBNP in the last 8760 hours. HbA1C: No results for input(s): HGBA1C in the last 72 hours. CBG: No results for input(s): GLUCAP in the last 168 hours. Lipid Profile: No results for input(s): CHOL, HDL, LDLCALC, TRIG, CHOLHDL, LDLDIRECT in the last 72 hours. Thyroid Function Tests: No results for input(s): TSH, T4TOTAL, FREET4, T3FREE, THYROIDAB in the last 72 hours. Anemia Panel: No results for input(s): VITAMINB12, FOLATE, FERRITIN, TIBC, IRON, RETICCTPCT in the last 72 hours. Urine analysis:    Component Value Date/Time   COLORURINE YELLOW 07/18/2016 1530   APPEARANCEUR CLEAR 07/18/2016 1530   LABSPEC >1.046 (H) 07/18/2016 1530   PHURINE 6.0 07/18/2016 1530   GLUCOSEU NEGATIVE 07/18/2016 1530   HGBUR NEGATIVE 07/18/2016 1530   BILIRUBINUR NEGATIVE 07/18/2016 1530   KETONESUR 15 (A) 07/18/2016 1530   PROTEINUR NEGATIVE 07/18/2016 1530   UROBILINOGEN 0.2 06/07/2007 1601   NITRITE NEGATIVE 07/18/2016 1530   LEUKOCYTESUR NEGATIVE 07/18/2016 1530   Sepsis Labs: Invalid input(s): PROCALCITONIN, LACTICIDVEN  Recent Results (from the past 240 hour(s))  C difficile quick scan w PCR reflex     Status: Abnormal   Collection Time: 07/18/16 10:16 PM  Result Value Ref Range Status   C Diff antigen POSITIVE (A) NEGATIVE Final   C Diff toxin NEGATIVE NEGATIVE Final   C Diff interpretation Results are indeterminate. See PCR results.  Final  Clostridium Difficile by PCR     Status: Abnormal    Collection Time: 07/18/16 10:16 PM  Result Value Ref Range Status   Toxigenic C Difficile by pcr POSITIVE (A) NEGATIVE Final    Comment: Positive for toxigenic C. difficile with little to no toxin production. Only treat if clinical presentation suggests symptomatic illness.      Radiology Studies: Ct Abdomen Pelvis W Contrast  Result Date: 07/18/2016 CLINICAL DATA:  Left lower quadrant pain. History of heart transplant. History of hysterectomy and appendectomy. EXAM: CT ABDOMEN AND PELVIS WITH CONTRAST TECHNIQUE: Multidetector CT imaging of the abdomen and pelvis was performed using  the standard protocol following bolus administration of intravenous contrast. CONTRAST:  ISOVUE-300 IOPAMIDOL (ISOVUE-300) INJECTION 61% COMPARISON:  04/19/2016 2007 FINDINGS: Lower chest: Bibasilar atelectasis. No pleural effusions. Median sternotomy wires are present. Hepatobiliary: Normal appearance of the liver, gallbladder and portal venous system. Pancreas: Normal appearance of the pancreas without inflammation or duct dilatation. Spleen: Normal appearance of spleen without enlargement. Adrenals/Urinary Tract: Normal adrenal glands. Mild atrophy in the right kidney. No hydronephrosis. Mild distention of the urinary bladder without gross abnormality. Normal appearance of the left kidney without hydronephrosis. Stomach/Bowel: Small to moderate sized hiatal hernia. Abnormal location of the duodenum-jejunal junction. 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 This bowel configuration is similar to the previous examination. There is a large amount of the small bowel in the right upper abdomen. Diverticula involving the sigmoid colon without inflammation in the sigmoid colon. However, there is mild wall thickening in the ascending colon, splenic flexure and transverse colon. Findings are suggestive for mild colitis. There is also distention of the cecum  which is located in the right upper quadrant. No gross abnormality to the ileocecal valve area. Diverticula involving the descending colon. Mild pericolonic stranding involving the descending colon. 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 Atherosclerotic disease in the aorta without aneurysm. No significant lymph node enlargement in the abdomen or pelvis. Reproductive: Status post hysterectomy. No adnexal masses. Other: Minimal fluid or edema adjacent to the descending colon on sequence 201, image 57. No significant free fluid in the pelvis. Musculoskeletal: No acute bone abnormality. IMPRESSION: Mild colonic wall thickening and pericolonic stranding in the left abdomen. Findings are most compatible with colitis involving the descending colon and portion of the transverse colon. There are scattered diverticuli in the left colon but findings are not suggestive for acute diverticulitis. Moderate distention of the cecum which is located in the right upper abdomen. Congenital bowel malrotation. These findings are similar to the previous examination. Small to moderate sized hiatal hernia. Aortic atherosclerosis. There is focal plaque along the anterior aspect of the supraceliac aorta. Electronically Signed   By: Richarda Overlie M.D.   On: 07/18/2016 14:32   Dg Chest Port 1 View  Result Date: 07/19/2016 CLINICAL DATA:  Acute onset of hypoxia.  Initial encounter. EXAM: PORTABLE CHEST 1 VIEW COMPARISON:  Chest radiograph from 06/09/2007 FINDINGS: There is elevation of the right hemidiaphragm. There is no evidence of focal opacification, pleural effusion or pneumothorax. The cardiomediastinal silhouette is mildly enlarged. The patient is status post median sternotomy. No acute osseous abnormalities are seen. IMPRESSION: Elevation of the right hemidiaphragm. Lungs otherwise grossly clear. Mild cardiomegaly. Electronically Signed   By: Roanna Raider M.D.   On: 07/19/2016 02:02      Scheduled Meds: . aspirin EC  81 mg Oral Daily  . heparin  5,000 Units Subcutaneous Q8H  . levothyroxine  50 mcg Oral QAC breakfast  . losartan  50 mg Oral BID  .  morphine injection  4 mg Intravenous Once  . mycophenolate  250 mg Oral BID  . ondansetron (ZOFRAN) IV  4 mg Intravenous Once  . pantoprazole  40 mg Oral Daily  . tacrolimus  0.5 mg Oral BID  . vancomycin  125 mg Oral QID   Continuous Infusions: . dextrose 5 % and 0.45 % NaCl with KCl 20 mEq/L 125 mL/hr at 07/18/16 1820      Pamella Pert, MD, PhD [facility] Pager [phone]  If 7PM-7AM, please contact night-coverage [email or web address] Password [email or web address] 07/19/2016, 1:04 PM

## 2016-07-19 NOTE — Progress Notes (Signed)
Notified the on call NP K. Schorr of the test results of the C. Diff.  Will continue to monitor and notify as needed

## 2016-07-19 NOTE — Progress Notes (Signed)
Lab called with a corrected lab result of  troponin 0.31 is in fact negative lab corrected result in computer

## 2016-07-19 NOTE — Progress Notes (Signed)
Notified the on call NP K. Schorr of the [employer] [facility] results. No c/o of chest pain.   Will continue to monitor and notify as needed

## 2016-07-20 LAB — CBC
HCT: 38.2 % (ref 36.0–46.0)
Hemoglobin: 11.9 g/dL — ABNORMAL LOW (ref 12.0–15.0)
MCH: 25.7 pg — ABNORMAL LOW (ref 26.0–34.0)
MCHC: 31.2 g/dL (ref 30.0–36.0)
MCV: 82.5 fL (ref 78.0–100.0)
Platelets: 272 10*3/uL (ref 150–400)
RBC: 4.63 MIL/uL (ref 3.87–5.11)
RDW: 15.3 % (ref 11.5–15.5)
WBC: 14 10*3/uL — ABNORMAL HIGH (ref 4.0–10.5)

## 2016-07-20 LAB — BASIC METABOLIC PANEL
Anion gap: 6 (ref 5–15)
BUN: 5 mg/dL — ABNORMAL LOW (ref 6–20)
CO2: 22 mmol/L (ref 22–32)
Calcium: 8.1 mg/dL — ABNORMAL LOW (ref 8.9–10.3)
Chloride: 112 mmol/L — ABNORMAL HIGH (ref 101–111)
Creatinine, Ser: 0.92 mg/dL (ref 0.44–1.00)
GFR calc Af Amer: >60 mL/min (ref >60)
GFR calc non Af Amer: 59 mL/min — ABNORMAL LOW (ref >60)
Glucose, Bld: 130 mg/dL — ABNORMAL HIGH (ref 65–99)
Potassium: 4.3 mmol/L (ref 3.5–5.1)
Sodium: 140 mmol/L (ref 135–145)

## 2016-07-20 NOTE — Progress Notes (Signed)
PROGRESS NOTE  SYERRA ABDELRAHMAN ZOX:[id number] DOB: 1941/08/26 DOA: 07/18/2016 PCP: Martha Clan, MD   LOS: 2 days   Brief Narrative: [name] [name] is a 75 y.o. female with medical history significant for hypothyroidism, heart transplant, hypertension, depression, anxiety, history of venous thrombus embolism who presented to the emergency room with a chief complaint of abdominal pain. Patient had acute onset of severe abdominal pain in the early morning. The pain did not improve. Patient contacted family who then had her brought to the emergency room by car. Patient denies any nausea vomiting diarrhea. Patient has had no fever or chills. Patient denies any sick contacts. Patient denies a trauma to the abdomen. Patient does not have a history of diverticulosis, diverticulitis or colitis. Patient does not have a history of irritable bowel disease.  Assessment & Plan: Principal Problem:   Colitis Active Problems:   Depression   Heart transplanted (HCC)   HTN (hypertension)   Colitis, C diff positive - patient with clinical and radiologic findings of colitis, C diff Ag positive, toxin negative. Given watery diarrhea, abdominal pain and CT findings, will start po Vancomycin  - Enteric precautions - Improving today, wants to eat more, advanced diet  Heart transplant - Continue immunosuppressive drugs - Continue aspirin  Hypertension - When necessary hydralazine 10 mg IV as needed for severe blood pressure - Continue outpatient Cozaar  Depression with anxiety - No SI or HI  GERD - stopped protonix given C diff  Low iron - Hold outpatient iron  Hypothyroid - Continue Synthroid    DVT prophylaxis: heparin Code Status: Full Family Communication: no family bedside,  Disposition Plan: home when ready, likely one day  Consultants:   None   Procedures:   None   Antimicrobials:  Ciprofloxacin 11/21 >> 11/22  Flagyl 11/21 >> 11/22  Po Vancomycin 11/22 >>    Subjective: - no chest pain, shortness of breath - abdominal pain improving  Objective: Vitals:   07/19/16 0859 07/19/16 1353 07/19/16 2203 07/20/16 0506  BP: (!) 142/56 (!) 162/61 (!) 159/73 (!) 162/74  Pulse: 82 84 98 91  Resp:   18 20  Temp:  98.6 F (37 C) 98.7 F (37.1 C) 98.7 F (37.1 C)  TempSrc:  Oral Oral Oral  SpO2:  100% 98% 98%  Weight:      Height:        Intake/Output Summary (Last 24 hours) at 07/20/16 1048 Last data filed at 07/20/16 1018  Gross per 24 hour  Intake          3146.25 ml  Output                0 ml  Net          3146.25 ml   Filed Weights   07/18/16 1232 07/19/16 0456  Weight: 77.1 kg (170 lb) 76.9 kg (169 lb 8 oz)    Examination: Constitutional: NAD Vitals:   07/19/16 0859 07/19/16 1353 07/19/16 2203 07/20/16 0506  BP: (!) 142/56 (!) 162/61 (!) 159/73 (!) 162/74  Pulse: 82 84 98 91  Resp:   18 20  Temp:  98.6 F (37 C) 98.7 F (37.1 C) 98.7 F (37.1 C)  TempSrc:  Oral Oral Oral  SpO2:  100% 98% 98%  Weight:      Height:       Eyes: PERRL Respiratory: clear to auscultation bilaterally, no wheezing, no crackles. Cardiovascular: Regular rate and rhythm, no murmurs / rubs / gallops.  Abdomen: no  tenderness. Bowel sounds positive.  Musculoskeletal: no clubbing / cyanosis   Data Reviewed: I have personally reviewed following labs and imaging studies  CBC:  Recent Labs Lab 07/18/16 1252 07/18/16 1317 07/19/16 0921 07/20/16 0449  WBC 13.2*  --  16.8* 14.0*  NEUTROABS 10.0*  --   --   --   HGB 14.0 15.3* 12.6 11.9*  HCT 42.6 45.0 39.2 38.2  MCV 80.7  --  82.2 82.5  PLT 308  --  315 272   Basic Metabolic Panel:  Recent Labs Lab 07/18/16 1252 07/18/16 1317 07/19/16 0921 07/20/16 0449  NA 137 140 139 140  K 3.5 3.4* 3.5 4.3  CL 110 107 112* 112*  CO2 17*  --  20* 22  GLUCOSE 147* 142* 161* 130*  BUN 15 16 8  5*  CREATININE 0.93 0.80 1.00 0.92  CALCIUM 9.4  --  7.9* 8.1*   GFR: Estimated Creatinine  Clearance: 50.3 mL/min (by C-G formula based on SCr of 0.92 mg/dL). Liver Function Tests:  Recent Labs Lab 07/18/16 1252  AST 22  ALT 15  ALKPHOS 80  BILITOT 0.6  PROT 6.9  ALBUMIN 3.8    Recent Labs Lab 07/18/16 1252  LIPASE 22   No results for input(s): AMMONIA in the last 168 hours. Coagulation Profile: No results for input(s): INR, PROTIME in the last 168 hours. Cardiac Enzymes:  Recent Labs Lab 07/19/16 0118 07/19/16 0921 07/19/16 1445  TROPONINI <0.03 <0.03 <0.03   BNP (last 3 results) No results for input(s): PROBNP in the last 8760 hours. HbA1C: No results for input(s): HGBA1C in the last 72 hours. CBG: No results for input(s): GLUCAP in the last 168 hours. Lipid Profile: No results for input(s): CHOL, HDL, LDLCALC, TRIG, CHOLHDL, LDLDIRECT in the last 72 hours. Thyroid Function Tests: No results for input(s): TSH, T4TOTAL, FREET4, T3FREE, THYROIDAB in the last 72 hours. Anemia Panel: No results for input(s): VITAMINB12, FOLATE, FERRITIN, TIBC, IRON, RETICCTPCT in the last 72 hours. Urine analysis:    Component Value Date/Time   COLORURINE YELLOW 07/18/2016 1530   APPEARANCEUR CLEAR 07/18/2016 1530   LABSPEC >1.046 (H) 07/18/2016 1530   PHURINE 6.0 07/18/2016 1530   GLUCOSEU NEGATIVE 07/18/2016 1530   HGBUR NEGATIVE 07/18/2016 1530   BILIRUBINUR NEGATIVE 07/18/2016 1530   KETONESUR 15 (A) 07/18/2016 1530   PROTEINUR NEGATIVE 07/18/2016 1530   UROBILINOGEN 0.2 06/07/2007 1601   NITRITE NEGATIVE 07/18/2016 1530   LEUKOCYTESUR NEGATIVE 07/18/2016 1530   Sepsis Labs: Invalid input(s): PROCALCITONIN, LACTICIDVEN  Recent Results (from the past 240 hour(s))  C difficile quick scan w PCR reflex     Status: Abnormal   Collection Time: 07/18/16 10:16 PM  Result Value Ref Range Status   C Diff antigen POSITIVE (A) NEGATIVE Final   C Diff toxin NEGATIVE NEGATIVE Final   C Diff interpretation Results are indeterminate. See PCR results.  Final   Clostridium Difficile by PCR     Status: Abnormal   Collection Time: 07/18/16 10:16 PM  Result Value Ref Range Status   Toxigenic C Difficile by pcr POSITIVE (A) NEGATIVE Final    Comment: Positive for toxigenic C. difficile with little to no toxin production. Only treat if clinical presentation suggests symptomatic illness.      Radiology Studies: Ct Abdomen Pelvis W Contrast  Result Date: 07/18/2016 CLINICAL DATA:  Left lower quadrant pain. History of heart transplant. History of hysterectomy and appendectomy. EXAM: CT ABDOMEN AND PELVIS WITH CONTRAST TECHNIQUE: Multidetector CT imaging of the  abdomen and pelvis was performed using the standard protocol following bolus administration of intravenous contrast. CONTRAST:  ISOVUE-300 IOPAMIDOL (ISOVUE-300) INJECTION 61% COMPARISON:  04/19/2016 2007 FINDINGS: Lower chest: Bibasilar atelectasis. No pleural effusions. Median sternotomy wires are present. Hepatobiliary: Normal appearance of the liver, gallbladder and portal venous system. Pancreas: Normal appearance of the pancreas without inflammation or duct dilatation. Spleen: Normal appearance of spleen without enlargement. Adrenals/Urinary Tract: Normal adrenal glands. Mild atrophy in the right kidney. No hydronephrosis. Mild distention of the urinary bladder without gross abnormality. Normal appearance of the left kidney without hydronephrosis. Stomach/Bowel: Small to moderate sized hiatal hernia. Abnormal location of the duodenum-jejunal junction. 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 This bowel configuration is similar to the previous examination. There is a large amount of the small bowel in the right upper abdomen. Diverticula involving the sigmoid colon without inflammation in the sigmoid colon. However, there is mild wall thickening in the ascending colon, splenic flexure and transverse colon. Findings are suggestive for  mild colitis. There is also distention of the cecum which is located in the right upper quadrant. No gross abnormality to the ileocecal valve area. Diverticula involving the descending colon. Mild pericolonic stranding involving the descending colon. 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 Atherosclerotic disease in the aorta without aneurysm. No significant lymph node enlargement in the abdomen or pelvis. Reproductive: Status post hysterectomy. No adnexal masses. Other: Minimal fluid or edema adjacent to the descending colon on sequence 201, image 57. No significant free fluid in the pelvis. Musculoskeletal: No acute bone abnormality. IMPRESSION: Mild colonic wall thickening and pericolonic stranding in the left abdomen. Findings are most compatible with colitis involving the descending colon and portion of the transverse colon. There are scattered diverticuli in the left colon but findings are not suggestive for acute diverticulitis. Moderate distention of the cecum which is located in the right upper abdomen. Congenital bowel malrotation. These findings are similar to the previous examination. Small to moderate sized hiatal hernia. Aortic atherosclerosis. There is focal plaque along the anterior aspect of the supraceliac aorta. Electronically Signed   By: Richarda Overlie M.D.   On: 07/18/2016 14:32   Dg Chest Port 1 View  Result Date: 07/19/2016 CLINICAL DATA:  Acute onset of hypoxia.  Initial encounter. EXAM: PORTABLE CHEST 1 VIEW COMPARISON:  Chest radiograph from 06/09/2007 FINDINGS: There is elevation of the right hemidiaphragm. There is no evidence of focal opacification, pleural effusion or pneumothorax. The cardiomediastinal silhouette is mildly enlarged. The patient is status post median sternotomy. No acute osseous abnormalities are seen. IMPRESSION: Elevation of the right hemidiaphragm. Lungs otherwise grossly clear. Mild cardiomegaly. Electronically Signed    By: Roanna Raider M.D.   On: 07/19/2016 02:02     Scheduled Meds: . aspirin EC  81 mg Oral Daily  . diltiazem  120 mg Oral BID  . escitalopram  20 mg Oral QHS  . heparin  5,000 Units Subcutaneous Q8H  . levothyroxine  50 mcg Oral QAC breakfast  . losartan  50 mg Oral BID  .  morphine injection  4 mg Intravenous Once  . mycophenolate  250 mg Oral BID  . ondansetron (ZOFRAN) IV  4 mg Intravenous Once  . pantoprazole  40 mg Oral Daily  . tacrolimus  0.5 mg Oral BID  . vancomycin  125 mg Oral QID  . [START ON 07/21/2016] Vitamin D (Ergocalciferol)  50,000 Units Oral Q  Fri   Continuous Infusions:     Pamella Pertostin Gherghe, MD, PhD [facility] Pager [phone]  If 7PM-7AM, please contact night-coverage [email or web address] Password [email or web address] 07/20/2016, 10:48 AM

## 2016-07-20 NOTE — Progress Notes (Signed)
Patient given morphine for abdominal pain.  30 min later, patient called out stating her arm was itching and there was redness around arm.  IV still patent and flushes easily, no pain, shortness of breath, or rash.  Paged Dr. Elvera LennoxGherghe. Will continue to monitor.

## 2016-07-21 LAB — BASIC METABOLIC PANEL
Anion gap: 6 (ref 5–15)
BUN: 8 mg/dL (ref 6–20)
CO2: 21 mmol/L — ABNORMAL LOW (ref 22–32)
Calcium: 8.7 mg/dL — ABNORMAL LOW (ref 8.9–10.3)
Chloride: 112 mmol/L — ABNORMAL HIGH (ref 101–111)
Creatinine, Ser: 0.98 mg/dL (ref 0.44–1.00)
GFR calc Af Amer: >60 mL/min (ref >60)
GFR calc non Af Amer: 54 mL/min — ABNORMAL LOW (ref >60)
Glucose, Bld: 99 mg/dL (ref 65–99)
Potassium: 4.3 mmol/L (ref 3.5–5.1)
Sodium: 139 mmol/L (ref 135–145)

## 2016-07-21 LAB — MAGNESIUM: Magnesium: 1.5 mg/dL — ABNORMAL LOW (ref 1.7–2.4)

## 2016-07-21 MED ORDER — MAGNESIUM SULFATE 2 GM/50ML IV SOLN
2.0000 g | Freq: Once | INTRAVENOUS | Status: AC
  Administered 2016-07-21: 2 g via INTRAVENOUS
  Filled 2016-07-21: qty 50

## 2016-07-21 MED ORDER — VANCOMYCIN 50 MG/ML ORAL SOLUTION
125.0000 mg | Freq: Four times a day (QID) | ORAL | Status: DC
  Administered 2016-07-21 – 2016-07-22 (×4): 125 mg via ORAL
  Filled 2016-07-21 (×5): qty 2.5

## 2016-07-21 NOTE — Progress Notes (Signed)
PROGRESS NOTE  BANESA TRISTAN ZOX:[id number] DOB: 1941/01/18 DOA: 07/18/2016 PCP: Martha Clan, MD   LOS: 3 days   Brief Narrative: [name] [name] is a 75 y.o. female with medical history significant for hypothyroidism, heart transplant, hypertension, depression, anxiety, history of venous thrombus embolism who presented to the emergency room with a chief complaint of abdominal pain. Patient had acute onset of severe abdominal pain in the early morning. The pain did not improve. Patient contacted family who then had her brought to the emergency room by car. Patient denies any nausea vomiting diarrhea. Patient has had no fever or chills. Patient denies any sick contacts. Patient denies a trauma to the abdomen. Patient does not have a history of diverticulosis, diverticulitis or colitis. Patient does not have a history of irritable bowel disease.  Assessment & Plan: Principal Problem:   Colitis Active Problems:   Depression   Heart transplanted (HCC)   HTN (hypertension)   Colitis, C diff positive - patient with clinical and radiologic findings of colitis, C diff Ag positive, toxin negative. Given watery diarrhea, abdominal pain and CT findings, will start po Vancomycin  - Enteric precautions - Patient's diarrhea initially improved yesterday, however she is having more loose watery bowel movements today, continue to monitor, not ready for d/c  Heart transplant - Continue immunosuppressive drugs - Continue aspirin  Hypertension - When necessary hydralazine 10 mg IV as needed for severe blood pressure - Continue outpatient Cozaar  Depression with anxiety - No SI or HI  GERD - stopped protonix given C diff  Low iron - Hold outpatient iron  Hypothyroid - Continue Synthroid   DVT prophylaxis: heparin Code Status: Full Family Communication: no family bedside,  Disposition Plan: home when ready, likely 1 day  Consultants:   None   Procedures:   None    Antimicrobials:  Ciprofloxacin 11/21 >> 11/22  Flagyl 11/21 >> 11/22  Po Vancomycin 11/22 >>   Subjective: - no chest pain, shortness of breath - abdominal pain improving  Objective: Vitals:   07/20/16 1451 07/20/16 2131 07/21/16 0500 07/21/16 0618  BP: 108/80 137/66  (!) 123/49  Pulse: 90 77  67  Resp: 20 18  18   Temp: 98.2 F (36.8 C) 98.6 F (37 C)  98.8 F (37.1 C)  TempSrc: Oral Oral  Oral  SpO2: 97% 98%  95%  Weight:   76.4 kg (168 lb 8 oz)   Height:        Intake/Output Summary (Last 24 hours) at 07/21/16 1432 Last data filed at 07/20/16 2200  Gross per 24 hour  Intake              110 ml  Output                0 ml  Net              110 ml   Filed Weights   07/18/16 1232 07/19/16 0456 07/21/16 0500  Weight: 77.1 kg (170 lb) 76.9 kg (169 lb 8 oz) 76.4 kg (168 lb 8 oz)    Examination: Constitutional: NAD Vitals:   07/20/16 1451 07/20/16 2131 07/21/16 0500 07/21/16 0618  BP: 108/80 137/66  (!) 123/49  Pulse: 90 77  67  Resp: 20 18  18   Temp: 98.2 F (36.8 C) 98.6 F (37 C)  98.8 F (37.1 C)  TempSrc: Oral Oral  Oral  SpO2: 97% 98%  95%  Weight:   76.4 kg (168 lb 8 oz)  Height:       Eyes: PERRL Respiratory: clear to auscultation bilaterally, no wheezing, no crackles. Cardiovascular: Regular rate and rhythm, no murmurs / rubs / gallops.  Abdomen: no tenderness. Bowel sounds positive.  Musculoskeletal: no clubbing / cyanosis   Data Reviewed: I have personally reviewed following labs and imaging studies  CBC:  Recent Labs Lab 07/18/16 1252 07/18/16 1317 07/19/16 0921 07/20/16 0449  WBC 13.2*  --  16.8* 14.0*  NEUTROABS 10.0*  --   --   --   HGB 14.0 15.3* 12.6 11.9*  HCT 42.6 45.0 39.2 38.2  MCV 80.7  --  82.2 82.5  PLT 308  --  315 272   Basic Metabolic Panel:  Recent Labs Lab 07/18/16 1252 07/18/16 1317 07/19/16 0921 07/20/16 0449 07/21/16 1027  NA 137 140 139 140 139  K 3.5 3.4* 3.5 4.3 4.3  CL 110 107 112* 112* 112*   CO2 17*  --  20* 22 21*  GLUCOSE 147* 142* 161* 130* 99  BUN 15 16 8  5* 8  CREATININE 0.93 0.80 1.00 0.92 0.98  CALCIUM 9.4  --  7.9* 8.1* 8.7*  MG  --   --   --   --  1.5*   GFR: Estimated Creatinine Clearance: 47.1 mL/min (by C-G formula based on SCr of 0.98 mg/dL). Liver Function Tests:  Recent Labs Lab 07/18/16 1252  AST 22  ALT 15  ALKPHOS 80  BILITOT 0.6  PROT 6.9  ALBUMIN 3.8    Recent Labs Lab 07/18/16 1252  LIPASE 22   No results for input(s): AMMONIA in the last 168 hours. Coagulation Profile: No results for input(s): INR, PROTIME in the last 168 hours. Cardiac Enzymes:  Recent Labs Lab 07/19/16 0118 07/19/16 0921 07/19/16 1445  TROPONINI <0.03 <0.03 <0.03   BNP (last 3 results) No results for input(s): PROBNP in the last 8760 hours. HbA1C: No results for input(s): HGBA1C in the last 72 hours. CBG: No results for input(s): GLUCAP in the last 168 hours. Lipid Profile: No results for input(s): CHOL, HDL, LDLCALC, TRIG, CHOLHDL, LDLDIRECT in the last 72 hours. Thyroid Function Tests: No results for input(s): TSH, T4TOTAL, FREET4, T3FREE, THYROIDAB in the last 72 hours. Anemia Panel: No results for input(s): VITAMINB12, FOLATE, FERRITIN, TIBC, IRON, RETICCTPCT in the last 72 hours. Urine analysis:    Component Value Date/Time   COLORURINE YELLOW 07/18/2016 1530   APPEARANCEUR CLEAR 07/18/2016 1530   LABSPEC >1.046 (H) 07/18/2016 1530   PHURINE 6.0 07/18/2016 1530   GLUCOSEU NEGATIVE 07/18/2016 1530   HGBUR NEGATIVE 07/18/2016 1530   BILIRUBINUR NEGATIVE 07/18/2016 1530   KETONESUR 15 (A) 07/18/2016 1530   PROTEINUR NEGATIVE 07/18/2016 1530   UROBILINOGEN 0.2 06/07/2007 1601   NITRITE NEGATIVE 07/18/2016 1530   LEUKOCYTESUR NEGATIVE 07/18/2016 1530   Sepsis Labs: Invalid input(s): PROCALCITONIN, LACTICIDVEN  Recent Results (from the past 240 hour(s))  C difficile quick scan w PCR reflex     Status: Abnormal   Collection Time: 07/18/16  10:16 PM  Result Value Ref Range Status   C Diff antigen POSITIVE (A) NEGATIVE Final   C Diff toxin NEGATIVE NEGATIVE Final   C Diff interpretation Results are indeterminate. See PCR results.  Final  Clostridium Difficile by PCR     Status: Abnormal   Collection Time: 07/18/16 10:16 PM  Result Value Ref Range Status   Toxigenic C Difficile by pcr POSITIVE (A) NEGATIVE Final    Comment: Positive for toxigenic C. difficile with little  to no toxin production. Only treat if clinical presentation suggests symptomatic illness.      Radiology Studies: No results found.   Scheduled Meds: . aspirin EC  81 mg Oral Daily  . diltiazem  120 mg Oral BID  . escitalopram  20 mg Oral QHS  . heparin  5,000 Units Subcutaneous Q8H  . levothyroxine  50 mcg Oral QAC breakfast  . losartan  50 mg Oral BID  .  morphine injection  4 mg Intravenous Once  . mycophenolate  250 mg Oral BID  . ondansetron (ZOFRAN) IV  4 mg Intravenous Once  . pantoprazole  40 mg Oral Daily  . tacrolimus  0.5 mg Oral BID  . vancomycin  125 mg Oral Q6H  . Vitamin D (Ergocalciferol)  50,000 Units Oral Q Fri   Continuous Infusions:     Pamella Pertostin Jerrika Ledlow, MD, PhD [facility] Pager [phone]  If 7PM-7AM, please contact night-coverage [email or web address] Password [email or web address] 07/21/2016, 2:32 PM

## 2016-07-21 NOTE — Evaluation (Signed)
Physical Therapy Evaluation Patient Details Name: [name] [name] MRN: [id number] DOB: 24-Dec-1940 Today's Date: 07/21/2016   History of Present Illness  75 y.o. female [facility] for abdominal pain.  Found to have C-diff colitis.  Pt with significant PMHx of pacemaker, HTN, heart transplant, DVT, CHF, A-fib, and  ablation.    Clinical Impression  Pt is generally weak and deconditioned due to acute illness.  She is independent in her room and reports walking in hallway with RN staff.  She is concerned that she won't have the strength to get up to her bedroom and this will be our one goal with PT.  She was unable to attempt right now due to feeling the need to go to the bathroom urgently.  PT will attempt back later today or tomorrow AM to see if we can practice stairs. PT to follow acutely until d/c confirmed.       Follow Up Recommendations No PT follow up    Equipment Recommendations  None recommended by PT    Recommendations for Other Services   NA    Precautions / Restrictions Precautions Precautions: None Restrictions Weight Bearing Restrictions: No      Mobility  Bed Mobility Overal bed mobility: Modified Independent                Transfers Overall transfer level: Modified independent Equipment used: None                Ambulation/Gait Ambulation/Gait assistance: Modified independent (Device/Increase time) Ambulation Distance (Feet): 20 Feet Assistive device: None Gait Pattern/deviations: WFL(Within Functional Limits) Gait velocity: decreased   General Gait Details: Pt reports a bit slower than usual and generally feels weak on her feet         Balance Overall balance assessment: No apparent balance deficits (not formally assessed)                                           Pertinent Vitals/Pain Pain Assessment: No/denies pain    Home Living Family/patient expects to be discharged to:: Private  residence Living Arrangements: Spouse/significant other   Type of Home: House Home Access: Stairs to enter Entrance Stairs-Rails: None Entrance Stairs-Number of Steps: 3 Home Layout: Two level;1/2 bath on main level Home Equipment: None      Prior Function Level of Independence: Independent         Comments: drives        Extremity/Trunk Assessment   Upper Extremity Assessment: Generalized weakness           Lower Extremity Assessment: Generalized weakness      Cervical / Trunk Assessment: Normal  Communication   Communication: No difficulties  Cognition Arousal/Alertness: Awake/alert Behavior During Therapy: WFL for tasks assessed/performed Overall Cognitive Status: Within Functional Limits for tasks assessed                             Assessment/Plan    PT Assessment Patient needs continued PT services  PT Problem List Decreased activity tolerance;Decreased strength          PT Treatment Interventions DME instruction;Gait training;Stair training;Functional mobility training;Therapeutic activities;Therapeutic exercise;Patient/family education    PT Goals (Current goals can be found in the Care Plan section)  Acute Rehab PT Goals Patient Stated Goal: to get back to her PLOF PT Goal  Formulation: With patient Time For Goal Achievement: 08/04/16 Potential to Achieve Goals: Good    Frequency Min 3X/week           End of Session   Activity Tolerance: Other (comment) (pt limited by feeling like she has to go to bathroom) Patient left: in bed;with call bell/phone within reach           Time: 6213-08651057-1117 PT Time Calculation (min) (ACUTE ONLY): 20 min   Charges:   PT Evaluation $PT Eval Moderate Complexity: 1 Procedure          Britney Newstrom B. Yoan Sallade, PT, DPT [phone]   07/21/2016, 11:27 AM

## 2016-07-21 NOTE — Progress Notes (Signed)
Physical Therapy Treatment Patient Details Name: Laural BenesCarol M [name] MRN: [id number] DOB: 20-Mar-1941 Today's Date: 07/21/2016    History of Present Illness 75 y.o. female [facility] for abdominal pain.  Found to have C-diff colitis.  Pt with significant PMHx of pacemaker, HTN, heart transplant, DVT, CHF, A-fib, and  ablation.      PT Comments    PT came back to try to do stairs with pt this PM, however, she is feeling worse, weak and lightheaded.  Orthostatics taken and were negative (see vitals flow sheet), she also needed more assist to mobilize EOB and to stand.  Deferred stairs at this time.  RN in room assessing pt.    Follow Up Recommendations  No PT follow up     Equipment Recommendations  None recommended by PT    Recommendations for Other Services   NA     Precautions / Restrictions    None   Mobility  Bed Mobility Overal bed mobility: Needs Assistance Bed Mobility: Supine to Sit;Sit to Supine     Supine to sit: Min guard Sit to supine: Min guard   General bed mobility comments: Min guard assist to help pt get EOB and back in bed.  Much more effortful this PM.   Transfers Overall transfer level: Needs assistance Equipment used: 1 person hand held assist Transfers: Sit to/from Stand Sit to Stand: Min assist         General transfer comment: Min assist to support trunk as pt came to stand EOB for orthostatic vitals.  Pt is much more pale than this AM.  Reports eating a "little bit" of lunch.  Orthostatics negative.  RN inputing them in vitals flow sheet.   Ambulation/Gait             General Gait Details: NT due to pt feeling ill          Balance Overall balance assessment: Needs assistance Sitting-balance support: Feet supported;No upper extremity supported Sitting balance-Leahy Scale: Good     Standing balance support: Single extremity supported Standing balance-Leahy Scale: Fair                      Cognition  Arousal/Alertness: Awake/alert Behavior During Therapy: WFL for tasks assessed/performed Overall Cognitive Status: Within Functional Limits for tasks assessed                             Pertinent Vitals/Pain Pain Assessment: No/denies pain           PT Goals (current goals can now be found in the care plan section) Acute Rehab PT Goals Patient Stated Goal: to get back to her PLOF Progress towards PT goals: Not progressing toward goals - comment (feels more weak this PM)    Frequency    Min 3X/week      PT Plan Current plan remains appropriate       End of Session   Activity Tolerance: Other (comment) (limited by feeling weak/mailiase) Patient left: in bed;with call bell/phone within reach;with nursing/sitter in room     Time: [id number] PT Time Calculation (min) (ACUTE ONLY): 10 min  Charges:  $Therapeutic Activity: 8-22 mins                      [name] [name], PT, DPT [phone]   07/21/2016, 3:22 PM

## 2016-07-22 LAB — BASIC METABOLIC PANEL
Anion gap: 6 (ref 5–15)
BUN: 11 mg/dL (ref 6–20)
CO2: 21 mmol/L — ABNORMAL LOW (ref 22–32)
Calcium: 8.2 mg/dL — ABNORMAL LOW (ref 8.9–10.3)
Chloride: 111 mmol/L (ref 101–111)
Creatinine, Ser: 1.02 mg/dL — ABNORMAL HIGH (ref 0.44–1.00)
GFR calc Af Amer: 60 mL/min — ABNORMAL LOW (ref >60)
GFR calc non Af Amer: 52 mL/min — ABNORMAL LOW (ref >60)
Glucose, Bld: 107 mg/dL — ABNORMAL HIGH (ref 65–99)
Potassium: 3.7 mmol/L (ref 3.5–5.1)
Sodium: 138 mmol/L (ref 135–145)

## 2016-07-22 LAB — MAGNESIUM: Magnesium: 1.9 mg/dL (ref 1.7–2.4)

## 2016-07-22 MED ORDER — VANCOMYCIN 50 MG/ML ORAL SOLUTION: 125.0000 mg | Freq: Four times a day (QID) | ORAL | 0 refills | Status: DC

## 2016-07-22 NOTE — Discharge Instructions (Signed)
Follow with Martha ClanShaw, William, MD in 1-2 weeks  Please get a complete blood count and chemistry panel checked by your Primary MD at your next visit, and again as instructed by your Primary MD. Please get your medications reviewed and adjusted by your Primary MD.  Please request your Primary MD to go over all Hospital Tests and Procedure/Radiological results at the follow up, please get all Hospital records sent to your Prim MD by signing hospital release before you go home.  If you had Pneumonia of Lung problems at the Hospital: Please get a 2 view Chest X ray done in 6-8 weeks after hospital discharge or sooner if instructed by your Primary MD.  If you have Congestive Heart Failure: Please call your Cardiologist or Primary MD anytime you have any of the following symptoms:  1) 3 pound weight gain in 24 hours or 5 pounds in 1 week  2) shortness of breath, with or without a dry hacking cough  3) swelling in the hands, feet or stomach  4) if you have to sleep on extra pillows at night in order to breathe  Follow cardiac low salt diet and 1.5 lit/day fluid restriction.  If you have diabetes Accuchecks 4 times/day, Once in AM empty stomach and then before each meal. Log in all results and show them to your primary doctor at your next visit. If any glucose reading is under 80 or above 300 call your primary MD immediately.  If you have Seizure/Convulsions/Epilepsy: Please do not drive, operate heavy machinery, participate in activities at heights or participate in high speed sports until you have seen by Primary MD or a Neurologist and advised to do so again.  If you had Gastrointestinal Bleeding: Please ask your Primary MD to check a complete blood count within one week of discharge or at your next visit. Your endoscopic/colonoscopic biopsies that are pending at the time of discharge, will also need to followed by your Primary MD.  Get Medicines reviewed and adjusted. Please take all your  medications with you for your next visit with your Primary MD  Please request your Primary MD to go over all hospital tests and procedure/radiological results at the follow up, please ask your Primary MD to get all Hospital records sent to his/her office.  If you experience worsening of your admission symptoms, develop shortness of breath, life threatening emergency, suicidal or homicidal thoughts you must seek medical attention immediately by calling 911 or calling your MD immediately  if symptoms less severe.  You must read complete instructions/literature along with all the possible adverse reactions/side effects for all the Medicines you take and that have been prescribed to you. Take any new Medicines after you have completely understood and accpet all the possible adverse reactions/side effects.   Do not drive or operate heavy machinery when taking Pain medications.   Do not take more than prescribed Pain, Sleep and Anxiety Medications  Special Instructions: If you have smoked or chewed Tobacco  in the last 2 yrs please stop smoking, stop any regular Alcohol  and or any Recreational drug use.  Wear Seat belts while driving.  Please note You were cared for by a hospitalist during your hospital stay. If you have any questions about your discharge medications or the care you received while you were in the hospital after you are discharged, you can call the unit and asked to speak with the hospitalist on call if the hospitalist that took care of you is not available. Once  you are discharged, your primary care physician will handle any further medical issues. Please note that NO REFILLS for any discharge medications will be authorized once you are discharged, as it is imperative that you return to your primary care physician (or establish a relationship with a primary care physician if you do not have one) for your aftercare needs so that they can reassess your need for medications and monitor your  lab values.  You can reach the hospitalist office at phone [phone] or fax [phone]   If you do not have a primary care physician, you can call [phone] for a physician referral.  Activity: As tolerated with Full fall precautions use walker/cane & assistance as needed  Diet: heart healthy  Disposition Home

## 2016-07-22 NOTE — Progress Notes (Signed)
Patient requested to call in medication to her pharmacy, [facility] on [address]. Pharmacy stated that liquid oral Vanc must be taken to compounding pharmacy. [facility], I called to confirm she could fill rx there. Patient verbalizes understanding.

## 2016-07-22 NOTE — Discharge Summary (Signed)
Physician Discharge Summary  [name] [name] ZOX:[id number] DOB: Feb 16, 1941 DOA: 07/18/2016  PCP: Martha Clan, MD  Admit date: 07/18/2016 Discharge date: 07/22/2016  Admitted From: home Disposition:  home  Recommendations for Outpatient Follow-up:  1. Follow up with PCP in 1-2 weeks 2. Continue po vancomycin for 11 more days to complete a 14 day course  Home Health: none Equipment/Devices: none  Discharge Condition: stable CODE STATUS: Full Diet recommendation: heart healthy  HPI: Per Dr. Melynda Ripple, [name] [name] is a 75 y.o. female pedicle medical history significant for hypothyroidism, heart transplant, hypertension, depression, anxiety, history of venous thrombus embolism who presented to the emergency room with a chief complaint of abdominal pain. Patient had acute onset of severe abdominal pain in the early morning. The pain did not improve. Patient contacted family who then had her brought to the emergency room by car. Patient denies any nausea vomiting diarrhea. Patient has had no fever or chills. Patient denies any sick contacts. Patient denies a trauma to the abdomen. Patient does not have a history of diverticulosis, diverticulitis or colitis. Patient does not have a history of irritable bowel disease.  Hospital Course: Discharge Diagnoses:  Principal Problem:   Colitis Active Problems:   Depression   Heart transplanted (HCC)   HTN (hypertension)  Colitis, C diff positive - Patient was admitted to the hospital with abdominal pain, nausea as well as significant watery diarrhea. She underwent testing for C. difficile which returned antigen positive and toxin negative. However, given clinical and radiologic findings of colitis, as well as watery diarrhea, treatment is indicated. She was started on by mouth vancomycin, with subsequent improvement in her abdominal pain, she was able to have her diet advanced to regular and tolerated that well. Her diarrhea resolved. She was  discharged home in stable condition and is to complete 11 additional days of by mouth vancomycin. Heart transplant - Continue immunosuppressive drugs, Continue aspirin Hypertension - continue home medications Depression with anxiety - continue home medications GERD - stopped protonix given C diff Hypothyroid - Continue Synthroid   Discharge Instructions     Medication List    STOP taking these medications   esomeprazole 20 MG capsule Commonly known as:  NEXIUM     TAKE these medications   aspirin 81 MG tablet Take 81 mg by mouth daily.   clonazePAM 1 MG tablet Commonly known as:  KLONOPIN Take 1 mg by mouth at bedtime.   conjugated estrogens vaginal cream Commonly known as:  PREMARIN Place 1 Applicatorful vaginally 2 (two) times a week.   cyanocobalamin 500 MCG tablet Take 500 mcg by mouth daily.   diltiazem 120 MG 24 hr capsule Commonly known as:  CARDIZEM CD Take 120 mg by mouth 2 (two) times daily.   escitalopram 20 MG tablet Commonly known as:  LEXAPRO Take 20 mg by mouth daily.   iron polysaccharides 150 MG capsule Commonly known as:  NIFEREX Take 150 mg by mouth daily.   levothyroxine 50 MCG tablet Commonly known as:  SYNTHROID, LEVOTHROID Take 50 mcg by mouth daily before breakfast.   loperamide 2 MG capsule Commonly known as:  IMODIUM Take 2 mg by mouth as needed for diarrhea or loose stools.   losartan 50 MG tablet Commonly known as:  COZAAR Take 50 mg by mouth 2 (two) times daily.   magnesium oxide 400 MG tablet Commonly known as:  MAG-OX Take 400 mg by mouth daily.   MULTIVITAMIN PO Take 1 tablet by mouth daily.  mycophenolate 250 MG capsule Commonly known as:  CELLCEPT Take 250 mg by mouth 2 (two) times daily.   tacrolimus 0.5 MG capsule Commonly known as:  PROGRAF Take 0.5 mg by mouth 2 (two) times daily.   vancomycin 50 mg/mL oral solution Commonly known as:  VANCOCIN Take 2.5 mLs (125 mg total) by mouth every 6 (six) hours.  For 11 days   Vitamin D (Ergocalciferol) 50000 units Caps capsule Commonly known as:  DRISDOL Take 50,000 Units by mouth every Friday.      Follow-up Information    Martha ClanShaw, William, MD. Schedule an appointment as soon as possible for a visit in 2 week(s).   Specialty:  Internal Medicine Contact information: [address] [place] [address] [phone]          Allergies  Allergen Reactions  . Antihistamines, Chlorpheniramine-Type Other (See Comments)    Numbness  . Codeine Nausea Only    Consultations:  None   Procedures/Studies:  Ct Abdomen Pelvis W Contrast  Result Date: 07/18/2016 CLINICAL DATA:  Left lower quadrant pain. History of heart transplant. History of hysterectomy and appendectomy. EXAM: CT ABDOMEN AND PELVIS WITH CONTRAST TECHNIQUE: Multidetector CT imaging of the abdomen and pelvis was performed using the standard protocol following bolus administration of intravenous contrast. CONTRAST:  100mL ISOVUE-300 IOPAMIDOL (ISOVUE-300) INJECTION 61% COMPARISON:  04/19/2016 2007 FINDINGS: Lower chest: Bibasilar atelectasis. No pleural effusions. Median sternotomy wires are present. Hepatobiliary: Normal appearance of the liver, gallbladder and portal venous system. Pancreas: Normal appearance of the pancreas without inflammation or duct dilatation. Spleen: Normal appearance of spleen without enlargement. Adrenals/Urinary Tract: Normal adrenal glands. Mild atrophy in the right kidney. No hydronephrosis. Mild distention of the urinary bladder without gross abnormality. Normal appearance of the left kidney without hydronephrosis. Stomach/Bowel: Small to moderate sized hiatal hernia. Abnormal location of the duodenum-jejunal junction. 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 This bowel configuration is similar to the previous examination. There is a large amount of the small bowel in the right upper abdomen.  Diverticula involving the sigmoid colon without inflammation in the sigmoid colon. However, there is mild wall thickening in the ascending colon, splenic flexure and transverse colon. Findings are suggestive for mild colitis. There is also distention of the cecum which is located in the right upper quadrant. No gross abnormality to the ileocecal valve area. Diverticula involving the descending colon. Mild pericolonic stranding involving the descending colon. 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 Atherosclerotic disease in the aorta without aneurysm. No significant lymph node enlargement in the abdomen or pelvis. Reproductive: Status post hysterectomy. No adnexal masses. Other: Minimal fluid or edema adjacent to the descending colon on sequence 201, image 57. No significant free fluid in the pelvis. Musculoskeletal: No acute bone abnormality. IMPRESSION: Mild colonic wall thickening and pericolonic stranding in the left abdomen. Findings are most compatible with colitis involving the descending colon and portion of the transverse colon. There are scattered diverticuli in the left colon but findings are not suggestive for acute diverticulitis. Moderate distention of the cecum which is located in the right upper abdomen. Congenital bowel malrotation. These findings are similar to the previous examination. Small to moderate sized hiatal hernia. Aortic atherosclerosis. There is focal plaque along the anterior aspect of the supraceliac aorta. Electronically Signed   By: Richarda OverlieAdam  Henn M.D.   On: 07/18/2016 14:32   Dg Chest Port 1 View  Result Date: 07/19/2016 CLINICAL  DATA:  Acute onset of hypoxia.  Initial encounter. EXAM: PORTABLE CHEST 1 VIEW COMPARISON:  Chest radiograph from 06/09/2007 FINDINGS: There is elevation of the right hemidiaphragm. There is no evidence of focal opacification, pleural effusion or pneumothorax. The cardiomediastinal silhouette is mildly  enlarged. The patient is status post median sternotomy. No acute osseous abnormalities are seen. IMPRESSION: Elevation of the right hemidiaphragm. Lungs otherwise grossly clear. Mild cardiomegaly. Electronically Signed   By: Roanna Raider M.D.   On: 07/19/2016 02:02      Subjective: - no chest pain, shortness of breath, no abdominal pain, nausea or vomiting.   Discharge Exam: Vitals:   07/21/16 2236 07/22/16 0522  BP: (!) 141/65 (!) 121/48  Pulse:  75  Resp:  16  Temp:  98.3 F (36.8 C)   Vitals:   07/21/16 2234 07/21/16 2236 07/22/16 0324 07/22/16 0522  BP: (!) 141/65 (!) 141/65  (!) 121/48  Pulse: 75   75  Resp:    16  Temp:    98.3 F (36.8 C)  TempSrc:    Oral  SpO2:    95%  Weight:   78.4 kg (172 lb 14.4 oz)   Height:        General: Pt is alert, awake, not in acute distress Cardiovascular: RRR, S1/S2 +, no rubs, no gallops Respiratory: CTA bilaterally, no wheezing, no rhonchi Abdominal: Soft, NT, ND, bowel sounds +    The results of significant diagnostics from this hospitalization (including imaging, microbiology, ancillary and laboratory) are listed below for reference.     Microbiology: Recent Results (from the past 240 hour(s))  C difficile quick scan w PCR reflex     Status: Abnormal   Collection Time: 07/18/16 10:16 PM  Result Value Ref Range Status   C Diff antigen POSITIVE (A) NEGATIVE Final   C Diff toxin NEGATIVE NEGATIVE Final   C Diff interpretation Results are indeterminate. See PCR results.  Final  Clostridium Difficile by PCR     Status: Abnormal   Collection Time: 07/18/16 10:16 PM  Result Value Ref Range Status   Toxigenic C Difficile by pcr POSITIVE (A) NEGATIVE Final    Comment: Positive for toxigenic C. difficile with little to no toxin production. Only treat if clinical presentation suggests symptomatic illness.     Labs: BNP (last 3 results) No results for input(s): BNP in the last 8760 hours. Basic Metabolic Panel:  Recent  Labs Lab 07/18/16 1252 07/18/16 1317 07/19/16 0921 07/20/16 0449 07/21/16 1027 07/22/16 0444  NA 137 140 139 140 139 138  K 3.5 3.4* 3.5 4.3 4.3 3.7  CL 110 107 112* 112* 112* 111  CO2 17*  --  20* 22 21* 21*  GLUCOSE 147* 142* 161* 130* 99 107*  BUN 15 16 8  5* 8 11  CREATININE 0.93 0.80 1.00 0.92 0.98 1.02*  CALCIUM 9.4  --  7.9* 8.1* 8.7* 8.2*  MG  --   --   --   --  1.5* 1.9   Liver Function Tests:  Recent Labs Lab 07/18/16 1252  AST 22  ALT 15  ALKPHOS 80  BILITOT 0.6  PROT 6.9  ALBUMIN 3.8    Recent Labs Lab 07/18/16 1252  LIPASE 22   No results for input(s): AMMONIA in the last 168 hours. CBC:  Recent Labs Lab 07/18/16 1252 07/18/16 1317 07/19/16 0921 07/20/16 0449  WBC 13.2*  --  16.8* 14.0*  NEUTROABS 10.0*  --   --   --  HGB 14.0 15.3* 12.6 11.9*  HCT 42.6 45.0 39.2 38.2  MCV 80.7  --  82.2 82.5  PLT 308  --  315 272   Cardiac Enzymes:  Recent Labs Lab 07/19/16 0118 07/19/16 0921 07/19/16 1445  TROPONINI <0.03 <0.03 <0.03   BNP: Invalid input(s): POCBNP CBG: No results for input(s): GLUCAP in the last 168 hours. D-Dimer No results for input(s): DDIMER in the last 72 hours. Hgb A1c No results for input(s): HGBA1C in the last 72 hours. Lipid Profile No results for input(s): CHOL, HDL, LDLCALC, TRIG, CHOLHDL, LDLDIRECT in the last 72 hours. Thyroid function studies No results for input(s): TSH, T4TOTAL, T3FREE, THYROIDAB in the last 72 hours.  Invalid input(s): FREET3 Anemia work up No results for input(s): VITAMINB12, FOLATE, FERRITIN, TIBC, IRON, RETICCTPCT in the last 72 hours. Urinalysis    Component Value Date/Time   COLORURINE YELLOW 07/18/2016 1530   APPEARANCEUR CLEAR 07/18/2016 1530   LABSPEC >1.046 (H) 07/18/2016 1530   PHURINE 6.0 07/18/2016 1530   GLUCOSEU NEGATIVE 07/18/2016 1530   HGBUR NEGATIVE 07/18/2016 1530   BILIRUBINUR NEGATIVE 07/18/2016 1530   KETONESUR 15 (A) 07/18/2016 1530   PROTEINUR NEGATIVE  07/18/2016 1530   UROBILINOGEN 0.2 06/07/2007 1601   NITRITE NEGATIVE 07/18/2016 1530   LEUKOCYTESUR NEGATIVE 07/18/2016 1530   Sepsis Labs Invalid input(s): PROCALCITONIN,  WBC,  LACTICIDVEN Microbiology Recent Results (from the past 240 hour(s))  C difficile quick scan w PCR reflex     Status: Abnormal   Collection Time: 07/18/16 10:16 PM  Result Value Ref Range Status   C Diff antigen POSITIVE (A) NEGATIVE Final   C Diff toxin NEGATIVE NEGATIVE Final   C Diff interpretation Results are indeterminate. See PCR results.  Final  Clostridium Difficile by PCR     Status: Abnormal   Collection Time: 07/18/16 10:16 PM  Result Value Ref Range Status   Toxigenic C Difficile by pcr POSITIVE (A) NEGATIVE Final    Comment: Positive for toxigenic C. difficile with little to no toxin production. Only treat if clinical presentation suggests symptomatic illness.     Time coordinating discharge: Over 30 minutes  SIGNED:  Pamella PertGHERGHE, COSTIN, MD  [facility] 07/22/2016, 3:38 PM Pager [phone]  If 7PM-7AM, please contact night-coverage [email or web address] Password [email or web address]

## 2016-07-22 NOTE — Progress Notes (Signed)
Laural Benesarol M [place] to be D/C'd Home per MD order.  Discussed with the patient and all questions fully answered.  VSS, Skin clean, dry and intact without evidence of skin break down, no evidence of skin tears noted. IV catheter discontinued intact. Site without signs and symptoms of complications. Dressing and pressure applied.  An After Visit Summary was printed and given to the patient. Patient received prescription.  D/c education completed with patient/family including follow up instructions, medication list, d/c activities limitations if indicated, with other d/c instructions as indicated by MD - patient able to verbalize understanding, all questions fully answered.   Patient instructed to return to ED, call 911, or call MD for any changes in condition.   Patient escorted via WC, and D/C home via private auto.  Mariann Lasterva P Othello Dickenson 07/22/2016 3:02 PM

## 2016-07-22 NOTE — Progress Notes (Signed)
Physical Therapy Treatment Patient Details Name: [name] [name] MRN: [id number] DOB: 10-16-1940 Today's Date: 07/22/2016    History of Present Illness 75 y.o. female [facility] for abdominal pain.  Found to have C-diff colitis.  Pt with significant PMHx of pacemaker, HTN, heart transplant, DVT, CHF, A-fib, and  ablation.      PT Comments    Patient is making good progress with PT.  From a mobility standpoint anticipate patient will be ready for DC home when medically ready.     Follow Up Recommendations  No PT follow up     Equipment Recommendations  None recommended by PT    Recommendations for Other Services       Precautions / Restrictions Precautions Precautions: None Restrictions Weight Bearing Restrictions: No    Mobility  Bed Mobility               General bed mobility comments: pt sitting EOB upon arrival  Transfers Overall transfer level: Modified independent Equipment used: None Transfers: Sit to/from Stand              Ambulation/Gait Ambulation/Gait assistance: Supervision Ambulation Distance (Feet): 100 Feet Assistive device: None Gait Pattern/deviations: Step-through pattern;Decreased stride length     General Gait Details: slow, steady gait   Stairs Stairs: Yes Stairs assistance: Supervision Stair Management: One rail Right;Step to pattern;Forwards;Sideways Number of Stairs: 6 General stair comments: cues for step to pattern and safety and descending sideways  Wheelchair Mobility    Modified Rankin (Stroke Patients Only)       Balance     Sitting balance-Leahy Scale: Good       Standing balance-Leahy Scale: Good                      Cognition Arousal/Alertness: Awake/alert Behavior During Therapy: WFL for tasks assessed/performed Overall Cognitive Status: Within Functional Limits for tasks assessed                      Exercises General Exercises - Lower Extremity Long Arc  Quad: AROM;Both;10 reps;Standing Hip ABduction/ADduction: AROM;Both;10 reps;Standing Hip Flexion/Marching: AROM;Both;10 reps;Seated;Standing Toe Raises: AROM;Both;10 reps;Seated Heel Raises: AROM;Both;10 reps;Seated    General Comments General comments (skin integrity, edema, etc.): pt given HEP      Pertinent Vitals/Pain Pain Assessment: No/denies pain    Home Living                      Prior Function            PT Goals (current goals can now be found in the care plan section) Acute Rehab PT Goals Patient Stated Goal: to get back to her PLOF Progress towards PT goals: Progressing toward goals    Frequency    Min 3X/week      PT Plan Current plan remains appropriate    Co-evaluation             End of Session Equipment Utilized During Treatment: Gait belt Activity Tolerance: Patient tolerated treatment well Patient left: with call bell/phone within reach;in chair;with family/visitor present     Time: 4132-44011152-1230 PT Time Calculation (min) (ACUTE ONLY): 38 min  Charges:  $Gait Training: 23-37 mins $Therapeutic Exercise: 8-22 mins                    G Codes:      Derek MoundKellyn R Shadana Pry Floetta Brickey, PTA Pager: [phone]   07/22/2016,  1:36 PM

## 2016-07-30 DIAGNOSIS — Z941 Heart transplant status: Secondary | ICD-10-CM | POA: Diagnosis not present

## 2016-08-01 DIAGNOSIS — Z941 Heart transplant status: Secondary | ICD-10-CM | POA: Diagnosis not present

## 2016-08-09 DIAGNOSIS — A048 Other specified bacterial intestinal infections: Secondary | ICD-10-CM | POA: Diagnosis not present

## 2016-08-09 DIAGNOSIS — A0472 Enterocolitis due to Clostridium difficile, not specified as recurrent: Secondary | ICD-10-CM | POA: Diagnosis not present

## 2016-08-09 DIAGNOSIS — I10 Essential (primary) hypertension: Secondary | ICD-10-CM | POA: Diagnosis not present

## 2016-08-10 DIAGNOSIS — R197 Diarrhea, unspecified: Secondary | ICD-10-CM | POA: Diagnosis not present

## 2016-08-30 DIAGNOSIS — Z941 Heart transplant status: Secondary | ICD-10-CM | POA: Diagnosis not present

## 2016-08-30 DIAGNOSIS — Z6828 Body mass index (BMI) 28.0-28.9, adult: Secondary | ICD-10-CM | POA: Diagnosis not present

## 2016-08-30 DIAGNOSIS — A048 Other specified bacterial intestinal infections: Secondary | ICD-10-CM | POA: Diagnosis not present

## 2016-09-26 DIAGNOSIS — R5381 Other malaise: Secondary | ICD-10-CM | POA: Diagnosis not present

## 2016-09-26 DIAGNOSIS — R1010 Upper abdominal pain, unspecified: Secondary | ICD-10-CM | POA: Diagnosis not present

## 2016-09-26 DIAGNOSIS — R194 Change in bowel habit: Secondary | ICD-10-CM | POA: Diagnosis not present

## 2016-09-26 DIAGNOSIS — R110 Nausea: Secondary | ICD-10-CM | POA: Diagnosis not present

## 2016-10-02 DIAGNOSIS — Z941 Heart transplant status: Secondary | ICD-10-CM | POA: Diagnosis not present

## 2016-10-11 DIAGNOSIS — L570 Actinic keratosis: Secondary | ICD-10-CM | POA: Diagnosis not present

## 2016-10-11 DIAGNOSIS — L821 Other seborrheic keratosis: Secondary | ICD-10-CM | POA: Diagnosis not present

## 2016-10-11 DIAGNOSIS — D1801 Hemangioma of skin and subcutaneous tissue: Secondary | ICD-10-CM | POA: Diagnosis not present

## 2016-11-09 ENCOUNTER — Other Ambulatory Visit: Payer: Self-pay | Admitting: Gastroenterology

## 2016-11-09 DIAGNOSIS — R194 Change in bowel habit: Secondary | ICD-10-CM | POA: Diagnosis not present

## 2016-11-09 DIAGNOSIS — R110 Nausea: Secondary | ICD-10-CM | POA: Diagnosis not present

## 2016-11-09 DIAGNOSIS — R5381 Other malaise: Secondary | ICD-10-CM | POA: Diagnosis not present

## 2016-11-09 DIAGNOSIS — R1010 Upper abdominal pain, unspecified: Secondary | ICD-10-CM | POA: Diagnosis not present

## 2016-11-09 DIAGNOSIS — R1011 Right upper quadrant pain: Secondary | ICD-10-CM

## 2016-11-10 ENCOUNTER — Ambulatory Visit
Admission: RE | Admit: 2016-11-10 | Discharge: 2016-11-10 | Disposition: A | Discharge status: Home / Self Care | Payer: Medicare Other | Source: Ambulatory Visit | Attending: Gastroenterology | Admitting: Gastroenterology

## 2016-11-10 DIAGNOSIS — R1011 Right upper quadrant pain: Secondary | ICD-10-CM | POA: Diagnosis not present

## 2016-11-15 ENCOUNTER — Ambulatory Visit
Admission: RE | Admit: 2016-11-15 | Discharge: 2016-11-15 | Disposition: A | Discharge status: Home / Self Care | Payer: Medicare Other | Source: Ambulatory Visit | Attending: Gastroenterology | Admitting: Gastroenterology

## 2016-11-15 DIAGNOSIS — R1011 Right upper quadrant pain: Secondary | ICD-10-CM | POA: Diagnosis not present

## 2016-11-15 MED ORDER — IOPAMIDOL (ISOVUE-300) INJECTION 61%
100.0000 mL | Freq: Once | INTRAVENOUS | Status: AC | PRN
  Administered 2016-11-15: 100 mL via INTRAVENOUS

## 2016-11-22 ENCOUNTER — Other Ambulatory Visit: Payer: Self-pay | Admitting: Physician Assistant

## 2016-11-22 DIAGNOSIS — R1310 Dysphagia, unspecified: Secondary | ICD-10-CM | POA: Diagnosis not present

## 2016-11-22 DIAGNOSIS — R110 Nausea: Secondary | ICD-10-CM | POA: Diagnosis not present

## 2016-11-22 DIAGNOSIS — R5381 Other malaise: Secondary | ICD-10-CM | POA: Diagnosis not present

## 2016-11-22 DIAGNOSIS — R194 Change in bowel habit: Secondary | ICD-10-CM | POA: Diagnosis not present

## 2016-11-28 ENCOUNTER — Other Ambulatory Visit: Payer: Medicare Other

## 2016-12-04 DIAGNOSIS — Z79899 Other long term (current) drug therapy: Secondary | ICD-10-CM | POA: Diagnosis not present

## 2016-12-04 DIAGNOSIS — I10 Essential (primary) hypertension: Secondary | ICD-10-CM | POA: Diagnosis not present

## 2016-12-04 DIAGNOSIS — Z86718 Personal history of other venous thrombosis and embolism: Secondary | ICD-10-CM | POA: Diagnosis not present

## 2016-12-04 DIAGNOSIS — Z941 Heart transplant status: Secondary | ICD-10-CM | POA: Diagnosis not present

## 2016-12-11 DIAGNOSIS — Z941 Heart transplant status: Secondary | ICD-10-CM | POA: Diagnosis not present

## 2016-12-20 DIAGNOSIS — J069 Acute upper respiratory infection, unspecified: Secondary | ICD-10-CM | POA: Diagnosis not present

## 2016-12-20 DIAGNOSIS — Z8719 Personal history of other diseases of the digestive system: Secondary | ICD-10-CM | POA: Diagnosis not present

## 2016-12-20 DIAGNOSIS — Z6830 Body mass index (BMI) 30.0-30.9, adult: Secondary | ICD-10-CM | POA: Diagnosis not present

## 2016-12-29 ENCOUNTER — Telehealth (HOSPITAL_COMMUNITY): Payer: Self-pay | Admitting: *Deleted

## 2017-01-07 ENCOUNTER — Ambulatory Visit (HOSPITAL_COMMUNITY)
Admission: EM | Admit: 2017-01-07 | Discharge: 2017-01-07 | Disposition: A | Discharge status: Home / Self Care | Payer: Medicare Other | Attending: Internal Medicine | Admitting: Internal Medicine

## 2017-01-07 ENCOUNTER — Encounter (HOSPITAL_COMMUNITY): Payer: Self-pay | Admitting: Emergency Medicine

## 2017-01-07 DIAGNOSIS — R319 Hematuria, unspecified: Secondary | ICD-10-CM | POA: Diagnosis not present

## 2017-01-07 DIAGNOSIS — N390 Urinary tract infection, site not specified: Secondary | ICD-10-CM | POA: Diagnosis not present

## 2017-01-07 DIAGNOSIS — F419 Anxiety disorder, unspecified: Secondary | ICD-10-CM | POA: Diagnosis not present

## 2017-01-07 DIAGNOSIS — K219 Gastro-esophageal reflux disease without esophagitis: Secondary | ICD-10-CM | POA: Diagnosis not present

## 2017-01-07 DIAGNOSIS — Z87891 Personal history of nicotine dependence: Secondary | ICD-10-CM | POA: Insufficient documentation

## 2017-01-07 DIAGNOSIS — M810 Age-related osteoporosis without current pathological fracture: Secondary | ICD-10-CM | POA: Insufficient documentation

## 2017-01-07 DIAGNOSIS — I10 Essential (primary) hypertension: Secondary | ICD-10-CM | POA: Insufficient documentation

## 2017-01-07 DIAGNOSIS — Z8249 Family history of ischemic heart disease and other diseases of the circulatory system: Secondary | ICD-10-CM | POA: Insufficient documentation

## 2017-01-07 DIAGNOSIS — Z800 Family history of malignant neoplasm of digestive organs: Secondary | ICD-10-CM | POA: Insufficient documentation

## 2017-01-07 DIAGNOSIS — R300 Dysuria: Secondary | ICD-10-CM | POA: Diagnosis not present

## 2017-01-07 DIAGNOSIS — R350 Frequency of micturition: Secondary | ICD-10-CM | POA: Diagnosis present

## 2017-01-07 DIAGNOSIS — Z941 Heart transplant status: Secondary | ICD-10-CM | POA: Insufficient documentation

## 2017-01-07 DIAGNOSIS — F329 Major depressive disorder, single episode, unspecified: Secondary | ICD-10-CM | POA: Diagnosis not present

## 2017-01-07 LAB — POCT URINALYSIS DIP (DEVICE)
Bilirubin Urine: NEGATIVE
Glucose, UA: NEGATIVE mg/dL
Ketones, ur: NEGATIVE mg/dL
Nitrite: NEGATIVE
Protein, ur: 30 mg/dL — AB
Specific Gravity, Urine: 1.015 (ref 1.005–1.030)
Urobilinogen, UA: 0.2 mg/dL (ref 0.0–1.0)
pH: 6 (ref 5.0–8.0)

## 2017-01-07 MED ORDER — CEPHALEXIN 500 MG PO CAPS: 500.0000 mg | ORAL_CAPSULE | Freq: Four times a day (QID) | ORAL | 0 refills | Status: DC

## 2017-01-07 MED ORDER — PHENAZOPYRIDINE HCL 200 MG PO TABS: 200.0000 mg | ORAL_TABLET | Freq: Three times a day (TID) | ORAL | 0 refills | Status: DC

## 2017-01-07 NOTE — ED Triage Notes (Signed)
Reports having an uri recently and cough and noticing urine leaks.   Reports this feeling is urgent without any coughing.  Mild burning this morning.  Patient is tired.

## 2017-01-07 NOTE — ED Provider Notes (Signed)
CSN: [id number]     Arrival date & time 01/07/17  1623 History   First MD Initiated Contact with Patient 01/07/17 1651     Chief Complaint  Patient presents with  . Urinary Frequency   (Consider location/radiation/quality/duration/timing/severity/associated sxs/prior Treatment) Patient c/o urine incontinence with cough and she is having some dysuria.  C/o fatigue.   The history is provided by the patient.  Urinary Frequency  This is a new problem. The problem occurs constantly. The problem has not changed since onset.Nothing aggravates the symptoms.    Past Medical History:  Diagnosis Date  . A-fib (HCC)    resolved  . Anxiety   . CHF (congestive heart failure) (HCC)    resolved  . Depression   . DVT (deep venous thrombosis) (HCC)   . GERD (gastroesophageal reflux disease)   . Heart transplanted [facility])    Followed at [facility] - Dr. Laurence Compton  . HTN (hypertension)   . Hyperparathyroidism (HCC)    resolved  . Osteoporosis   . Pacemaker    resolved   Past Surgical History:  Procedure Laterality Date  . ABDOMINAL HYSTERECTOMY    . ABLATION  2007  . ABLATION ON ENDOMETRIOSIS    . APPENDECTOMY    . CARDIAC CATHETERIZATION  2009  . HEART TRANSPLANT  2009  . HEART TRANSPLANT    . NECK SURGERY     removal of parathyroid  . PACEMAKER INSERTION  2005  . TOTAL ABDOMINAL HYSTERECTOMY W/ BILATERAL SALPINGOOPHORECTOMY     Family History  Problem Relation Age of Onset  . Heart attack Mother   . Colon cancer Father   . Heart attack Maternal Grandmother   . Heart attack Paternal Grandmother   . Diabetes type II Paternal Grandmother   . OCD Daughter    Social History  Substance Use Topics  . Smoking status: Former Games developer  . Smokeless tobacco: Never Used     Comment: Quit 1960  . Alcohol use No   OB History    No data available     Review of Systems  Constitutional: Positive for fatigue.  HENT: Negative.   Eyes: Negative.   Respiratory: Negative.    Cardiovascular: Negative.   Gastrointestinal: Negative.   Endocrine: Negative.   Genitourinary: Positive for frequency.  Musculoskeletal: Negative.   Allergic/Immunologic: Negative.   Neurological: Negative.   Hematological: Negative.   Psychiatric/Behavioral: Negative.     Allergies  Antihistamines, chlorpheniramine-type and Codeine  Home Medications   Prior to Admission medications   Medication Sig Start Date End Date Taking? Authorizing Provider  aspirin 81 MG tablet Take 81 mg by mouth daily.    [provider]  cephALEXin (KEFLEX) 500 MG capsule Take 1 capsule (500 mg total) by mouth 4 (four) times daily. 01/07/17   Deatra Canter, FNP  clonazePAM (KLONOPIN) 1 MG tablet Take 1 mg by mouth at bedtime.     [provider]  conjugated estrogens (PREMARIN) vaginal cream Place 1 Applicatorful vaginally 2 (two) times a week.     [provider]  cyanocobalamin 500 MCG tablet Take 500 mcg by mouth daily.    [provider]  diltiazem (CARDIZEM CD) 120 MG 24 hr capsule Take 120 mg by mouth 2 (two) times daily. 07/06/16   [provider]  escitalopram (LEXAPRO) 20 MG tablet Take 20 mg by mouth daily.    [provider]  iron polysaccharides (NIFEREX) 150 MG capsule Take 150 mg by mouth daily.    [provider]  levothyroxine (SYNTHROID, LEVOTHROID) 50 MCG tablet Take 50 mcg by mouth daily before breakfast.    [provider]  loperamide (IMODIUM) 2 MG capsule Take 2 mg by mouth as needed for diarrhea or loose stools.     [provider]  losartan (COZAAR) 50 MG tablet Take 50 mg by mouth 2 (two) times daily.    [provider]  magnesium oxide (MAG-OX) 400 MG tablet Take 400 mg by mouth daily.    [provider]  Multiple Vitamins-Minerals (MULTIVITAMIN PO) Take 1 tablet by mouth daily.    [provider]  mycophenolate (CELLCEPT) 250 MG capsule Take 250 mg by mouth 2 (two) times  daily.     [provider]  phenazopyridine (PYRIDIUM) 200 MG tablet Take 1 tablet (200 mg total) by mouth 3 (three) times daily. 01/07/17   Deatra Canterxford, William J, FNP  tacrolimus (PROGRAF) 0.5 MG capsule Take 0.5 mg by mouth 2 (two) times daily.    [provider]  vancomycin (VANCOCIN) 50 mg/mL oral solution Take 2.5 mLs (125 mg total) by mouth every 6 (six) hours. For 11 days 07/22/16   Leatha GildingGherghe, Costin M, MD  Vitamin D, Ergocalciferol, (DRISDOL) 50000 UNITS CAPS capsule Take 50,000 Units by mouth every Friday.     [provider]   Meds Ordered and Administered this Visit  Medications - No data to display  BP 121/68 (BP Location: Right Arm)   Pulse 60   Temp 98.6 F (37 C) (Oral)   Resp 18   SpO2 97%  No data found.   Physical Exam  Constitutional: She appears well-developed and well-nourished.  HENT:  Head: Normocephalic and atraumatic.  Eyes: Conjunctivae and EOM are normal. Pupils are equal, round, and reactive to light.  Neck: Normal range of motion. Neck supple.  Cardiovascular: Normal rate, regular rhythm and normal heart sounds.   Pulmonary/Chest: Effort normal and breath sounds normal.  Abdominal: Soft. Bowel sounds are normal.  Nursing note and vitals reviewed.   Urgent Care Course     Procedures (including critical care time)  Labs Review Labs Reviewed  POCT URINALYSIS DIP (DEVICE) - Abnormal; Notable for the following:       Result Value   Hgb urine dipstick MODERATE (*)    Protein, ur 30 (*)    Leukocytes, UA SMALL (*)    All other components within normal limits  URINE CULTURE    Imaging Review No results found.   Visual Acuity Review  Right Eye Distance:   Left Eye Distance:   Bilateral Distance:    Right Eye Near:   Left Eye Near:    Bilateral Near:         MDM   1. Urinary tract infection with hematuria, site unspecified    Keflex 500mg  one po qid x 7 days #28 Pyridium 200mg  one po tid x 2 days #6  UA  cx  Push po fluids, rest, tylenol and motrin otc prn as directed for fever, arthralgias, and myalgias.  Follow up prn if sx's continue or persist.    Deatra CanterOxford, William J, FNP 01/07/17 1712

## 2017-01-08 LAB — URINE CULTURE

## 2017-01-15 DIAGNOSIS — Z941 Heart transplant status: Secondary | ICD-10-CM | POA: Diagnosis not present

## 2017-01-15 DIAGNOSIS — E890 Postprocedural hypothyroidism: Secondary | ICD-10-CM | POA: Diagnosis not present

## 2017-01-15 DIAGNOSIS — Z79899 Other long term (current) drug therapy: Secondary | ICD-10-CM | POA: Diagnosis not present

## 2017-01-15 DIAGNOSIS — E213 Hyperparathyroidism, unspecified: Secondary | ICD-10-CM | POA: Diagnosis not present

## 2017-01-16 DIAGNOSIS — Z941 Heart transplant status: Secondary | ICD-10-CM | POA: Diagnosis not present

## 2017-01-17 ENCOUNTER — Encounter (HOSPITAL_COMMUNITY)
Admission: RE | Admit: 2017-01-17 | Discharge: 2017-01-17 | Disposition: A | Discharge status: Home / Self Care | Payer: Self-pay | Source: Ambulatory Visit | Attending: Cardiology | Admitting: Cardiology

## 2017-01-17 DIAGNOSIS — Z941 Heart transplant status: Secondary | ICD-10-CM | POA: Insufficient documentation

## 2017-01-19 ENCOUNTER — Encounter (HOSPITAL_COMMUNITY): Payer: Self-pay

## 2017-01-24 ENCOUNTER — Encounter (HOSPITAL_COMMUNITY)
Admission: RE | Admit: 2017-01-24 | Discharge: 2017-01-24 | Disposition: A | Discharge status: Home / Self Care | Payer: Self-pay | Source: Ambulatory Visit | Attending: Cardiology | Admitting: Cardiology

## 2017-01-26 ENCOUNTER — Encounter (HOSPITAL_COMMUNITY)
Admission: RE | Admit: 2017-01-26 | Discharge: 2017-01-26 | Disposition: A | Discharge status: Home / Self Care | Payer: Medicare Other | Source: Ambulatory Visit | Attending: Cardiology | Admitting: Cardiology

## 2017-01-26 DIAGNOSIS — Z941 Heart transplant status: Secondary | ICD-10-CM | POA: Insufficient documentation

## 2017-01-29 ENCOUNTER — Encounter (HOSPITAL_COMMUNITY): Payer: Self-pay

## 2017-01-30 ENCOUNTER — Emergency Department (HOSPITAL_COMMUNITY): Payer: Medicare Other

## 2017-01-30 ENCOUNTER — Encounter (HOSPITAL_BASED_OUTPATIENT_CLINIC_OR_DEPARTMENT_OTHER): Payer: Self-pay | Admitting: *Deleted

## 2017-01-30 ENCOUNTER — Emergency Department (HOSPITAL_BASED_OUTPATIENT_CLINIC_OR_DEPARTMENT_OTHER)
Admission: EM | Admit: 2017-01-30 | Discharge: 2017-01-30 | Disposition: A | Discharge status: Home / Self Care | Payer: Medicare Other | Attending: Emergency Medicine | Admitting: Emergency Medicine

## 2017-01-30 DIAGNOSIS — Z941 Heart transplant status: Secondary | ICD-10-CM | POA: Insufficient documentation

## 2017-01-30 DIAGNOSIS — R1032 Left lower quadrant pain: Secondary | ICD-10-CM | POA: Diagnosis present

## 2017-01-30 DIAGNOSIS — Z79899 Other long term (current) drug therapy: Secondary | ICD-10-CM | POA: Insufficient documentation

## 2017-01-30 DIAGNOSIS — K529 Noninfective gastroenteritis and colitis, unspecified: Secondary | ICD-10-CM

## 2017-01-30 DIAGNOSIS — Z87891 Personal history of nicotine dependence: Secondary | ICD-10-CM | POA: Diagnosis not present

## 2017-01-30 DIAGNOSIS — R109 Unspecified abdominal pain: Secondary | ICD-10-CM | POA: Diagnosis not present

## 2017-01-30 DIAGNOSIS — R197 Diarrhea, unspecified: Secondary | ICD-10-CM

## 2017-01-30 DIAGNOSIS — I10 Essential (primary) hypertension: Secondary | ICD-10-CM | POA: Diagnosis not present

## 2017-01-30 LAB — COMPREHENSIVE METABOLIC PANEL
ALT: 13 U/L — ABNORMAL LOW (ref 14–54)
AST: 21 U/L (ref 15–41)
Albumin: 3.5 g/dL (ref 3.5–5.0)
Alkaline Phosphatase: 80 U/L (ref 38–126)
Anion gap: 8 (ref 5–15)
BUN: 12 mg/dL (ref 6–20)
CO2: 24 mmol/L (ref 22–32)
Calcium: 9 mg/dL (ref 8.9–10.3)
Chloride: 105 mmol/L (ref 101–111)
Creatinine, Ser: 0.83 mg/dL (ref 0.44–1.00)
GFR calc Af Amer: >60 mL/min (ref >60)
GFR calc non Af Amer: >60 mL/min (ref >60)
Glucose, Bld: 86 mg/dL (ref 65–99)
Potassium: 3.8 mmol/L (ref 3.5–5.1)
Sodium: 137 mmol/L (ref 135–145)
Total Bilirubin: 0.5 mg/dL (ref 0.3–1.2)
Total Protein: 6.8 g/dL (ref 6.5–8.1)

## 2017-01-30 LAB — URINALYSIS, ROUTINE W REFLEX MICROSCOPIC
Bilirubin Urine: NEGATIVE
Glucose, UA: NEGATIVE mg/dL
Hgb urine dipstick: NEGATIVE
Ketones, ur: NEGATIVE mg/dL
Leukocytes, UA: NEGATIVE
Nitrite: NEGATIVE
Protein, ur: NEGATIVE mg/dL
Specific Gravity, Urine: 1.011 (ref 1.005–1.030)
pH: 6.5 (ref 5.0–8.0)

## 2017-01-30 LAB — CBC WITH DIFFERENTIAL/PLATELET
Basophils Absolute: 0 10*3/uL (ref 0.0–0.1)
Basophils Relative: 0 %
Eosinophils Absolute: 0.2 10*3/uL (ref 0.0–0.7)
Eosinophils Relative: 2 %
HCT: 39.1 % (ref 36.0–46.0)
Hemoglobin: 12.8 g/dL (ref 12.0–15.0)
Lymphocytes Relative: 23 %
Lymphs Abs: 2.4 10*3/uL (ref 0.7–4.0)
MCH: 27.2 pg (ref 26.0–34.0)
MCHC: 32.7 g/dL (ref 30.0–36.0)
MCV: 83 fL (ref 78.0–100.0)
Monocytes Absolute: 1.3 10*3/uL — ABNORMAL HIGH (ref 0.1–1.0)
Monocytes Relative: 12 %
Neutro Abs: 6.4 10*3/uL (ref 1.7–7.7)
Neutrophils Relative %: 63 %
Platelets: 327 10*3/uL (ref 150–400)
RBC: 4.71 MIL/uL (ref 3.87–5.11)
RDW: 15.3 % (ref 11.5–15.5)
WBC: 10.2 10*3/uL (ref 4.0–10.5)

## 2017-01-30 LAB — C DIFFICILE QUICK SCREEN W PCR REFLEX
C Diff antigen: NEGATIVE
C Diff interpretation: NOT DETECTED
C Diff toxin: NEGATIVE

## 2017-01-30 LAB — LIPASE, BLOOD: Lipase: 20 U/L (ref 11–51)

## 2017-01-30 MED ORDER — SODIUM CHLORIDE 0.9 % IV BOLUS (SEPSIS)
1000.0000 mL | Freq: Once | INTRAVENOUS | Status: AC
  Administered 2017-01-30: 1000 mL via INTRAVENOUS

## 2017-01-30 MED ORDER — CIPROFLOXACIN HCL 500 MG PO TABS: 500.0000 mg | ORAL_TABLET | Freq: Two times a day (BID) | ORAL | 0 refills | Status: DC

## 2017-01-30 MED ORDER — METRONIDAZOLE 500 MG PO TABS
500.0000 mg | ORAL_TABLET | Freq: Once | ORAL | Status: AC
  Administered 2017-01-30: 500 mg via ORAL
  Filled 2017-01-30: qty 1

## 2017-01-30 MED ORDER — IOPAMIDOL (ISOVUE-300) INJECTION 61%
INTRAVENOUS | Status: AC
  Administered 2017-01-30: 100 mL
  Filled 2017-01-30: qty 100

## 2017-01-30 MED ORDER — CIPROFLOXACIN HCL 500 MG PO TABS
500.0000 mg | ORAL_TABLET | Freq: Once | ORAL | Status: AC
Start: 2017-01-30 — End: 2017-01-30
  Administered 2017-01-30: 500 mg via ORAL
  Filled 2017-01-30: qty 1

## 2017-01-30 MED ORDER — FENTANYL CITRATE (PF) 100 MCG/2ML IJ SOLN
50.0000 ug | Freq: Once | INTRAMUSCULAR | Status: AC
  Administered 2017-01-30: 50 ug via INTRAVENOUS
  Filled 2017-01-30: qty 2

## 2017-01-30 MED ORDER — METRONIDAZOLE 500 MG PO TABS: 500.0000 mg | ORAL_TABLET | Freq: Two times a day (BID) | ORAL | 0 refills | Status: DC

## 2017-01-30 NOTE — ED Provider Notes (Signed)
Patient presents via care Link from [facility] for [facility] abdomen for evaluation of possible diverticulitis due to unavailability of imaging. Plan to send home if CT negative and treat for C. difficile as appropriate. Heart transplant patient on immunosuppressant who has been experiencing 4 days of diarrhea and lower abdominal pain. She also has a history of C. difficile and hasn't had any diarrhea while in ED. CT was ordered. Patient's pain was managed at this time she had no complaints. We'll monitor and reassess. On reassessment, patient was eating a [place] sandwich and stated that the pain was starting to come back. Pain was managed. Attempt to obtain stool sample successful and C. difficile PCR pending. CT abdomen was negative.  Transferred patient care at end of shift to Earley FavorGail Schulz pending C. difficile PCR. Anticipate discharge home with appropriate treatment. Patient was well-appearing and had no complaint at the time of transfer. Afebrile nontoxic and stable.   Georgiana ShoreMitchell, Kamali Sakata B, PA-C 01/30/17 2038    Margarita Grizzleay, Danielle, MD 02/04/17 [phone]

## 2017-01-30 NOTE — ED Notes (Signed)
Pt ambulatory to the restroom.  

## 2017-01-30 NOTE — ED Provider Notes (Signed)
[facility] Provider Note   CSN: [id number] Arrival date & time: 01/30/17  1241     History   Chief Complaint Chief Complaint  Patient presents with  . Abdominal Pain    HPI [name] [name] is a 76 y.o. female.  The history is provided by the patient.  Abdominal Pain   This is a new problem. Episode onset: 4 days. The problem occurs constantly. Progression since onset: fluctuating. The pain is associated with an unknown factor. The pain is located in the suprapubic region, LLQ and RLQ. The quality of the pain is sharp. The pain is moderate. Associated symptoms include diarrhea. Pertinent negatives include anorexia, fever, hematochezia, melena, nausea, vomiting, constipation and dysuria. Exacerbated by: BM. Nothing relieves the symptoms. Past medical history comments: diverticulosis; C. Diff (Nov 2017); H/o heart transplant on immunosupression.   Reports being treated 3 weeks ago with keflex for UTI.    Past Medical History:  Diagnosis Date  . A-fib (HCC)    resolved  . Anxiety   . CHF (congestive heart failure) (HCC)    resolved  . Depression   . DVT (deep venous thrombosis) (HCC)   . GERD (gastroesophageal reflux disease)   . Heart transplanted [facility](HCC)    [facility] - Dr. Laurence ComptonBarber Pisani  . HTN (hypertension)   . Hyperparathyroidism (HCC)    resolved  . Osteoporosis   . Pacemaker    resolved    Patient Active Problem List   Diagnosis Date Noted  . Colitis 07/18/2016  . Depression   . Heart transplanted (HCC)   . HTN (hypertension)     Past Surgical History:  Procedure Laterality Date  . ABDOMINAL HYSTERECTOMY    . ABLATION  2007  . ABLATION ON ENDOMETRIOSIS    . APPENDECTOMY    . CARDIAC CATHETERIZATION  2009  . HEART TRANSPLANT  2009  . HEART TRANSPLANT    . NECK SURGERY     removal of parathyroid  . PACEMAKER INSERTION  2005  . TOTAL ABDOMINAL HYSTERECTOMY W/ BILATERAL SALPINGOOPHORECTOMY      OB History    No data available         Home Medications    Prior to Admission medications   Medication Sig Start Date End Date Taking? Authorizing Provider  aspirin 81 MG tablet Take 81 mg by mouth daily.    [provider]  cephALEXin (KEFLEX) 500 MG capsule Take 1 capsule (500 mg total) by mouth 4 (four) times daily. Patient not taking: Reported on 01/17/2017 01/07/17   Deatra Canterxford, William J, FNP  clonazePAM (KLONOPIN) 1 MG tablet Take 1 mg by mouth at bedtime.     [provider]  conjugated estrogens (PREMARIN) vaginal cream Place 1 Applicatorful vaginally 2 (two) times a week.     [provider]  cyanocobalamin 500 MCG tablet Take 500 mcg by mouth daily.    [provider]  diltiazem (CARDIZEM CD) 120 MG 24 hr capsule Take 120 mg by mouth 2 (two) times daily. 07/06/16   [provider]  escitalopram (LEXAPRO) 20 MG tablet Take 20 mg by mouth daily.    [provider]  iron polysaccharides (NIFEREX) 150 MG capsule Take 150 mg by mouth daily.    [provider]  levothyroxine (SYNTHROID, LEVOTHROID) 50 MCG tablet Take 50 mcg by mouth daily before breakfast.    [provider]  loperamide (IMODIUM) 2 MG capsule Take 2 mg by mouth as needed for diarrhea or loose  stools.     [provider]  losartan (COZAAR) 50 MG tablet Take 50 mg by mouth 2 (two) times daily.    [provider]  magnesium oxide (MAG-OX) 400 MG tablet Take 400 mg by mouth daily.    [provider]  Multiple Vitamins-Minerals (MULTIVITAMIN PO) Take 1 tablet by mouth daily.    [provider]  mycophenolate (CELLCEPT) 250 MG capsule Take 250 mg by mouth 2 (two) times daily.     [provider]  phenazopyridine (PYRIDIUM) 200 MG tablet Take 1 tablet (200 mg total) by mouth 3 (three) times daily. 01/07/17   Deatra Canter, FNP  tacrolimus (PROGRAF) 0.5 MG capsule Take 0.5 mg by mouth 2 (two) times daily.    [provider]  vancomycin  (VANCOCIN) 50 mg/mL oral solution Take 2.5 mLs (125 mg total) by mouth every 6 (six) hours. For 11 days 07/22/16   Leatha Gilding, MD  Vitamin D, Ergocalciferol, (DRISDOL) 50000 UNITS CAPS capsule Take 50,000 Units by mouth every Friday.     [provider]    Family History Family History  Problem Relation Age of Onset  . Heart attack Mother   . Colon cancer Father   . Heart attack Maternal Grandmother   . Heart attack Paternal Grandmother   . Diabetes type II Paternal Grandmother   . OCD Daughter     Social History Social History  Substance Use Topics  . Smoking status: Former Games developer  . Smokeless tobacco: Never Used     Comment: Quit 1960  . Alcohol use No     Allergies   Antihistamines, chlorpheniramine-type and Codeine   Review of Systems Review of Systems  Constitutional: Negative for fever.  Gastrointestinal: Positive for abdominal pain and diarrhea. Negative for anorexia, constipation, hematochezia, melena, nausea and vomiting.  Genitourinary: Negative for dysuria.  All other systems are reviewed and are negative for acute change except as noted in the HPI    Physical Exam Updated Vital Signs BP 133/88   Pulse 73   Temp 98.3 F (36.8 C) (Oral)   Resp 16   Ht 5\' 3"  (1.6 m)   Wt 78.5 kg (173 lb)   SpO2 98%   BMI 30.65 kg/m   Physical Exam  Constitutional: She is oriented to person, place, and time. She appears well-developed and well-nourished. No distress.  HENT:  Head: Normocephalic and atraumatic.  Right Ear: External ear normal.  Left Ear: External ear normal.  Nose: Nose normal.  Eyes: Conjunctivae and EOM are normal. No scleral icterus.  Neck: Normal range of motion and phonation normal.  Cardiovascular: Normal rate and regular rhythm.   Pulmonary/Chest: Effort normal. No stridor. No respiratory distress.  Abdominal: She exhibits no distension. There is tenderness in the right lower quadrant, suprapubic area and left lower quadrant.  There is no rigidity, no rebound and no guarding.  Musculoskeletal: Normal range of motion. She exhibits no edema.  Neurological: She is alert and oriented to person, place, and time.  Skin: She is not diaphoretic.  Psychiatric: She has a normal mood and affect. Her behavior is normal.  Vitals reviewed.    ED Treatments / Results  Labs (all labs ordered are listed, but only abnormal results are displayed) Labs Reviewed  CBC WITH DIFFERENTIAL/PLATELET - Abnormal; Notable for the following:       Result Value   Monocytes Absolute 1.3 (*)    All other components within normal limits  COMPREHENSIVE METABOLIC PANEL -  Abnormal; Notable for the following:    ALT 13 (*)    All other components within normal limits  C DIFFICILE QUICK SCREEN W PCR REFLEX  LIPASE, BLOOD  URINALYSIS, ROUTINE W REFLEX MICROSCOPIC    EKG  EKG Interpretation None       Radiology No results found.  Procedures Procedures (including critical care time)  Medications Ordered in ED Medications  sodium chloride 0.9 % bolus 1,000 mL (1,000 mLs Intravenous New Bag/Given 01/30/17 1325)     Initial Impression / Assessment and Plan / ED Course  I have reviewed the triage vital signs and the nursing notes.  Pertinent labs & imaging results that were available during my care of the patient were reviewed by me and considered in my medical decision making (see chart for details).     The patient is afebrile with stable vital signs, well-appearing, well-hydrated. Lower abdominal tenderness on exam. Not peritonitic.  Presentation concerning for diverticulitis versus C. difficile flare. We'll obtain screening labs here and attempt to get a stool sample. I would not treat this patient empirically for diverticulitis given her history of C. Difficile, without first verifying with the CAT scan. Unfortunately we do not have CT scanner at this facility and the patient will need to be transferred to another facility. She  chose to go to [facility].  Discussed case with Dr. Rosalia Hammers at [facility] who will be anticipating her arrival.     Nira Conn, MD 01/30/17 [phone]

## 2017-01-30 NOTE — ED Notes (Signed)
ED Provider at bedside. 

## 2017-01-30 NOTE — ED Notes (Signed)
Called lab to see how long it takes to get results for c diff and lab stated about 2 more hours. Pt and family updated

## 2017-01-30 NOTE — Discharge Instructions (Signed)
Your Ct Scan show colitis which is a general swelling/irritation of the large intestines  You have been started on Cipro and Flagyl for 1 week Please call your Cardiologist in the morning

## 2017-01-30 NOTE — ED Notes (Signed)
Pt returned from CT °

## 2017-01-30 NOTE — ED Provider Notes (Signed)
Stool sample return negative for C Diff but due to CT Reading and immunosuppression will start Cipro/ Flagyl for 7 days Patient to call her Cardiologist in the morning    Earley FavorSchulz, Emillee Talsma, NP 01/30/17 2227    Benjiman CorePickering, Nathan, MD 01/30/17 2241

## 2017-01-30 NOTE — ED Notes (Signed)
Pt provided with turkey sandwich and water

## 2017-01-30 NOTE — ED Notes (Signed)
Patient transported to CT 

## 2017-01-30 NOTE — ED Triage Notes (Signed)
Lower abdominal pain x 4 days. Diarrhea since yesterday.

## 2017-01-30 NOTE — ED Notes (Signed)
[facility] reports giving total 0.5 mcg fentanyl for 8/10 pain.

## 2017-01-31 ENCOUNTER — Encounter (HOSPITAL_COMMUNITY): Payer: Self-pay

## 2017-02-01 DIAGNOSIS — R102 Pelvic and perineal pain: Secondary | ICD-10-CM | POA: Diagnosis not present

## 2017-02-01 DIAGNOSIS — K529 Noninfective gastroenteritis and colitis, unspecified: Secondary | ICD-10-CM | POA: Diagnosis not present

## 2017-02-02 ENCOUNTER — Encounter (HOSPITAL_COMMUNITY): Payer: Self-pay

## 2017-02-02 DIAGNOSIS — R933 Abnormal findings on diagnostic imaging of other parts of digestive tract: Secondary | ICD-10-CM | POA: Diagnosis not present

## 2017-02-02 DIAGNOSIS — R197 Diarrhea, unspecified: Secondary | ICD-10-CM | POA: Diagnosis not present

## 2017-02-05 ENCOUNTER — Encounter (HOSPITAL_COMMUNITY): Payer: Self-pay

## 2017-02-06 DIAGNOSIS — R197 Diarrhea, unspecified: Secondary | ICD-10-CM | POA: Diagnosis not present

## 2017-02-06 DIAGNOSIS — R933 Abnormal findings on diagnostic imaging of other parts of digestive tract: Secondary | ICD-10-CM | POA: Diagnosis not present

## 2017-02-06 DIAGNOSIS — R102 Pelvic and perineal pain: Secondary | ICD-10-CM | POA: Diagnosis not present

## 2017-02-06 DIAGNOSIS — R109 Unspecified abdominal pain: Secondary | ICD-10-CM | POA: Diagnosis not present

## 2017-02-07 ENCOUNTER — Encounter (HOSPITAL_COMMUNITY): Payer: Self-pay

## 2017-02-09 ENCOUNTER — Encounter (HOSPITAL_COMMUNITY): Payer: Self-pay

## 2017-02-12 ENCOUNTER — Encounter (HOSPITAL_COMMUNITY): Payer: Self-pay

## 2017-02-14 ENCOUNTER — Encounter (HOSPITAL_COMMUNITY)
Admission: RE | Admit: 2017-02-14 | Discharge: 2017-02-14 | Disposition: A | Discharge status: Home / Self Care | Payer: Self-pay | Source: Ambulatory Visit | Attending: Cardiology | Admitting: Cardiology

## 2017-02-16 ENCOUNTER — Encounter (HOSPITAL_COMMUNITY): Payer: Self-pay

## 2017-02-17 DIAGNOSIS — Z941 Heart transplant status: Secondary | ICD-10-CM | POA: Diagnosis not present

## 2017-02-19 ENCOUNTER — Encounter (HOSPITAL_COMMUNITY): Payer: Self-pay

## 2017-02-21 ENCOUNTER — Encounter (HOSPITAL_COMMUNITY): Payer: Self-pay

## 2017-02-23 ENCOUNTER — Encounter (HOSPITAL_COMMUNITY): Payer: Self-pay

## 2017-02-26 ENCOUNTER — Encounter (HOSPITAL_COMMUNITY): Payer: Self-pay | Attending: Cardiology

## 2017-02-26 DIAGNOSIS — Z941 Heart transplant status: Secondary | ICD-10-CM | POA: Insufficient documentation

## 2017-03-02 ENCOUNTER — Encounter (HOSPITAL_COMMUNITY): Payer: Self-pay

## 2017-03-05 ENCOUNTER — Encounter (HOSPITAL_COMMUNITY): Payer: Self-pay

## 2017-03-07 ENCOUNTER — Encounter (HOSPITAL_COMMUNITY): Payer: Self-pay

## 2017-03-09 ENCOUNTER — Encounter (HOSPITAL_COMMUNITY): Payer: Self-pay

## 2017-03-12 ENCOUNTER — Encounter (HOSPITAL_COMMUNITY): Payer: Self-pay

## 2017-03-14 ENCOUNTER — Encounter (HOSPITAL_COMMUNITY): Payer: Self-pay

## 2017-03-14 DIAGNOSIS — Z941 Heart transplant status: Secondary | ICD-10-CM | POA: Diagnosis not present

## 2017-03-16 ENCOUNTER — Encounter (HOSPITAL_COMMUNITY): Payer: Self-pay

## 2017-03-19 ENCOUNTER — Encounter (HOSPITAL_COMMUNITY): Payer: Self-pay

## 2017-03-21 ENCOUNTER — Encounter (HOSPITAL_COMMUNITY): Payer: Self-pay

## 2017-03-22 DIAGNOSIS — Z941 Heart transplant status: Secondary | ICD-10-CM | POA: Diagnosis not present

## 2017-03-23 ENCOUNTER — Encounter (HOSPITAL_COMMUNITY): Payer: Self-pay

## 2017-03-26 ENCOUNTER — Encounter (HOSPITAL_COMMUNITY): Payer: Self-pay

## 2017-03-28 ENCOUNTER — Encounter (HOSPITAL_COMMUNITY): Payer: Medicare Other

## 2017-03-30 ENCOUNTER — Encounter (HOSPITAL_COMMUNITY): Payer: Medicare Other

## 2017-04-02 ENCOUNTER — Encounter (HOSPITAL_COMMUNITY): Payer: Medicare Other

## 2017-04-04 ENCOUNTER — Encounter (HOSPITAL_COMMUNITY): Payer: Medicare Other

## 2017-04-06 ENCOUNTER — Encounter (HOSPITAL_COMMUNITY): Payer: Medicare Other

## 2017-04-09 ENCOUNTER — Encounter (HOSPITAL_COMMUNITY): Payer: Medicare Other

## 2017-04-11 ENCOUNTER — Encounter (HOSPITAL_COMMUNITY): Payer: Medicare Other

## 2017-04-13 ENCOUNTER — Encounter (HOSPITAL_COMMUNITY): Payer: Medicare Other

## 2017-04-16 ENCOUNTER — Encounter (HOSPITAL_COMMUNITY): Payer: Medicare Other

## 2017-04-17 ENCOUNTER — Ambulatory Visit (HOSPITAL_COMMUNITY)
Admission: RE | Admit: 2017-04-17 | Discharge: 2017-04-17 | Disposition: A | Discharge status: Home / Self Care | Payer: Medicare Other | Source: Ambulatory Visit | Attending: Vascular Surgery | Admitting: Vascular Surgery

## 2017-04-17 ENCOUNTER — Other Ambulatory Visit (HOSPITAL_COMMUNITY): Payer: Self-pay | Admitting: Internal Medicine

## 2017-04-17 DIAGNOSIS — Z86718 Personal history of other venous thrombosis and embolism: Secondary | ICD-10-CM | POA: Insufficient documentation

## 2017-04-18 ENCOUNTER — Encounter (HOSPITAL_COMMUNITY): Payer: Medicare Other

## 2017-04-20 ENCOUNTER — Encounter (HOSPITAL_COMMUNITY): Payer: Medicare Other

## 2017-04-21 DIAGNOSIS — Z941 Heart transplant status: Secondary | ICD-10-CM | POA: Diagnosis not present

## 2017-04-23 ENCOUNTER — Encounter (HOSPITAL_COMMUNITY): Payer: Medicare Other

## 2017-04-25 ENCOUNTER — Encounter (HOSPITAL_COMMUNITY): Payer: Medicare Other

## 2017-04-27 ENCOUNTER — Encounter (HOSPITAL_COMMUNITY): Payer: Medicare Other

## 2017-05-02 ENCOUNTER — Encounter (HOSPITAL_COMMUNITY): Payer: Medicare Other

## 2017-05-04 ENCOUNTER — Encounter (HOSPITAL_COMMUNITY): Payer: Medicare Other

## 2017-05-07 ENCOUNTER — Encounter (HOSPITAL_COMMUNITY): Payer: Medicare Other

## 2017-05-09 ENCOUNTER — Encounter (HOSPITAL_COMMUNITY): Payer: Medicare Other

## 2017-05-11 ENCOUNTER — Encounter (HOSPITAL_COMMUNITY): Payer: Medicare Other

## 2017-05-14 ENCOUNTER — Encounter (HOSPITAL_COMMUNITY): Payer: Medicare Other

## 2017-05-16 ENCOUNTER — Encounter (HOSPITAL_COMMUNITY): Payer: Medicare Other

## 2017-05-18 ENCOUNTER — Encounter (HOSPITAL_COMMUNITY): Payer: Medicare Other

## 2017-05-21 ENCOUNTER — Encounter (HOSPITAL_COMMUNITY): Payer: Medicare Other

## 2017-05-23 ENCOUNTER — Encounter (HOSPITAL_COMMUNITY): Payer: Medicare Other

## 2017-05-24 DIAGNOSIS — Z941 Heart transplant status: Secondary | ICD-10-CM | POA: Diagnosis not present

## 2017-05-25 ENCOUNTER — Encounter (HOSPITAL_COMMUNITY): Payer: Medicare Other

## 2017-05-28 ENCOUNTER — Encounter (HOSPITAL_COMMUNITY): Payer: Medicare Other

## 2017-05-30 ENCOUNTER — Encounter (HOSPITAL_COMMUNITY): Payer: Medicare Other

## 2017-06-01 ENCOUNTER — Encounter (HOSPITAL_COMMUNITY): Payer: Medicare Other

## 2017-06-04 ENCOUNTER — Encounter (HOSPITAL_COMMUNITY): Payer: Medicare Other

## 2017-06-06 ENCOUNTER — Encounter (HOSPITAL_COMMUNITY): Payer: Medicare Other

## 2017-06-08 ENCOUNTER — Encounter (HOSPITAL_COMMUNITY): Payer: Medicare Other

## 2017-06-09 DIAGNOSIS — Z23 Encounter for immunization: Secondary | ICD-10-CM | POA: Diagnosis not present

## 2017-06-11 ENCOUNTER — Encounter (HOSPITAL_COMMUNITY): Payer: Medicare Other

## 2017-06-13 ENCOUNTER — Encounter (HOSPITAL_COMMUNITY): Payer: Medicare Other

## 2017-06-15 ENCOUNTER — Encounter (HOSPITAL_COMMUNITY): Payer: Medicare Other

## 2017-06-18 ENCOUNTER — Encounter (HOSPITAL_COMMUNITY): Payer: Medicare Other

## 2017-06-20 ENCOUNTER — Encounter (HOSPITAL_COMMUNITY): Payer: Medicare Other

## 2017-06-22 ENCOUNTER — Other Ambulatory Visit (HOSPITAL_COMMUNITY): Payer: Self-pay | Admitting: Internal Medicine

## 2017-06-22 ENCOUNTER — Ambulatory Visit (HOSPITAL_COMMUNITY)
Admission: RE | Admit: 2017-06-22 | Discharge: 2017-06-22 | Disposition: A | Discharge status: Home / Self Care | Payer: Medicare Other | Source: Ambulatory Visit | Attending: Internal Medicine | Admitting: Internal Medicine

## 2017-06-22 ENCOUNTER — Encounter (HOSPITAL_COMMUNITY): Payer: Medicare Other

## 2017-06-22 DIAGNOSIS — M7989 Other specified soft tissue disorders: Principal | ICD-10-CM

## 2017-06-22 DIAGNOSIS — M79605 Pain in left leg: Secondary | ICD-10-CM

## 2017-06-22 DIAGNOSIS — R600 Localized edema: Secondary | ICD-10-CM | POA: Insufficient documentation

## 2017-06-22 DIAGNOSIS — I82442 Acute embolism and thrombosis of left tibial vein: Secondary | ICD-10-CM | POA: Insufficient documentation

## 2017-06-22 DIAGNOSIS — E7849 Other hyperlipidemia: Secondary | ICD-10-CM | POA: Diagnosis not present

## 2017-06-22 DIAGNOSIS — Z6831 Body mass index (BMI) 31.0-31.9, adult: Secondary | ICD-10-CM | POA: Diagnosis not present

## 2017-06-25 ENCOUNTER — Encounter (HOSPITAL_COMMUNITY): Payer: Medicare Other

## 2017-06-26 DIAGNOSIS — Z6831 Body mass index (BMI) 31.0-31.9, adult: Secondary | ICD-10-CM | POA: Diagnosis not present

## 2017-06-26 DIAGNOSIS — Z941 Heart transplant status: Secondary | ICD-10-CM | POA: Diagnosis not present

## 2017-06-26 DIAGNOSIS — I82409 Acute embolism and thrombosis of unspecified deep veins of unspecified lower extremity: Secondary | ICD-10-CM | POA: Diagnosis not present

## 2017-06-26 DIAGNOSIS — R600 Localized edema: Secondary | ICD-10-CM | POA: Diagnosis not present

## 2017-06-27 ENCOUNTER — Encounter (HOSPITAL_COMMUNITY): Payer: Medicare Other

## 2017-07-09 DIAGNOSIS — R82998 Other abnormal findings in urine: Secondary | ICD-10-CM | POA: Diagnosis not present

## 2017-07-10 DIAGNOSIS — I10 Essential (primary) hypertension: Secondary | ICD-10-CM | POA: Diagnosis not present

## 2017-07-10 DIAGNOSIS — E7849 Other hyperlipidemia: Secondary | ICD-10-CM | POA: Diagnosis not present

## 2017-07-10 DIAGNOSIS — R7301 Impaired fasting glucose: Secondary | ICD-10-CM | POA: Diagnosis not present

## 2017-07-10 DIAGNOSIS — E038 Other specified hypothyroidism: Secondary | ICD-10-CM | POA: Diagnosis not present

## 2017-07-13 DIAGNOSIS — I10 Essential (primary) hypertension: Secondary | ICD-10-CM | POA: Diagnosis not present

## 2017-07-13 DIAGNOSIS — E7849 Other hyperlipidemia: Secondary | ICD-10-CM | POA: Diagnosis not present

## 2017-07-13 DIAGNOSIS — R7301 Impaired fasting glucose: Secondary | ICD-10-CM | POA: Diagnosis not present

## 2017-07-13 DIAGNOSIS — Z0000 Encounter for general adult medical examination without abnormal findings: Secondary | ICD-10-CM | POA: Diagnosis not present

## 2017-07-13 DIAGNOSIS — E611 Iron deficiency: Secondary | ICD-10-CM | POA: Diagnosis not present

## 2017-07-16 DIAGNOSIS — I701 Atherosclerosis of renal artery: Secondary | ICD-10-CM | POA: Diagnosis not present

## 2017-07-16 DIAGNOSIS — Z79899 Other long term (current) drug therapy: Secondary | ICD-10-CM | POA: Diagnosis not present

## 2017-07-16 DIAGNOSIS — R0602 Shortness of breath: Secondary | ICD-10-CM | POA: Diagnosis not present

## 2017-07-16 DIAGNOSIS — Z4821 Encounter for aftercare following heart transplant: Secondary | ICD-10-CM | POA: Diagnosis not present

## 2017-07-16 DIAGNOSIS — Z5181 Encounter for therapeutic drug level monitoring: Secondary | ICD-10-CM | POA: Diagnosis not present

## 2017-07-16 DIAGNOSIS — I82409 Acute embolism and thrombosis of unspecified deep veins of unspecified lower extremity: Secondary | ICD-10-CM | POA: Diagnosis not present

## 2017-07-24 DIAGNOSIS — Z1212 Encounter for screening for malignant neoplasm of rectum: Secondary | ICD-10-CM | POA: Diagnosis not present

## 2017-08-02 DIAGNOSIS — Z941 Heart transplant status: Secondary | ICD-10-CM | POA: Diagnosis not present

## 2017-08-16 DIAGNOSIS — R079 Chest pain, unspecified: Secondary | ICD-10-CM | POA: Diagnosis not present

## 2017-08-16 DIAGNOSIS — Z79899 Other long term (current) drug therapy: Secondary | ICD-10-CM | POA: Diagnosis not present

## 2017-08-16 DIAGNOSIS — R002 Palpitations: Secondary | ICD-10-CM | POA: Diagnosis not present

## 2017-08-29 DIAGNOSIS — Z4821 Encounter for aftercare following heart transplant: Secondary | ICD-10-CM | POA: Diagnosis not present

## 2017-08-29 DIAGNOSIS — Z941 Heart transplant status: Secondary | ICD-10-CM | POA: Diagnosis not present

## 2017-08-29 DIAGNOSIS — R9439 Abnormal result of other cardiovascular function study: Secondary | ICD-10-CM | POA: Diagnosis not present

## 2017-08-29 DIAGNOSIS — R9431 Abnormal electrocardiogram [ECG] [EKG]: Secondary | ICD-10-CM | POA: Diagnosis not present

## 2017-09-05 DIAGNOSIS — H903 Sensorineural hearing loss, bilateral: Secondary | ICD-10-CM | POA: Diagnosis not present

## 2017-09-07 DIAGNOSIS — Z941 Heart transplant status: Secondary | ICD-10-CM | POA: Diagnosis not present

## 2017-10-11 DIAGNOSIS — D1801 Hemangioma of skin and subcutaneous tissue: Secondary | ICD-10-CM | POA: Diagnosis not present

## 2017-10-11 DIAGNOSIS — L821 Other seborrheic keratosis: Secondary | ICD-10-CM | POA: Diagnosis not present

## 2017-10-11 DIAGNOSIS — D225 Melanocytic nevi of trunk: Secondary | ICD-10-CM | POA: Diagnosis not present

## 2017-10-11 DIAGNOSIS — D2221 Melanocytic nevi of right ear and external auricular canal: Secondary | ICD-10-CM | POA: Diagnosis not present

## 2017-10-15 DIAGNOSIS — R7989 Other specified abnormal findings of blood chemistry: Secondary | ICD-10-CM | POA: Diagnosis not present

## 2017-10-15 DIAGNOSIS — I700 Atherosclerosis of aorta: Secondary | ICD-10-CM | POA: Diagnosis not present

## 2017-10-15 DIAGNOSIS — Z4821 Encounter for aftercare following heart transplant: Secondary | ICD-10-CM | POA: Diagnosis not present

## 2017-10-15 DIAGNOSIS — R918 Other nonspecific abnormal finding of lung field: Secondary | ICD-10-CM | POA: Diagnosis not present

## 2017-10-15 DIAGNOSIS — Z7982 Long term (current) use of aspirin: Secondary | ICD-10-CM | POA: Diagnosis not present

## 2017-10-15 DIAGNOSIS — Z941 Heart transplant status: Secondary | ICD-10-CM | POA: Diagnosis not present

## 2017-10-17 ENCOUNTER — Other Ambulatory Visit: Payer: Self-pay | Admitting: Gastroenterology

## 2017-10-17 DIAGNOSIS — R1310 Dysphagia, unspecified: Secondary | ICD-10-CM | POA: Diagnosis not present

## 2017-10-17 DIAGNOSIS — R1010 Upper abdominal pain, unspecified: Secondary | ICD-10-CM | POA: Diagnosis not present

## 2017-10-17 DIAGNOSIS — K219 Gastro-esophageal reflux disease without esophagitis: Secondary | ICD-10-CM | POA: Diagnosis not present

## 2017-10-19 ENCOUNTER — Ambulatory Visit
Admission: RE | Admit: 2017-10-19 | Discharge: 2017-10-19 | Disposition: A | Discharge status: Home / Self Care | Payer: Medicare Other | Source: Ambulatory Visit | Attending: Gastroenterology | Admitting: Gastroenterology

## 2017-10-19 DIAGNOSIS — R1310 Dysphagia, unspecified: Secondary | ICD-10-CM

## 2017-10-19 DIAGNOSIS — K449 Diaphragmatic hernia without obstruction or gangrene: Secondary | ICD-10-CM | POA: Diagnosis not present

## 2017-10-30 DIAGNOSIS — M545 Low back pain: Secondary | ICD-10-CM | POA: Diagnosis not present

## 2017-10-30 DIAGNOSIS — Z6831 Body mass index (BMI) 31.0-31.9, adult: Secondary | ICD-10-CM | POA: Diagnosis not present

## 2017-10-31 ENCOUNTER — Other Ambulatory Visit: Payer: Self-pay

## 2017-10-31 ENCOUNTER — Ambulatory Visit: Payer: Medicare Other | Attending: Internal Medicine

## 2017-10-31 DIAGNOSIS — R252 Cramp and spasm: Secondary | ICD-10-CM | POA: Diagnosis not present

## 2017-10-31 DIAGNOSIS — M545 Low back pain, unspecified: Secondary | ICD-10-CM

## 2017-10-31 DIAGNOSIS — R293 Abnormal posture: Secondary | ICD-10-CM | POA: Diagnosis not present

## 2017-10-31 DIAGNOSIS — M546 Pain in thoracic spine: Secondary | ICD-10-CM | POA: Diagnosis not present

## 2017-10-31 NOTE — Patient Instructions (Addendum)
Posture - Standing   Good posture is important. Avoid slouching and forward head thrust. Maintain curve in low back and align ears over shoulders, hips over ankles.  Pull your belly button in toward your back bone. Posture Tips DO: - stand tall and erect - keep chin tucked in - keep head and shoulders in alignment - check posture regularly in mirror or large window - pull head back against headrest in car seat;  Change your position often.  Sit with lumbar support. DON'T: - slouch or slump while watching TV or reading - sit, stand or lie in one position  for too long;  Sitting is especially hard on the spine so if you sit at a desk/use the computer, then stand up often! Copyright  VHI. All rights reserved.  Posture - Sitting  Sit upright, head facing forward. Try using a roll to support lower back. Keep shoulders relaxed, and avoid rounded back. Keep hips level with knees. Avoid crossing legs for long periods. Copyright  VHI. All rights reserved.  Chronic neck strain can develop because of poor posture and faulty work habits  Postural strain related to slumped sitting and forward head posture is a leading cause of headaches, neck and upper back pain  General strengthening and flexibility exercises are helpful in the treatment of neck pain.  Most importantly, you should learn to correct the posture that may be contributing to chronic pain.   Change positions frequently  Change your work or home environment to improve posture and mechanics.   KEEP HEAD IN NEUTRAL AND SHOULDERS DOWN AND RELAXED   Hold tubing in right hand, arm forward. Pull arm back, elbow straight. Repeat __10__ times per set. Do __2__ sets per session. Do _1-2___ sessions per day.  Copyright  VHI. All rights reserved.     With resistive band anchored in door, grasp both ends. Keeping elbows bent, pull back, squeezing shoulder blades together. Hold _3__ seconds. Repeat _2x10___ times. Do _1-2___ sessions per  day.  http://gt2.exer.us/98   Scapular Retraction (Standing)    With arms at sides, pinch shoulder blades together.  Hold 5 seconds Repeat _10___ times per set. Do ____ sets per session. Do __many__ sessions per day.  http://orth.exer.us/945   Copyright  VHI. All rights reserved.  [facility] [address], [address] [place], [place] [address] Phone # [phone] Fax [phone]

## 2017-10-31 NOTE — Therapy (Signed)
[facility] [address] [place], [place], [address] Phone: [phone]   Fax:  [phone]  Physical Therapy Evaluation  Patient Details  Name: [name] [name] MRN: [id number] Date of Birth: 22-Feb-1941 Referring Provider: Martha Clan, MD   Encounter Date: 10/31/2017  PT End of Session - 10/31/17 1540    Visit Number  1    Date for PT Re-Evaluation  12/26/17    Authorization Type  Medicare- KX at 15    PT Start Time  1445    PT Stop Time  1535    PT Time Calculation (min)  50 min    Activity Tolerance  Patient tolerated treatment well    Behavior During Therapy  [facility] for tasks assessed/performed       Past Medical History:  Diagnosis Date  . A-fib (HCC)    resolved  . Anxiety   . CHF (congestive heart failure) (HCC)    resolved  . Depression   . DVT (deep venous thrombosis) (HCC)   . GERD (gastroesophageal reflux disease)   . Heart transplanted [facility])    Followed at [facility] - Dr. Laurence Compton  . HTN (hypertension)   . Hyperparathyroidism (HCC)    resolved  . Osteoporosis   . Pacemaker    resolved    Past Surgical History:  Procedure Laterality Date  . ABDOMINAL HYSTERECTOMY    . ABLATION  2007  . ABLATION ON ENDOMETRIOSIS    . APPENDECTOMY    . CARDIAC CATHETERIZATION  2009  . HEART TRANSPLANT  2009  . HEART TRANSPLANT    . NECK SURGERY     removal of parathyroid  . PACEMAKER INSERTION  2005  . TOTAL ABDOMINAL HYSTERECTOMY W/ BILATERAL SALPINGOOPHORECTOMY      There were no vitals filed for this visit.   Subjective Assessment - 10/31/17 1454    Subjective  Pt presents to PT with complaints of thoracic and low back pain that began that began 1 week ago without cause or incident.  Pt reports that she has been responsible for more at home as her husband is ill.  Pt has been sitting at computer with forward flexed posture.      Pertinent History  heart transplant 2009    Limitations   Sitting;Standing;Walking    How long can you sit comfortably?  hard back chair/driving: 15 minutes    How long can you stand comfortably?  15 minutes    How long can you walk comfortably?  15 minutes     Diagnostic tests  none    Patient Stated Goals  reduce low back pain, sit/stand/walk longer    Currently in Pain?  Yes    Pain Score  7  3-9/10    Pain Location  Back    Pain Orientation  Mid;Lower;Left;Right    Pain Descriptors / Indicators  Aching    Pain Type  Acute pain    Pain Onset  1 to 4 weeks ago    Pain Frequency  Constant    Aggravating Factors   sitting/standing/walking    Pain Relieving Factors  morning hours, pain medication    Effect of Pain on Daily Activities  hard to care for husband         [facility] PT Assessment - 10/31/17 0001      Assessment   Medical Diagnosis  lumbago    Referring Provider  Martha Clan, MD    Onset Date/Surgical Date  10/24/17  Next MD Visit  none    Prior Therapy  none      Precautions   Precautions  Other (comment) heart transplant      Restrictions   Weight Bearing Restrictions  No      Balance Screen   Has the patient fallen in the past 6 months  No    Has the patient had a decrease in activity level because of a fear of falling?   No    Is the patient reluctant to leave their home because of a fear of falling?   No      Home Public house managernvironment   Living Environment  Private residence    Living Arrangements  Spouse/significant other;Other relatives husband with Parkinsons and daughter    Type of Home  House    Home Access  Stairs to enter    Entrance Stairs-Number of Steps  15    Home Layout  One level      Prior Function   Level of Independence  Independent    Vocation  Retired    Leisure  care of husband      Cognition   Overall Cognitive Status  Within Functional Limits for tasks assessed      Observation/Other Assessments   Focus on Therapeutic Outcomes (FOTO)   67% limitation      Posture/Postural Control    Posture/Postural Control  Postural limitations    Postural Limitations  Forward head;Rounded Shoulders;Increased thoracic kyphosis      ROM / Strength   AROM / PROM / Strength  AROM;Strength      AROM   Overall AROM   Within functional limits for tasks performed    Overall AROM Comments  Lumbar A/ROM is full with thoracolumbar pain reported with all movement      Strength   Overall Strength  Deficits    Overall Strength Comments  UE strength 4/5, LE strength 4+/5      Palpation   Spinal mobility  normal spinal mobility with pain with PA mobs    Palpation comment  Pt with palpalble tenderness and trigger points T10-L3 paraspinals      Ambulation/Gait   Gait Pattern  Within Functional Limits             Objective measurements completed on examination: See above findings.              PT Education - 10/31/17 1534    Education provided  Yes    Education Details  posture, scap squeezes, scapular theraband attached    Person(s) Educated  Patient    Methods  Explanation;Demonstration;Handout    Comprehension  Verbalized understanding;Returned demonstration       PT Short Term Goals - 10/31/17 1448      PT SHORT TERM GOAL #1   Title  be independent in initial HEP    Time  4    Period  Weeks    Status  New    Target Date  12/26/17      PT SHORT TERM GOAL #2   Title  report a 25% reduction in LBP/thoracic pain with ADLs and housework    Time  4    Period  Weeks    Status  New    Target Date  11/28/17      PT SHORT TERM GOAL #3   Title  verbalize and demonstrate postural corrections for neutral spine with sitting and ADLs    Time  4    Period  Weeks    Status  New    Target Date  11/28/17        PT Long Term Goals - 10/31/17 1447      PT LONG TERM GOAL #1   Title  be independent in advanced HEP    Time  8    Period  Weeks    Status  New    Target Date  12/26/17      PT LONG TERM GOAL #2   Title  reduce FOTO to < or = to 43% limitation     Time  8    Period  Weeks    Status  New    Target Date  12/26/17      PT LONG TERM GOAL #3   Title  report 60% reduction in LBP/thoracic pain with ADLs and houswork    Time  8    Status  New    Target Date  12/26/17      PT LONG TERM GOAL #4   Title  sit for 30-45 minutes without limitation to allow for driving longer periods    Time  8    Period  Weeks    Status  New    Target Date  12/26/17      PT LONG TERM GOAL #5   Title  stand and walk for 30 minutes to improve function with home and community tasks     Time  8    Period  Weeks    Status  New    Target Date  12/26/17             Plan - 10/31/17 1547    Clinical Impression Statement  Pt presents to PT with complaints of recent onset of thoracolumbar pain that began 1 week ago without cause.  No imaging has been done.  Pt reports that she cares for her husband who has Parkinson's and recent prostate cancer and has had more responsibility around the house due to this.  Pt is limited to sitting, standing and walking for 15 minutes.  PT demonstrates painful lumbar A/ROM, increased thoracic kyphosis and pain and trigger points along T10-L3 spinal segments and paraspinals.  Pt sits with poor posture including forward head and flexed lumbar spine.  Pt will benefit from skilled PT for postural retraining and strengthing, core strength, dry needling/manual therapy and modalities as needed.    History and Personal Factors relevant to plan of care:  heart transplant 2009  anxiety, depression    Clinical Presentation  Stable    Clinical Presentation due to:  recent onset of pain    Clinical Decision Making  Low    Rehab Potential  Good    PT Frequency  2x / week    PT Duration  8 weeks    PT Treatment/Interventions  ADLs/Self Care Home Management;Cryotherapy;Electrical Stimulation;Functional mobility training;Therapeutic activities;Therapeutic exercise;Neuromuscular re-education;Patient/family education;Passive range of  motion;Manual techniques;Dry needling;Taping    PT Next Visit Plan  dry needling to lower thoracic/upper lumbar spine-multifidi, manual, taping for posture/pain, review HEP and postural modifications    Consulted and Agree with Plan of Care  Patient       Patient will benefit from skilled therapeutic intervention in order to improve the following deficits and impairments:  Pain, Improper body mechanics, Increased muscle spasms, Postural dysfunction, Decreased activity tolerance  Visit Diagnosis: Acute bilateral low back pain without sciatica - Plan: PT plan of care cert/re-cert  Abnormal posture - Plan: PT plan of care  cert/re-cert  Pain in thoracic spine - Plan: PT plan of care cert/re-cert  Cramp and spasm - Plan: PT plan of care cert/re-cert     Problem List Patient Active Problem List   Diagnosis Date Noted  . Colitis 07/18/2016  . Depression   . Heart transplanted (HCC)   . HTN (hypertension)      Lorrene Reid, PT 10/31/17 4:00 PM   [facility] [address] [place], [place], [address] Phone: [phone]   Fax:  [phone]  Name: [name] [name] MRN: [id number] Date of Birth: [birth year]

## 2017-11-01 ENCOUNTER — Ambulatory Visit: Payer: Medicare Other | Admitting: Physical Therapy

## 2017-11-01 ENCOUNTER — Encounter: Payer: Self-pay | Admitting: Physical Therapy

## 2017-11-01 DIAGNOSIS — R252 Cramp and spasm: Secondary | ICD-10-CM

## 2017-11-01 DIAGNOSIS — R293 Abnormal posture: Secondary | ICD-10-CM | POA: Diagnosis not present

## 2017-11-01 DIAGNOSIS — M546 Pain in thoracic spine: Secondary | ICD-10-CM

## 2017-11-01 DIAGNOSIS — M545 Low back pain, unspecified: Secondary | ICD-10-CM

## 2017-11-01 NOTE — Therapy (Signed)
[facility] [address] [place], [place], [address] Phone: [phone]   Fax:  [phone]  Physical Therapy Treatment  Patient Details  Name: [name] [name] MRN: [id number] Date of Birth: 1941-03-25 Referring Provider: Martha Clan, MD   Encounter Date: 11/01/2017  PT End of Session - 11/01/17 1437    Visit Number  2    Date for PT Re-Evaluation  12/26/17    Authorization Type  Medicare- KX at 15    PT Start Time  1402    PT Stop Time  1443 6 min dry needling    PT Time Calculation (min)  41 min    Activity Tolerance  Patient tolerated treatment well;No increased pain    Behavior During Therapy  WFL for tasks assessed/performed       Past Medical History:  Diagnosis Date  . A-fib (HCC)    resolved  . Anxiety   . CHF (congestive heart failure) (HCC)    resolved  . Depression   . DVT (deep venous thrombosis) (HCC)   . GERD (gastroesophageal reflux disease)   . Heart transplanted [facility])    Followed at [facility] - Dr. Laurence Compton  . HTN (hypertension)   . Hyperparathyroidism (HCC)    resolved  . Osteoporosis   . Pacemaker    resolved    Past Surgical History:  Procedure Laterality Date  . ABDOMINAL HYSTERECTOMY    . ABLATION  2007  . ABLATION ON ENDOMETRIOSIS    . APPENDECTOMY    . CARDIAC CATHETERIZATION  2009  . HEART TRANSPLANT  2009  . HEART TRANSPLANT    . NECK SURGERY     removal of parathyroid  . PACEMAKER INSERTION  2005  . TOTAL ABDOMINAL HYSTERECTOMY W/ BILATERAL SALPINGOOPHORECTOMY      There were no vitals filed for this visit.  Subjective Assessment - 11/01/17 1403    Subjective  Pt reports that things are going well. She has nothing new to report. Her back is still aching in the same location. There was no extreme pain that she can often have.     Pertinent History  heart transplant 2009    Limitations  Sitting;Standing;Walking    How long can you sit comfortably?  hard back  chair/driving: 15 minutes    How long can you stand comfortably?  15 minutes    How long can you walk comfortably?  15 minutes     Diagnostic tests  none    Patient Stated Goals  reduce low back pain, sit/stand/walk longer    Currently in Pain?  Yes    Pain Score  6     Pain Location  Back    Pain Orientation  Right;Left;Mid;Lower    Pain Descriptors / Indicators  Aching    Pain Type  Acute pain    Pain Onset  1 to 4 weeks ago    Pain Frequency  Constant           [facility] Adult PT Treatment/Exercise - 11/01/17 0001      Exercises   Exercises  Lumbar      Lumbar Exercises: Standing   Other Standing Lumbar Exercises  rows with red TB x10 reps, B shoulder extension with red TB x10 reps (therapist cuing to improve scap retraction)       Lumbar Exercises: Seated   Other Seated Lumbar Exercises  thoracic rotation stretch 5x10 sec Lt/Rt      Manual Therapy   Manual Therapy  Soft tissue mobilization;Joint mobilization    Joint Mobilization  Grade III CPAs T5 to L2 x2 bouts     Soft tissue mobilization  STM upper lumbar and low thoracic paraspinals        Trigger Point Dry Needling - 11/01/17 1443    Consent Given?  Yes    Education Handout Provided  Yes    Muscles Treated Lower Body  -- T10 to L2 Lt/Rt multifidi (+) twitch response noted            PT Education - 11/01/17 1436    Education provided  Yes    Education Details  technique with therex; dry needling info    Person(s) Educated  Patient    Methods  Explanation;Verbal cues;Tactile cues;Handout    Comprehension  Verbalized understanding;Returned demonstration       PT Short Term Goals - 10/31/17 1448      PT SHORT TERM GOAL #1   Title  be independent in initial HEP    Time  4    Period  Weeks    Status  New    Target Date  12/26/17      PT SHORT TERM GOAL #2   Title  report a 25% reduction in LBP/thoracic pain with ADLs and housework    Time  4    Period  Weeks    Status  New    Target Date   11/28/17      PT SHORT TERM GOAL #3   Title  verbalize and demonstrate postural corrections for neutral spine with sitting and ADLs    Time  4    Period  Weeks    Status  New    Target Date  11/28/17        PT Long Term Goals - 10/31/17 1447      PT LONG TERM GOAL #1   Title  be independent in advanced HEP    Time  8    Period  Weeks    Status  New    Target Date  12/26/17      PT LONG TERM GOAL #2   Title  reduce FOTO to < or = to 43% limitation    Time  8    Period  Weeks    Status  New    Target Date  12/26/17      PT LONG TERM GOAL #3   Title  report 60% reduction in LBP/thoracic pain with ADLs and houswork    Time  8    Status  New    Target Date  12/26/17      PT LONG TERM GOAL #4   Title  sit for 30-45 minutes without limitation to allow for driving longer periods    Time  8    Period  Weeks    Status  New    Target Date  12/26/17      PT LONG TERM GOAL #5   Title  stand and walk for 30 minutes to improve function with home and community tasks     Time  8    Period  Weeks    Status  New    Target Date  12/26/17            Plan - 11/01/17 1438    Clinical Impression Statement  Pt arrives with minimal improvements in pain following her evaluation yesterday. Therapist reviewed HEP provided last session and provided feedback concerning proper mechanics with these exercises. Completed  manual treatment and dry needling along the lower thoracic and lumbar multifidi, with twitch response noted. End of session, pt reported no increase in pain and verbalized good understanding of dry needling information provided. Will continue with current POC.    Rehab Potential  Good    PT Frequency  2x / week    PT Duration  8 weeks    PT Treatment/Interventions  ADLs/Self Care Home Management;Cryotherapy;Electrical Stimulation;Functional mobility training;Therapeutic activities;Therapeutic exercise;Neuromuscular re-education;Patient/family education;Passive range of  motion;Manual techniques;Dry needling;Taping    PT Next Visit Plan  dry needling to lower thoracic/upper lumbar spine-multifidi as needed, manual, taping for posture/pain, postural strengthening    Consulted and Agree with Plan of Care  Patient       Patient will benefit from skilled therapeutic intervention in order to improve the following deficits and impairments:  Pain, Improper body mechanics, Increased muscle spasms, Postural dysfunction, Decreased activity tolerance  Visit Diagnosis: Acute bilateral low back pain without sciatica  Abnormal posture  Pain in thoracic spine  Cramp and spasm     Problem List Patient Active Problem List   Diagnosis Date Noted  . Colitis 07/18/2016  . Depression   . Heart transplanted (HCC)   . HTN (hypertension)     2:57 PM,11/01/17 Donita Brooks PT, DPT [facility]  [phone]  [facility] [address] [place], [place], [address] Phone: [phone]   Fax:  [phone]  Name: [name] [name] MRN: [id number] Date of Birth: 10-Oct-1940

## 2017-11-01 NOTE — Patient Instructions (Signed)

## 2017-11-06 ENCOUNTER — Encounter: Payer: Medicare Other | Admitting: Physical Therapy

## 2017-11-07 ENCOUNTER — Ambulatory Visit (INDEPENDENT_AMBULATORY_CARE_PROVIDER_SITE_OTHER): Payer: Medicare Other | Admitting: Physician Assistant

## 2017-11-11 DIAGNOSIS — Z941 Heart transplant status: Secondary | ICD-10-CM | POA: Diagnosis not present

## 2017-11-12 ENCOUNTER — Ambulatory Visit: Payer: Medicare Other

## 2017-11-12 DIAGNOSIS — M545 Low back pain, unspecified: Secondary | ICD-10-CM

## 2017-11-12 DIAGNOSIS — R293 Abnormal posture: Secondary | ICD-10-CM

## 2017-11-12 DIAGNOSIS — R252 Cramp and spasm: Secondary | ICD-10-CM | POA: Diagnosis not present

## 2017-11-12 DIAGNOSIS — M546 Pain in thoracic spine: Secondary | ICD-10-CM | POA: Diagnosis not present

## 2017-11-12 NOTE — Therapy (Addendum)
[facility] [address], [place], [place], [address] Phone: [phone]   Fax:  [phone]  Physical Therapy Treatment  Patient Details  Name: [name] [name] MRN: [id number] Date of Birth: 1940/10/09 Referring Provider: Marton Redwood, MD   Encounter Date: 11/12/2017  PT End of Session - 11/12/17 1455    Visit Number  3    Date for PT Re-Evaluation  12/26/17    Authorization Type  Medicare- KX at 15    PT Start Time  0938    PT Stop Time  1502    PT Time Calculation (min)  58 min    Activity Tolerance  Patient tolerated treatment well    Behavior During Therapy  [facility] for tasks assessed/performed       Past Medical History:  Diagnosis Date  . A-fib ([place])    resolved  . Anxiety   . CHF (congestive heart failure) ([place])    resolved  . Depression   . DVT (deep venous thrombosis) ([place])   . GERD (gastroesophageal reflux disease)   . 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 Shon Hale  . HTN (hypertension)   . Hyperparathyroidism ([place])    resolved  . Osteoporosis   . Pacemaker    resolved    Past Surgical History:  Procedure Laterality Date  . ABDOMINAL HYSTERECTOMY    . ABLATION  2007  . ABLATION ON ENDOMETRIOSIS    . APPENDECTOMY    . CARDIAC CATHETERIZATION  2009  . HEART TRANSPLANT  2009  . HEART TRANSPLANT    . NECK SURGERY     removal of parathyroid  . PACEMAKER INSERTION  2005  . TOTAL ABDOMINAL HYSTERECTOMY W/ BILATERAL SALPINGOOPHORECTOMY      There were no vitals filed for this visit.  Subjective Assessment - 11/12/17 1407    Subjective  I was sick last week and I feel like I was starting over.  I think the dry needling helped.  I haven't been active due to illness so my pain is OK now.      Currently in Pain?  Yes    Pain Score  2     Pain Location  Back    Pain Orientation  Right;Left;Mid;Lower    Pain Descriptors / Indicators  Aching    Pain Type  Acute pain    Pain Onset  1 to 4 weeks ago    Pain Frequency  Constant    Aggravating Factors   sitting/standing/walking    Pain Relieving Factors  morning hours, pain medication                      [facility] Adult PT Treatment/Exercise - 11/12/17 0001      Lumbar Exercises: Standing   Other Standing Lumbar Exercises  rows with red TB 2x10 reps, B shoulder extension with red TB 2x10 reps (therapist cuing to improve scap retraction)       Lumbar Exercises: Seated   Other Seated Lumbar Exercises  thoracic rotation stretch 5x10 sec Lt/Rt      Modalities   Modalities  Moist Heat      Moist Heat Therapy   Number Minutes Moist Heat  10 Minutes    Moist Heat Location  Lumbar Spine thoracic      Manual Therapy   Manual Therapy  Soft tissue mobilization;Joint mobilization    Soft tissue mobilization  STM upper lumbar and low thoracic paraspinals  Trigger Point Dry Needling - 11/12/17 1413    Consent Given?  Yes    Muscles Treated Lower Body  -- bil thoracic/lumbar multifidi T7-L2             PT Short Term Goals - 11/12/17 1414      PT SHORT TERM GOAL #1   Title  be independent in initial HEP    Status  Achieved        PT Long Term Goals - 10/31/17 1447      PT LONG TERM GOAL #1   Title  be independent in advanced HEP    Time  8    Period  Weeks    Status  New    Target Date  12/26/17      PT LONG TERM GOAL #2   Title  reduce FOTO to < or = to 43% limitation    Time  8    Period  Weeks    Status  New    Target Date  12/26/17      PT LONG TERM GOAL #3   Title  report 60% reduction in LBP/thoracic pain with ADLs and houswork    Time  8    Status  New    Target Date  12/26/17      PT LONG TERM GOAL #4   Title  sit for 30-45 minutes without limitation to allow for driving longer periods    Time  8    Period  Weeks    Status  New    Target Date  12/26/17      PT LONG TERM GOAL #5   Title  stand and walk for 30 minutes to improve function with home  and community tasks     Time  8    Period  Weeks    Status  New    Target Date  12/26/17            Plan - 11/12/17 1414    Clinical Impression Statement  Pt was sick last week so she spent most of the week in bed and not able to do exercises.  Pt had positive response to dry needling last session.  Pt demonstrated tension in bil thoracic and lumbar multifidi and demonstrated improved mobility and reduced stiffness/pain after dry needling and manual therapy today.  Pt required tactile cues for scapular depression with scapular strength exercises today.  Pt will continue to benefit from skilled PT for scapular strength, manual, dry needling if helpful and gentle flexibility.      Rehab Potential  Good    PT Frequency  2x / week    PT Duration  8 weeks    PT Treatment/Interventions  ADLs/Self Care Home Management;Cryotherapy;Electrical Stimulation;Functional mobility training;Therapeutic activities;Therapeutic exercise;Neuromuscular re-education;Patient/family education;Passive range of motion;Manual techniques;Dry needling;Taping    PT Next Visit Plan  assess response to dry needling, manual, taping for posture/pain, postural strengthening.  Try decompression exercises    Consulted and Agree with Plan of Care  Patient       Patient will benefit from skilled therapeutic intervention in order to improve the following deficits and impairments:  Pain, Improper body mechanics, Increased muscle spasms, Postural dysfunction, Decreased activity tolerance  Visit Diagnosis: Acute bilateral low back pain without sciatica  Abnormal posture  Pain in thoracic spine  Cramp and spasm     Problem List Patient Active Problem List   Diagnosis Date Noted  . Colitis 07/18/2016  . Depression   .  Heart transplanted ([place])   . HTN (hypertension)    Sigurd Sos, PT 11/12/17 3:00 PM PHYSICAL THERAPY DISCHARGE SUMMARY  Visits from Start of Care: 3  Current functional level related to goals /  functional outcomes: Pt attended 3 PT sessions and called to request D/C as she is feeling better and also having other medical issues.   Remaining deficits: See above for most current status.     Education / Equipment: HEP, posture/body mechanics  Plan: Patient agrees to discharge.  Patient goals were partially met. Patient is being discharged due to the patient's request.  ?????        Sigurd Sos, PT 11/21/17 8:05 AM   [facility] [address] [place], [place], [address] Phone: [phone]   Fax:  [phone]  Name: [name] [name] MRN: [id number] Date of Birth: December 09, 1940

## 2017-11-14 ENCOUNTER — Encounter: Payer: Medicare Other | Admitting: Physical Therapy

## 2017-11-21 ENCOUNTER — Encounter: Payer: Medicare Other | Admitting: Physical Therapy

## 2017-11-28 ENCOUNTER — Encounter: Payer: Medicare Other | Admitting: Physical Therapy

## 2017-12-03 DIAGNOSIS — R51 Headache: Secondary | ICD-10-CM | POA: Diagnosis not present

## 2017-12-03 DIAGNOSIS — I82409 Acute embolism and thrombosis of unspecified deep veins of unspecified lower extremity: Secondary | ICD-10-CM | POA: Diagnosis not present

## 2017-12-03 DIAGNOSIS — M545 Low back pain: Secondary | ICD-10-CM | POA: Diagnosis not present

## 2017-12-03 DIAGNOSIS — M5416 Radiculopathy, lumbar region: Secondary | ICD-10-CM | POA: Diagnosis not present

## 2017-12-15 DIAGNOSIS — Z941 Heart transplant status: Secondary | ICD-10-CM | POA: Diagnosis not present

## 2017-12-26 DIAGNOSIS — K224 Dyskinesia of esophagus: Secondary | ICD-10-CM | POA: Diagnosis not present

## 2017-12-26 DIAGNOSIS — K219 Gastro-esophageal reflux disease without esophagitis: Secondary | ICD-10-CM | POA: Diagnosis not present

## 2017-12-26 DIAGNOSIS — K449 Diaphragmatic hernia without obstruction or gangrene: Secondary | ICD-10-CM | POA: Diagnosis not present

## 2017-12-26 DIAGNOSIS — R194 Change in bowel habit: Secondary | ICD-10-CM | POA: Diagnosis not present

## 2018-01-19 DIAGNOSIS — Z941 Heart transplant status: Secondary | ICD-10-CM | POA: Diagnosis not present

## 2018-02-21 DIAGNOSIS — Z941 Heart transplant status: Secondary | ICD-10-CM | POA: Diagnosis not present

## 2018-03-24 DIAGNOSIS — Z941 Heart transplant status: Secondary | ICD-10-CM | POA: Diagnosis not present

## 2018-04-15 DIAGNOSIS — Z86718 Personal history of other venous thrombosis and embolism: Secondary | ICD-10-CM | POA: Diagnosis not present

## 2018-04-15 DIAGNOSIS — Z941 Heart transplant status: Secondary | ICD-10-CM | POA: Diagnosis not present

## 2018-04-15 DIAGNOSIS — Z79899 Other long term (current) drug therapy: Secondary | ICD-10-CM | POA: Diagnosis not present

## 2018-04-15 DIAGNOSIS — I10 Essential (primary) hypertension: Secondary | ICD-10-CM | POA: Diagnosis not present

## 2018-04-16 DIAGNOSIS — Z941 Heart transplant status: Secondary | ICD-10-CM | POA: Diagnosis not present

## 2018-04-28 DIAGNOSIS — Z941 Heart transplant status: Secondary | ICD-10-CM | POA: Diagnosis not present

## 2018-04-30 DIAGNOSIS — I700 Atherosclerosis of aorta: Secondary | ICD-10-CM | POA: Diagnosis not present

## 2018-04-30 DIAGNOSIS — R911 Solitary pulmonary nodule: Secondary | ICD-10-CM | POA: Diagnosis not present

## 2018-04-30 DIAGNOSIS — Z780 Asymptomatic menopausal state: Secondary | ICD-10-CM | POA: Diagnosis not present

## 2018-04-30 DIAGNOSIS — J479 Bronchiectasis, uncomplicated: Secondary | ICD-10-CM | POA: Diagnosis not present

## 2018-04-30 DIAGNOSIS — M810 Age-related osteoporosis without current pathological fracture: Secondary | ICD-10-CM | POA: Diagnosis not present

## 2018-04-30 DIAGNOSIS — M8588 Other specified disorders of bone density and structure, other site: Secondary | ICD-10-CM | POA: Diagnosis not present

## 2018-05-02 DIAGNOSIS — K5900 Constipation, unspecified: Secondary | ICD-10-CM | POA: Diagnosis not present

## 2018-05-02 DIAGNOSIS — Z23 Encounter for immunization: Secondary | ICD-10-CM | POA: Diagnosis not present

## 2018-05-02 DIAGNOSIS — R5383 Other fatigue: Secondary | ICD-10-CM | POA: Diagnosis not present

## 2018-05-02 DIAGNOSIS — R229 Localized swelling, mass and lump, unspecified: Secondary | ICD-10-CM | POA: Diagnosis not present

## 2018-05-15 ENCOUNTER — Other Ambulatory Visit: Payer: Self-pay | Admitting: Physician Assistant

## 2018-05-15 DIAGNOSIS — R110 Nausea: Secondary | ICD-10-CM

## 2018-05-15 DIAGNOSIS — R194 Change in bowel habit: Secondary | ICD-10-CM

## 2018-05-15 DIAGNOSIS — R1010 Upper abdominal pain, unspecified: Secondary | ICD-10-CM

## 2018-05-15 DIAGNOSIS — K5901 Slow transit constipation: Secondary | ICD-10-CM | POA: Diagnosis not present

## 2018-05-23 ENCOUNTER — Ambulatory Visit
Admission: RE | Admit: 2018-05-23 | Discharge: 2018-05-23 | Disposition: A | Discharge status: Home / Self Care | Payer: Medicare Other | Source: Ambulatory Visit | Attending: Physician Assistant | Admitting: Physician Assistant

## 2018-05-23 DIAGNOSIS — R194 Change in bowel habit: Secondary | ICD-10-CM

## 2018-05-23 DIAGNOSIS — R110 Nausea: Secondary | ICD-10-CM

## 2018-05-23 DIAGNOSIS — R1010 Upper abdominal pain, unspecified: Secondary | ICD-10-CM

## 2018-05-27 DIAGNOSIS — Z941 Heart transplant status: Secondary | ICD-10-CM | POA: Diagnosis not present

## 2018-06-03 DIAGNOSIS — R5383 Other fatigue: Secondary | ICD-10-CM | POA: Diagnosis not present

## 2018-06-03 DIAGNOSIS — R197 Diarrhea, unspecified: Secondary | ICD-10-CM | POA: Diagnosis not present

## 2018-06-03 DIAGNOSIS — K589 Irritable bowel syndrome without diarrhea: Secondary | ICD-10-CM | POA: Diagnosis not present

## 2018-06-03 DIAGNOSIS — Z6830 Body mass index (BMI) 30.0-30.9, adult: Secondary | ICD-10-CM | POA: Diagnosis not present

## 2018-06-03 DIAGNOSIS — E039 Hypothyroidism, unspecified: Secondary | ICD-10-CM | POA: Diagnosis not present

## 2018-06-04 DIAGNOSIS — R197 Diarrhea, unspecified: Secondary | ICD-10-CM | POA: Diagnosis not present

## 2018-06-14 ENCOUNTER — Ambulatory Visit
Admission: RE | Admit: 2018-06-14 | Discharge: 2018-06-14 | Disposition: A | Discharge status: Home / Self Care | Payer: Medicare Other | Source: Ambulatory Visit | Attending: Gastroenterology | Admitting: Gastroenterology

## 2018-06-14 ENCOUNTER — Other Ambulatory Visit: Payer: Self-pay | Admitting: Gastroenterology

## 2018-06-14 DIAGNOSIS — R198 Other specified symptoms and signs involving the digestive system and abdomen: Secondary | ICD-10-CM

## 2018-06-14 DIAGNOSIS — R194 Change in bowel habit: Secondary | ICD-10-CM | POA: Diagnosis not present

## 2018-06-17 ENCOUNTER — Ambulatory Visit
Admission: RE | Admit: 2018-06-17 | Discharge: 2018-06-17 | Disposition: A | Discharge status: Home / Self Care | Payer: Medicare Other | Source: Ambulatory Visit | Attending: Gastroenterology | Admitting: Gastroenterology

## 2018-06-17 ENCOUNTER — Other Ambulatory Visit: Payer: Self-pay | Admitting: Gastroenterology

## 2018-06-17 DIAGNOSIS — R198 Other specified symptoms and signs involving the digestive system and abdomen: Secondary | ICD-10-CM

## 2018-06-17 DIAGNOSIS — K5900 Constipation, unspecified: Secondary | ICD-10-CM | POA: Diagnosis not present

## 2018-06-18 DIAGNOSIS — R152 Fecal urgency: Secondary | ICD-10-CM | POA: Diagnosis not present

## 2018-06-18 DIAGNOSIS — R194 Change in bowel habit: Secondary | ICD-10-CM | POA: Diagnosis not present

## 2018-06-28 DIAGNOSIS — Z941 Heart transplant status: Secondary | ICD-10-CM | POA: Diagnosis not present

## 2018-07-16 DIAGNOSIS — I10 Essential (primary) hypertension: Secondary | ICD-10-CM | POA: Diagnosis not present

## 2018-07-16 DIAGNOSIS — M810 Age-related osteoporosis without current pathological fracture: Secondary | ICD-10-CM | POA: Diagnosis not present

## 2018-07-16 DIAGNOSIS — E7849 Other hyperlipidemia: Secondary | ICD-10-CM | POA: Diagnosis not present

## 2018-07-16 DIAGNOSIS — E039 Hypothyroidism, unspecified: Secondary | ICD-10-CM | POA: Diagnosis not present

## 2018-07-23 DIAGNOSIS — I10 Essential (primary) hypertension: Secondary | ICD-10-CM | POA: Diagnosis not present

## 2018-07-23 DIAGNOSIS — E611 Iron deficiency: Secondary | ICD-10-CM | POA: Diagnosis not present

## 2018-07-23 DIAGNOSIS — E7849 Other hyperlipidemia: Secondary | ICD-10-CM | POA: Diagnosis not present

## 2018-07-23 DIAGNOSIS — Z0000 Encounter for general adult medical examination without abnormal findings: Secondary | ICD-10-CM | POA: Diagnosis not present

## 2018-07-29 DIAGNOSIS — Z941 Heart transplant status: Secondary | ICD-10-CM | POA: Diagnosis not present

## 2018-08-06 ENCOUNTER — Other Ambulatory Visit (HOSPITAL_COMMUNITY): Payer: Self-pay | Admitting: *Deleted

## 2018-08-07 ENCOUNTER — Encounter (HOSPITAL_COMMUNITY)
Admission: RE | Admit: 2018-08-07 | Discharge: 2018-08-07 | Disposition: A | Discharge status: Home / Self Care | Payer: Medicare Other | Source: Ambulatory Visit | Attending: Internal Medicine | Admitting: Internal Medicine

## 2018-08-07 DIAGNOSIS — D649 Anemia, unspecified: Secondary | ICD-10-CM | POA: Insufficient documentation

## 2018-08-07 MED ORDER — SODIUM CHLORIDE 0.9 % IV SOLN
510.0000 mg | INTRAVENOUS | Status: DC
Start: 2018-08-07 — End: 2018-08-08
  Administered 2018-08-07: 510 mg via INTRAVENOUS
  Filled 2018-08-07: qty 17

## 2018-08-07 NOTE — Discharge Instructions (Signed)

## 2018-08-14 ENCOUNTER — Encounter (HOSPITAL_COMMUNITY): Payer: Medicare Other

## 2018-08-31 DIAGNOSIS — Z941 Heart transplant status: Secondary | ICD-10-CM | POA: Diagnosis not present

## 2018-09-10 DIAGNOSIS — H524 Presbyopia: Secondary | ICD-10-CM | POA: Diagnosis not present

## 2018-09-30 DIAGNOSIS — Z941 Heart transplant status: Secondary | ICD-10-CM | POA: Diagnosis not present

## 2018-10-16 DIAGNOSIS — L570 Actinic keratosis: Secondary | ICD-10-CM | POA: Diagnosis not present

## 2018-10-16 DIAGNOSIS — D485 Neoplasm of uncertain behavior of skin: Secondary | ICD-10-CM | POA: Diagnosis not present

## 2018-10-16 DIAGNOSIS — L821 Other seborrheic keratosis: Secondary | ICD-10-CM | POA: Diagnosis not present

## 2018-10-16 DIAGNOSIS — C4441 Basal cell carcinoma of skin of scalp and neck: Secondary | ICD-10-CM | POA: Diagnosis not present

## 2018-10-16 DIAGNOSIS — D1801 Hemangioma of skin and subcutaneous tissue: Secondary | ICD-10-CM | POA: Diagnosis not present

## 2018-10-16 DIAGNOSIS — C4442 Squamous cell carcinoma of skin of scalp and neck: Secondary | ICD-10-CM | POA: Diagnosis not present

## 2018-10-24 ENCOUNTER — Other Ambulatory Visit: Payer: Self-pay | Admitting: Internal Medicine

## 2018-10-24 DIAGNOSIS — Z1231 Encounter for screening mammogram for malignant neoplasm of breast: Secondary | ICD-10-CM

## 2018-10-25 ENCOUNTER — Ambulatory Visit
Admission: RE | Admit: 2018-10-25 | Discharge: 2018-10-25 | Disposition: A | Discharge status: Home / Self Care | Payer: Medicare Other | Source: Ambulatory Visit | Attending: Internal Medicine | Admitting: Internal Medicine

## 2018-10-25 DIAGNOSIS — R194 Change in bowel habit: Secondary | ICD-10-CM | POA: Diagnosis not present

## 2018-10-25 DIAGNOSIS — Z1231 Encounter for screening mammogram for malignant neoplasm of breast: Secondary | ICD-10-CM | POA: Diagnosis not present

## 2018-10-25 DIAGNOSIS — R143 Flatulence: Secondary | ICD-10-CM | POA: Diagnosis not present

## 2018-10-25 DIAGNOSIS — R152 Fecal urgency: Secondary | ICD-10-CM | POA: Diagnosis not present

## 2018-10-31 DIAGNOSIS — Z941 Heart transplant status: Secondary | ICD-10-CM | POA: Diagnosis not present

## 2018-11-25 DIAGNOSIS — Z79899 Other long term (current) drug therapy: Secondary | ICD-10-CM | POA: Diagnosis not present

## 2018-11-25 DIAGNOSIS — Z941 Heart transplant status: Secondary | ICD-10-CM | POA: Diagnosis not present

## 2018-11-25 DIAGNOSIS — Z4821 Encounter for aftercare following heart transplant: Secondary | ICD-10-CM | POA: Diagnosis not present

## 2018-11-26 DIAGNOSIS — R7301 Impaired fasting glucose: Secondary | ICD-10-CM | POA: Diagnosis not present

## 2018-12-01 DIAGNOSIS — Z941 Heart transplant status: Secondary | ICD-10-CM | POA: Diagnosis not present

## 2018-12-10 DIAGNOSIS — R079 Chest pain, unspecified: Secondary | ICD-10-CM | POA: Diagnosis not present

## 2018-12-10 DIAGNOSIS — M7989 Other specified soft tissue disorders: Secondary | ICD-10-CM | POA: Diagnosis not present

## 2018-12-10 DIAGNOSIS — Z941 Heart transplant status: Secondary | ICD-10-CM | POA: Diagnosis not present

## 2018-12-10 DIAGNOSIS — I517 Cardiomegaly: Secondary | ICD-10-CM | POA: Diagnosis not present

## 2018-12-10 DIAGNOSIS — R0602 Shortness of breath: Secondary | ICD-10-CM | POA: Diagnosis not present

## 2018-12-10 DIAGNOSIS — R5383 Other fatigue: Secondary | ICD-10-CM | POA: Diagnosis not present

## 2018-12-10 DIAGNOSIS — R9431 Abnormal electrocardiogram [ECG] [EKG]: Secondary | ICD-10-CM | POA: Diagnosis not present

## 2018-12-10 DIAGNOSIS — Z4821 Encounter for aftercare following heart transplant: Secondary | ICD-10-CM | POA: Diagnosis not present

## 2018-12-11 DIAGNOSIS — I701 Atherosclerosis of renal artery: Secondary | ICD-10-CM | POA: Diagnosis not present

## 2018-12-11 DIAGNOSIS — R5382 Chronic fatigue, unspecified: Secondary | ICD-10-CM | POA: Diagnosis not present

## 2018-12-11 DIAGNOSIS — R05 Cough: Secondary | ICD-10-CM | POA: Diagnosis not present

## 2018-12-11 DIAGNOSIS — R0602 Shortness of breath: Secondary | ICD-10-CM | POA: Diagnosis not present

## 2018-12-13 ENCOUNTER — Other Ambulatory Visit: Payer: Self-pay | Admitting: Cardiology

## 2018-12-13 DIAGNOSIS — Z941 Heart transplant status: Secondary | ICD-10-CM

## 2018-12-16 ENCOUNTER — Other Ambulatory Visit: Payer: Medicare Other

## 2019-01-01 ENCOUNTER — Ambulatory Visit
Admission: RE | Admit: 2019-01-01 | Discharge: 2019-01-01 | Disposition: A | Discharge status: Home / Self Care | Payer: Medicare Other | Source: Ambulatory Visit | Attending: Cardiology | Admitting: Cardiology

## 2019-01-01 ENCOUNTER — Other Ambulatory Visit: Payer: Self-pay

## 2019-01-01 DIAGNOSIS — Z941 Heart transplant status: Secondary | ICD-10-CM

## 2019-01-01 DIAGNOSIS — R0602 Shortness of breath: Secondary | ICD-10-CM | POA: Diagnosis not present

## 2019-01-03 DIAGNOSIS — Z941 Heart transplant status: Secondary | ICD-10-CM | POA: Diagnosis not present

## 2019-01-06 DIAGNOSIS — C44622 Squamous cell carcinoma of skin of right upper limb, including shoulder: Secondary | ICD-10-CM | POA: Diagnosis not present

## 2019-01-06 DIAGNOSIS — L988 Other specified disorders of the skin and subcutaneous tissue: Secondary | ICD-10-CM | POA: Diagnosis not present

## 2019-01-23 DIAGNOSIS — Z4821 Encounter for aftercare following heart transplant: Secondary | ICD-10-CM | POA: Diagnosis not present

## 2019-01-23 DIAGNOSIS — Z941 Heart transplant status: Secondary | ICD-10-CM | POA: Diagnosis not present

## 2019-01-29 DIAGNOSIS — R0600 Dyspnea, unspecified: Secondary | ICD-10-CM | POA: Diagnosis not present

## 2019-01-29 DIAGNOSIS — D849 Immunodeficiency, unspecified: Secondary | ICD-10-CM | POA: Diagnosis not present

## 2019-01-29 DIAGNOSIS — Z86718 Personal history of other venous thrombosis and embolism: Secondary | ICD-10-CM | POA: Diagnosis not present

## 2019-01-29 DIAGNOSIS — Z20818 Contact with and (suspected) exposure to other bacterial communicable diseases: Secondary | ICD-10-CM | POA: Diagnosis not present

## 2019-01-29 DIAGNOSIS — R5383 Other fatigue: Secondary | ICD-10-CM | POA: Diagnosis not present

## 2019-01-30 DIAGNOSIS — E0590 Thyrotoxicosis, unspecified without thyrotoxic crisis or storm: Secondary | ICD-10-CM | POA: Diagnosis not present

## 2019-01-30 DIAGNOSIS — R0600 Dyspnea, unspecified: Secondary | ICD-10-CM | POA: Diagnosis not present

## 2019-01-30 DIAGNOSIS — Z87891 Personal history of nicotine dependence: Secondary | ICD-10-CM | POA: Diagnosis not present

## 2019-01-30 DIAGNOSIS — Z941 Heart transplant status: Secondary | ICD-10-CM | POA: Diagnosis not present

## 2019-01-30 DIAGNOSIS — R400 Somnolence: Secondary | ICD-10-CM | POA: Diagnosis not present

## 2019-01-30 DIAGNOSIS — R072 Precordial pain: Secondary | ICD-10-CM | POA: Diagnosis not present

## 2019-01-30 DIAGNOSIS — G4710 Hypersomnia, unspecified: Secondary | ICD-10-CM | POA: Diagnosis not present

## 2019-01-30 DIAGNOSIS — Z79899 Other long term (current) drug therapy: Secondary | ICD-10-CM | POA: Diagnosis not present

## 2019-01-30 DIAGNOSIS — I700 Atherosclerosis of aorta: Secondary | ICD-10-CM | POA: Diagnosis not present

## 2019-01-30 DIAGNOSIS — R918 Other nonspecific abnormal finding of lung field: Secondary | ICD-10-CM | POA: Diagnosis not present

## 2019-01-30 DIAGNOSIS — Z86718 Personal history of other venous thrombosis and embolism: Secondary | ICD-10-CM | POA: Diagnosis not present

## 2019-01-30 DIAGNOSIS — R5383 Other fatigue: Secondary | ICD-10-CM | POA: Diagnosis not present

## 2019-01-30 DIAGNOSIS — R0683 Snoring: Secondary | ICD-10-CM | POA: Diagnosis not present

## 2019-01-30 DIAGNOSIS — R0609 Other forms of dyspnea: Secondary | ICD-10-CM | POA: Diagnosis not present

## 2019-01-30 DIAGNOSIS — Z7901 Long term (current) use of anticoagulants: Secondary | ICD-10-CM | POA: Diagnosis not present

## 2019-01-30 DIAGNOSIS — Z4821 Encounter for aftercare following heart transplant: Secondary | ICD-10-CM | POA: Diagnosis not present

## 2019-01-31 DIAGNOSIS — R0609 Other forms of dyspnea: Secondary | ICD-10-CM | POA: Diagnosis not present

## 2019-01-31 DIAGNOSIS — R072 Precordial pain: Secondary | ICD-10-CM | POA: Diagnosis not present

## 2019-02-08 DIAGNOSIS — Z941 Heart transplant status: Secondary | ICD-10-CM | POA: Diagnosis not present

## 2019-02-10 DIAGNOSIS — R072 Precordial pain: Secondary | ICD-10-CM | POA: Diagnosis not present

## 2019-02-10 DIAGNOSIS — Z86718 Personal history of other venous thrombosis and embolism: Secondary | ICD-10-CM | POA: Diagnosis not present

## 2019-02-10 DIAGNOSIS — R0602 Shortness of breath: Secondary | ICD-10-CM | POA: Diagnosis not present

## 2019-03-17 DIAGNOSIS — Z941 Heart transplant status: Secondary | ICD-10-CM | POA: Diagnosis not present

## 2019-05-15 DIAGNOSIS — R110 Nausea: Secondary | ICD-10-CM | POA: Diagnosis not present

## 2019-05-15 DIAGNOSIS — R5381 Other malaise: Secondary | ICD-10-CM | POA: Diagnosis not present

## 2019-05-15 DIAGNOSIS — R0600 Dyspnea, unspecified: Secondary | ICD-10-CM | POA: Diagnosis not present

## 2019-05-15 DIAGNOSIS — R198 Other specified symptoms and signs involving the digestive system and abdomen: Secondary | ICD-10-CM | POA: Diagnosis not present

## 2019-05-16 ENCOUNTER — Ambulatory Visit
Admission: RE | Admit: 2019-05-16 | Discharge: 2019-05-16 | Disposition: A | Discharge status: Home / Self Care | Payer: Medicare Other | Source: Ambulatory Visit | Attending: Internal Medicine | Admitting: Internal Medicine

## 2019-05-16 ENCOUNTER — Other Ambulatory Visit: Payer: Self-pay | Admitting: Internal Medicine

## 2019-05-16 DIAGNOSIS — R194 Change in bowel habit: Secondary | ICD-10-CM | POA: Diagnosis not present

## 2019-05-16 DIAGNOSIS — K449 Diaphragmatic hernia without obstruction or gangrene: Secondary | ICD-10-CM | POA: Diagnosis not present

## 2019-05-16 DIAGNOSIS — R110 Nausea: Secondary | ICD-10-CM

## 2019-05-16 DIAGNOSIS — K5730 Diverticulosis of large intestine without perforation or abscess without bleeding: Secondary | ICD-10-CM | POA: Diagnosis not present

## 2019-05-16 DIAGNOSIS — R1030 Lower abdominal pain, unspecified: Secondary | ICD-10-CM

## 2019-05-16 DIAGNOSIS — R198 Other specified symptoms and signs involving the digestive system and abdomen: Secondary | ICD-10-CM

## 2019-05-16 DIAGNOSIS — K219 Gastro-esophageal reflux disease without esophagitis: Secondary | ICD-10-CM | POA: Diagnosis not present

## 2019-05-16 MED ORDER — IOPAMIDOL (ISOVUE-300) INJECTION 61%
100.0000 mL | Freq: Once | INTRAVENOUS | Status: AC | PRN
  Administered 2019-05-16: 100 mL via INTRAVENOUS

## 2019-05-28 DIAGNOSIS — F411 Generalized anxiety disorder: Secondary | ICD-10-CM | POA: Diagnosis not present

## 2019-05-28 DIAGNOSIS — F331 Major depressive disorder, recurrent, moderate: Secondary | ICD-10-CM | POA: Diagnosis not present

## 2019-05-29 DIAGNOSIS — Z23 Encounter for immunization: Secondary | ICD-10-CM | POA: Diagnosis not present

## 2019-06-06 DIAGNOSIS — G4733 Obstructive sleep apnea (adult) (pediatric): Secondary | ICD-10-CM | POA: Diagnosis not present

## 2019-06-06 DIAGNOSIS — G4710 Hypersomnia, unspecified: Secondary | ICD-10-CM | POA: Diagnosis not present

## 2019-06-06 DIAGNOSIS — R0600 Dyspnea, unspecified: Secondary | ICD-10-CM | POA: Diagnosis not present

## 2019-06-10 ENCOUNTER — Ambulatory Visit: Payer: Medicare Other

## 2019-06-12 ENCOUNTER — Ambulatory Visit: Payer: Medicare Other

## 2019-06-16 ENCOUNTER — Ambulatory Visit: Payer: Medicare Other | Admitting: Physical Therapy

## 2019-06-23 ENCOUNTER — Ambulatory Visit: Payer: Medicare Other | Attending: Registered Nurse

## 2019-06-23 ENCOUNTER — Other Ambulatory Visit: Payer: Self-pay

## 2019-06-23 DIAGNOSIS — M545 Low back pain, unspecified: Secondary | ICD-10-CM

## 2019-06-23 DIAGNOSIS — R293 Abnormal posture: Secondary | ICD-10-CM | POA: Diagnosis not present

## 2019-06-23 DIAGNOSIS — M6281 Muscle weakness (generalized): Secondary | ICD-10-CM | POA: Diagnosis not present

## 2019-06-23 NOTE — Therapy (Signed)
[facility] [address], [place], [place], [address] Phone: [phone]   Fax:  [phone]  Physical Therapy Evaluation  Patient Details  Name: [name] [name] MRN: [id number] Date of Birth: Jul 28, 1941 Referring Provider (PT): Toy Baker, NP   Encounter Date: 06/23/2019  PT End of Session - 06/23/19 1714    Visit Number  1    Date for PT Re-Evaluation  08/18/19    PT Start Time  6606    PT Stop Time  1616    PT Time Calculation (min)  43 min       Past Medical History:  Diagnosis Date  . A-fib ([place])    resolved  . Anxiety   . CHF (congestive heart failure) ([place])    resolved  . Depression   . DVT (deep venous thrombosis) ([place])   . GERD (gastroesophageal reflux disease)   . Heart transplanted [facility])    Followed at [facility] - Dr. Shon Hale  . HTN (hypertension)   . Hyperparathyroidism ([place])    resolved  . Osteoporosis   . Pacemaker    resolved    Past Surgical History:  Procedure Laterality Date  . ABDOMINAL HYSTERECTOMY    . ABLATION  2007  . ABLATION ON ENDOMETRIOSIS    . APPENDECTOMY    . BREAST EXCISIONAL BIOPSY Left 1986   benign  . CARDIAC CATHETERIZATION  2009  . HEART TRANSPLANT  2009  . HEART TRANSPLANT    . NECK SURGERY     removal of parathyroid  . PACEMAKER INSERTION  2005  . TOTAL ABDOMINAL HYSTERECTOMY W/ BILATERAL SALPINGOOPHORECTOMY      There were no vitals filed for this visit.   Subjective Assessment - 06/23/19 1547    Subjective  Pt presents to PT with endurance and strength deficits.  Pt reports that she has had a lot of incactivity dur to illness and quarantine during covid.  Pt also reports LBP with standing and walking.    Pertinent History  heart transplant    How long can you stand comfortably?  30 minutes    How long can you walk comfortably?  30 minutes    Patient Stated Goals  improve endurance, reduce LBP    Currently in Pain?  Yes    Pain  Score  0-No pain   up to 8/10   Pain Location  Back    Pain Orientation  Right;Left;Lower    Pain Descriptors / Indicators  Aching;Sore    Pain Type  Chronic pain    Pain Frequency  Intermittent    Aggravating Factors   standing/walking, carrying heavy objects    Pain Relieving Factors  rest, sitting         [facility] PT Assessment - 06/23/19 0001      Assessment   Medical Diagnosis  deconditioning, fatigue, osteoporosis, lumbago    Referring Provider (PT)  Toy Baker, NP    Onset Date/Surgical Date  06/22/17   chronic    Prior Therapy  none recently      Precautions   Precautions  Other (comment)   osteoporosis     Restrictions   Weight Bearing Restrictions  No      Balance Screen   Has the patient fallen in the past 6 months  No    Has the patient had a decrease in activity level because of a fear of falling?   No    Is the patient reluctant to leave their home  because of a fear of falling?   No      Home Public house manager residence    Living Arrangements  Spouse/significant other    Home Access  Stairs to enter    Home Layout  Two level    Additional Comments  husband has Parkinsons disease      Prior Function   Level of Independence  Independent    Vocation  Retired      IT consultant   Overall Cognitive Status  Within Functional Limits for tasks assessed      Posture/Postural Control   Posture/Postural Control  Postural limitations    Postural Limitations  Forward head;Rounded Shoulders      ROM / Strength   AROM / PROM / Strength  AROM;Strength      AROM   Overall AROM   Deficits    Overall AROM Comments  lumbar A/ROM and hip flexibility reduced by 25% without pain at end range.      Strength   Overall Strength  Within functional limits for tasks performed    Overall Strength Comments  4+/5 UE strength to 4/5 LE strength      Palpation   Palpation comment  Pt with palpable tenderness over bil lumbar paraspinals and  reduced PA mobility in the lumbar segments.      Transfers   Transfers  Stand to Sit;Sit to Stand    Sit to Stand  With upper extremity assist    Five time sit to stand comments   18 seconds      Ambulation/Gait   Ambulation/Gait  Yes    Ambulation/Gait Assistance  6: Modified independent (Device/Increase time)    Gait Pattern  Step-through pattern;Decreased stride length;Decreased trunk rotation    Ambulation Surface  Level    Stairs  Yes    Stairs Assistance  6: Modified independent (Device/Increase time)    Stair Management Technique  Two rails;Alternating pattern                Objective measurements completed on examination: See above findings.              PT Education - 06/23/19 1613    Education Details  Access Code: [email or web address]    Person(s) Educated  Patient    Methods  Explanation;Demonstration;Handout    Comprehension  Verbalized understanding;Returned demonstration       PT Short Term Goals - 06/23/19 1541      PT SHORT TERM GOAL #1   Title  be independent in initial HEP    Time  4    Period  Weeks    Status  New    Target Date  07/21/19      PT SHORT TERM GOAL #2   Title  report a 25% reduction in LBP with standing and walking    Time  4    Period  Weeks    Status  New    Target Date  07/21/19      PT SHORT TERM GOAL #3   Title  verbalize and demonstrate postural corrections for neutral spine with sitting and ADLs    Time  4    Period  Weeks    Status  New    Target Date  07/21/19      PT SHORT TERM GOAL #4   Title  perform 5x sit to stand in < or = to 16 seconds to improve balance    Time  4  Period  Weeks    Status  New    Target Date  07/21/19      PT SHORT TERM GOAL #5   Title  ride recumbent bike at home > or = to 5x/wk for endurance gains    Time  4    Period  Weeks    Status  New    Target Date  07/21/19        PT Long Term Goals - 06/23/19 1542      PT LONG TERM GOAL #1   Title  be independent in advanced  HEP    Time  8    Period  Weeks    Status  New    Target Date  08/18/19      PT LONG TERM GOAL #2   Title  report 50% less fatigue with walking for 30 minutes to improve endurance for community function    Time  8    Period  Weeks    Status  New    Target Date  08/18/19      PT LONG TERM GOAL #3   Title  report 60% reduction in LBP with ADLs and houswork    Time  8    Period  Weeks    Status  New    Target Date  08/18/19      PT LONG TERM GOAL #4   Title  perform 5x sit to stand in < or = to 14 seconds to improve balance    Time  8    Period  Weeks    Status  New    Target Date  08/18/19      PT LONG TERM GOAL #5   Title  improve LE strength to negotiate steps with use of 1 rail    Time  8    Period  Weeks    Status  New    Target Date  08/18/19             Plan - 06/23/19 1703    Clinical Impression Statement  Pt presents to PT with complaints of LBP and chronic endurance and strength deficits.  Pt had a heart transplant in 2009.  Pt reports up to 8/10 LBP with standing and walking.  Pt has had illness and immobility over the past months and has lost strength and endurance.  Pt performed 5x sit to stand in 18 seconds indicating a falls risk for her age. Pt demonstrates reduced LE and UE strength globally.  Pt demonstrates mild shortness of breath with sit to stand x 5.  Pt is limited to standing for 30 minutes due to LBP and endurance deficits.  Pt will benefit from skilled PT to address strength and endurance and to address LBP with standing tasks.    Personal Factors and Comorbidities  Comorbidity 2    Comorbidities  osteoporosis, chronic LBP, history of heart replacement, husband has Parkinsons    Examination-Activity Limitations  Locomotion Level;Stairs;Stand    Examination-Participation Restrictions  Church;Cleaning;Community Activity;Shop;Meal Prep    Stability/Clinical Decision Making  Evolving/Moderate complexity    Clinical Decision Making  Moderate     Rehab Potential  Good    PT Frequency  2x / week    PT Duration  8 weeks    PT Treatment/Interventions  ADLs/Self Care Home Management;Cryotherapy;Moist Heat;Electrical Stimulation;Gait training;Stair training;Functional mobility training;Therapeutic activities;Therapeutic exercise;Balance training;Patient/family education;Manual techniques;Passive range of motion;Dry needling;Taping    PT Next Visit Plan  work on sit to stand,  LE strength, NuStep, flexibility for LBP    PT Home Exercise Plan  Access Code: [email or web address]    Consulted and Agree with Plan of Care  Patient       Patient will benefit from skilled therapeutic intervention in order to improve the following deficits and impairments:  Decreased activity tolerance, Decreased mobility, Decreased endurance, Decreased range of motion, Decreased strength, Postural dysfunction, Impaired flexibility, Pain, Increased muscle spasms  Visit Diagnosis: Abnormal posture - Plan: PT plan of care cert/re-cert  Muscle weakness (generalized) - Plan: PT plan of care cert/re-cert  Acute bilateral low back pain without sciatica - Plan: PT plan of care cert/re-cert     Problem List Patient Active Problem List   Diagnosis Date Noted  . Colitis 07/18/2016  . Depression   . Heart transplanted (HCC)   . HTN (hypertension)      Lorrene ReidKelly Takacs, PT 06/23/19 5:16 PM  [facility] [address] [place], [place], [address] Phone: [phone]   Fax:  [phone]  Name: Lenn CalMaxine Pierce Courser MRN: [id number] Date of Birth: February 09, 1941

## 2019-06-23 NOTE — Patient Instructions (Signed)
Access Code: [email or web address]  URL: [email or web address]  Date: 06/23/2019  Prepared by: Sigurd Sos   Exercises Seated Long Arc Quad - 10 reps - 2 sets - 5 hold - 3x daily - 7x weekly Seated Hamstring Stretch - 3 reps - 20 hold - 3x daily - 7x weekly Sit to Stand - 5 reps - 3 sets - 2x daily - 7x weekly Recumbent Bike - 7x weekly

## 2019-06-25 ENCOUNTER — Other Ambulatory Visit: Payer: Self-pay

## 2019-06-25 ENCOUNTER — Ambulatory Visit: Payer: Medicare Other

## 2019-06-25 DIAGNOSIS — M545 Low back pain, unspecified: Secondary | ICD-10-CM

## 2019-06-25 DIAGNOSIS — R293 Abnormal posture: Secondary | ICD-10-CM | POA: Diagnosis not present

## 2019-06-25 DIAGNOSIS — M6281 Muscle weakness (generalized): Secondary | ICD-10-CM | POA: Diagnosis not present

## 2019-06-25 NOTE — Therapy (Signed)
[facility] [address] [place], [place], [address] Phone: [phone]   Fax:  [phone]  Physical Therapy Treatment  Patient Details  Name: [name] [name] MRN: [id number] Date of Birth: 02-Oct-1940 Referring Provider (PT): Lydia Guiles, NP   Encounter Date: 06/25/2019  PT End of Session - 06/25/19 1656    Visit Number  2    Date for PT Re-Evaluation  08/18/19    PT Start Time  1617    PT Stop Time  1659    PT Time Calculation (min)  42 min    Activity Tolerance  Patient tolerated treatment well    Behavior During Therapy  [facility] for tasks assessed/performed       Past Medical History:  Diagnosis Date  . A-fib (HCC)    resolved  . Anxiety   . CHF (congestive heart failure) (HCC)    resolved  . Depression   . DVT (deep venous thrombosis) (HCC)   . GERD (gastroesophageal reflux disease)   . Heart transplanted [facility])    Followed at [facility] - Dr. Laurence Compton  . HTN (hypertension)   . Hyperparathyroidism (HCC)    resolved  . Osteoporosis   . Pacemaker    resolved    Past Surgical History:  Procedure Laterality Date  . ABDOMINAL HYSTERECTOMY    . ABLATION  2007  . ABLATION ON ENDOMETRIOSIS    . APPENDECTOMY    . BREAST EXCISIONAL BIOPSY Left 1986   benign  . CARDIAC CATHETERIZATION  2009  . HEART TRANSPLANT  2009  . HEART TRANSPLANT    . NECK SURGERY     removal of parathyroid  . PACEMAKER INSERTION  2005  . TOTAL ABDOMINAL HYSTERECTOMY W/ BILATERAL SALPINGOOPHORECTOMY      There were no vitals filed for this visit.  Subjective Assessment - 06/25/19 1626    Subjective  I haven't been good about doing my exercises.  I moved a lot of stuff because we are getting granite.    Patient Stated Goals  improve endurance, reduce LBP                       [facility] Adult PT Treatment/Exercise - 06/25/19 0001      Exercises   Exercises  Knee/Hip;Lumbar;Shoulder      Lumbar  Exercises: Stretches   Active Hamstring Stretch  3 reps;20 seconds;Right;Left      Knee/Hip Exercises: Seated   Long Arc Quad  Strengthening;Both;2 sets;10 reps    Sit to [employer]  2 sets;10 reps;without UE support      Shoulder Exercises: Seated   Flexion  Strengthening;Both;20 reps    Flexion Weight (lbs)  1    Abduction  Strengthening;Both;20 reps    ABduction Weight (lbs)  1      Shoulder Exercises: ROM/Strengthening   UBE (Upper Arm Bike)  Level 1x 4 minutes (2/2)               PT Short Term Goals - 06/23/19 1541      PT SHORT TERM GOAL #1   Title  be independent in initial HEP    Time  4    Period  Weeks    Status  New    Target Date  07/21/19      PT SHORT TERM GOAL #2   Title  report a 25% reduction in LBP with standing and walking    Time  4    Period  Weeks    Status  New    Target Date  07/21/19      PT SHORT TERM GOAL #3   Title  verbalize and demonstrate postural corrections for neutral spine with sitting and ADLs    Time  4    Period  Weeks    Status  New    Target Date  07/21/19      PT SHORT TERM GOAL #4   Title  perform 5x sit to stand in < or = to 16 seconds to improve balance    Time  4    Period  Weeks    Status  New    Target Date  07/21/19      PT SHORT TERM GOAL #5   Title  ride recumbent bike at home > or = to 5x/wk for endurance gains    Time  4    Period  Weeks    Status  New    Target Date  07/21/19        PT Long Term Goals - 06/23/19 1542      PT LONG TERM GOAL #1   Title  be independent in advanced HEP    Time  8    Period  Weeks    Status  New    Target Date  08/18/19      PT LONG TERM GOAL #2   Title  report 50% less fatigue with walking for 30 minutes to improve endurance for community function    Time  8    Period  Weeks    Status  New    Target Date  08/18/19      PT LONG TERM GOAL #3   Title  report 60% reduction in LBP with ADLs and houswork    Time  8    Period  Weeks    Status  New    Target Date   08/18/19      PT LONG TERM GOAL #4   Title  perform 5x sit to stand in < or = to 14 seconds to improve balance    Time  8    Period  Weeks    Status  New    Target Date  08/18/19      PT LONG TERM GOAL #5   Title  improve LE strength to negotiate steps with use of 1 rail    Time  8    Period  Weeks    Status  New    Target Date  08/18/19            Plan - 06/25/19 1639    Clinical Impression Statement  Pt with first time follow-up after evaluation.  Pt has not been compliant with HEP and will work to become more consistent between now and next session.  Pt tolerated UE and LE exercise well with some fatigue.  PT monitored pt for safety, technique and fatigue throughout session.  Pt will continue to benefit from skilled PT for strength, endurance and flexibility to address limited strength and endurance.    PT Treatment/Interventions  ADLs/Self Care Home Management;Cryotherapy;Moist Heat;Electrical Stimulation;Gait training;Stair training;Functional mobility training;Therapeutic activities;Therapeutic exercise;Balance training;Patient/family education;Manual techniques;Passive range of motion;Dry needling;Taping    PT Next Visit Plan  work on sit to stand, LE strength, NuStep, flexibility for LBP, UE strength    PT Home Exercise Plan  Access Code: [email or web address]    Consulted and Agree with Plan of Care  Patient  Patient will benefit from skilled therapeutic intervention in order to improve the following deficits and impairments:  Decreased activity tolerance, Decreased mobility, Decreased endurance, Decreased range of motion, Decreased strength, Postural dysfunction, Impaired flexibility, Pain, Increased muscle spasms  Visit Diagnosis: Abnormal posture  Muscle weakness (generalized)  Acute bilateral low back pain without sciatica     Problem List Patient Active Problem List   Diagnosis Date Noted  . Colitis 07/18/2016  . Depression   . Heart transplanted (HCC)   . HTN  (hypertension)    Lorrene ReidKelly Joevon Holliman, PT 06/25/19 5:01 PM   [facility] [address] [place], [place], [address] Phone: [phone]   Fax:  [phone]  Name: Lenn CalMaxine Pierce Pautsch MRN: [id number] Date of Birth: 10-01-1940

## 2019-07-03 ENCOUNTER — Ambulatory Visit: Payer: Medicare Other | Attending: Registered Nurse | Admitting: Physical Therapy

## 2019-07-03 ENCOUNTER — Other Ambulatory Visit: Payer: Self-pay

## 2019-07-03 ENCOUNTER — Encounter: Payer: Self-pay | Admitting: Physical Therapy

## 2019-07-03 DIAGNOSIS — M545 Low back pain, unspecified: Secondary | ICD-10-CM

## 2019-07-03 DIAGNOSIS — R252 Cramp and spasm: Secondary | ICD-10-CM

## 2019-07-03 DIAGNOSIS — R293 Abnormal posture: Secondary | ICD-10-CM

## 2019-07-03 DIAGNOSIS — M6281 Muscle weakness (generalized): Secondary | ICD-10-CM | POA: Insufficient documentation

## 2019-07-03 DIAGNOSIS — M546 Pain in thoracic spine: Secondary | ICD-10-CM

## 2019-07-03 NOTE — Therapy (Signed)
[facility] [address] [place], [place], [address] Phone: [phone]   Fax:  [phone]  Physical Therapy Treatment  Patient Details  Name: [name] [name] MRN: [id number] Date of Birth: October 05, 1940 Referring Provider (PT): Lydia Guiles, NP   Encounter Date: 07/03/2019  PT End of Session - 07/03/19 1625    Visit Number  3    Date for PT Re-Evaluation  08/18/19    PT Start Time  1620    PT Stop Time  1705    PT Time Calculation (min)  45 min    Activity Tolerance  Patient tolerated treatment well    Behavior During Therapy  [facility] for tasks assessed/performed       Past Medical History:  Diagnosis Date  . A-fib (HCC)    resolved  . Anxiety   . CHF (congestive heart failure) (HCC)    resolved  . Depression   . DVT (deep venous thrombosis) (HCC)   . GERD (gastroesophageal reflux disease)   . Heart transplanted [facility])    Followed at [facility] - Dr. Laurence Compton  . HTN (hypertension)   . Hyperparathyroidism (HCC)    resolved  . Osteoporosis   . Pacemaker    resolved    Past Surgical History:  Procedure Laterality Date  . ABDOMINAL HYSTERECTOMY    . ABLATION  2007  . ABLATION ON ENDOMETRIOSIS    . APPENDECTOMY    . BREAST EXCISIONAL BIOPSY Left 1986   benign  . CARDIAC CATHETERIZATION  2009  . HEART TRANSPLANT  2009  . HEART TRANSPLANT    . NECK SURGERY     removal of parathyroid  . PACEMAKER INSERTION  2005  . TOTAL ABDOMINAL HYSTERECTOMY W/ BILATERAL SALPINGOOPHORECTOMY      There were no vitals filed for this visit.  Subjective Assessment - 07/03/19 1623    Subjective  I had a big birthday since I was last here so I've only been partially successful in doing my HEP since last here. The exercises are fine.    Pertinent History  heart transplant    How long can you stand comfortably?  30 minutes    How long can you walk comfortably?  30 minutes    Patient Stated Goals  improve  endurance, reduce LBP    Currently in Pain?  No/denies                       [facility] Adult PT Treatment/Exercise - 07/03/19 0001      Exercises   Exercises  Lumbar;Knee/Hip      Lumbar Exercises: Aerobic   Nustep  L2 seat 7 arms 10, PT present to discuss progress      Lumbar Exercises: Standing   Heel Raises  15 reps    Heel Raises Limitations  with toe raises    Other Standing Lumbar Exercises  marching toe taps edge of TM bil UE support x 20, PT cued stand tall through hips      Lumbar Exercises: Seated   Long Arc Quad on Chair  Strengthening;AROM;Both;20 reps      Knee/Hip Exercises: Seated   Ball Squeeze  10x5 sec with VCs for TrA co-contraction      Shoulder Exercises: Seated   Flexion  Strengthening;Both;20 reps    Flexion Weight (lbs)  1    Abduction  Strengthening;Both;20 reps    ABduction Weight (lbs)  1      Modalities   Modalities  Moist Heat      Moist Heat Therapy   Number Minutes Moist Heat  10 Minutes    Moist Heat Location  Lumbar Spine               PT Short Term Goals - 07/03/19 1625      PT SHORT TERM GOAL #1   Title  be independent in initial HEP    Baseline  working on compliance    Status  On-going      PT SHORT TERM GOAL #2   Title  report a 25% reduction in LBP with standing and walking    Status  On-going        PT Long Term Goals - 06/23/19 1542      PT LONG TERM GOAL #1   Title  be independent in advanced HEP    Time  8    Period  Weeks    Status  New    Target Date  08/18/19      PT LONG TERM GOAL #2   Title  report 50% less fatigue with walking for 30 minutes to improve endurance for community function    Time  8    Period  Weeks    Status  New    Target Date  08/18/19      PT LONG TERM GOAL #3   Title  report 60% reduction in LBP with ADLs and houswork    Time  8    Period  Weeks    Status  New    Target Date  08/18/19      PT LONG TERM GOAL #4   Title  perform 5x sit to stand in < or = to  14 seconds to improve balance    Time  8    Period  Weeks    Status  New    Target Date  08/18/19      PT LONG TERM GOAL #5   Title  improve LE strength to negotiate steps with use of 1 rail    Time  8    Period  Weeks    Status  New    Target Date  08/18/19            Plan - 07/03/19 1653    Clinical Impression Statement  Pt arrived without pain but did report increased pain with exercise today.  She tolerated 35 min of ther ex before onset of pain, reporting pain up to 6-7/10 end of session.  PT applied 10 min of lumbar heat end of session to help with pain.  Pt admits trouble with compliance with HEP and declined any additions to HEP so she does not feel overwhelmed.  She reported improved awareness of TrA with seated ball squeeze today. PT cued glute med in standing marching toe taps which improved recruitment somewhat.  Pt will benefit from careful progression of stabilization and strength with slower pace due to back pain onset with ther ex today.    Comorbidities  osteoporosis, chronic LBP, history of heart replacement, husband has Parkinsons    Rehab Potential  Good    PT Frequency  2x / week    PT Duration  8 weeks    PT Treatment/Interventions  ADLs/Self Care Home Management;Cryotherapy;Moist Heat;Electrical Stimulation;Gait training;Stair training;Functional mobility training;Therapeutic activities;Therapeutic exercise;Balance training;Patient/family education;Manual techniques;Passive range of motion;Dry needling;Taping    PT Next Visit Plan  work on sit to stand, LE strength, NuStep, flexibility for LBP, UE strength, monitor  pain with ther ex, progress HEP when Pt ready to add more    PT Home Exercise Plan  Access Code: [email or web address]    Consulted and Agree with Plan of Care  Patient       Patient will benefit from skilled therapeutic intervention in order to improve the following deficits and impairments:     Visit Diagnosis: Abnormal posture  Muscle weakness  (generalized)  Acute bilateral low back pain without sciatica  Pain in thoracic spine  Cramp and spasm     Problem List Patient Active Problem List   Diagnosis Date Noted  . Colitis 07/18/2016  . Depression   . Heart transplanted ([employer])   . HTN (hypertension)     Baruch Merl, PT 07/03/19 4:59 PM   [facility] [address], [place], [place], [address] Phone: [phone]   Fax:  [phone]  Name: [name] [name] MRN: [id number] Date of Birth: 09/01/1940

## 2019-07-04 DIAGNOSIS — Z79899 Other long term (current) drug therapy: Secondary | ICD-10-CM | POA: Diagnosis not present

## 2019-07-04 DIAGNOSIS — Z941 Heart transplant status: Secondary | ICD-10-CM | POA: Diagnosis not present

## 2019-07-07 ENCOUNTER — Encounter: Payer: Medicare Other | Admitting: Physical Therapy

## 2019-07-09 ENCOUNTER — Ambulatory Visit: Payer: Medicare Other | Admitting: Physical Therapy

## 2019-07-10 ENCOUNTER — Encounter: Payer: Medicare Other | Admitting: Physical Therapy

## 2019-07-14 ENCOUNTER — Ambulatory Visit: Payer: Medicare Other

## 2019-07-14 ENCOUNTER — Other Ambulatory Visit: Payer: Self-pay

## 2019-07-14 DIAGNOSIS — M6281 Muscle weakness (generalized): Secondary | ICD-10-CM

## 2019-07-14 DIAGNOSIS — M545 Low back pain, unspecified: Secondary | ICD-10-CM

## 2019-07-14 DIAGNOSIS — R252 Cramp and spasm: Secondary | ICD-10-CM | POA: Diagnosis not present

## 2019-07-14 DIAGNOSIS — R293 Abnormal posture: Secondary | ICD-10-CM

## 2019-07-14 DIAGNOSIS — M546 Pain in thoracic spine: Secondary | ICD-10-CM | POA: Diagnosis not present

## 2019-07-14 NOTE — Therapy (Addendum)
[facility] [address] [place], [place], [address] Phone: [phone]   Fax:  [phone]  Physical Therapy Treatment  Patient Details  Name: [name] [name] MRN: [id number] Date of Birth: 1941-04-19 Referring Provider (PT): Toy Baker, NP   Encounter Date: 07/14/2019  PT End of Session - 07/14/19 1436    Visit Number  4    Date for PT Re-Evaluation  08/18/19    PT Start Time  1404    PT Stop Time  1445    PT Time Calculation (min)  41 min    Activity Tolerance  Patient tolerated treatment well    Behavior During Therapy  [facility] for tasks assessed/performed       Past Medical History:  Diagnosis Date  . A-fib ([place])    resolved  . Anxiety   . CHF (congestive heart failure) ([place])    resolved  . Depression   . DVT (deep venous thrombosis) ([place])   . GERD (gastroesophageal reflux disease)   . Heart transplanted [facility])    Followed at [facility] - Dr. Shon Hale  . HTN (hypertension)   . Hyperparathyroidism ([place])    resolved  . Osteoporosis   . Pacemaker    resolved    Past Surgical History:  Procedure Laterality Date  . ABDOMINAL HYSTERECTOMY    . ABLATION  2007  . ABLATION ON ENDOMETRIOSIS    . APPENDECTOMY    . BREAST EXCISIONAL BIOPSY Left 1986   benign  . CARDIAC CATHETERIZATION  2009  . HEART TRANSPLANT  2009  . HEART TRANSPLANT    . NECK SURGERY     removal of parathyroid  . PACEMAKER INSERTION  2005  . TOTAL ABDOMINAL HYSTERECTOMY W/ BILATERAL SALPINGOOPHORECTOMY      There were no vitals filed for this visit.  Subjective Assessment - 07/14/19 1407    Subjective  I was sick all last week with stomach bug.  I am starting all over again.    Currently in Pain?  No/denies                       [facility] Adult PT Treatment/Exercise - 07/14/19 0001      Lumbar Exercises: Aerobic   Nustep  L2 seat 7 arms x 10, PT present to discuss progress      Lumbar  Exercises: Standing   Heel Raises  20 reps    Other Standing Lumbar Exercises  marching toe taps edge of 6" step with UE support x 20, PT cued stand tall through hips      Lumbar Exercises: Seated   Long Arc Quad on Chair  Strengthening;AROM;Both;20 reps      Knee/Hip Exercises: Seated   Ball Squeeze  10x5 sec with VCs for TrA co-contraction      Shoulder Exercises: Seated   Flexion  Strengthening;Both;20 reps    Flexion Weight (lbs)  1    Abduction  Strengthening;Both;20 reps    ABduction Weight (lbs)  1               PT Short Term Goals - 07/03/19 1625      PT SHORT TERM GOAL #1   Title  be independent in initial HEP    Baseline  working on compliance    Status  On-going      PT SHORT TERM GOAL #2   Title  report a 25% reduction in LBP with standing and walking    Status  On-going        PT Long Term Goals - 06/23/19 1542      PT LONG TERM GOAL #1   Title  be independent in advanced HEP    Time  8    Period  Weeks    Status  New    Target Date  08/18/19      PT LONG TERM GOAL #2   Title  report 50% less fatigue with walking for 30 minutes to improve endurance for community function    Time  8    Period  Weeks    Status  New    Target Date  08/18/19      PT LONG TERM GOAL #3   Title  report 60% reduction in LBP with ADLs and houswork    Time  8    Period  Weeks    Status  New    Target Date  08/18/19      PT LONG TERM GOAL #4   Title  perform 5x sit to stand in < or = to 14 seconds to improve balance    Time  8    Period  Weeks    Status  New    Target Date  08/18/19      PT LONG TERM GOAL #5   Title  improve LE strength to negotiate steps with use of 1 rail    Time  8    Period  Weeks    Status  New    Target Date  08/18/19            Plan - 07/14/19 1421    Clinical Impression Statement  Pt with lapse in treatment due to illness.  Pt was in bed and was not able to be consistent with HEP for strength.  Pt lost strength and  endurance with illness.  Pt participated in gentle strength exercises and PT was present to monitor for fatigue and symptoms.  Pt with chronic strength and endurance deficits and will continue to benefit from skilled PT for strength, endurance and balance training.    Rehab Potential  Good    PT Frequency  2x / week    PT Duration  8 weeks    PT Treatment/Interventions  ADLs/Self Care Home Management;Cryotherapy;Moist Heat;Electrical Stimulation;Gait training;Stair training;Functional mobility training;Therapeutic activities;Therapeutic exercise;Balance training;Patient/family education;Manual techniques;Passive range of motion;Dry needling;Taping    PT Next Visit Plan  advance strength and endurance exercises    PT Home Exercise Plan  Access Code: [email or web address]    Recommended Other Services  initial cert is signed    Consulted and Agree with Plan of Care  Patient       Patient will benefit from skilled therapeutic intervention in order to improve the following deficits and impairments:  Decreased activity tolerance, Decreased mobility, Decreased endurance, Decreased range of motion, Decreased strength, Postural dysfunction, Impaired flexibility, Pain, Increased muscle spasms  Visit Diagnosis: Abnormal posture  Muscle weakness (generalized)  Acute bilateral low back pain without sciatica     Problem List Patient Active Problem List   Diagnosis Date Noted  . Colitis 07/18/2016  . Depression   . Heart transplanted ([place])   . HTN (hypertension)      Sigurd Sos, PT 07/14/19 2:44 PM   PHYSICAL THERAPY DISCHARGE SUMMARY  Visits from Start of Care: 4  Current functional level related to goals / functional outcomes: See above   Remaining deficits: See above   Education / Equipment: HEP Plan: Patient agrees  to discharge.  Patient goals were not met. Patient is being discharged due to not returning since the last visit.  ?????         Baruch Merl, PT 09/05/19 10:39  AM   [facility] [address], [place], [place], [address] Phone: [phone]   Fax:  [phone]  Name: [name] [name] MRN: [id number] Date of Birth: [birth year]

## 2019-07-16 ENCOUNTER — Ambulatory Visit: Payer: Medicare Other

## 2019-07-21 ENCOUNTER — Ambulatory Visit: Payer: Medicare Other

## 2019-07-23 DIAGNOSIS — J069 Acute upper respiratory infection, unspecified: Secondary | ICD-10-CM | POA: Diagnosis not present

## 2019-07-23 DIAGNOSIS — Z20818 Contact with and (suspected) exposure to other bacterial communicable diseases: Secondary | ICD-10-CM | POA: Diagnosis not present

## 2019-07-23 DIAGNOSIS — Z941 Heart transplant status: Secondary | ICD-10-CM | POA: Diagnosis not present

## 2019-07-23 DIAGNOSIS — R05 Cough: Secondary | ICD-10-CM | POA: Diagnosis not present

## 2019-07-30 DIAGNOSIS — R7301 Impaired fasting glucose: Secondary | ICD-10-CM | POA: Diagnosis not present

## 2019-07-30 DIAGNOSIS — E7849 Other hyperlipidemia: Secondary | ICD-10-CM | POA: Diagnosis not present

## 2019-07-30 DIAGNOSIS — M810 Age-related osteoporosis without current pathological fracture: Secondary | ICD-10-CM | POA: Diagnosis not present

## 2019-07-30 DIAGNOSIS — E038 Other specified hypothyroidism: Secondary | ICD-10-CM | POA: Diagnosis not present

## 2019-07-31 DIAGNOSIS — R7301 Impaired fasting glucose: Secondary | ICD-10-CM | POA: Diagnosis not present

## 2019-07-31 DIAGNOSIS — Z0000 Encounter for general adult medical examination without abnormal findings: Secondary | ICD-10-CM | POA: Diagnosis not present

## 2019-07-31 DIAGNOSIS — E785 Hyperlipidemia, unspecified: Secondary | ICD-10-CM | POA: Diagnosis not present

## 2019-07-31 DIAGNOSIS — Z1331 Encounter for screening for depression: Secondary | ICD-10-CM | POA: Diagnosis not present

## 2019-07-31 DIAGNOSIS — I10 Essential (primary) hypertension: Secondary | ICD-10-CM | POA: Diagnosis not present

## 2019-08-01 DIAGNOSIS — E669 Obesity, unspecified: Secondary | ICD-10-CM | POA: Diagnosis not present

## 2019-08-01 DIAGNOSIS — R7301 Impaired fasting glucose: Secondary | ICD-10-CM | POA: Diagnosis not present

## 2019-08-19 DIAGNOSIS — I10 Essential (primary) hypertension: Secondary | ICD-10-CM | POA: Diagnosis not present

## 2019-08-19 DIAGNOSIS — Z941 Heart transplant status: Secondary | ICD-10-CM | POA: Diagnosis not present

## 2019-08-19 DIAGNOSIS — Z8679 Personal history of other diseases of the circulatory system: Secondary | ICD-10-CM | POA: Diagnosis not present

## 2019-08-19 DIAGNOSIS — R5383 Other fatigue: Secondary | ICD-10-CM | POA: Diagnosis not present

## 2019-09-19 ENCOUNTER — Ambulatory Visit: Payer: Medicare Other | Attending: Internal Medicine

## 2019-09-19 DIAGNOSIS — Z23 Encounter for immunization: Secondary | ICD-10-CM | POA: Insufficient documentation

## 2019-09-19 NOTE — Progress Notes (Signed)
   Covid-19 Vaccination Clinic  Name:  [name] [name]    MRN: [id number] DOB: February 02, 1941  09/19/2019  [name] was observed post Covid-19 immunization for 30 minutes based on pre-vaccination screening without incidence. She was provided with Vaccine Information Sheet and instruction to access the V-Safe system.   [name] was instructed to call 911 with any severe reactions post vaccine: Marland Kitchen Difficulty breathing  . Swelling of your face and throat  . A fast heartbeat  . A bad rash all over your body  . Dizziness and weakness    Immunizations Administered    Name Date Dose VIS Date Route   Pfizer COVID-19 Vaccine 09/19/2019 11:49 AM 0.3 mL 08/08/2019 Intramuscular   Manufacturer: [employer], [employer]   Lot: [id number]   NDC: [address]-5

## 2019-10-10 ENCOUNTER — Ambulatory Visit: Payer: Medicare Other | Attending: Internal Medicine

## 2019-10-10 DIAGNOSIS — Z23 Encounter for immunization: Secondary | ICD-10-CM

## 2019-10-10 NOTE — Progress Notes (Signed)
   Covid-19 Vaccination Clinic  Name:  [name] [name]    MRN: [id number] DOB: 30-May-1941  10/10/2019  [name] was observed post Covid-19 immunization for 30 minutes based on pre-vaccination screening without incidence. She was provided with Vaccine Information Sheet and instruction to access the V-Safe system.   [name] was instructed to call 911 with any severe reactions post vaccine: Marland Kitchen Difficulty breathing  . Swelling of your face and throat  . A fast heartbeat  . A bad rash all over your body  . Dizziness and weakness    Immunizations Administered    Name Date Dose VIS Date Route   Pfizer COVID-19 Vaccine 10/10/2019 11:32 AM 0.3 mL 08/08/2019 Intramuscular   Manufacturer: [employer], [employer]   Lot: [id number]   NDC: [address]-7

## 2019-10-20 DIAGNOSIS — D045 Carcinoma in situ of skin of trunk: Secondary | ICD-10-CM | POA: Diagnosis not present

## 2019-10-20 DIAGNOSIS — Z85828 Personal history of other malignant neoplasm of skin: Secondary | ICD-10-CM | POA: Diagnosis not present

## 2019-10-20 DIAGNOSIS — C44529 Squamous cell carcinoma of skin of other part of trunk: Secondary | ICD-10-CM | POA: Diagnosis not present

## 2019-10-20 DIAGNOSIS — L821 Other seborrheic keratosis: Secondary | ICD-10-CM | POA: Diagnosis not present

## 2019-10-20 DIAGNOSIS — D225 Melanocytic nevi of trunk: Secondary | ICD-10-CM | POA: Diagnosis not present

## 2019-10-20 DIAGNOSIS — D485 Neoplasm of uncertain behavior of skin: Secondary | ICD-10-CM | POA: Diagnosis not present

## 2019-11-17 DIAGNOSIS — Z9889 Other specified postprocedural states: Secondary | ICD-10-CM | POA: Diagnosis not present

## 2019-11-17 DIAGNOSIS — Z7901 Long term (current) use of anticoagulants: Secondary | ICD-10-CM | POA: Diagnosis not present

## 2019-11-17 DIAGNOSIS — I82409 Acute embolism and thrombosis of unspecified deep veins of unspecified lower extremity: Secondary | ICD-10-CM | POA: Diagnosis not present

## 2019-11-17 DIAGNOSIS — I10 Essential (primary) hypertension: Secondary | ICD-10-CM | POA: Diagnosis not present

## 2019-11-17 DIAGNOSIS — Z79899 Other long term (current) drug therapy: Secondary | ICD-10-CM | POA: Diagnosis not present

## 2019-11-17 DIAGNOSIS — Z4821 Encounter for aftercare following heart transplant: Secondary | ICD-10-CM | POA: Diagnosis not present

## 2019-11-17 DIAGNOSIS — Z79890 Hormone replacement therapy (postmenopausal): Secondary | ICD-10-CM | POA: Diagnosis not present

## 2019-11-17 DIAGNOSIS — E039 Hypothyroidism, unspecified: Secondary | ICD-10-CM | POA: Diagnosis not present

## 2019-11-17 DIAGNOSIS — R911 Solitary pulmonary nodule: Secondary | ICD-10-CM | POA: Diagnosis not present

## 2019-11-18 DIAGNOSIS — Z941 Heart transplant status: Secondary | ICD-10-CM | POA: Diagnosis not present

## 2019-11-18 DIAGNOSIS — R6520 Severe sepsis without septic shock: Secondary | ICD-10-CM | POA: Diagnosis not present

## 2019-11-18 DIAGNOSIS — Z9089 Acquired absence of other organs: Secondary | ICD-10-CM | POA: Diagnosis not present

## 2019-11-18 DIAGNOSIS — R002 Palpitations: Secondary | ICD-10-CM | POA: Diagnosis not present

## 2019-11-18 DIAGNOSIS — I82409 Acute embolism and thrombosis of unspecified deep veins of unspecified lower extremity: Secondary | ICD-10-CM | POA: Diagnosis not present

## 2019-11-18 DIAGNOSIS — E039 Hypothyroidism, unspecified: Secondary | ICD-10-CM | POA: Diagnosis not present

## 2019-11-18 DIAGNOSIS — R5383 Other fatigue: Secondary | ICD-10-CM | POA: Diagnosis not present

## 2019-11-18 DIAGNOSIS — Z79899 Other long term (current) drug therapy: Secondary | ICD-10-CM | POA: Diagnosis not present

## 2019-11-18 DIAGNOSIS — Z4821 Encounter for aftercare following heart transplant: Secondary | ICD-10-CM | POA: Diagnosis not present

## 2019-11-18 DIAGNOSIS — A414 Sepsis due to anaerobes: Secondary | ICD-10-CM | POA: Diagnosis not present

## 2019-11-18 DIAGNOSIS — R0600 Dyspnea, unspecified: Secondary | ICD-10-CM | POA: Diagnosis not present

## 2019-11-18 DIAGNOSIS — I10 Essential (primary) hypertension: Secondary | ICD-10-CM | POA: Diagnosis not present

## 2019-11-18 DIAGNOSIS — R918 Other nonspecific abnormal finding of lung field: Secondary | ICD-10-CM | POA: Diagnosis not present

## 2019-12-11 DIAGNOSIS — R0600 Dyspnea, unspecified: Secondary | ICD-10-CM | POA: Diagnosis not present

## 2019-12-11 DIAGNOSIS — R002 Palpitations: Secondary | ICD-10-CM | POA: Diagnosis not present

## 2019-12-22 DIAGNOSIS — R0600 Dyspnea, unspecified: Secondary | ICD-10-CM | POA: Diagnosis not present

## 2019-12-22 DIAGNOSIS — R0602 Shortness of breath: Secondary | ICD-10-CM | POA: Diagnosis not present

## 2019-12-24 DIAGNOSIS — M5136 Other intervertebral disc degeneration, lumbar region: Secondary | ICD-10-CM | POA: Diagnosis not present

## 2019-12-24 DIAGNOSIS — I10 Essential (primary) hypertension: Secondary | ICD-10-CM | POA: Diagnosis not present

## 2019-12-24 DIAGNOSIS — M810 Age-related osteoporosis without current pathological fracture: Secondary | ICD-10-CM | POA: Diagnosis not present

## 2019-12-24 DIAGNOSIS — M7090 Unspecified soft tissue disorder related to use, overuse and pressure of unspecified site: Secondary | ICD-10-CM | POA: Diagnosis not present

## 2019-12-29 ENCOUNTER — Ambulatory Visit: Payer: Self-pay

## 2019-12-29 ENCOUNTER — Other Ambulatory Visit: Payer: Self-pay

## 2019-12-29 ENCOUNTER — Encounter: Payer: Self-pay | Admitting: Physician Assistant

## 2019-12-29 ENCOUNTER — Ambulatory Visit: Payer: Medicare Other | Admitting: Physician Assistant

## 2019-12-29 DIAGNOSIS — M545 Low back pain, unspecified: Secondary | ICD-10-CM

## 2019-12-29 DIAGNOSIS — M546 Pain in thoracic spine: Secondary | ICD-10-CM | POA: Diagnosis not present

## 2019-12-29 MED ORDER — TRAMADOL HCL 50 MG PO TABS: 50.0000 mg | ORAL_TABLET | Freq: Four times a day (QID) | ORAL | 0 refills | Status: DC | PRN

## 2019-12-29 NOTE — Progress Notes (Signed)
Office Visit Note   Patient: [name] [name]           Date of Birth: July 21, 1941           MRN: [id number] Visit Date: 12/29/2019              Requested by: Martha Clan, MD [address] [place],  [place] [address] PCP: Martha Clan, MD   Assessment & Plan: Visit Diagnoses:  1. Acute midline low back pain without sciatica     Plan: We sent in tramadol for her back pain mostly at night she is to use this definitely not while driving.  Also have her begin physical therapy at [facility] for her back.  Prescription is given for range of motion, core strengthening, stretching and modalities lumbar spine.  See back in 3 weeks to see what type of response she had to the injection.  Follow-Up Instructions: Return in about 3 weeks (around 01/19/2020).   Orders:  Orders Placed This Encounter  Procedures  . XR Thoracic Spine 2 View  . XR Lumbar Spine 2-3 Views   Meds ordered this encounter  Medications  . traMADol (ULTRAM) 50 MG tablet    Sig: Take 1 tablet (50 mg total) by mouth every 6 (six) hours as needed.    Dispense:  30 tablet    Refill:  0      Procedures: No procedures performed   Clinical Data: No additional findings.   Subjective: Chief Complaint  Patient presents with  . Spine - Pain    HPI [name] [name] is an 79 year old female who we have not seen for some time.  She comes in today due to mid to low back pain.  Pains been ongoing for few months now.  No particular injury.  She has recently moved to [place].  She rates her pain to be 10 out of 10 pain at worst.  She did see her primary care physician's office in she was given prednisone Dosepak which she has been on since last Thursday she is now tapering down and states that the Dosepak only helped for about 3 days.  She denies any radicular symptoms down either leg.  Pain is worse with walking or standing.  No saddle anesthesia symptoms, change in bowel or bladder, or radicular symptoms.  She also  denies any waking pain.  She is also tried lidocaine patches which give her some short-term relief.  Review of Systems See HPI otherwise negative noncontributory  Objective: Vital Signs: There were no vitals taken for this visit.  Physical Exam Constitutional:      Appearance: She is not ill-appearing or diaphoretic.  Cardiovascular:     Pulses: Normal pulses.  Pulmonary:     Effort: Pulmonary effort is normal.  Neurological:     Mental Status: She is alert and oriented to person, place, and time.  Psychiatric:        Mood and Affect: Mood normal.     Ortho Exam Lumbar spine she has tenderness in the left mid lumbar paraspinous region.  No pain with forward flexion or extension lumbar spine.  Both extension flexion is somewhat limited.  Negative straight leg raise bilaterally.  Tight hamstrings bilaterally.  Out of 5 strength throughout lower extremities against resistance.  Deep tendon reflexes are 2+ at knees and ankles and equal symmetric. Specialty Comments:  No specialty comments available.  Imaging: XR Thoracic Spine 2 View  Result Date: 12/29/2019 Thoracic spine 2 views: No acute fractures.  Mid to lower thoracic spine with anterior endplate osteophytes.  Sternal wires visible from prior cardiac surgery.  No spondylolisthesis.  XR Lumbar Spine 2-3 Views  Result Date: 12/29/2019 Lumbar spine AP lateral views: No acute fractures.  Facet arthritic changes lower lumbar region.  Grade 1 retrolisthesis L4-5.    PMFS History: Patient Active Problem List   Diagnosis Date Noted  . Colitis 07/18/2016  . Depression   . 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 HTN (hypertension)    Past Medical History:  Diagnosis Date  . A-fib ([place])    resolved  . Anxiety   . CHF (congestive heart failure) ([place])    resolved  . Depression   . DVT (deep venous thrombosis) ([place])   . GERD (gastroesophageal reflux disease)   . Heart transplanted [facility])    Followed at [facility] - Dr. Shon Hale  .  HTN (hypertension)   . Hyperparathyroidism ([place])    resolved  . Osteoporosis   . Pacemaker    resolved    Family History  Problem Relation Age of Onset  . Heart attack Mother   . Colon cancer Father   . Heart attack Maternal Grandmother   . Heart attack Paternal Grandmother   . Diabetes type II Paternal Grandmother   . OCD Daughter     Past Surgical History:  Procedure Laterality Date  . ABDOMINAL HYSTERECTOMY    . ABLATION  2007  . ABLATION ON ENDOMETRIOSIS    . APPENDECTOMY    . BREAST EXCISIONAL BIOPSY Left 1986   benign  . CARDIAC CATHETERIZATION  2009  . HEART TRANSPLANT  2009  . HEART TRANSPLANT    . NECK SURGERY     removal of parathyroid  . PACEMAKER INSERTION  2005  . TOTAL ABDOMINAL HYSTERECTOMY W/ BILATERAL SALPINGOOPHORECTOMY     Social History   Occupational History  . Occupation: Retired   Tobacco Use  . Smoking status: Former Research scientist (life sciences)  . Smokeless tobacco: Never Used  . Tobacco comment: Quit 1960  Substance and Sexual Activity  . Alcohol use: No    Alcohol/week: 0.0 standard drinks  . Drug use: No  . Sexual activity: Not on file

## 2020-01-07 DIAGNOSIS — M6281 Muscle weakness (generalized): Secondary | ICD-10-CM | POA: Diagnosis not present

## 2020-01-07 DIAGNOSIS — M545 Low back pain: Secondary | ICD-10-CM | POA: Diagnosis not present

## 2020-01-08 ENCOUNTER — Telehealth: Payer: Self-pay | Admitting: Orthopaedic Surgery

## 2020-01-08 NOTE — Telephone Encounter (Signed)
Received call from Kelsey-(PT) with [facility] needing last note and imaging results faxed to her. The fax number is [phone] The phone number is [phone]

## 2020-01-09 DIAGNOSIS — M545 Low back pain: Secondary | ICD-10-CM | POA: Diagnosis not present

## 2020-01-09 DIAGNOSIS — M6281 Muscle weakness (generalized): Secondary | ICD-10-CM | POA: Diagnosis not present

## 2020-01-09 NOTE — Telephone Encounter (Signed)
faxed

## 2020-01-12 DIAGNOSIS — M6281 Muscle weakness (generalized): Secondary | ICD-10-CM | POA: Diagnosis not present

## 2020-01-12 DIAGNOSIS — M545 Low back pain: Secondary | ICD-10-CM | POA: Diagnosis not present

## 2020-01-14 ENCOUNTER — Telehealth: Payer: Self-pay | Admitting: Orthopaedic Surgery

## 2020-01-14 DIAGNOSIS — M6281 Muscle weakness (generalized): Secondary | ICD-10-CM | POA: Diagnosis not present

## 2020-01-14 DIAGNOSIS — M545 Low back pain: Secondary | ICD-10-CM | POA: Diagnosis not present

## 2020-01-14 NOTE — Telephone Encounter (Signed)
Patient called.   She is requesting a call back to discuss her PT. She is feels that she needs to be enrolled longer   Call back: [phone]

## 2020-01-15 DIAGNOSIS — I10 Essential (primary) hypertension: Secondary | ICD-10-CM | POA: Diagnosis not present

## 2020-01-15 DIAGNOSIS — E038 Other specified hypothyroidism: Secondary | ICD-10-CM | POA: Diagnosis not present

## 2020-01-15 DIAGNOSIS — R7301 Impaired fasting glucose: Secondary | ICD-10-CM | POA: Diagnosis not present

## 2020-01-15 DIAGNOSIS — E7849 Other hyperlipidemia: Secondary | ICD-10-CM | POA: Diagnosis not present

## 2020-01-19 ENCOUNTER — Ambulatory Visit: Payer: Medicare Other | Admitting: Physician Assistant

## 2020-01-28 ENCOUNTER — Ambulatory Visit: Payer: Medicare Other | Admitting: Physician Assistant

## 2020-02-10 DIAGNOSIS — M6281 Muscle weakness (generalized): Secondary | ICD-10-CM | POA: Diagnosis not present

## 2020-02-10 DIAGNOSIS — M545 Low back pain: Secondary | ICD-10-CM | POA: Diagnosis not present

## 2020-02-13 DIAGNOSIS — M6281 Muscle weakness (generalized): Secondary | ICD-10-CM | POA: Diagnosis not present

## 2020-02-13 DIAGNOSIS — M545 Low back pain: Secondary | ICD-10-CM | POA: Diagnosis not present

## 2020-02-16 DIAGNOSIS — M6281 Muscle weakness (generalized): Secondary | ICD-10-CM | POA: Diagnosis not present

## 2020-02-16 DIAGNOSIS — M545 Low back pain: Secondary | ICD-10-CM | POA: Diagnosis not present

## 2020-02-18 DIAGNOSIS — M6281 Muscle weakness (generalized): Secondary | ICD-10-CM | POA: Diagnosis not present

## 2020-02-18 DIAGNOSIS — M545 Low back pain: Secondary | ICD-10-CM | POA: Diagnosis not present

## 2020-02-23 DIAGNOSIS — M545 Low back pain: Secondary | ICD-10-CM | POA: Diagnosis not present

## 2020-02-23 DIAGNOSIS — M6281 Muscle weakness (generalized): Secondary | ICD-10-CM | POA: Diagnosis not present

## 2020-03-02 DIAGNOSIS — M545 Low back pain: Secondary | ICD-10-CM | POA: Diagnosis not present

## 2020-03-02 DIAGNOSIS — M6281 Muscle weakness (generalized): Secondary | ICD-10-CM | POA: Diagnosis not present

## 2020-03-03 DIAGNOSIS — Z79899 Other long term (current) drug therapy: Secondary | ICD-10-CM | POA: Diagnosis not present

## 2020-03-03 DIAGNOSIS — Z941 Heart transplant status: Secondary | ICD-10-CM | POA: Diagnosis not present

## 2020-03-04 DIAGNOSIS — R0600 Dyspnea, unspecified: Secondary | ICD-10-CM | POA: Diagnosis not present

## 2020-03-04 DIAGNOSIS — Z941 Heart transplant status: Secondary | ICD-10-CM | POA: Diagnosis not present

## 2020-03-04 DIAGNOSIS — R002 Palpitations: Secondary | ICD-10-CM | POA: Diagnosis not present

## 2020-03-04 DIAGNOSIS — R0609 Other forms of dyspnea: Secondary | ICD-10-CM | POA: Diagnosis not present

## 2020-03-04 DIAGNOSIS — Z79899 Other long term (current) drug therapy: Secondary | ICD-10-CM | POA: Diagnosis not present

## 2020-03-05 DIAGNOSIS — M545 Low back pain: Secondary | ICD-10-CM | POA: Diagnosis not present

## 2020-03-05 DIAGNOSIS — M6281 Muscle weakness (generalized): Secondary | ICD-10-CM | POA: Diagnosis not present

## 2020-03-08 DIAGNOSIS — M6281 Muscle weakness (generalized): Secondary | ICD-10-CM | POA: Diagnosis not present

## 2020-03-08 DIAGNOSIS — M545 Low back pain: Secondary | ICD-10-CM | POA: Diagnosis not present

## 2020-03-10 DIAGNOSIS — M545 Low back pain: Secondary | ICD-10-CM | POA: Diagnosis not present

## 2020-03-10 DIAGNOSIS — M6281 Muscle weakness (generalized): Secondary | ICD-10-CM | POA: Diagnosis not present

## 2020-03-23 DIAGNOSIS — M6281 Muscle weakness (generalized): Secondary | ICD-10-CM | POA: Diagnosis not present

## 2020-03-23 DIAGNOSIS — M545 Low back pain: Secondary | ICD-10-CM | POA: Diagnosis not present

## 2020-05-25 DIAGNOSIS — R197 Diarrhea, unspecified: Secondary | ICD-10-CM | POA: Diagnosis not present

## 2020-05-25 DIAGNOSIS — R531 Weakness: Secondary | ICD-10-CM | POA: Diagnosis not present

## 2020-05-25 DIAGNOSIS — A09 Infectious gastroenteritis and colitis, unspecified: Secondary | ICD-10-CM | POA: Diagnosis not present

## 2020-05-25 DIAGNOSIS — K219 Gastro-esophageal reflux disease without esophagitis: Secondary | ICD-10-CM | POA: Diagnosis not present

## 2020-05-27 DIAGNOSIS — A09 Infectious gastroenteritis and colitis, unspecified: Secondary | ICD-10-CM | POA: Diagnosis not present

## 2020-07-06 DIAGNOSIS — R198 Other specified symptoms and signs involving the digestive system and abdomen: Secondary | ICD-10-CM | POA: Diagnosis not present

## 2020-07-06 DIAGNOSIS — A0472 Enterocolitis due to Clostridium difficile, not specified as recurrent: Secondary | ICD-10-CM | POA: Diagnosis not present

## 2020-07-27 DIAGNOSIS — R7301 Impaired fasting glucose: Secondary | ICD-10-CM | POA: Diagnosis not present

## 2020-07-27 DIAGNOSIS — M810 Age-related osteoporosis without current pathological fracture: Secondary | ICD-10-CM | POA: Diagnosis not present

## 2020-07-27 DIAGNOSIS — E039 Hypothyroidism, unspecified: Secondary | ICD-10-CM | POA: Diagnosis not present

## 2020-07-27 DIAGNOSIS — E785 Hyperlipidemia, unspecified: Secondary | ICD-10-CM | POA: Diagnosis not present

## 2020-08-03 DIAGNOSIS — R198 Other specified symptoms and signs involving the digestive system and abdomen: Secondary | ICD-10-CM | POA: Diagnosis not present

## 2020-08-03 DIAGNOSIS — A0472 Enterocolitis due to Clostridium difficile, not specified as recurrent: Secondary | ICD-10-CM | POA: Diagnosis not present

## 2020-08-03 DIAGNOSIS — K219 Gastro-esophageal reflux disease without esophagitis: Secondary | ICD-10-CM | POA: Diagnosis not present

## 2020-08-03 DIAGNOSIS — R159 Full incontinence of feces: Secondary | ICD-10-CM | POA: Diagnosis not present

## 2020-08-11 DIAGNOSIS — Z79899 Other long term (current) drug therapy: Secondary | ICD-10-CM | POA: Diagnosis not present

## 2020-08-11 DIAGNOSIS — Z941 Heart transplant status: Secondary | ICD-10-CM | POA: Diagnosis not present

## 2020-08-12 DIAGNOSIS — J986 Disorders of diaphragm: Secondary | ICD-10-CM | POA: Diagnosis not present

## 2020-08-12 DIAGNOSIS — G4730 Sleep apnea, unspecified: Secondary | ICD-10-CM | POA: Diagnosis not present

## 2020-08-12 DIAGNOSIS — Z941 Heart transplant status: Secondary | ICD-10-CM | POA: Diagnosis not present

## 2020-08-12 DIAGNOSIS — E039 Hypothyroidism, unspecified: Secondary | ICD-10-CM | POA: Diagnosis not present

## 2020-08-12 DIAGNOSIS — Z4821 Encounter for aftercare following heart transplant: Secondary | ICD-10-CM | POA: Diagnosis not present

## 2020-08-12 DIAGNOSIS — I10 Essential (primary) hypertension: Secondary | ICD-10-CM | POA: Diagnosis not present

## 2020-08-12 DIAGNOSIS — R918 Other nonspecific abnormal finding of lung field: Secondary | ICD-10-CM | POA: Diagnosis not present

## 2020-08-12 DIAGNOSIS — Z86718 Personal history of other venous thrombosis and embolism: Secondary | ICD-10-CM | POA: Diagnosis not present

## 2020-08-12 DIAGNOSIS — Z87891 Personal history of nicotine dependence: Secondary | ICD-10-CM | POA: Diagnosis not present

## 2020-08-12 DIAGNOSIS — R0609 Other forms of dyspnea: Secondary | ICD-10-CM | POA: Diagnosis not present

## 2020-08-12 DIAGNOSIS — Z7901 Long term (current) use of anticoagulants: Secondary | ICD-10-CM | POA: Diagnosis not present

## 2020-08-12 DIAGNOSIS — Z79899 Other long term (current) drug therapy: Secondary | ICD-10-CM | POA: Diagnosis not present

## 2020-08-18 DIAGNOSIS — R059 Cough, unspecified: Secondary | ICD-10-CM | POA: Diagnosis not present

## 2020-08-24 ENCOUNTER — Other Ambulatory Visit: Payer: Self-pay | Admitting: Internal Medicine

## 2020-08-24 DIAGNOSIS — R053 Chronic cough: Secondary | ICD-10-CM

## 2020-09-01 DIAGNOSIS — Z941 Heart transplant status: Secondary | ICD-10-CM | POA: Diagnosis not present

## 2020-09-05 DIAGNOSIS — I498 Other specified cardiac arrhythmias: Secondary | ICD-10-CM | POA: Diagnosis not present

## 2020-09-06 DIAGNOSIS — U071 COVID-19: Secondary | ICD-10-CM | POA: Diagnosis not present

## 2020-09-06 DIAGNOSIS — A0472 Enterocolitis due to Clostridium difficile, not specified as recurrent: Secondary | ICD-10-CM | POA: Diagnosis not present

## 2020-09-06 DIAGNOSIS — Z941 Heart transplant status: Secondary | ICD-10-CM | POA: Diagnosis not present

## 2020-09-06 DIAGNOSIS — R059 Cough, unspecified: Secondary | ICD-10-CM | POA: Diagnosis not present

## 2020-09-07 ENCOUNTER — Telehealth: Payer: Self-pay | Admitting: Family

## 2020-09-07 DIAGNOSIS — U071 COVID-19: Secondary | ICD-10-CM | POA: Diagnosis not present

## 2020-09-07 NOTE — Telephone Encounter (Signed)
Called to discuss with patient about COVID-19 symptoms and the use of one of the available treatments for those with mild to moderate Covid symptoms and at a high risk of hospitalization.  Pt appears to qualify for outpatient treatment due to co-morbid conditions and/or a member of an at-risk group in accordance with the FDA Emergency Use Authorization.    [name] [name] received treatment through [facility] and no longer requires treatment.   Marcos Eke, NP 09/07/2020 1:32 PM

## 2020-09-14 ENCOUNTER — Other Ambulatory Visit: Payer: Medicare Other

## 2020-09-30 ENCOUNTER — Ambulatory Visit
Admission: RE | Admit: 2020-09-30 | Discharge: 2020-09-30 | Disposition: A | Discharge status: Home / Self Care | Payer: Medicare Other | Source: Ambulatory Visit | Attending: Internal Medicine | Admitting: Internal Medicine

## 2020-09-30 DIAGNOSIS — R053 Chronic cough: Secondary | ICD-10-CM | POA: Diagnosis not present

## 2020-10-08 DIAGNOSIS — A0472 Enterocolitis due to Clostridium difficile, not specified as recurrent: Secondary | ICD-10-CM | POA: Diagnosis not present

## 2020-10-08 DIAGNOSIS — R42 Dizziness and giddiness: Secondary | ICD-10-CM | POA: Diagnosis not present

## 2020-10-08 DIAGNOSIS — Z1152 Encounter for screening for COVID-19: Secondary | ICD-10-CM | POA: Diagnosis not present

## 2020-10-08 DIAGNOSIS — E785 Hyperlipidemia, unspecified: Secondary | ICD-10-CM | POA: Diagnosis not present

## 2020-10-08 DIAGNOSIS — R110 Nausea: Secondary | ICD-10-CM | POA: Diagnosis not present

## 2020-10-13 DIAGNOSIS — R110 Nausea: Secondary | ICD-10-CM | POA: Diagnosis not present

## 2020-10-13 DIAGNOSIS — R152 Fecal urgency: Secondary | ICD-10-CM | POA: Diagnosis not present

## 2020-10-19 DIAGNOSIS — D1801 Hemangioma of skin and subcutaneous tissue: Secondary | ICD-10-CM | POA: Diagnosis not present

## 2020-10-19 DIAGNOSIS — L570 Actinic keratosis: Secondary | ICD-10-CM | POA: Diagnosis not present

## 2020-10-19 DIAGNOSIS — Z85828 Personal history of other malignant neoplasm of skin: Secondary | ICD-10-CM | POA: Diagnosis not present

## 2020-10-19 DIAGNOSIS — L821 Other seborrheic keratosis: Secondary | ICD-10-CM | POA: Diagnosis not present

## 2020-11-10 DIAGNOSIS — Z5181 Encounter for therapeutic drug level monitoring: Secondary | ICD-10-CM | POA: Diagnosis not present

## 2020-11-10 DIAGNOSIS — Z941 Heart transplant status: Secondary | ICD-10-CM | POA: Diagnosis not present

## 2020-11-10 DIAGNOSIS — Z4821 Encounter for aftercare following heart transplant: Secondary | ICD-10-CM | POA: Diagnosis not present

## 2020-11-10 DIAGNOSIS — Z79899 Other long term (current) drug therapy: Secondary | ICD-10-CM | POA: Diagnosis not present

## 2020-11-11 DIAGNOSIS — Z941 Heart transplant status: Secondary | ICD-10-CM | POA: Diagnosis not present

## 2020-12-08 ENCOUNTER — Emergency Department (HOSPITAL_COMMUNITY): Payer: Medicare Other

## 2020-12-08 ENCOUNTER — Encounter (HOSPITAL_COMMUNITY): Payer: Self-pay | Admitting: Emergency Medicine

## 2020-12-08 ENCOUNTER — Emergency Department (HOSPITAL_COMMUNITY)
Admission: EM | Admit: 2020-12-08 | Discharge: 2020-12-08 | Disposition: A | Discharge status: Home / Self Care | Payer: Medicare Other | Attending: Emergency Medicine | Admitting: Emergency Medicine

## 2020-12-08 ENCOUNTER — Other Ambulatory Visit: Payer: Self-pay

## 2020-12-08 DIAGNOSIS — Z9049 Acquired absence of other specified parts of digestive tract: Secondary | ICD-10-CM | POA: Diagnosis not present

## 2020-12-08 DIAGNOSIS — K2970 Gastritis, unspecified, without bleeding: Secondary | ICD-10-CM | POA: Diagnosis not present

## 2020-12-08 DIAGNOSIS — K219 Gastro-esophageal reflux disease without esophagitis: Secondary | ICD-10-CM | POA: Insufficient documentation

## 2020-12-08 DIAGNOSIS — K449 Diaphragmatic hernia without obstruction or gangrene: Secondary | ICD-10-CM | POA: Diagnosis not present

## 2020-12-08 DIAGNOSIS — K319 Disease of stomach and duodenum, unspecified: Secondary | ICD-10-CM | POA: Diagnosis not present

## 2020-12-08 DIAGNOSIS — Z79899 Other long term (current) drug therapy: Secondary | ICD-10-CM | POA: Diagnosis not present

## 2020-12-08 DIAGNOSIS — Z87891 Personal history of nicotine dependence: Secondary | ICD-10-CM | POA: Insufficient documentation

## 2020-12-08 DIAGNOSIS — R1012 Left upper quadrant pain: Secondary | ICD-10-CM | POA: Insufficient documentation

## 2020-12-08 DIAGNOSIS — Z950 Presence of cardiac pacemaker: Secondary | ICD-10-CM | POA: Diagnosis not present

## 2020-12-08 DIAGNOSIS — R109 Unspecified abdominal pain: Secondary | ICD-10-CM | POA: Diagnosis not present

## 2020-12-08 DIAGNOSIS — S3991XA Unspecified injury of abdomen, initial encounter: Secondary | ICD-10-CM | POA: Diagnosis not present

## 2020-12-08 DIAGNOSIS — R1011 Right upper quadrant pain: Secondary | ICD-10-CM | POA: Diagnosis not present

## 2020-12-08 DIAGNOSIS — R1084 Generalized abdominal pain: Secondary | ICD-10-CM | POA: Diagnosis not present

## 2020-12-08 DIAGNOSIS — I700 Atherosclerosis of aorta: Secondary | ICD-10-CM | POA: Diagnosis not present

## 2020-12-08 DIAGNOSIS — R1033 Periumbilical pain: Secondary | ICD-10-CM | POA: Insufficient documentation

## 2020-12-08 DIAGNOSIS — I4891 Unspecified atrial fibrillation: Secondary | ICD-10-CM | POA: Diagnosis not present

## 2020-12-08 DIAGNOSIS — R110 Nausea: Secondary | ICD-10-CM | POA: Diagnosis not present

## 2020-12-08 DIAGNOSIS — Z7901 Long term (current) use of anticoagulants: Secondary | ICD-10-CM | POA: Insufficient documentation

## 2020-12-08 DIAGNOSIS — Z7982 Long term (current) use of aspirin: Secondary | ICD-10-CM | POA: Insufficient documentation

## 2020-12-08 DIAGNOSIS — I110 Hypertensive heart disease with heart failure: Secondary | ICD-10-CM | POA: Insufficient documentation

## 2020-12-08 DIAGNOSIS — R1030 Lower abdominal pain, unspecified: Secondary | ICD-10-CM | POA: Diagnosis not present

## 2020-12-08 DIAGNOSIS — I509 Heart failure, unspecified: Secondary | ICD-10-CM | POA: Diagnosis not present

## 2020-12-08 DIAGNOSIS — I10 Essential (primary) hypertension: Secondary | ICD-10-CM | POA: Diagnosis not present

## 2020-12-08 DIAGNOSIS — R1013 Epigastric pain: Secondary | ICD-10-CM | POA: Diagnosis not present

## 2020-12-08 DIAGNOSIS — R42 Dizziness and giddiness: Secondary | ICD-10-CM | POA: Diagnosis not present

## 2020-12-08 DIAGNOSIS — K3189 Other diseases of stomach and duodenum: Secondary | ICD-10-CM | POA: Diagnosis not present

## 2020-12-08 DIAGNOSIS — Q433 Congenital malformations of intestinal fixation: Secondary | ICD-10-CM | POA: Diagnosis not present

## 2020-12-08 LAB — CBC WITH DIFFERENTIAL/PLATELET
Abs Immature Granulocytes: 0.05 10*3/uL (ref 0.00–0.07)
Basophils Absolute: 0.1 10*3/uL (ref 0.0–0.1)
Basophils Relative: 0 %
Eosinophils Absolute: 0.1 10*3/uL (ref 0.0–0.5)
Eosinophils Relative: 1 %
HCT: 46.2 % — ABNORMAL HIGH (ref 36.0–46.0)
Hemoglobin: 15.1 g/dL — ABNORMAL HIGH (ref 12.0–15.0)
Immature Granulocytes: 0 %
Lymphocytes Relative: 14 %
Lymphs Abs: 1.8 10*3/uL (ref 0.7–4.0)
MCH: 29.7 pg (ref 26.0–34.0)
MCHC: 32.7 g/dL (ref 30.0–36.0)
MCV: 90.8 fL (ref 80.0–100.0)
Monocytes Absolute: 1 10*3/uL (ref 0.1–1.0)
Monocytes Relative: 8 %
Neutro Abs: 10.1 10*3/uL — ABNORMAL HIGH (ref 1.7–7.7)
Neutrophils Relative %: 77 %
Platelets: 315 10*3/uL (ref 150–400)
RBC: 5.09 MIL/uL (ref 3.87–5.11)
RDW: 13.6 % (ref 11.5–15.5)
WBC: 13 10*3/uL — ABNORMAL HIGH (ref 4.0–10.5)
nRBC: 0 % (ref 0.0–0.2)

## 2020-12-08 LAB — TROPONIN I (HIGH SENSITIVITY)
Troponin I (High Sensitivity): 9 ng/L (ref <18)
Troponin I (High Sensitivity): 11 ng/L (ref <18)

## 2020-12-08 LAB — COMPREHENSIVE METABOLIC PANEL
ALT: 13 U/L (ref 0–44)
AST: 19 U/L (ref 15–41)
Albumin: 3.4 g/dL — ABNORMAL LOW (ref 3.5–5.0)
Alkaline Phosphatase: 88 U/L (ref 38–126)
Anion gap: 10 (ref 5–15)
BUN: 13 mg/dL (ref 8–23)
CO2: 21 mmol/L — ABNORMAL LOW (ref 22–32)
Calcium: 9.2 mg/dL (ref 8.9–10.3)
Chloride: 110 mmol/L (ref 98–111)
Creatinine, Ser: 1.18 mg/dL — ABNORMAL HIGH (ref 0.44–1.00)
GFR, Estimated: 46 mL/min — ABNORMAL LOW (ref >60)
Glucose, Bld: 150 mg/dL — ABNORMAL HIGH (ref 70–99)
Potassium: 4 mmol/L (ref 3.5–5.1)
Sodium: 141 mmol/L (ref 135–145)
Total Bilirubin: 0.8 mg/dL (ref 0.3–1.2)
Total Protein: 6.9 g/dL (ref 6.5–8.1)

## 2020-12-08 LAB — LIPASE, BLOOD: Lipase: 26 U/L (ref 11–51)

## 2020-12-08 MED ORDER — ONDANSETRON HCL 4 MG/2ML IJ SOLN
4.0000 mg | Freq: Once | INTRAMUSCULAR | Status: AC
  Administered 2020-12-08: 4 mg via INTRAVENOUS
  Filled 2020-12-08: qty 2

## 2020-12-08 MED ORDER — FENTANYL CITRATE (PF) 100 MCG/2ML IJ SOLN
100.0000 ug | Freq: Once | INTRAMUSCULAR | Status: AC
Start: 2020-12-08 — End: 2020-12-08
  Administered 2020-12-08: 100 ug via INTRAVENOUS
  Filled 2020-12-08: qty 2

## 2020-12-08 MED ORDER — IOHEXOL 300 MG/ML  SOLN
100.0000 mL | Freq: Once | INTRAMUSCULAR | Status: AC | PRN
  Administered 2020-12-08: 100 mL via INTRAVENOUS

## 2020-12-08 MED ORDER — HYOSCYAMINE SULFATE SL 0.125 MG SL SUBL: 0.1250 mg | SUBLINGUAL_TABLET | SUBLINGUAL | 0 refills | Status: DC | PRN

## 2020-12-08 MED ORDER — FENTANYL CITRATE (PF) 100 MCG/2ML IJ SOLN: 100.0000 ug | Freq: Once | INTRAMUSCULAR | Status: DC

## 2020-12-08 MED ORDER — DICYCLOMINE HCL 10 MG PO CAPS
20.0000 mg | ORAL_CAPSULE | Freq: Once | ORAL | Status: AC
  Administered 2020-12-08: 20 mg via ORAL
  Filled 2020-12-08: qty 2

## 2020-12-08 MED ORDER — SIMETHICONE 40 MG/0.6ML PO SUSP
80.0000 mg | Freq: Once | ORAL | Status: AC
  Administered 2020-12-08: 80 mg via ORAL
  Filled 2020-12-08: qty 1.2

## 2020-12-08 MED ORDER — SIMETHICONE 80 MG PO CHEW: 80.0000 mg | CHEWABLE_TABLET | Freq: Four times a day (QID) | ORAL | 0 refills | Status: AC | PRN

## 2020-12-08 MED ORDER — FENTANYL CITRATE (PF) 100 MCG/2ML IJ SOLN
50.0000 ug | Freq: Once | INTRAMUSCULAR | Status: AC
  Administered 2020-12-08: 50 ug via INTRAVENOUS
  Filled 2020-12-08: qty 2

## 2020-12-08 NOTE — ED Triage Notes (Signed)
Pt arrives to ED via [facility] with c/o of sharp abdominal pain post endoscopy. Pt had just finished her procefure and is now experiencing sharp upper and epigastric abdominal pain. Pain tends to radiate towards the right. No N/V, diarrhea, headache, vision changes, or hematemesis. Endo showed only a hiatal hernia.

## 2020-12-08 NOTE — ED Provider Notes (Signed)
[facility] EMERGENCY DEPARTMENT Provider Note   CSN: [id number] Arrival date & time: 12/08/20  1106     History Chief Complaint  Patient presents with  . Abdominal Pain    [name] [name] is a 80 y.o. female with a past medical history of A. fib, CHF, heart transplant followed at [facility].  Hyperparathyroidism and GERD he was sent from the endoscopy suite for onset of severe abdominal pain post upper endoscopy procedure this morning.  Patient states that she was in her normal state of health prior to her endoscopy.  It is reported that she was given propofol and lidocaine for her procedure.  When she awoke she had 8 out of 10, severe pain around the umbilicus radiating bilaterally, up toward the epigastrium and over to the right upper quadrant of her abdomen.  The pain does not radiate to her back.  She denies nausea or vomiting.  She denies any chest pain or shortness of breath.  Is a history of a hiatal hernia.  According to report nothing of significance was found on the endoscopy but some biopsies were taken. HPI     Past Medical History:  Diagnosis Date  . A-fib ([facility])    resolved  . Anxiety   . CHF (congestive heart failure) ([facility])    resolved  . Depression   . DVT (deep venous thrombosis) ([facility])   . GERD (gastroesophageal reflux disease)   . Heart transplanted [facility])    Followed at [facility] - Dr. Laurence Compton  . HTN (hypertension)   . Hyperparathyroidism ([facility])    resolved  . Osteoporosis   . Pacemaker    resolved    Patient Active Problem List   Diagnosis Date Noted  . Colitis 07/18/2016  . Depression   . Heart transplanted ([facility])   . HTN (hypertension)     Past Surgical History:  Procedure Laterality Date  . ABDOMINAL HYSTERECTOMY    . ABLATION  2007  . ABLATION ON ENDOMETRIOSIS    . APPENDECTOMY    . BREAST EXCISIONAL BIOPSY Left 1986   benign  . CARDIAC CATHETERIZATION  2009  . HEART TRANSPLANT  2009  . HEART  TRANSPLANT    . NECK SURGERY     removal of parathyroid  . PACEMAKER INSERTION  2005  . TOTAL ABDOMINAL HYSTERECTOMY W/ BILATERAL SALPINGOOPHORECTOMY       OB History   No obstetric history on file.     Family History  Problem Relation Age of Onset  . Heart attack Mother   . Colon cancer Father   . Heart attack Maternal Grandmother   . Heart attack Paternal Grandmother   . Diabetes type II Paternal Grandmother   . OCD Daughter     Social History   Tobacco Use  . Smoking status: Former Games developer  . Smokeless tobacco: Never Used  . Tobacco comment: Quit 1960  Substance Use Topics  . Alcohol use: No    Alcohol/week: 0.0 standard drinks  . Drug use: No    Home Medications Prior to Admission medications   Medication Sig Start Date End Date Taking? Authorizing Provider  Hyoscyamine Sulfate SL (LEVSIN/SL) 0.125 MG SUBL Place 0.125 mg under the tongue every 4 (four) hours as needed (pain). Up to 1.25 mg daily 12/08/20  Yes Kei Langhorst, PA-C  simethicone (GAS-X) 80 MG chewable tablet Chew 1 tablet (80 mg total) by mouth every 6 (six) hours as needed for flatulence. 12/08/20  Yes Arthor Captain,  PA-C  acetaminophen (TYLENOL) 500 MG tablet Take by mouth.    [provider]  aspirin 81 MG tablet Take 81 mg by mouth daily.    [provider]  cephALEXin (KEFLEX) 500 MG capsule Take 1 capsule (500 mg total) by mouth 4 (four) times daily. 01/07/17   Deatra Canter, FNP  ciprofloxacin (CIPRO) 500 MG tablet Take 1 tablet (500 mg total) by mouth every 12 (twelve) hours. 01/30/17   Earley Favor, NP  clonazePAM (KLONOPIN) 1 MG tablet Take 1 mg by mouth at bedtime.     [provider]  conjugated estrogens (PREMARIN) vaginal cream Place 1 Applicatorful vaginally 2 (two) times a week.     [provider]  cyanocobalamin 500 MCG tablet Take 500 mcg by mouth daily.    [provider]  diltiazem (CARDIZEM CD) 120 MG 24 hr capsule Take 120 mg by mouth  2 (two) times daily. 07/06/16   [provider]  diltiazem (TIAZAC) 120 MG 24 hr capsule TAKE 1 CAPSULE (120 MG TOTAL) BY MOUTH 2 TIMES DAILY. 10/09/19   [provider]  escitalopram (LEXAPRO) 20 MG tablet Take 20 mg by mouth daily.    [provider]  famotidine (PEPCID) 20 MG tablet Take by mouth. 11/07/19   [provider]  fluticasone furoate-vilanterol (BREO ELLIPTA) 100-25 MCG/INH AEPB Inhale into the lungs.    [provider]  iron polysaccharides (NIFEREX) 150 MG capsule Take 150 mg by mouth daily.    [provider]  levothyroxine (SYNTHROID, LEVOTHROID) 50 MCG tablet Take 50 mcg by mouth daily before breakfast.    [provider]  loperamide (IMODIUM) 2 MG capsule Take 2 mg by mouth as needed for diarrhea or loose stools.     [provider]  losartan (COZAAR) 100 MG tablet TAKE 1 TABLET BY MOUTH TWICE DAILY 12/15/19   [provider]  magnesium oxide (MAG-OX) 400 MG tablet Take 400 mg by mouth daily.    [provider]  metroNIDAZOLE (FLAGYL) 500 MG tablet Take 1 tablet (500 mg total) by mouth 2 (two) times daily. 01/30/17   Earley Favor, NP  Multiple Vitamin (MULTI-VITAMIN) tablet Take by mouth.    [provider]  Multiple Vitamins-Minerals (MULTIVITAMIN PO) Take 1 tablet by mouth daily.    [provider]  mycophenolate (CELLCEPT) 250 MG capsule Take 250 mg by mouth 2 (two) times daily.     [provider]  phenazopyridine (PYRIDIUM) 200 MG tablet Take 1 tablet (200 mg total) by mouth 3 (three) times daily. 01/07/17   Deatra Canter, FNP  Probiotic Product (ALIGN) CHEW Chew by mouth.    [provider]  rivaroxaban (XARELTO) 20 MG TABS tablet Take 20 mg by mouth daily with supper.    [provider]  tacrolimus (PROGRAF) 0.5 MG capsule Take 0.5 mg by mouth 2 (two) times daily.    [provider]  traMADol (ULTRAM) 50 MG tablet Take 1 tablet (50  mg total) by mouth every 6 (six) hours as needed. 12/29/19   Kirtland Bouchard, PA-C  vancomycin (VANCOCIN) 50 mg/mL oral solution Take 2.5 mLs (125 mg total) by mouth every 6 (six) hours. For 11 days 07/22/16   Leatha Gilding, MD  Vitamin D, Ergocalciferol, (DRISDOL) 50000 UNITS CAPS capsule Take 50,000 Units by mouth every Friday.     [provider]    Allergies    Antihistamines, chlorpheniramine-type; Cyclobenzaprine; and Codeine  Review of Systems  Review of Systems Ten systems reviewed and are negative for acute change, except as noted in the HPI.   Physical Exam Updated Vital Signs BP (!) 154/64   Pulse 81   Temp 98 F (36.7 C) (Oral)   Resp (!) 27   Ht  (1.6 m)   Wt 77.1 kg   SpO2 94%   BMI 30.11 kg/m   Physical Exam Vitals and nursing note reviewed.  Constitutional:      General: She is not in acute distress.    Appearance: She is well-developed. She is not diaphoretic.     Comments: Patient appears very uncomfortable.  She is tremulous, breathing is guarded  HENT:     Head: Normocephalic and atraumatic.  Eyes:     General: No scleral icterus.    Conjunctiva/sclera: Conjunctivae normal.  Cardiovascular:     Rate and Rhythm: Normal rate and regular rhythm.     Heart sounds: Normal heart sounds. No murmur heard. No friction rub. No gallop.   Pulmonary:     Effort: Pulmonary effort is normal. No respiratory distress.     Breath sounds: Normal breath sounds.  Abdominal:     General: Bowel sounds are normal. There is distension.     Palpations: Abdomen is soft. There is no mass.     Tenderness: There is abdominal tenderness in the right upper quadrant, epigastric area, periumbilical area and left upper quadrant. There is guarding.     Comments: Exquisitely tender to palpation in the upper abdomen.  Less so in the lower abdomen although she does have tenderness.  Abdomen is moderately firm and feels distended in the upper portion of the abdomen.   Musculoskeletal:     Cervical back: Normal range of motion.  Skin:    General: Skin is warm and dry.  Neurological:     Mental Status: She is alert and oriented to person, place, and time.  Psychiatric:        Behavior: Behavior normal.     ED Results / Procedures / Treatments   Labs (all labs ordered are listed, but only abnormal results are displayed) Labs Reviewed  CBC WITH DIFFERENTIAL/PLATELET - Abnormal; Notable for the following components:      Result Value   WBC 13.0 (*)    Hemoglobin 15.1 (*)    HCT 46.2 (*)    Neutro Abs 10.1 (*)    All other components within normal limits  COMPREHENSIVE METABOLIC PANEL - Abnormal; Notable for the following components:   CO2 21 (*)    Glucose, Bld 150 (*)    Creatinine, Ser 1.18 (*)    Albumin 3.4 (*)    GFR, Estimated 46 (*)    All other components within normal limits  LIPASE, BLOOD  URINALYSIS, ROUTINE W REFLEX MICROSCOPIC  TROPONIN I (HIGH SENSITIVITY)  TROPONIN I (HIGH SENSITIVITY)    EKG EKG Interpretation  Date/Time:  Wednesday December 08 2020 12:42:22 EDT Ventricular Rate:  77 PR Interval:  167 QRS Duration: 135 QT Interval:  439 QTC Calculation: 497 R Axis:   45 Text Interpretation: Sinus rhythm Nonspecific intraventricular conduction delay Abnormal T, consider ischemia, diffuse leads Confirmed by Benjiman Core [phone]) on 12/08/2020 12:46:08 PM   Radiology CT Abdomen Pelvis W Contrast  Result Date: 12/08/2020 CLINICAL DATA:  Abdominal trauma EXAM: CT ABDOMEN AND PELVIS WITH CONTRAST TECHNIQUE: Multidetector CT imaging of the abdomen and pelvis was performed using the standard protocol following bolus administration of intravenous contrast. CONTRAST:  OMNIPAQUE  IOHEXOL 300 MG/ML  SOLN COMPARISON:  05/16/2019 FINDINGS: Lower chest: No acute abnormality. Small hiatal hernia with intrathoracic position of the gastric fundus. Hepatobiliary: No solid liver abnormality is seen. No gallstones, gallbladder wall  thickening, or biliary dilatation. Pancreas: Unremarkable. No pancreatic ductal dilatation or surrounding inflammatory changes. Spleen: Normal in size without significant abnormality. Adrenals/Urinary Tract: Adrenal glands are unremarkable. Kidneys are normal, without renal calculi, solid lesion, or hydronephrosis. Bladder is unremarkable. Stomach/Bowel: Stomach is within normal limits. There is a somewhat tethered and featureless appearance of the transverse colon (series 3, image 49, series 6, image 35). Appendix is not clearly visualized. No evidence of bowel wall thickening, distention, or inflammatory changes. Descending and sigmoid diverticulosis. Vascular/Lymphatic: Aortic atherosclerosis. No enlarged abdominal or pelvic lymph nodes. Reproductive: Status post hysterectomy. Other: No abdominal wall hernia or abnormality. No abdominopelvic ascites. Musculoskeletal: No acute or significant osseous findings. IMPRESSION: 1. No CT evidence of acute traumatic injury to the abdomen or pelvis. 2. There is a somewhat tethered and featureless appearance of the transverse colon. This appearance may reflect sequelae of nonspecific infectious, inflammatory, or ischemic colitis and particularly may reflect inflammatory bowel disease. 3. Small hiatal hernia with intrathoracic position of the gastric fundus. 4. Descending and sigmoid diverticulosis without evidence of acute diverticulitis. 5. Status post hysterectomy. Aortic Atherosclerosis (ICD10-I70.0). Electronically Signed   By: Lauralyn PrimesAlex  Bibbey M.D.   On: 12/08/2020 13:48   DG Abd Acute W/Chest  Result Date: 12/08/2020 CLINICAL DATA:  Post endoscopy.  Lower abdominal pain. EXAM: DG ABDOMEN ACUTE WITH 1 VIEW CHEST COMPARISON:  Chest radiographs, 09/06/2020. CT abdomen pelvis, 05/16/2019. FINDINGS: Distended loop of bowel, consistent with the hepatic flexure of the colon, in the right upper quadrant. No other bowel dilation. No air-fluid levels and no free air. No evidence  of renal or ureteral stones. Soft tissues are unremarkable. Stable changes from a previous median sternotomy. Cardiac silhouette is normal in size. No mediastinal or hilar masses. Mild linear opacities at the right lung base consistent with scarring or atelectasis, above an elevated hemidiaphragm. Lungs otherwise clear. Skeletal structures are demineralized but grossly intact. IMPRESSION: 1. Hepatic flexure of the colon is mildly dilated, nonspecific. There is no evidence of bowel obstruction and there is no free air. 2. No acute cardiopulmonary disease. Electronically Signed   By: Amie Portlandavid  Ormond M.D.   On: 12/08/2020 12:24    Procedures Procedures  Medications Ordered in ED Medications  ondansetron (ZOFRAN) injection 4 mg (4 mg Intravenous Given 12/08/20 1137)  fentaNYL (SUBLIMAZE) injection 100 mcg (100 mcg Intravenous Given 12/08/20 1137)  iohexol (OMNIPAQUE) 300 MG/ML solution 100 mL (100 mLs Intravenous Contrast Given 12/08/20 1321)  simethicone (MYLICON) 40 mg/0.1066ml suspension 80 mg (80 mg Oral Given 12/08/20 1525)  dicyclomine (BENTYL) capsule 20 mg (20 mg Oral Given 12/08/20 1440)  fentaNYL (SUBLIMAZE) injection 50 mcg (50 mcg Intravenous Given 12/08/20 1441)    ED Course  I have reviewed the triage vital signs and the nursing notes.  Pertinent labs & imaging results that were available during my care of the patient were reviewed by me and considered in my medical decision making (see chart for details).    MDM Rules/Calculators/A&P                          80 year old female here with acute abdominal pain after waking up from an upper endoscopy this morning. The patient was seen and shared visit with myself and Dr. Matthias HughsBuccini.  Differential  diagnosis includes perforation, volvulus, ACS.  I ordered and reviewed labs that included a CBC which shows mildly elevated white blood cell count likely acute phase reactant.  CMP shows creatinine of 1.18 likely due to some mild dehydration.  Troponin  within normal limits, lipase within normal limits.  EKG shows normal sinus rhythm at a rate of 77.  She has some T wave flattening however I highly doubt ACS as the cause of her pain. Portable 1 view chest x-ray shows no evidence of free air.  I ordered and reviewed a CT abdomen and pelvis which I reviewed with Dr. Matthias Hughs.  Although the radiologist is calling for potential chronic colitis this is not likely cause the pain of her complaint.  Dr. Matthias Hughs does state that her pyloric valve was wide open and he had a lot of difficulty insufflating the stomach due to her large hiatal hernia and suspects he may have been blowing air directly into the duodenum.  On the CT scan and plain film there is a very large gas bubble in this region which is likely what is causing her pain.  Patient has no active vomiting here in the emergency department.  She was given pain with medication which improved her symptoms temporarily.  Patient will be discharged home with Levsin and simethicone.  Discussed outpatient follow-up and return precautions.Patient reviewed and discussed with attending physician. Who agrees with assessment, work up , treatment, and plan for discharge    Final Clinical Impression(s) / ED Diagnoses Final diagnoses:  Epigastric pain    Rx / DC Orders ED Discharge Orders         Ordered    Hyoscyamine Sulfate SL (LEVSIN/SL) 0.125 MG SUBL  Every 4 hours PRN        12/08/20 1501    simethicone (GAS-X) 80 MG chewable tablet  Every 6 hours PRN        12/08/20 1501           Arthor Captain, PA-C 12/08/20 1527    Benjiman Core, MD 12/08/20 1554

## 2020-12-08 NOTE — ED Notes (Signed)
Patient verbalizes understanding of discharge instructions. Opportunity for questioning and answers were provided. Armband removed by staff, pt discharged from ED.  

## 2020-12-08 NOTE — Discharge Instructions (Addendum)

## 2020-12-15 DIAGNOSIS — I10 Essential (primary) hypertension: Secondary | ICD-10-CM | POA: Diagnosis not present

## 2020-12-15 DIAGNOSIS — R918 Other nonspecific abnormal finding of lung field: Secondary | ICD-10-CM | POA: Diagnosis not present

## 2020-12-15 DIAGNOSIS — Z8619 Personal history of other infectious and parasitic diseases: Secondary | ICD-10-CM | POA: Diagnosis not present

## 2020-12-15 DIAGNOSIS — Z4821 Encounter for aftercare following heart transplant: Secondary | ICD-10-CM | POA: Diagnosis not present

## 2020-12-15 DIAGNOSIS — Z79899 Other long term (current) drug therapy: Secondary | ICD-10-CM | POA: Diagnosis not present

## 2020-12-15 DIAGNOSIS — J986 Disorders of diaphragm: Secondary | ICD-10-CM | POA: Diagnosis not present

## 2020-12-15 DIAGNOSIS — Z941 Heart transplant status: Secondary | ICD-10-CM | POA: Diagnosis not present

## 2020-12-15 DIAGNOSIS — Z79890 Hormone replacement therapy (postmenopausal): Secondary | ICD-10-CM | POA: Diagnosis not present

## 2020-12-15 DIAGNOSIS — G4700 Insomnia, unspecified: Secondary | ICD-10-CM | POA: Diagnosis not present

## 2020-12-15 DIAGNOSIS — I824Z2 Acute embolism and thrombosis of unspecified deep veins of left distal lower extremity: Secondary | ICD-10-CM | POA: Diagnosis not present

## 2020-12-15 DIAGNOSIS — E039 Hypothyroidism, unspecified: Secondary | ICD-10-CM | POA: Diagnosis not present

## 2020-12-15 DIAGNOSIS — Z87891 Personal history of nicotine dependence: Secondary | ICD-10-CM | POA: Diagnosis not present

## 2020-12-15 DIAGNOSIS — Z7901 Long term (current) use of anticoagulants: Secondary | ICD-10-CM | POA: Diagnosis not present

## 2020-12-15 DIAGNOSIS — I080 Rheumatic disorders of both mitral and aortic valves: Secondary | ICD-10-CM | POA: Diagnosis not present

## 2020-12-15 DIAGNOSIS — Z8616 Personal history of COVID-19: Secondary | ICD-10-CM | POA: Diagnosis not present

## 2020-12-15 DIAGNOSIS — Z9089 Acquired absence of other organs: Secondary | ICD-10-CM | POA: Diagnosis not present

## 2020-12-15 DIAGNOSIS — K319 Disease of stomach and duodenum, unspecified: Secondary | ICD-10-CM | POA: Diagnosis not present

## 2020-12-20 DIAGNOSIS — Z1159 Encounter for screening for other viral diseases: Secondary | ICD-10-CM | POA: Diagnosis not present

## 2020-12-20 DIAGNOSIS — Z20828 Contact with and (suspected) exposure to other viral communicable diseases: Secondary | ICD-10-CM | POA: Diagnosis not present

## 2020-12-27 DIAGNOSIS — Z20828 Contact with and (suspected) exposure to other viral communicable diseases: Secondary | ICD-10-CM | POA: Diagnosis not present

## 2020-12-27 DIAGNOSIS — Z1159 Encounter for screening for other viral diseases: Secondary | ICD-10-CM | POA: Diagnosis not present

## 2021-01-03 DIAGNOSIS — Z1159 Encounter for screening for other viral diseases: Secondary | ICD-10-CM | POA: Diagnosis not present

## 2021-01-03 DIAGNOSIS — Z20828 Contact with and (suspected) exposure to other viral communicable diseases: Secondary | ICD-10-CM | POA: Diagnosis not present

## 2021-01-12 DIAGNOSIS — R5381 Other malaise: Secondary | ICD-10-CM | POA: Diagnosis not present

## 2021-01-12 DIAGNOSIS — R110 Nausea: Secondary | ICD-10-CM | POA: Diagnosis not present

## 2021-01-17 DIAGNOSIS — Z20828 Contact with and (suspected) exposure to other viral communicable diseases: Secondary | ICD-10-CM | POA: Diagnosis not present

## 2021-01-17 DIAGNOSIS — Z1159 Encounter for screening for other viral diseases: Secondary | ICD-10-CM | POA: Diagnosis not present

## 2021-01-25 ENCOUNTER — Other Ambulatory Visit: Payer: Self-pay | Admitting: Internal Medicine

## 2021-01-25 DIAGNOSIS — R911 Solitary pulmonary nodule: Secondary | ICD-10-CM

## 2021-01-26 DIAGNOSIS — Z941 Heart transplant status: Secondary | ICD-10-CM | POA: Diagnosis not present

## 2021-01-31 DIAGNOSIS — Z1159 Encounter for screening for other viral diseases: Secondary | ICD-10-CM | POA: Diagnosis not present

## 2021-01-31 DIAGNOSIS — Z20828 Contact with and (suspected) exposure to other viral communicable diseases: Secondary | ICD-10-CM | POA: Diagnosis not present

## 2021-02-03 DIAGNOSIS — F3341 Major depressive disorder, recurrent, in partial remission: Secondary | ICD-10-CM | POA: Diagnosis not present

## 2021-02-03 DIAGNOSIS — R5382 Chronic fatigue, unspecified: Secondary | ICD-10-CM | POA: Diagnosis not present

## 2021-02-03 DIAGNOSIS — R531 Weakness: Secondary | ICD-10-CM | POA: Diagnosis not present

## 2021-02-03 DIAGNOSIS — D849 Immunodeficiency, unspecified: Secondary | ICD-10-CM | POA: Diagnosis not present

## 2021-02-07 DIAGNOSIS — K529 Noninfective gastroenteritis and colitis, unspecified: Secondary | ICD-10-CM | POA: Diagnosis not present

## 2021-02-07 DIAGNOSIS — Z8619 Personal history of other infectious and parasitic diseases: Secondary | ICD-10-CM | POA: Diagnosis not present

## 2021-02-11 ENCOUNTER — Ambulatory Visit
Admission: RE | Admit: 2021-02-11 | Discharge: 2021-02-11 | Disposition: A | Discharge status: Home / Self Care | Payer: Medicare Other | Source: Ambulatory Visit | Attending: Internal Medicine | Admitting: Internal Medicine

## 2021-02-11 DIAGNOSIS — R911 Solitary pulmonary nodule: Secondary | ICD-10-CM

## 2021-02-11 DIAGNOSIS — J9811 Atelectasis: Secondary | ICD-10-CM | POA: Diagnosis not present

## 2021-02-11 DIAGNOSIS — I2510 Atherosclerotic heart disease of native coronary artery without angina pectoris: Secondary | ICD-10-CM | POA: Diagnosis not present

## 2021-02-11 DIAGNOSIS — K449 Diaphragmatic hernia without obstruction or gangrene: Secondary | ICD-10-CM | POA: Diagnosis not present

## 2021-02-11 DIAGNOSIS — J984 Other disorders of lung: Secondary | ICD-10-CM | POA: Diagnosis not present

## 2021-02-14 DIAGNOSIS — Z1159 Encounter for screening for other viral diseases: Secondary | ICD-10-CM | POA: Diagnosis not present

## 2021-02-14 DIAGNOSIS — Z20828 Contact with and (suspected) exposure to other viral communicable diseases: Secondary | ICD-10-CM | POA: Diagnosis not present

## 2021-02-18 ENCOUNTER — Other Ambulatory Visit (HOSPITAL_COMMUNITY): Payer: Self-pay | Admitting: *Deleted

## 2021-02-21 ENCOUNTER — Inpatient Hospital Stay (HOSPITAL_COMMUNITY): Admission: RE | Admit: 2021-02-21 | Payer: Medicare Other | Source: Ambulatory Visit

## 2021-02-28 DIAGNOSIS — Z20828 Contact with and (suspected) exposure to other viral communicable diseases: Secondary | ICD-10-CM | POA: Diagnosis not present

## 2021-02-28 DIAGNOSIS — Z1159 Encounter for screening for other viral diseases: Secondary | ICD-10-CM | POA: Diagnosis not present

## 2021-03-01 ENCOUNTER — Encounter (HOSPITAL_COMMUNITY): Payer: Medicare Other

## 2021-03-07 DIAGNOSIS — Z1159 Encounter for screening for other viral diseases: Secondary | ICD-10-CM | POA: Diagnosis not present

## 2021-03-07 DIAGNOSIS — Z20828 Contact with and (suspected) exposure to other viral communicable diseases: Secondary | ICD-10-CM | POA: Diagnosis not present

## 2021-03-09 DIAGNOSIS — F3341 Major depressive disorder, recurrent, in partial remission: Secondary | ICD-10-CM | POA: Diagnosis not present

## 2021-03-09 DIAGNOSIS — R5382 Chronic fatigue, unspecified: Secondary | ICD-10-CM | POA: Diagnosis not present

## 2021-03-09 DIAGNOSIS — R531 Weakness: Secondary | ICD-10-CM | POA: Diagnosis not present

## 2021-03-09 DIAGNOSIS — R195 Other fecal abnormalities: Secondary | ICD-10-CM | POA: Diagnosis not present

## 2021-03-11 ENCOUNTER — Inpatient Hospital Stay (HOSPITAL_COMMUNITY): Admission: RE | Admit: 2021-03-11 | Payer: Medicare Other | Source: Ambulatory Visit

## 2021-03-11 DIAGNOSIS — Z79899 Other long term (current) drug therapy: Secondary | ICD-10-CM | POA: Diagnosis not present

## 2021-03-11 DIAGNOSIS — Z941 Heart transplant status: Secondary | ICD-10-CM | POA: Diagnosis not present

## 2021-03-14 DIAGNOSIS — Z20828 Contact with and (suspected) exposure to other viral communicable diseases: Secondary | ICD-10-CM | POA: Diagnosis not present

## 2021-03-14 DIAGNOSIS — Z1159 Encounter for screening for other viral diseases: Secondary | ICD-10-CM | POA: Diagnosis not present

## 2021-03-18 ENCOUNTER — Encounter (HOSPITAL_COMMUNITY): Payer: Medicare Other

## 2021-03-28 DIAGNOSIS — Z20828 Contact with and (suspected) exposure to other viral communicable diseases: Secondary | ICD-10-CM | POA: Diagnosis not present

## 2021-03-28 DIAGNOSIS — Z1159 Encounter for screening for other viral diseases: Secondary | ICD-10-CM | POA: Diagnosis not present

## 2021-04-14 DIAGNOSIS — Z941 Heart transplant status: Secondary | ICD-10-CM | POA: Diagnosis not present

## 2021-04-14 DIAGNOSIS — Z9089 Acquired absence of other organs: Secondary | ICD-10-CM | POA: Diagnosis not present

## 2021-04-14 DIAGNOSIS — Z4821 Encounter for aftercare following heart transplant: Secondary | ICD-10-CM | POA: Diagnosis not present

## 2021-04-14 DIAGNOSIS — Z7901 Long term (current) use of anticoagulants: Secondary | ICD-10-CM | POA: Diagnosis not present

## 2021-04-14 DIAGNOSIS — I10 Essential (primary) hypertension: Secondary | ICD-10-CM | POA: Diagnosis not present

## 2021-04-14 DIAGNOSIS — I110 Hypertensive heart disease with heart failure: Secondary | ICD-10-CM | POA: Diagnosis not present

## 2021-04-14 DIAGNOSIS — G4730 Sleep apnea, unspecified: Secondary | ICD-10-CM | POA: Diagnosis not present

## 2021-04-14 DIAGNOSIS — E039 Hypothyroidism, unspecified: Secondary | ICD-10-CM | POA: Diagnosis not present

## 2021-04-14 DIAGNOSIS — Z79899 Other long term (current) drug therapy: Secondary | ICD-10-CM | POA: Diagnosis not present

## 2021-04-18 ENCOUNTER — Encounter: Payer: Self-pay | Admitting: *Deleted

## 2021-04-18 DIAGNOSIS — Z20828 Contact with and (suspected) exposure to other viral communicable diseases: Secondary | ICD-10-CM | POA: Diagnosis not present

## 2021-04-19 ENCOUNTER — Ambulatory Visit: Payer: Medicare Other | Admitting: Neurology

## 2021-04-19 ENCOUNTER — Encounter: Payer: Self-pay | Admitting: Neurology

## 2021-04-25 DIAGNOSIS — Z20828 Contact with and (suspected) exposure to other viral communicable diseases: Secondary | ICD-10-CM | POA: Diagnosis not present

## 2021-05-02 DIAGNOSIS — Z20828 Contact with and (suspected) exposure to other viral communicable diseases: Secondary | ICD-10-CM | POA: Diagnosis not present

## 2021-05-09 DIAGNOSIS — Z20828 Contact with and (suspected) exposure to other viral communicable diseases: Secondary | ICD-10-CM | POA: Diagnosis not present

## 2021-05-11 DIAGNOSIS — Z941 Heart transplant status: Secondary | ICD-10-CM | POA: Diagnosis not present

## 2021-05-11 DIAGNOSIS — J069 Acute upper respiratory infection, unspecified: Secondary | ICD-10-CM | POA: Diagnosis not present

## 2021-05-11 DIAGNOSIS — R5382 Chronic fatigue, unspecified: Secondary | ICD-10-CM | POA: Diagnosis not present

## 2021-05-11 DIAGNOSIS — L304 Erythema intertrigo: Secondary | ICD-10-CM | POA: Diagnosis not present

## 2021-05-18 DIAGNOSIS — E039 Hypothyroidism, unspecified: Secondary | ICD-10-CM | POA: Diagnosis not present

## 2021-05-18 DIAGNOSIS — R5383 Other fatigue: Secondary | ICD-10-CM | POA: Diagnosis not present

## 2021-05-18 DIAGNOSIS — Z941 Heart transplant status: Secondary | ICD-10-CM | POA: Diagnosis not present

## 2021-05-18 DIAGNOSIS — R531 Weakness: Secondary | ICD-10-CM | POA: Diagnosis not present

## 2021-05-18 DIAGNOSIS — R5381 Other malaise: Secondary | ICD-10-CM | POA: Diagnosis not present

## 2021-05-18 DIAGNOSIS — R5382 Chronic fatigue, unspecified: Secondary | ICD-10-CM | POA: Diagnosis not present

## 2021-05-18 DIAGNOSIS — E785 Hyperlipidemia, unspecified: Secondary | ICD-10-CM | POA: Diagnosis not present

## 2021-05-18 DIAGNOSIS — I10 Essential (primary) hypertension: Secondary | ICD-10-CM | POA: Diagnosis not present

## 2021-05-24 ENCOUNTER — Encounter (HOSPITAL_BASED_OUTPATIENT_CLINIC_OR_DEPARTMENT_OTHER): Payer: Self-pay | Admitting: Emergency Medicine

## 2021-05-24 ENCOUNTER — Emergency Department (HOSPITAL_BASED_OUTPATIENT_CLINIC_OR_DEPARTMENT_OTHER): Payer: Medicare Other | Admitting: Radiology

## 2021-05-24 ENCOUNTER — Other Ambulatory Visit: Payer: Self-pay

## 2021-05-24 ENCOUNTER — Emergency Department (HOSPITAL_BASED_OUTPATIENT_CLINIC_OR_DEPARTMENT_OTHER)
Admission: EM | Admit: 2021-05-24 | Discharge: 2021-05-24 | Disposition: A | Discharge status: Home / Self Care | Payer: Medicare Other | Attending: Emergency Medicine | Admitting: Emergency Medicine

## 2021-05-24 DIAGNOSIS — I110 Hypertensive heart disease with heart failure: Secondary | ICD-10-CM | POA: Insufficient documentation

## 2021-05-24 DIAGNOSIS — R079 Chest pain, unspecified: Secondary | ICD-10-CM | POA: Diagnosis not present

## 2021-05-24 DIAGNOSIS — Z87891 Personal history of nicotine dependence: Secondary | ICD-10-CM | POA: Insufficient documentation

## 2021-05-24 DIAGNOSIS — R0789 Other chest pain: Secondary | ICD-10-CM | POA: Diagnosis not present

## 2021-05-24 DIAGNOSIS — R600 Localized edema: Secondary | ICD-10-CM | POA: Diagnosis not present

## 2021-05-24 DIAGNOSIS — Z7982 Long term (current) use of aspirin: Secondary | ICD-10-CM | POA: Insufficient documentation

## 2021-05-24 DIAGNOSIS — Z79899 Other long term (current) drug therapy: Secondary | ICD-10-CM | POA: Insufficient documentation

## 2021-05-24 DIAGNOSIS — I509 Heart failure, unspecified: Secondary | ICD-10-CM | POA: Diagnosis not present

## 2021-05-24 DIAGNOSIS — K449 Diaphragmatic hernia without obstruction or gangrene: Secondary | ICD-10-CM | POA: Diagnosis not present

## 2021-05-24 DIAGNOSIS — J9811 Atelectasis: Secondary | ICD-10-CM | POA: Diagnosis not present

## 2021-05-24 LAB — CBC WITH DIFFERENTIAL/PLATELET
Abs Immature Granulocytes: 0.04 10*3/uL (ref 0.00–0.07)
Basophils Absolute: 0 10*3/uL (ref 0.0–0.1)
Basophils Relative: 0 %
Eosinophils Absolute: 0.2 10*3/uL (ref 0.0–0.5)
Eosinophils Relative: 2 %
HCT: 41.6 % (ref 36.0–46.0)
Hemoglobin: 13.4 g/dL (ref 12.0–15.0)
Immature Granulocytes: 0 %
Lymphocytes Relative: 23 %
Lymphs Abs: 2.7 10*3/uL (ref 0.7–4.0)
MCH: 28.4 pg (ref 26.0–34.0)
MCHC: 32.2 g/dL (ref 30.0–36.0)
MCV: 88.1 fL (ref 80.0–100.0)
Monocytes Absolute: 1.1 10*3/uL — ABNORMAL HIGH (ref 0.1–1.0)
Monocytes Relative: 10 %
Neutro Abs: 7.8 10*3/uL — ABNORMAL HIGH (ref 1.7–7.7)
Neutrophils Relative %: 65 %
Platelets: 354 10*3/uL (ref 150–400)
RBC: 4.72 MIL/uL (ref 3.87–5.11)
RDW: 14 % (ref 11.5–15.5)
WBC: 12 10*3/uL — ABNORMAL HIGH (ref 4.0–10.5)
nRBC: 0 % (ref 0.0–0.2)

## 2021-05-24 LAB — BASIC METABOLIC PANEL
Anion gap: 9 (ref 5–15)
BUN: 15 mg/dL (ref 8–23)
CO2: 24 mmol/L (ref 22–32)
Calcium: 9.6 mg/dL (ref 8.9–10.3)
Chloride: 106 mmol/L (ref 98–111)
Creatinine, Ser: 1.07 mg/dL — ABNORMAL HIGH (ref 0.44–1.00)
GFR, Estimated: 52 mL/min — ABNORMAL LOW (ref >60)
Glucose, Bld: 110 mg/dL — ABNORMAL HIGH (ref 70–99)
Potassium: 4.6 mmol/L (ref 3.5–5.1)
Sodium: 139 mmol/L (ref 135–145)

## 2021-05-24 LAB — BRAIN NATRIURETIC PEPTIDE: B Natriuretic Peptide: 133.2 pg/mL — ABNORMAL HIGH (ref 0.0–100.0)

## 2021-05-24 LAB — TROPONIN I (HIGH SENSITIVITY): Troponin I (High Sensitivity): 8 ng/L (ref <18)

## 2021-05-24 MED ORDER — ASPIRIN 81 MG PO CHEW
324.0000 mg | CHEWABLE_TABLET | Freq: Once | ORAL | Status: AC
  Administered 2021-05-24: 324 mg via ORAL
  Filled 2021-05-24: qty 4

## 2021-05-24 NOTE — ED Provider Notes (Signed)
[facility] EMERGENCY DEPT Provider Note   CSN: [id number] Arrival date & time: 05/24/21  1526     History Chief Complaint  Patient presents with   Chest Pain    [name] [name] is a 80 y.o. female.  This is a 80 y.o. female with significant medical history as below, including heart transplant at [facility] (Dr Dot Lanes), CHF, acid reflux, HTN who presents to the ED with complaint of chest pain. She does report some URI symptoms over the last few weeks and has followed with her PCP regarding this, those symptoms did resolve around 3 days ago.   Location:  left side chest wall Duration:  5 hours Onset:  gradual  Timing:  initially intermittent, now it is constant Description:  aching, sharp. Does not remember having this type of pain in the past Severity:  mild Exacerbating/Alleviating Factors:  unable to identify  Associated Symptoms:  none Pertinent Negatives:  no nausea/ diaphoresis/ dyspnea/ fevers or chills/ no abdominal pain/ no change to bowel or bladder function. No recent diet or medication changes.    The history is provided by the patient. No language interpreter was used.  Chest Pain Pain location:  L chest Pain quality: aching   Pain severity:  Mild Onset quality:  Gradual Duration:  4 hours Timing:  Constant Progression:  Worsening Chronicity:  New Ineffective treatments:  None tried Associated symptoms: no abdominal pain, no cough, no dysphagia, no fever, no headache, no nausea, no palpitations, no shortness of breath and no vomiting       Past Medical History:  Diagnosis Date   A-fib (HCC)    resolved   Anxiety    Arrhythmia    bradycardia   CHF (congestive heart failure) (HCC)    resolved   Depression    DVT (deep venous thrombosis) (HCC)    GERD (gastroesophageal reflux disease)    Heart transplanted (HCC)    Followed at [facility] - Dr. Laurence Compton   HTN (hypertension)    Hyperparathyroidism [facility])    resolved   Osteoporosis     Pacemaker    resolved    Patient Active Problem List   Diagnosis Date Noted   Colitis 07/18/2016   Depression    Heart transplanted (HCC)    HTN (hypertension)     Past Surgical History:  Procedure Laterality Date   ABDOMINAL HYSTERECTOMY     ABLATION  2007   ABLATION ON ENDOMETRIOSIS     APPENDECTOMY     BREAST EXCISIONAL BIOPSY Left 1986   benign   CARDIAC CATHETERIZATION  2009   HEART TRANSPLANT  2009   HEART TRANSPLANT     NECK SURGERY     removal of parathyroid   PACEMAKER INSERTION  2005   TOTAL ABDOMINAL HYSTERECTOMY W/ BILATERAL SALPINGOOPHORECTOMY       OB History   No obstetric history on file.     Family History  Problem Relation Age of Onset   Heart attack Mother    Heart disease Mother    Colon cancer Father    Ulcers Father    Liver cancer Father    Colon polyps Sister    Heart attack Maternal Grandmother    Heart attack Paternal Grandmother    Diabetes type II Paternal Grandmother    OCD Daughter     Social History   Tobacco Use   Smoking status: Former   Smokeless tobacco: Never   Tobacco comments:    Quit 1960  Substance Use Topics  Alcohol use: No    Alcohol/week: 0.0 standard drinks   Drug use: No    Home Medications Prior to Admission medications   Medication Sig Start Date End Date Taking? Authorizing Provider  aspirin 81 MG tablet Take 81 mg by mouth daily.   Yes [provider]  clonazePAM (KLONOPIN) 1 MG tablet Take 1 mg by mouth at bedtime.    Yes [provider]  conjugated estrogens (PREMARIN) vaginal cream Place 1 Applicatorful vaginally 2 (two) times a week.    Yes [provider]  cyanocobalamin 500 MCG tablet Take 500 mcg by mouth daily.   Yes [provider]  desvenlafaxine (PRISTIQ) 50 MG 24 hr tablet Take 50 mg by mouth daily.   Yes [provider]  diltiazem (CARDIZEM CD) 120 MG 24 hr capsule Take 120 mg by mouth 2 (two) times daily. 07/06/16  Yes [provider]  escitalopram (LEXAPRO) 20 MG tablet Take 20 mg by mouth daily.   Yes [provider]  famotidine (PEPCID) 20 MG tablet Take by mouth. 11/07/19  Yes [provider]  fluticasone furoate-vilanterol (BREO ELLIPTA) 100-25 MCG/INH AEPB Inhale into the lungs.   Yes [provider]  iron polysaccharides (NIFEREX) 150 MG capsule Take 150 mg by mouth daily.   Yes [provider]  levothyroxine (SYNTHROID, LEVOTHROID) 50 MCG tablet Take 50 mcg by mouth daily before breakfast.   Yes [provider]  losartan (COZAAR) 100 MG tablet TAKE 1 TABLET BY MOUTH TWICE DAILY 12/15/19  Yes [provider]  magnesium oxide (MAG-OX) 400 MG tablet Take 400 mg by mouth daily.   Yes [provider]  Multiple Vitamins-Minerals (MULTIVITAMIN PO) Take 1 tablet by mouth daily.   Yes [provider]  mycophenolate (CELLCEPT) 250 MG capsule Take 250 mg by mouth 2 (two) times daily.    Yes [provider]  Probiotic Product (ALIGN) CHEW Chew by mouth.   Yes [provider]  rivaroxaban (XARELTO) 20 MG TABS tablet Take 20 mg by mouth daily with supper.   Yes [provider]  tacrolimus (PROGRAF) 0.5 MG capsule Take 0.5 mg by mouth 2 (two) times daily.   Yes [provider]  Vitamin D, Ergocalciferol, (DRISDOL) 50000 UNITS CAPS capsule Take 50,000 Units by mouth every Friday.    Yes [provider]  acetaminophen (TYLENOL) 500 MG tablet Take by mouth.    [provider]  cephALEXin (KEFLEX) 500 MG capsule Take 1 capsule (500 mg total) by mouth 4 (four) times daily. Patient not taking: No sig reported 01/07/17   Deatra Canter, FNP  ciprofloxacin (CIPRO) 500 MG tablet Take 1 tablet (500 mg total) by mouth every 12 (twelve) hours. Patient not taking: No sig reported 01/30/17   Earley Favor, NP  Hyoscyamine Sulfate SL (LEVSIN/SL) 0.125 MG SUBL Place 0.125 mg under the tongue every 4 (four) hours as needed  (pain). Up to 1.25 mg daily Patient not taking: No sig reported 12/08/20   Arthor Captain, PA-C  loperamide (IMODIUM) 2 MG capsule Take 2 mg by mouth as needed for diarrhea or loose stools.     [provider]  metroNIDAZOLE (FLAGYL) 500 MG tablet Take 1 tablet (500 mg total) by mouth 2 (two) times daily. Patient not taking: No sig reported 01/30/17   Earley Favor, NP  phenazopyridine (PYRIDIUM) 200 MG tablet Take 1 tablet (200 mg total) by mouth 3 (three) times daily. Patient not taking: No sig reported 01/07/17  Deatra Canter, FNP  simethicone (GAS-X) 80 MG chewable tablet Chew 1 tablet (80 mg total) by mouth every 6 (six) hours as needed for flatulence. 12/08/20   Arthor Captain, PA-C  traMADol (ULTRAM) 50 MG tablet Take 1 tablet (50 mg total) by mouth every 6 (six) hours as needed. 12/29/19   Kirtland Bouchard, PA-C  vancomycin (VANCOCIN) 50 mg/mL oral solution Take 2.5 mLs (125 mg total) by mouth every 6 (six) hours. For 11 days Patient not taking: Reported on 05/24/2021 07/22/16   Leatha Gilding, MD    Allergies    Antihistamines, chlorpheniramine-type; Augmentin [amoxicillin-pot clavulanate]; Cyclobenzaprine; Valtrex [valacyclovir]; and Codeine  Review of Systems   Review of Systems  Constitutional:  Negative for chills and fever.  HENT:  Negative for facial swelling and trouble swallowing.   Eyes:  Negative for photophobia and visual disturbance.  Respiratory:  Negative for cough and shortness of breath.   Cardiovascular:  Positive for chest pain. Negative for palpitations.  Gastrointestinal:  Negative for abdominal pain, nausea and vomiting.  Endocrine: Negative for polydipsia and polyuria.  Genitourinary:  Negative for difficulty urinating and hematuria.  Musculoskeletal:  Negative for gait problem and joint swelling.  Skin:  Negative for pallor and rash.  Neurological:  Negative for syncope and headaches.  Psychiatric/Behavioral:  Negative for agitation and  confusion.    Physical Exam Updated Vital Signs BP (!) 149/92   Pulse 70   Temp 98.3 F (36.8 C)   Resp (!) 26   Ht 5\' 3"  (1.6 m)   Wt 77.1 kg   SpO2 94%   BMI 30.11 kg/m   Physical Exam Vitals and nursing note reviewed.  Constitutional:      General: She is not in acute distress.    Appearance: Normal appearance. She is not ill-appearing or toxic-appearing.  HENT:     Head: Normocephalic and atraumatic.     Right Ear: External ear normal.     Left Ear: External ear normal.     Nose: Nose normal.     Mouth/Throat:     Mouth: Mucous membranes are moist.  Eyes:     General: No scleral icterus.       Right eye: No discharge.        Left eye: No discharge.  Cardiovascular:     Rate and Rhythm: Normal rate and regular rhythm.     Pulses: Normal pulses.     Heart sounds: Normal heart sounds.  Pulmonary:     Effort: Pulmonary effort is normal. No respiratory distress.     Breath sounds: Normal breath sounds.  Abdominal:     General: Abdomen is flat.     Tenderness: There is no abdominal tenderness.  Musculoskeletal:        General: Normal range of motion.     Cervical back: Normal range of motion.     Right lower leg: Edema (trace) present.     Left lower leg: Edema (trace) present.  Skin:    General: Skin is warm and dry.     Capillary Refill: Capillary refill takes less than 2 seconds.  Neurological:     Mental Status: She is alert and oriented to person, place, and time.     GCS: GCS eye subscore is 4. GCS verbal subscore is 5. GCS motor subscore is 6.  Psychiatric:        Mood and Affect: Mood normal.        Behavior: Behavior normal.  ED Results / Procedures / Treatments   Labs (all labs ordered are listed, but only abnormal results are displayed) Labs Reviewed  BASIC METABOLIC PANEL - Abnormal; Notable for the following components:      Result Value   Glucose, Bld 110 (*)    Creatinine, Ser 1.07 (*)    GFR, Estimated 52 (*)    All other components  within normal limits  CBC WITH DIFFERENTIAL/PLATELET - Abnormal; Notable for the following components:   WBC 12.0 (*)    Neutro Abs 7.8 (*)    Monocytes Absolute 1.1 (*)    All other components within normal limits  BRAIN NATRIURETIC PEPTIDE - Abnormal; Notable for the following components:   B Natriuretic Peptide 133.2 (*)    All other components within normal limits  TROPONIN I (HIGH SENSITIVITY)    EKG EKG Interpretation  Date/Time:  Tuesday May 24 2021 15:51:59 EDT Ventricular Rate:  70 PR Interval:  158 QRS Duration: 86 QT Interval:  390 QTC Calculation: 421 R Axis:   14 Text Interpretation: Normal sinus rhythm Possible Anterior infarct , age undetermined TWI in lateral leads as noted in prior ECG Similar to prior tracings Confirmed by Tanda Rockers ([id number]) on 05/24/2021 6:18:49 PM  Radiology DG Chest 2 View  Result Date: 05/24/2021 CLINICAL DATA:  LEFT-sided chest pain beginning today EXAM: CHEST - 2 VIEW COMPARISON:  07/18/2016 FINDINGS: Mild enlargement of cardiac silhouette post median sternotomy. Moderate-sized hiatal hernia. Atherosclerotic calcification aorta. Bronchitic changes with RIGHT basilar atelectasis and chronic eventration of RIGHT diaphragm. No acute infiltrate, pleural effusion, or pneumothorax. Bones demineralized. IMPRESSION: No acute abnormalities. Mild enlargement of cardiac silhouette. Hiatal hernia. Chronic bronchitic changes and RIGHT basilar atelectasis. Electronically Signed   By: Ulyses Southward M.D.   On: 05/24/2021 17:01    Procedures Procedures   Medications Ordered in ED Medications  aspirin chewable tablet 324 mg (324 mg Oral Given 05/24/21 1749)    ED Course  I have reviewed the triage vital signs and the nursing notes.  Pertinent labs & imaging results that were available during my care of the patient were reviewed by me and considered in my medical decision making (see chart for details).    MDM Rules/Calculators/A&P                          This patient complains of chest pain; this involves an extensive number of treatment options and is a complaint that carries with it a high risk of complications and morbidity. Vital signs reviewed and are stable. Serious etiologies considered.   I ordered, reviewed and interpreted labs, mild leukocytosis at 12. Labs otherwise stable  I ordered imaging studies which included CXR and I independently    visualized and interpreted imaging which showed no acute process  Additional history obtained from family  Previous records obtained and reviewed   Patient re-evaluated, she reports that she is feeling back to her baseline. She is ambulatory without any difficulty, no dyspnea, no chest pain.   Patient in no distress and overall condition improved here in the ED. Troponin negative (patient did not want to stay in ER for delta troponin, advised against this but she is adamant that she is ready to leave and that she is "fine."). CXR reviewed. Labs without demonstration of acute pathology unless otherwise noted above. Low HEART Score: 0-3 points (0.9-1.7% risk of MACE). Given the extremely low risk further testing and evaluation as an inpatient does not  appear to be indicated at this time. Detailed discussions were had with the patient regarding current findings, and is agreeable to follow up with their PCP or cardiologist within the next few days. The patient has been instructed to return immediately if the symptoms worsen in any way for re-evaluation. Patient verbalized understanding  Advised pt to f/u with transplant team and pcp.   Final Clinical Impression(s) / ED Diagnoses Final diagnoses:  Atypical chest pain    Rx / DC Orders ED Discharge Orders     None        Sloan Leiter, DO 05/25/21 2013

## 2021-05-24 NOTE — ED Triage Notes (Signed)
Pt arrives to ED with c/o of chest pain that started today. This started while she was walking around her apartment. The pain is located under her left breast. The pain started as intermittent but now constant. The pain is achy and sharp. No N/V/D. No SOB.

## 2021-05-31 DIAGNOSIS — H52203 Unspecified astigmatism, bilateral: Secondary | ICD-10-CM | POA: Diagnosis not present

## 2021-06-01 DIAGNOSIS — Z20828 Contact with and (suspected) exposure to other viral communicable diseases: Secondary | ICD-10-CM | POA: Diagnosis not present

## 2021-06-03 DIAGNOSIS — Z23 Encounter for immunization: Secondary | ICD-10-CM | POA: Diagnosis not present

## 2021-07-18 ENCOUNTER — Other Ambulatory Visit: Payer: Self-pay

## 2021-07-18 ENCOUNTER — Encounter (HOSPITAL_COMMUNITY): Payer: Self-pay | Admitting: Emergency Medicine

## 2021-07-18 ENCOUNTER — Emergency Department (HOSPITAL_COMMUNITY): Payer: Medicare Other

## 2021-07-18 ENCOUNTER — Emergency Department (HOSPITAL_COMMUNITY)
Admission: EM | Admit: 2021-07-18 | Discharge: 2021-07-19 | Discharge status: Against Medical Advice | Payer: Medicare Other | Attending: Student | Admitting: Student

## 2021-07-18 DIAGNOSIS — I491 Atrial premature depolarization: Secondary | ICD-10-CM | POA: Diagnosis not present

## 2021-07-18 DIAGNOSIS — R0902 Hypoxemia: Secondary | ICD-10-CM | POA: Diagnosis not present

## 2021-07-18 DIAGNOSIS — R5383 Other fatigue: Secondary | ICD-10-CM | POA: Diagnosis not present

## 2021-07-18 DIAGNOSIS — Z5321 Procedure and treatment not carried out due to patient leaving prior to being seen by health care provider: Secondary | ICD-10-CM | POA: Diagnosis not present

## 2021-07-18 DIAGNOSIS — R42 Dizziness and giddiness: Secondary | ICD-10-CM | POA: Diagnosis not present

## 2021-07-18 DIAGNOSIS — R531 Weakness: Secondary | ICD-10-CM | POA: Diagnosis not present

## 2021-07-18 DIAGNOSIS — K449 Diaphragmatic hernia without obstruction or gangrene: Secondary | ICD-10-CM | POA: Diagnosis not present

## 2021-07-18 DIAGNOSIS — R079 Chest pain, unspecified: Secondary | ICD-10-CM | POA: Diagnosis not present

## 2021-07-18 DIAGNOSIS — R739 Hyperglycemia, unspecified: Secondary | ICD-10-CM | POA: Insufficient documentation

## 2021-07-18 DIAGNOSIS — Z20822 Contact with and (suspected) exposure to covid-19: Secondary | ICD-10-CM | POA: Diagnosis not present

## 2021-07-18 LAB — CBC WITH DIFFERENTIAL/PLATELET
Abs Immature Granulocytes: 0.04 10*3/uL (ref 0.00–0.07)
Basophils Absolute: 0.1 10*3/uL (ref 0.0–0.1)
Basophils Relative: 1 %
Eosinophils Absolute: 0.2 10*3/uL (ref 0.0–0.5)
Eosinophils Relative: 2 %
HCT: 43.7 % (ref 36.0–46.0)
Hemoglobin: 13.9 g/dL (ref 12.0–15.0)
Immature Granulocytes: 0 %
Lymphocytes Relative: 25 %
Lymphs Abs: 2.7 10*3/uL (ref 0.7–4.0)
MCH: 28.3 pg (ref 26.0–34.0)
MCHC: 31.8 g/dL (ref 30.0–36.0)
MCV: 88.8 fL (ref 80.0–100.0)
Monocytes Absolute: 1 10*3/uL (ref 0.1–1.0)
Monocytes Relative: 9 %
Neutro Abs: 6.9 10*3/uL (ref 1.7–7.7)
Neutrophils Relative %: 63 %
Platelets: 367 10*3/uL (ref 150–400)
RBC: 4.92 MIL/uL (ref 3.87–5.11)
RDW: 13.9 % (ref 11.5–15.5)
WBC: 10.8 10*3/uL — ABNORMAL HIGH (ref 4.0–10.5)
nRBC: 0 % (ref 0.0–0.2)

## 2021-07-18 LAB — URINALYSIS, ROUTINE W REFLEX MICROSCOPIC
Bilirubin Urine: NEGATIVE
Glucose, UA: NEGATIVE mg/dL
Hgb urine dipstick: NEGATIVE
Ketones, ur: NEGATIVE mg/dL
Leukocytes,Ua: NEGATIVE
Nitrite: NEGATIVE
Protein, ur: NEGATIVE mg/dL
Specific Gravity, Urine: 1.005 (ref 1.005–1.030)
pH: 6 (ref 5.0–8.0)

## 2021-07-18 LAB — COMPREHENSIVE METABOLIC PANEL
ALT: 13 U/L (ref 0–44)
AST: 18 U/L (ref 15–41)
Albumin: 3.1 g/dL — ABNORMAL LOW (ref 3.5–5.0)
Alkaline Phosphatase: 94 U/L (ref 38–126)
Anion gap: 9 (ref 5–15)
BUN: 13 mg/dL (ref 8–23)
CO2: 23 mmol/L (ref 22–32)
Calcium: 9.5 mg/dL (ref 8.9–10.3)
Chloride: 105 mmol/L (ref 98–111)
Creatinine, Ser: 1.07 mg/dL — ABNORMAL HIGH (ref 0.44–1.00)
GFR, Estimated: 52 mL/min — ABNORMAL LOW (ref >60)
Glucose, Bld: 189 mg/dL — ABNORMAL HIGH (ref 70–99)
Potassium: 4 mmol/L (ref 3.5–5.1)
Sodium: 137 mmol/L (ref 135–145)
Total Bilirubin: 0.4 mg/dL (ref 0.3–1.2)
Total Protein: 6.6 g/dL (ref 6.5–8.1)

## 2021-07-18 LAB — RESP PANEL BY RT-PCR (FLU A&B, COVID) ARPGX2
Influenza A by PCR: NEGATIVE
Influenza B by PCR: NEGATIVE
SARS Coronavirus 2 by RT PCR: NEGATIVE

## 2021-07-18 LAB — TSH: TSH: 2.183 u[IU]/mL (ref 0.350–4.500)

## 2021-07-18 LAB — CBG MONITORING, ED: Glucose-Capillary: 136 mg/dL — ABNORMAL HIGH (ref 70–99)

## 2021-07-18 LAB — TROPONIN I (HIGH SENSITIVITY): Troponin I (High Sensitivity): 12 ng/L (ref <18)

## 2021-07-18 NOTE — ED Triage Notes (Signed)
Patient arrived with EMS from home reports dizziness and fatigue this evening , CBG= 309 .

## 2021-07-18 NOTE — ED Provider Notes (Addendum)
Emergency Medicine Provider Triage Evaluation Note  [name] [name] , a 80 y.o. female  was evaluated in triage.  Pt complains of weakness today. Patient has felt generally weak, intermittently exhausted and has had some lightheadedness, chills, and shakiness.  Also reports left sided chest pain, constant, no alleviating/aggravating factors, no change with exertion/deep breathing, and cough.    Review of Systems  Positive: Chest pain, weakness, chills, cough, lightheaded Negative: Nausea, vomiting, diarrhea, abdominal pain, dyspnea, fever, congestion  Physical Exam  BP (!) 148/62   Pulse 77   Temp 97.9 F (36.6 C)   Resp 16   SpO2 95%  Gen:   Awake, no distress   Resp:  Normal effort  MSK:   Moves extremities without difficulty  Other:  PERRL. EOMI. Sensation grossly intact x 4. 5/5 symmetric grip strength, intact finger to nose. Negative pronator drift.   Medical Decision Making  Medically screening exam initiated at 10:09 PM.  Appropriate orders placed.  [name] [name] was informed that the remainder of the evaluation will be completed by another provider, this initial triage assessment does not replace that evaluation, and the importance of remaining in the ED until their evaluation is complete.  Weakness   [name] Anderson, PA-C 07/18/21 [address] 07/18/21 2238    Gwyneth Sprout, MD 07/19/21 2244

## 2021-07-19 NOTE — ED Notes (Signed)
Pt decided to leave while waiting for a room.  

## 2021-07-29 DIAGNOSIS — R42 Dizziness and giddiness: Secondary | ICD-10-CM | POA: Diagnosis not present

## 2021-07-29 DIAGNOSIS — M6281 Muscle weakness (generalized): Secondary | ICD-10-CM | POA: Diagnosis not present

## 2021-07-29 DIAGNOSIS — R2689 Other abnormalities of gait and mobility: Secondary | ICD-10-CM | POA: Diagnosis not present

## 2021-08-02 DIAGNOSIS — R42 Dizziness and giddiness: Secondary | ICD-10-CM | POA: Diagnosis not present

## 2021-08-02 DIAGNOSIS — M6281 Muscle weakness (generalized): Secondary | ICD-10-CM | POA: Diagnosis not present

## 2021-08-02 DIAGNOSIS — R2689 Other abnormalities of gait and mobility: Secondary | ICD-10-CM | POA: Diagnosis not present

## 2021-08-05 DIAGNOSIS — M6281 Muscle weakness (generalized): Secondary | ICD-10-CM | POA: Diagnosis not present

## 2021-08-05 DIAGNOSIS — R42 Dizziness and giddiness: Secondary | ICD-10-CM | POA: Diagnosis not present

## 2021-08-05 DIAGNOSIS — R2689 Other abnormalities of gait and mobility: Secondary | ICD-10-CM | POA: Diagnosis not present

## 2021-08-10 DIAGNOSIS — Z1159 Encounter for screening for other viral diseases: Secondary | ICD-10-CM | POA: Diagnosis not present

## 2021-08-10 DIAGNOSIS — Z20828 Contact with and (suspected) exposure to other viral communicable diseases: Secondary | ICD-10-CM | POA: Diagnosis not present

## 2021-08-14 ENCOUNTER — Emergency Department (HOSPITAL_COMMUNITY): Payer: Medicare Other

## 2021-08-14 ENCOUNTER — Encounter (HOSPITAL_COMMUNITY): Payer: Self-pay | Admitting: Emergency Medicine

## 2021-08-14 ENCOUNTER — Inpatient Hospital Stay (HOSPITAL_COMMUNITY)
Admission: EM | Admit: 2021-08-14 | Discharge: 2021-08-19 | DRG: 872 | Disposition: A | Discharge status: Home Health | Payer: Medicare Other | Attending: Internal Medicine | Admitting: Internal Medicine

## 2021-08-14 ENCOUNTER — Other Ambulatory Visit: Payer: Self-pay

## 2021-08-14 DIAGNOSIS — E860 Dehydration: Secondary | ICD-10-CM | POA: Diagnosis not present

## 2021-08-14 DIAGNOSIS — F419 Anxiety disorder, unspecified: Secondary | ICD-10-CM | POA: Diagnosis present

## 2021-08-14 DIAGNOSIS — D84821 Immunodeficiency due to drugs: Secondary | ICD-10-CM | POA: Diagnosis present

## 2021-08-14 DIAGNOSIS — N179 Acute kidney failure, unspecified: Secondary | ICD-10-CM | POA: Diagnosis not present

## 2021-08-14 DIAGNOSIS — A419 Sepsis, unspecified organism: Principal | ICD-10-CM | POA: Diagnosis present

## 2021-08-14 DIAGNOSIS — Z20822 Contact with and (suspected) exposure to covid-19: Secondary | ICD-10-CM | POA: Diagnosis not present

## 2021-08-14 DIAGNOSIS — Z885 Allergy status to narcotic agent status: Secondary | ICD-10-CM

## 2021-08-14 DIAGNOSIS — K5732 Diverticulitis of large intestine without perforation or abscess without bleeding: Secondary | ICD-10-CM | POA: Diagnosis present

## 2021-08-14 DIAGNOSIS — Z7951 Long term (current) use of inhaled steroids: Secondary | ICD-10-CM

## 2021-08-14 DIAGNOSIS — Z9049 Acquired absence of other specified parts of digestive tract: Secondary | ICD-10-CM

## 2021-08-14 DIAGNOSIS — Z941 Heart transplant status: Secondary | ICD-10-CM

## 2021-08-14 DIAGNOSIS — R531 Weakness: Secondary | ICD-10-CM | POA: Diagnosis present

## 2021-08-14 DIAGNOSIS — K449 Diaphragmatic hernia without obstruction or gangrene: Secondary | ICD-10-CM | POA: Diagnosis not present

## 2021-08-14 DIAGNOSIS — M810 Age-related osteoporosis without current pathological fracture: Secondary | ICD-10-CM | POA: Diagnosis not present

## 2021-08-14 DIAGNOSIS — K219 Gastro-esophageal reflux disease without esophagitis: Secondary | ICD-10-CM | POA: Diagnosis present

## 2021-08-14 DIAGNOSIS — I10 Essential (primary) hypertension: Secondary | ICD-10-CM | POA: Diagnosis not present

## 2021-08-14 DIAGNOSIS — Z79899 Other long term (current) drug therapy: Secondary | ICD-10-CM

## 2021-08-14 DIAGNOSIS — Z8249 Family history of ischemic heart disease and other diseases of the circulatory system: Secondary | ICD-10-CM

## 2021-08-14 DIAGNOSIS — Z79890 Hormone replacement therapy (postmenopausal): Secondary | ICD-10-CM

## 2021-08-14 DIAGNOSIS — I129 Hypertensive chronic kidney disease with stage 1 through stage 4 chronic kidney disease, or unspecified chronic kidney disease: Secondary | ICD-10-CM | POA: Diagnosis not present

## 2021-08-14 DIAGNOSIS — E039 Hypothyroidism, unspecified: Secondary | ICD-10-CM | POA: Diagnosis not present

## 2021-08-14 DIAGNOSIS — Z881 Allergy status to other antibiotic agents status: Secondary | ICD-10-CM

## 2021-08-14 DIAGNOSIS — K5792 Diverticulitis of intestine, part unspecified, without perforation or abscess without bleeding: Secondary | ICD-10-CM

## 2021-08-14 DIAGNOSIS — Z7982 Long term (current) use of aspirin: Secondary | ICD-10-CM

## 2021-08-14 DIAGNOSIS — G4733 Obstructive sleep apnea (adult) (pediatric): Secondary | ICD-10-CM | POA: Diagnosis present

## 2021-08-14 DIAGNOSIS — R112 Nausea with vomiting, unspecified: Secondary | ICD-10-CM

## 2021-08-14 DIAGNOSIS — F32A Depression, unspecified: Secondary | ICD-10-CM | POA: Diagnosis not present

## 2021-08-14 DIAGNOSIS — Z86718 Personal history of other venous thrombosis and embolism: Secondary | ICD-10-CM

## 2021-08-14 DIAGNOSIS — R1030 Lower abdominal pain, unspecified: Secondary | ICD-10-CM | POA: Diagnosis not present

## 2021-08-14 DIAGNOSIS — K5900 Constipation, unspecified: Secondary | ICD-10-CM | POA: Diagnosis not present

## 2021-08-14 DIAGNOSIS — Z7960 Long term (current) use of unspecified immunomodulators and immunosuppressants: Secondary | ICD-10-CM

## 2021-08-14 DIAGNOSIS — E876 Hypokalemia: Secondary | ICD-10-CM | POA: Diagnosis not present

## 2021-08-14 DIAGNOSIS — Z87891 Personal history of nicotine dependence: Secondary | ICD-10-CM | POA: Diagnosis not present

## 2021-08-14 DIAGNOSIS — E213 Hyperparathyroidism, unspecified: Secondary | ICD-10-CM | POA: Diagnosis present

## 2021-08-14 DIAGNOSIS — N1831 Chronic kidney disease, stage 3a: Secondary | ICD-10-CM | POA: Diagnosis not present

## 2021-08-14 DIAGNOSIS — Z880 Allergy status to penicillin: Secondary | ICD-10-CM

## 2021-08-14 DIAGNOSIS — R109 Unspecified abdominal pain: Secondary | ICD-10-CM | POA: Diagnosis not present

## 2021-08-14 DIAGNOSIS — Z90710 Acquired absence of both cervix and uterus: Secondary | ICD-10-CM | POA: Diagnosis not present

## 2021-08-14 DIAGNOSIS — Z7901 Long term (current) use of anticoagulants: Secondary | ICD-10-CM

## 2021-08-14 DIAGNOSIS — Z888 Allergy status to other drugs, medicaments and biological substances status: Secondary | ICD-10-CM

## 2021-08-14 DIAGNOSIS — R1084 Generalized abdominal pain: Secondary | ICD-10-CM | POA: Diagnosis not present

## 2021-08-14 DIAGNOSIS — R58 Hemorrhage, not elsewhere classified: Secondary | ICD-10-CM | POA: Diagnosis not present

## 2021-08-14 LAB — COMPREHENSIVE METABOLIC PANEL
ALT: 15 U/L (ref 0–44)
AST: 23 U/L (ref 15–41)
Albumin: 3.5 g/dL (ref 3.5–5.0)
Alkaline Phosphatase: 118 U/L (ref 38–126)
Anion gap: 12 (ref 5–15)
BUN: 10 mg/dL (ref 8–23)
CO2: 21 mmol/L — ABNORMAL LOW (ref 22–32)
Calcium: 9.5 mg/dL (ref 8.9–10.3)
Chloride: 103 mmol/L (ref 98–111)
Creatinine, Ser: 1.13 mg/dL — ABNORMAL HIGH (ref 0.44–1.00)
GFR, Estimated: 49 mL/min — ABNORMAL LOW (ref >60)
Glucose, Bld: 143 mg/dL — ABNORMAL HIGH (ref 70–99)
Potassium: 3.4 mmol/L — ABNORMAL LOW (ref 3.5–5.1)
Sodium: 136 mmol/L (ref 135–145)
Total Bilirubin: 1.3 mg/dL — ABNORMAL HIGH (ref 0.3–1.2)
Total Protein: 7.5 g/dL (ref 6.5–8.1)

## 2021-08-14 LAB — URINALYSIS, ROUTINE W REFLEX MICROSCOPIC
Bilirubin Urine: NEGATIVE
Glucose, UA: NEGATIVE mg/dL
Ketones, ur: NEGATIVE mg/dL
Leukocytes,Ua: NEGATIVE
Nitrite: NEGATIVE
Protein, ur: NEGATIVE mg/dL
Specific Gravity, Urine: 1.01 (ref 1.005–1.030)
pH: 7.5 (ref 5.0–8.0)

## 2021-08-14 LAB — URINALYSIS, MICROSCOPIC (REFLEX)

## 2021-08-14 LAB — RESP PANEL BY RT-PCR (FLU A&B, COVID) ARPGX2
Influenza A by PCR: NEGATIVE
Influenza B by PCR: NEGATIVE
SARS Coronavirus 2 by RT PCR: NEGATIVE

## 2021-08-14 LAB — CBC
HCT: 49.5 % — ABNORMAL HIGH (ref 36.0–46.0)
Hemoglobin: 16.9 g/dL — ABNORMAL HIGH (ref 12.0–15.0)
MCH: 29.3 pg (ref 26.0–34.0)
MCHC: 34.1 g/dL (ref 30.0–36.0)
MCV: 85.8 fL (ref 80.0–100.0)
Platelets: 420 10*3/uL — ABNORMAL HIGH (ref 150–400)
RBC: 5.77 MIL/uL — ABNORMAL HIGH (ref 3.87–5.11)
RDW: 13.6 % (ref 11.5–15.5)
WBC: 18.5 10*3/uL — ABNORMAL HIGH (ref 4.0–10.5)
nRBC: 0 % (ref 0.0–0.2)

## 2021-08-14 LAB — LIPASE, BLOOD: Lipase: 21 U/L (ref 11–51)

## 2021-08-14 MED ORDER — MAGNESIUM OXIDE -MG SUPPLEMENT 400 (240 MG) MG PO TABS
400.0000 mg | ORAL_TABLET | Freq: Every day | ORAL | Status: DC
  Administered 2021-08-15 – 2021-08-17 (×3): 400 mg via ORAL
  Filled 2021-08-14 (×4): qty 1

## 2021-08-14 MED ORDER — LEVOTHYROXINE SODIUM 50 MCG PO TABS
50.0000 ug | ORAL_TABLET | Freq: Every day | ORAL | Status: DC
  Administered 2021-08-15 – 2021-08-19 (×5): 50 ug via ORAL
  Filled 2021-08-14 (×6): qty 1

## 2021-08-14 MED ORDER — RIVAROXABAN 10 MG PO TABS
10.0000 mg | ORAL_TABLET | Freq: Every day | ORAL | Status: DC
  Administered 2021-08-15 – 2021-08-19 (×5): 10 mg via ORAL
  Filled 2021-08-14 (×5): qty 1

## 2021-08-14 MED ORDER — FAMOTIDINE 20 MG PO TABS
20.0000 mg | ORAL_TABLET | Freq: Every day | ORAL | Status: DC
  Administered 2021-08-15 – 2021-08-17 (×3): 20 mg via ORAL
  Filled 2021-08-14 (×3): qty 1

## 2021-08-14 MED ORDER — ASPIRIN EC 81 MG PO TBEC
81.0000 mg | DELAYED_RELEASE_TABLET | Freq: Every day | ORAL | Status: DC
  Administered 2021-08-15 – 2021-08-19 (×5): 81 mg via ORAL
  Filled 2021-08-14 (×5): qty 1

## 2021-08-14 MED ORDER — MYCOPHENOLATE MOFETIL 250 MG PO CAPS
250.0000 mg | ORAL_CAPSULE | Freq: Two times a day (BID) | ORAL | Status: DC
  Administered 2021-08-15 – 2021-08-19 (×9): 250 mg via ORAL
  Filled 2021-08-14 (×11): qty 1

## 2021-08-14 MED ORDER — POLYSACCHARIDE IRON COMPLEX 150 MG PO CAPS
150.0000 mg | ORAL_CAPSULE | Freq: Every day | ORAL | Status: DC
  Administered 2021-08-16 – 2021-08-18 (×3): 150 mg via ORAL
  Filled 2021-08-14 (×5): qty 1

## 2021-08-14 MED ORDER — LOSARTAN POTASSIUM 50 MG PO TABS
50.0000 mg | ORAL_TABLET | Freq: Two times a day (BID) | ORAL | Status: DC
  Administered 2021-08-15 – 2021-08-19 (×10): 50 mg via ORAL
  Filled 2021-08-14 (×10): qty 1

## 2021-08-14 MED ORDER — METRONIDAZOLE 500 MG/100ML IV SOLN
500.0000 mg | Freq: Two times a day (BID) | INTRAVENOUS | Status: DC
  Filled 2021-08-14: qty 100

## 2021-08-14 MED ORDER — VITAMIN B-12 100 MCG PO TABS
500.0000 ug | ORAL_TABLET | Freq: Every day | ORAL | Status: DC
  Administered 2021-08-15 – 2021-08-19 (×5): 500 ug via ORAL
  Filled 2021-08-14: qty 5
  Filled 2021-08-14: qty 1
  Filled 2021-08-14: qty 5
  Filled 2021-08-14: qty 1
  Filled 2021-08-14: qty 5

## 2021-08-14 MED ORDER — PIPERACILLIN-TAZOBACTAM 3.375 G IVPB
3.3750 g | Freq: Three times a day (TID) | INTRAVENOUS | Status: DC
Start: 2021-08-14 — End: 2021-08-18
  Administered 2021-08-15 – 2021-08-18 (×8): 3.375 g via INTRAVENOUS
  Filled 2021-08-14 (×11): qty 50

## 2021-08-14 MED ORDER — LACTATED RINGERS IV SOLN: INTRAVENOUS | Status: DC

## 2021-08-14 MED ORDER — ONDANSETRON HCL 4 MG/2ML IJ SOLN
4.0000 mg | Freq: Once | INTRAMUSCULAR | Status: AC
  Administered 2021-08-14: 23:00:00 4 mg via INTRAVENOUS
  Filled 2021-08-14: qty 2

## 2021-08-14 MED ORDER — DILTIAZEM HCL ER COATED BEADS 120 MG PO CP24
120.0000 mg | ORAL_CAPSULE | Freq: Two times a day (BID) | ORAL | Status: DC
  Administered 2021-08-15 – 2021-08-19 (×9): 120 mg via ORAL
  Filled 2021-08-14 (×13): qty 1

## 2021-08-14 MED ORDER — CIPROFLOXACIN IN D5W 400 MG/200ML IV SOLN
400.0000 mg | Freq: Once | INTRAVENOUS | Status: DC
  Administered 2021-08-14: 23:00:00 400 mg via INTRAVENOUS
  Filled 2021-08-14: qty 200

## 2021-08-14 MED ORDER — TACROLIMUS 0.5 MG PO CAPS
0.5000 mg | ORAL_CAPSULE | Freq: Two times a day (BID) | ORAL | Status: DC
  Administered 2021-08-15 – 2021-08-19 (×9): 0.5 mg via ORAL
  Filled 2021-08-14 (×11): qty 1

## 2021-08-14 MED ORDER — SODIUM CHLORIDE 0.9 % IV SOLN
12.5000 mg | Freq: Four times a day (QID) | INTRAVENOUS | Status: DC | PRN
  Filled 2021-08-14 (×4): qty 0.5

## 2021-08-14 MED ORDER — VENLAFAXINE HCL ER 75 MG PO CP24
75.0000 mg | ORAL_CAPSULE | Freq: Every day | ORAL | Status: DC
  Administered 2021-08-16 – 2021-08-19 (×4): 75 mg via ORAL
  Filled 2021-08-14 (×6): qty 1

## 2021-08-14 MED ORDER — FENTANYL CITRATE PF 50 MCG/ML IJ SOSY: 12.5000 ug | PREFILLED_SYRINGE | INTRAMUSCULAR | Status: DC | PRN

## 2021-08-14 MED ORDER — CLONAZEPAM 0.5 MG PO TABS
1.0000 mg | ORAL_TABLET | Freq: Every day | ORAL | Status: DC
  Administered 2021-08-15 – 2021-08-18 (×5): 1 mg via ORAL
  Filled 2021-08-14 (×5): qty 2

## 2021-08-14 MED ORDER — ACETAMINOPHEN 500 MG PO TABS: 500.0000 mg | ORAL_TABLET | Freq: Four times a day (QID) | ORAL | Status: DC | PRN

## 2021-08-14 MED ORDER — IOHEXOL 300 MG/ML  SOLN
80.0000 mL | Freq: Once | INTRAMUSCULAR | Status: AC | PRN
  Administered 2021-08-14: 20:00:00 80 mL via INTRAVENOUS

## 2021-08-14 MED ORDER — MORPHINE SULFATE (PF) 2 MG/ML IV SOLN
2.0000 mg | Freq: Once | INTRAVENOUS | Status: DC
  Filled 2021-08-14: qty 1

## 2021-08-14 MED ORDER — LACTATED RINGERS IV BOLUS
1000.0000 mL | Freq: Once | INTRAVENOUS | Status: AC
  Administered 2021-08-14: 23:00:00 1000 mL via INTRAVENOUS

## 2021-08-14 NOTE — ED Notes (Signed)
Admitting MD notified that pt states she is allergic to Flagyl

## 2021-08-14 NOTE — ED Provider Notes (Signed)
[facility] EMERGENCY DEPARTMENT Provider Note   CSN: [id number] Arrival date & time: 08/14/21  1507     History Chief Complaint  Patient presents with   Abdominal Pain    [name] [name] is a 80 y.o. female.  Pt c/o lower abd pain in the past 3-4 days. Symptoms acute onset, moderate,  constant, persistent, lower abd. No hx same pain. Subjective fever. +nausea/vomiting, and states zofran po not helping. No dysuria or gu c/o. Has eaten/drank very little in past couple days, and feels generally weak.   The history is provided by the patient, a relative and medical records.  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     Family History  Problem Relation Age of Onset   Heart attack Mother    Heart disease Mother     Colon cancer Father    Ulcers Father    Liver cancer Father    Colon polyps Sister    Heart attack Maternal Grandmother    Heart attack Paternal Grandmother    Diabetes type II Paternal Grandmother    OCD Daughter     Social History   Tobacco Use   Smoking status: Former   Smokeless tobacco: Never   Tobacco comments:    Quit 1960  Substance Use Topics   Alcohol use: No    Alcohol/week: 0.0 standard drinks   Drug use: No    Home Medications Prior to Admission medications   Medication Sig Start Date End Date Taking? Authorizing Provider  acetaminophen (TYLENOL) 500 MG tablet Take by mouth.    [provider]  aspirin 81 MG tablet Take 81 mg by mouth daily.    [provider]  cephALEXin (KEFLEX) 500 MG capsule Take 1 capsule (500 mg total) by mouth 4 (four) times daily. Patient not taking: No sig reported 01/07/17   Lysbeth Penner, FNP  ciprofloxacin (CIPRO) 500 MG tablet Take 1 tablet (500 mg total) by mouth every 12 (twelve) hours. Patient not taking: No sig reported 01/30/17  Junius Creamer, NP  clonazePAM (KLONOPIN) 1 MG tablet Take 1 mg by mouth at bedtime.     [provider]  conjugated estrogens (PREMARIN) vaginal cream Place 1 Applicatorful vaginally 2 (two) times a week.     [provider]  cyanocobalamin 500 MCG tablet Take 500 mcg by mouth daily.    [provider]  desvenlafaxine (PRISTIQ) 50 MG 24 hr tablet Take 50 mg by mouth daily.    [provider]  diltiazem (CARDIZEM CD) 120 MG 24 hr capsule Take 120 mg by mouth 2 (two) times daily. 07/06/16   [provider]  escitalopram (LEXAPRO) 20 MG tablet Take 20 mg by mouth daily.    [provider]  famotidine (PEPCID) 20 MG tablet Take by mouth. 11/07/19   [provider]  fluticasone furoate-vilanterol (BREO ELLIPTA) 100-25 MCG/INH AEPB Inhale into the lungs.    [provider]  Hyoscyamine Sulfate SL (LEVSIN/SL) 0.125 MG  SUBL Place 0.125 mg under the tongue every 4 (four) hours as needed (pain). Up to 1.25 mg daily Patient not taking: No sig reported 12/08/20   Margarita Mail, PA-C  iron polysaccharides (NIFEREX) 150 MG capsule Take 150 mg by mouth daily.    [provider]  levothyroxine (SYNTHROID, LEVOTHROID) 50 MCG tablet Take 50 mcg by mouth daily before breakfast.    [provider]  loperamide (IMODIUM) 2 MG capsule Take 2 mg by mouth as needed for diarrhea or loose stools.     [provider]  losartan (COZAAR) 100 MG tablet TAKE 1 TABLET BY MOUTH TWICE DAILY 12/15/19   [provider]  magnesium oxide (MAG-OX) 400 MG tablet Take 400 mg by mouth daily.    [provider]  metroNIDAZOLE (FLAGYL) 500 MG tablet Take 1 tablet (500 mg total) by mouth 2 (two) times daily. Patient not taking: No sig reported 01/30/17   Junius Creamer, NP  Multiple Vitamins-Minerals (MULTIVITAMIN PO) Take 1 tablet by mouth daily.    [provider]  mycophenolate (CELLCEPT) 250 MG capsule Take 250 mg by mouth 2 (two) times daily.     [provider]  phenazopyridine (PYRIDIUM) 200 MG tablet Take 1 tablet (200 mg total) by mouth 3 (three) times daily. Patient not taking: No sig reported 01/07/17   Lysbeth Penner, FNP  Probiotic Product (ALIGN) CHEW Chew by mouth.    [provider]  rivaroxaban (XARELTO) 20 MG TABS tablet Take 20 mg by mouth daily with supper.    [provider]  simethicone (GAS-X) 80 MG chewable tablet Chew 1 tablet (80 mg total) by mouth every 6 (six) hours as needed for flatulence. 12/08/20   Margarita Mail, PA-C  tacrolimus (PROGRAF) 0.5 MG capsule Take 0.5 mg by mouth 2 (two) times daily.    [provider]  traMADol (ULTRAM) 50 MG tablet Take 1 tablet (50 mg total) by mouth every 6 (six) hours as needed. 12/29/19   Pete Pelt, PA-C  vancomycin (VANCOCIN) 50 mg/mL oral solution Take 2.5 mLs (125 mg total) by mouth every  6 (six) hours. For 11 days Patient not taking: Reported on 05/24/2021 07/22/16   Caren Griffins, MD  Vitamin D, Ergocalciferol, (DRISDOL) 50000 UNITS CAPS capsule Take 50,000 Units by mouth every Friday.     [provider]    Allergies    Antihistamines, chlorpheniramine-type; Augmentin [amoxicillin-pot clavulanate]; Cyclobenzaprine; Valtrex [valacyclovir]; and Codeine  Review of Systems   Review of Systems  Constitutional:  Positive  for fever.  HENT:  Negative for sore throat.   Eyes:  Negative for redness.  Respiratory:  Negative for cough and shortness of breath.   Cardiovascular:  Negative for chest pain.  Gastrointestinal:  Positive for abdominal pain and nausea.  Genitourinary:  Negative for dysuria and flank pain.  Musculoskeletal:  Negative for neck pain.  Skin:  Negative for rash.  Neurological:  Negative for headaches.  Hematological:  Does not bruise/bleed easily.  Psychiatric/Behavioral:  Negative for confusion.    Physical Exam Updated Vital Signs BP (!) 176/74    Pulse (!) 103    Temp 98.6 F (37 C) (Oral)    Resp 16    SpO2 98%   Physical Exam Vitals and nursing note reviewed.  Constitutional:      Appearance: Normal appearance. She is well-developed.  HENT:     Head: Atraumatic.     Nose: Nose normal.     Mouth/Throat:     Mouth: Mucous membranes are moist.  Eyes:     General: No scleral icterus.    Conjunctiva/sclera: Conjunctivae normal.  Neck:     Trachea: No tracheal deviation.  Cardiovascular:     Rate and Rhythm: Normal rate and regular rhythm.     Pulses: Normal pulses.     Heart sounds: Normal heart sounds. No murmur heard.   No friction rub. No gallop.  Pulmonary:     Effort: Pulmonary effort is normal. No respiratory distress.     Breath sounds: Normal breath sounds.  Abdominal:     General: Bowel sounds are normal. There is no distension.     Palpations: Abdomen is soft.     Tenderness: There is abdominal tenderness. There is  no guarding or rebound.     Comments: Moderate lower abd tenderness.  Genitourinary:    Comments: No cva tenderness.  Musculoskeletal:        General: No swelling.     Cervical back: Normal range of motion and neck supple. No rigidity. No muscular tenderness.  Skin:    General: Skin is warm and dry.     Findings: No rash.  Neurological:     Mental Status: She is alert.     Comments: Alert, speech normal.   Psychiatric:        Mood and Affect: Mood normal.    ED Results / Procedures / Treatments   Labs (all labs ordered are listed, but only abnormal results are displayed) Results for orders placed or performed during the hospital encounter of 08/14/21  Lipase, blood  Result Value Ref Range   Lipase 21 11 - 51 U/L  Comprehensive metabolic panel  Result Value Ref Range   Sodium 136 135 - 145 mmol/L   Potassium 3.4 (L) 3.5 - 5.1 mmol/L   Chloride 103 98 - 111 mmol/L   CO2 21 (L) 22 - 32 mmol/L   Glucose, Bld 143 (H) 70 - 99 mg/dL   BUN 10 8 - 23 mg/dL   Creatinine, Ser 1.13 (H) 0.44 - 1.00 mg/dL   Calcium 9.5 8.9 - 10.3 mg/dL   Total Protein 7.5 6.5 - 8.1 g/dL   Albumin 3.5 3.5 - 5.0 g/dL   AST 23 15 - 41 U/L   ALT 15 0 - 44 U/L   Alkaline Phosphatase 118 38 - 126 U/L   Total Bilirubin 1.3 (H) 0.3 - 1.2 mg/dL   GFR, Estimated 49 (L) >60 mL/min   Anion gap 12 5 - 15  CBC  Result Value Ref Range   WBC 18.5 (H) 4.0 - 10.5 K/uL   RBC 5.77 (H) 3.87 - 5.11 MIL/uL   Hemoglobin 16.9 (H) 12.0 - 15.0 g/dL   HCT 49.5 (H) 36.0 - 46.0 %   MCV 85.8 80.0 - 100.0 fL   MCH 29.3 26.0 - 34.0 pg   MCHC 34.1 30.0 - 36.0 g/dL   RDW 13.6 11.5 - 15.5 %   Platelets 420 (H) 150 - 400 K/uL   nRBC 0.0 0.0 - 0.2 %  Urinalysis, Routine w reflex microscopic  Result Value Ref Range   Color, Urine YELLOW YELLOW   APPearance CLEAR CLEAR   Specific Gravity, Urine 1.010 1.005 - 1.030   pH 7.5 5.0 - 8.0   Glucose, UA NEGATIVE NEGATIVE mg/dL   Hgb urine dipstick TRACE (A) NEGATIVE   Bilirubin  Urine NEGATIVE NEGATIVE   Ketones, ur NEGATIVE NEGATIVE mg/dL   Protein, ur NEGATIVE NEGATIVE mg/dL   Nitrite NEGATIVE NEGATIVE   Leukocytes,Ua NEGATIVE NEGATIVE  Urinalysis, Microscopic (reflex)  Result Value Ref Range   RBC / HPF 0-5 0 - 5 RBC/hpf   WBC, UA 0-5 0 - 5 WBC/hpf   Bacteria, UA RARE (A) NONE SEEN   Squamous Epithelial / LPF 0-5 0 - 5   DG Chest 2 View  Result Date: 07/18/2021 CLINICAL DATA:  Chest pain.  Dizziness and fatigue. EXAM: CHEST - 2 VIEW COMPARISON:  Radiograph 05/24/2021. FINDINGS: Post median sternotomy. Stable heart size and mediastinal contours. Retrocardiac hiatal hernia. Chronic eventration of right hemidiaphragm. Minor adjacent scarring/atelectasis. No confluent consolidation, pleural effusion, or pneumothorax. Stable osseous structures. IMPRESSION: 1. No acute chest findings. 2. Retrocardiac hiatal hernia. Electronically Signed   By: Keith Rake M.D.   On: 07/18/2021 23:00   CT ABDOMEN PELVIS W CONTRAST  Result Date: 08/14/2021 CLINICAL DATA:  Lower abdominal pain and nausea. EXAM: CT ABDOMEN AND PELVIS WITH CONTRAST TECHNIQUE: Multidetector CT imaging of the abdomen and pelvis was performed using the standard protocol following bolus administration of intravenous contrast. CONTRAST:  56mL OMNIPAQUE IOHEXOL 300 MG/ML  SOLN COMPARISON:  None. FINDINGS: Lower chest: A stable 1.1 cm slightly nodular appearing area of linear scarring is seen within the right lung base. Hepatobiliary: No focal liver abnormality is seen. No gallstones, gallbladder wall thickening, or biliary dilatation. Pancreas: Unremarkable. No pancreatic ductal dilatation or surrounding inflammatory changes. Spleen: Normal in size without focal abnormality. Adrenals/Urinary Tract: Adrenal glands are unremarkable. Kidneys are normal in size, without renal calculi or hydronephrosis. Subcentimeter cystic appearing areas are seen within the upper pole of the right kidney and posterior aspect of the  mid left kidney. Bladder is unremarkable. Stomach/Bowel: There is a moderate sized hiatal hernia. The appendix is surgically absent. No evidence of bowel dilatation. Numerous diverticula are seen throughout the large bowel. Mild to moderate severity thickening of the proximal to mid sigmoid colon is also seen. Vascular/Lymphatic: No significant vascular findings are present. No enlarged abdominal or pelvic lymph nodes. Reproductive: Status post hysterectomy. No adnexal masses. Other: No abdominal wall hernia or abnormality. No abdominopelvic ascites. Musculoskeletal: No acute or significant osseous findings. IMPRESSION: 1. Colonic diverticulosis with mild to moderate severity sigmoid diverticulitis. 2. Moderate sized hiatal hernia. Electronically Signed   By: Virgina Norfolk M.D.   On: 08/14/2021 20:45     EKG None  Radiology CT ABDOMEN PELVIS W CONTRAST  Result Date: 08/14/2021 CLINICAL DATA:  Lower abdominal pain and nausea. EXAM: CT ABDOMEN AND PELVIS WITH CONTRAST TECHNIQUE: Multidetector CT  imaging of the abdomen and pelvis was performed using the standard protocol following bolus administration of intravenous contrast. CONTRAST:  34mL OMNIPAQUE IOHEXOL 300 MG/ML  SOLN COMPARISON:  None. FINDINGS: Lower chest: A stable 1.1 cm slightly nodular appearing area of linear scarring is seen within the right lung base. Hepatobiliary: No focal liver abnormality is seen. No gallstones, gallbladder wall thickening, or biliary dilatation. Pancreas: Unremarkable. No pancreatic ductal dilatation or surrounding inflammatory changes. Spleen: Normal in size without focal abnormality. Adrenals/Urinary Tract: Adrenal glands are unremarkable. Kidneys are normal in size, without renal calculi or hydronephrosis. Subcentimeter cystic appearing areas are seen within the upper pole of the right kidney and posterior aspect of the mid left kidney. Bladder is unremarkable. Stomach/Bowel: There is a moderate sized hiatal  hernia. The appendix is surgically absent. No evidence of bowel dilatation. Numerous diverticula are seen throughout the large bowel. Mild to moderate severity thickening of the proximal to mid sigmoid colon is also seen. Vascular/Lymphatic: No significant vascular findings are present. No enlarged abdominal or pelvic lymph nodes. Reproductive: Status post hysterectomy. No adnexal masses. Other: No abdominal wall hernia or abnormality. No abdominopelvic ascites. Musculoskeletal: No acute or significant osseous findings. IMPRESSION: 1. Colonic diverticulosis with mild to moderate severity sigmoid diverticulitis. 2. Moderate sized hiatal hernia. Electronically Signed   By: Aram Candela M.D.   On: 08/14/2021 20:45    Procedures Procedures   Medications Ordered in ED Medications  ciprofloxacin (CIPRO) IVPB 400 mg (has no administration in time range)  metroNIDAZOLE (FLAGYL) IVPB 500 mg (has no administration in time range)  lactated ringers bolus 1,000 mL (has no administration in time range)  morphine 2 MG/ML injection 2 mg (has no administration in time range)  ondansetron (ZOFRAN) injection 4 mg (has no administration in time range)  iohexol (OMNIPAQUE) 300 MG/ML solution 80 mL (80 mLs Intravenous Contrast Given 08/14/21 1934)    ED Course  I have reviewed the triage vital signs and the nursing notes.  Pertinent labs & imaging results that were available during my care of the patient were reviewed by me and considered in my medical decision making (see chart for details).    MDM Rules/Calculators/A&P                         IV Lr bolus. Zofran iv. Morphine iv.  Labs sent.  Reviewed nursing notes and prior charts for additional history.   Labs reviewed/interpreted by me - wbc elevated.   CT reviewed/interpreted by md - mod diverticulitis.   Iv antibiotics given.   Discussed ct w pt.   Pt/family feel too weak, nauseated, etc, to manage at home, request admission.  Hospitalist  consulted for admission.      Final Clinical Impression(s) / ED Diagnoses Final diagnoses:  None    Rx / DC Orders ED Discharge Orders     None        Cathren Laine, MD 08/14/21 2133

## 2021-08-14 NOTE — ED Triage Notes (Signed)
Patient arrived by [facility] with complaint of constipation since Thursday and rectal bleeding. Patient per EMS denies abdominal pain and alert and oriented

## 2021-08-14 NOTE — ED Triage Notes (Signed)
C/o lower abd pain, nausea, generalized weakness and rectal bleeding x 1 1/2 days.  Family states she was constipated but now able to have BM.  No vomiting.

## 2021-08-14 NOTE — Progress Notes (Signed)
Pharmacy Antibiotic Note  [name] [name] is a 80 y.o. female admitted on 08/14/2021 with  intra-abdominal infection .  Pharmacy has been consulted for Zosyn dosing. WBC elevated. Renal function ok.  Plan: Zosyn 3.375G IV q8h to be infused over 4 hours Trend WBC, temp, renal function  F/U infectious work-up  Temp (24hrs), Avg:98.6 F (37 C), Min:98.6 F (37 C), Max:98.6 F (37 C)  Recent Labs  Lab 08/14/21 1659  WBC 18.5*  CREATININE 1.13*    CrCl cannot be calculated (Unknown ideal weight.).    Allergies  Allergen Reactions   Antihistamines, Chlorpheniramine-Type Other (See Comments)    Numbness   Augmentin [Amoxicillin-Pot Clavulanate]     Nausea only   Cyclobenzaprine Other (See Comments)   Metronidazole     Unknown reaction    Valtrex [Valacyclovir]     "Made my chin numb"   Codeine Nausea Only    Abran Duke, PharmD, BCPS Clinical Pharmacist Phone: [phone]

## 2021-08-14 NOTE — ED Provider Notes (Signed)
Emergency Medicine Provider Triage Evaluation Note  [name] [name] , a 80 y.o. female  was evaluated in triage.  Pt complains of abdominal pain  Review of Systems  Positive: Blood in stool and abdominal pain  Negative: No fever  Physical Exam  BP (!) 169/82 (BP Location: Right Arm)    Pulse (!) 106    Temp 98.6 F (37 C) (Oral)    Resp 20    SpO2 97%  Gen:   Awake, no distress   Resp:  Normal effort  MSK:   Moves extremities without difficulty  Other: Abdomen diffusely tender    Medical Decision Making  Medically screening exam initiated at 5:03 PM.  Appropriate orders placed.  Janaiya Jerlyn Pain was informed that the remainder of the evaluation will be completed by another provider, this initial triage assessment does not replace that evaluation, and the importance of remaining in the ED until their evaluation is complete.     Elson Areas, [place] 08/14/21 1704    Vanetta Mulders, MD 08/17/21 1332

## 2021-08-14 NOTE — H&P (Signed)
History and Physical    Kasmira Cacioppo QIO:[id number] DOB: Aug 26, 1941 DOA: 08/14/2021  PCP: Cleatis Polka., MD  Patient coming from: Home  I have personally briefly reviewed patient's old medical records in [facility]  Chief Complaint: diarrhea and abdominal pain  HPI: [name] [name] is a 80 y.o. female with medical history significant for heart transplant in 2009, OSA not CPAP, recurrent DVT on Xarelto, hypertension, hypothyroidism, parathyroidectomy, history of C. difficile who presents with concerns of diarrhea and abdominal pain.  About 4 days ago she began to note diarrhea and tried to take Imodium without relief.  She normally deals with alternating bouts of constipation and diarrhea due to taking magnesium supplementation.  She has felt chills but no objective fever. Also had nausea and unable to eat for 4 days. She became more concerned when she started to note left-sided abdominal pain and bloody diarrhea yesterday. Denies recent antibiotic use. Still has been able to keep down her immunosuppressive medications.  ED Course: She was afebrile, hypertensive with systolic of 160 over 80s on room air.  She was tachycardic with heart rate of 105, RR 22.  WBC of 18.5, hemoglobin of 16.9 Sodium of 136, K of 3.4, creatinine of 1.13 up from 1.07, BG of 143  CT of the abdomen shows mild to moderate severity sigmoid diverticulitis.  IV Cipro and Flagyl was ordered and hospitalist called for admission.  Review of Systems: Constitutional: No Weight Change, No Fever ENT/Mouth: No sore throat, No Rhinorrhea Eyes: No Eye Pain, No Vision Changes Cardiovascular: No Chest Pain, no SOB Respiratory: No Cough, No Sputum Gastrointestinal: + Nausea, no Vomiting, No Diarrhea, No Constipation, + Pain Genitourinary: no Urinary Incontinence Musculoskeletal: No Arthralgias, No Myalgias Skin: No Skin Lesions, No Pruritus, Neuro: no Weakness, No Numbness Psych: No Anxiety/Panic,  No Depression, + decrease appetite Heme/Lymph: No Bruising, No Bleeding   Past Medical History:  Diagnosis Date   A-fib (HCC)    resolved   Anxiety    Arrhythmia    bradycardia   CHF (congestive heart failure) (HCC)    resolved   Depression    DVT (deep venous thrombosis) (HCC)    GERD (gastroesophageal reflux disease)    Heart transplanted (HCC)    Followed at [facility] - Dr. Laurence Compton   HTN (hypertension)    Hyperparathyroidism [facility])    resolved   Osteoporosis    Pacemaker    resolved    Past Surgical History:  Procedure Laterality Date   ABDOMINAL HYSTERECTOMY     ABLATION  2007   ABLATION ON ENDOMETRIOSIS     APPENDECTOMY     BREAST EXCISIONAL BIOPSY Left 1986   benign   CARDIAC CATHETERIZATION  2009   HEART TRANSPLANT  2009   HEART TRANSPLANT     NECK SURGERY     removal of parathyroid   PACEMAKER INSERTION  2005   TOTAL ABDOMINAL HYSTERECTOMY W/ BILATERAL SALPINGOOPHORECTOMY       reports that she has quit smoking. She has never used smokeless tobacco. She reports that she does not drink alcohol and does not use drugs. Social History  Allergies  Allergen Reactions   Antihistamines, Chlorpheniramine-Type Other (See Comments)    Numbness   Augmentin [Amoxicillin-Pot Clavulanate]     Nausea only   Cyclobenzaprine Other (See Comments)   Metronidazole     Unknown reaction    Valtrex [Valacyclovir]     "Made my chin numb"   Codeine Nausea Only  Family History  Problem Relation Age of Onset   Heart attack Mother    Heart disease Mother    Colon cancer Father    Ulcers Father    Liver cancer Father    Colon polyps Sister    Heart attack Maternal Grandmother    Heart attack Paternal Grandmother    Diabetes type II Paternal Grandmother    OCD Daughter      Prior to Admission medications   Medication Sig Start Date End Date Taking? Authorizing Provider  acetaminophen (TYLENOL) 500 MG tablet Take by mouth.    [provider]   aspirin 81 MG tablet Take 81 mg by mouth daily.    [provider]  cephALEXin (KEFLEX) 500 MG capsule Take 1 capsule (500 mg total) by mouth 4 (four) times daily. Patient not taking: No sig reported 01/07/17   Deatra Canter, FNP  ciprofloxacin (CIPRO) 500 MG tablet Take 1 tablet (500 mg total) by mouth every 12 (twelve) hours. Patient not taking: No sig reported 01/30/17   Earley Favor, NP  clonazePAM (KLONOPIN) 1 MG tablet Take 1 mg by mouth at bedtime.     [provider]  conjugated estrogens (PREMARIN) vaginal cream Place 1 Applicatorful vaginally 2 (two) times a week.     [provider]  cyanocobalamin 500 MCG tablet Take 500 mcg by mouth daily.    [provider]  desvenlafaxine (PRISTIQ) 50 MG 24 hr tablet Take 50 mg by mouth daily.    [provider]  diltiazem (CARDIZEM CD) 120 MG 24 hr capsule Take 120 mg by mouth 2 (two) times daily. 07/06/16   [provider]  escitalopram (LEXAPRO) 20 MG tablet Take 20 mg by mouth daily.    [provider]  famotidine (PEPCID) 20 MG tablet Take by mouth. 11/07/19   [provider]  fluticasone furoate-vilanterol (BREO ELLIPTA) 100-25 MCG/INH AEPB Inhale into the lungs.    [provider]  Hyoscyamine Sulfate SL (LEVSIN/SL) 0.125 MG SUBL Place 0.125 mg under the tongue every 4 (four) hours as needed (pain). Up to 1.25 mg daily Patient not taking: No sig reported 12/08/20   Arthor Captain, PA-C  iron polysaccharides (NIFEREX) 150 MG capsule Take 150 mg by mouth daily.    [provider]  levothyroxine (SYNTHROID, LEVOTHROID) 50 MCG tablet Take 50 mcg by mouth daily before breakfast.    [provider]  loperamide (IMODIUM) 2 MG capsule Take 2 mg by mouth as needed for diarrhea or loose stools.     [provider]  losartan (COZAAR) 100 MG tablet TAKE 1 TABLET BY MOUTH TWICE DAILY 12/15/19   [provider]  magnesium oxide (MAG-OX) 400  MG tablet Take 400 mg by mouth daily.    [provider]  metroNIDAZOLE (FLAGYL) 500 MG tablet Take 1 tablet (500 mg total) by mouth 2 (two) times daily. Patient not taking: No sig reported 01/30/17   Earley Favor, NP  Multiple Vitamins-Minerals (MULTIVITAMIN PO) Take 1 tablet by mouth daily.    [provider]  mycophenolate (CELLCEPT) 250 MG capsule Take 250 mg by mouth 2 (two) times daily.     [provider]  phenazopyridine (PYRIDIUM) 200 MG tablet Take 1 tablet (200 mg total) by mouth 3 (three) times daily. Patient not taking: No sig reported 01/07/17   Deatra Canter, FNP  Probiotic Product (ALIGN) CHEW Chew by mouth.    [provider]  rivaroxaban (XARELTO) 20 MG TABS tablet  Take 20 mg by mouth daily with supper.    [provider]  simethicone (GAS-X) 80 MG chewable tablet Chew 1 tablet (80 mg total) by mouth every 6 (six) hours as needed for flatulence. 12/08/20   Arthor Captain, PA-C  tacrolimus (PROGRAF) 0.5 MG capsule Take 0.5 mg by mouth 2 (two) times daily.    [provider]  traMADol (ULTRAM) 50 MG tablet Take 1 tablet (50 mg total) by mouth every 6 (six) hours as needed. 12/29/19   Kirtland Bouchard, PA-C  vancomycin (VANCOCIN) 50 mg/mL oral solution Take 2.5 mLs (125 mg total) by mouth every 6 (six) hours. For 11 days Patient not taking: Reported on 05/24/2021 07/22/16   Leatha Gilding, MD  Vitamin D, Ergocalciferol, (DRISDOL) 50000 UNITS CAPS capsule Take 50,000 Units by mouth every Friday.     [provider]    Physical Exam: Vitals:   08/14/21 1811 08/14/21 1950 08/14/21 2135 08/14/21 2215  BP: (!) 158/84 (!) 176/74 (!) 158/80 (!) 160/134  Pulse: (!) 111 (!) 103 (!) 105 100  Resp: (!) 22 16 (!) 22 (!) 25  Temp:      TempSrc:      SpO2: 97% 98% 97% 97%    Constitutional: NAD, calm, comfortable, nontoxic appearing elderly female laying flat in bed Vitals:   08/14/21 1811 08/14/21 1950 08/14/21 2135  08/14/21 2215  BP: (!) 158/84 (!) 176/74 (!) 158/80 (!) 160/134  Pulse: (!) 111 (!) 103 (!) 105 100  Resp: (!) 22 16 (!) 22 (!) 25  Temp:      TempSrc:      SpO2: 97% 98% 97% 97%   Eyes: lids and conjunctivae normal ENMT: Mucous membranes are moist.  Neck: normal, supple Respiratory: clear to auscultation bilaterally, no wheezing, no crackles. Normal respiratory effort.  Cardiovascular: Regular rate and rhythm, no murmurs / rubs / gallops. No extremity edema.  Abdomen: Soft, nondistended, mild tenderness with palpation of the left lower quadrant.  No rebound tenderness or guarding or rigidity. Musculoskeletal: no clubbing / cyanosis. No joint deformity upper and lower extremities. Good ROM, no contractures. Normal muscle tone.  Skin: no rashes, lesions, ulcers. No induration Neurologic: CN 2-12 grossly intact.  Strength 5/5 in all 4.  Psychiatric: Normal judgment and insight. Alert and oriented x 3. Normal mood.     Labs on Admission: I have personally reviewed following labs and imaging studies  CBC: Recent Labs  Lab 08/14/21 1659  WBC 18.5*  HGB 16.9*  HCT 49.5*  MCV 85.8  PLT 420*   Basic Metabolic Panel: Recent Labs  Lab 08/14/21 1659  NA 136  K 3.4*  CL 103  CO2 21*  GLUCOSE 143*  BUN 10  CREATININE 1.13*  CALCIUM 9.5   GFR: CrCl cannot be calculated (Unknown ideal weight.). Liver Function Tests: Recent Labs  Lab 08/14/21 1659  AST 23  ALT 15  ALKPHOS 118  BILITOT 1.3*  PROT 7.5  ALBUMIN 3.5   Recent Labs  Lab 08/14/21 1659  LIPASE 21   No results for input(s): AMMONIA in the last 168 hours. Coagulation Profile: No results for input(s): INR, PROTIME in the last 168 hours. Cardiac Enzymes: No results for input(s): CKTOTAL, CKMB, CKMBINDEX, TROPONINI in the last 168 hours. BNP (last 3 results) No results for input(s): PROBNP in the last 8760 hours. HbA1C: No results for input(s): HGBA1C in the last 72 hours. CBG: No results for input(s):  GLUCAP in the last 168 hours. Lipid Profile:  No results for input(s): CHOL, HDL, LDLCALC, TRIG, CHOLHDL, LDLDIRECT in the last 72 hours. Thyroid Function Tests: No results for input(s): TSH, T4TOTAL, FREET4, T3FREE, THYROIDAB in the last 72 hours. Anemia Panel: No results for input(s): VITAMINB12, FOLATE, FERRITIN, TIBC, IRON, RETICCTPCT in the last 72 hours. Urine analysis:    Component Value Date/Time   COLORURINE YELLOW 08/14/2021 1658   APPEARANCEUR CLEAR 08/14/2021 1658   LABSPEC 1.010 08/14/2021 1658   PHURINE 7.5 08/14/2021 1658   GLUCOSEU NEGATIVE 08/14/2021 1658   HGBUR TRACE (A) 08/14/2021 1658   BILIRUBINUR NEGATIVE 08/14/2021 1658   KETONESUR NEGATIVE 08/14/2021 1658   PROTEINUR NEGATIVE 08/14/2021 1658   UROBILINOGEN 0.2 01/07/2017 1655   NITRITE NEGATIVE 08/14/2021 1658   LEUKOCYTESUR NEGATIVE 08/14/2021 1658    Radiological Exams on Admission: CT ABDOMEN PELVIS W CONTRAST  Result Date: 08/14/2021 CLINICAL DATA:  Lower abdominal pain and nausea. EXAM: CT ABDOMEN AND PELVIS WITH CONTRAST TECHNIQUE: Multidetector CT imaging of the abdomen and pelvis was performed using the standard protocol following bolus administration of intravenous contrast. CONTRAST:  11mL OMNIPAQUE IOHEXOL 300 MG/ML  SOLN COMPARISON:  None. FINDINGS: Lower chest: A stable 1.1 cm slightly nodular appearing area of linear scarring is seen within the right lung base. Hepatobiliary: No focal liver abnormality is seen. No gallstones, gallbladder wall thickening, or biliary dilatation. Pancreas: Unremarkable. No pancreatic ductal dilatation or surrounding inflammatory changes. Spleen: Normal in size without focal abnormality. Adrenals/Urinary Tract: Adrenal glands are unremarkable. Kidneys are normal in size, without renal calculi or hydronephrosis. Subcentimeter cystic appearing areas are seen within the upper pole of the right kidney and posterior aspect of the mid left kidney. Bladder is unremarkable.  Stomach/Bowel: There is a moderate sized hiatal hernia. The appendix is surgically absent. No evidence of bowel dilatation. Numerous diverticula are seen throughout the large bowel. Mild to moderate severity thickening of the proximal to mid sigmoid colon is also seen. Vascular/Lymphatic: No significant vascular findings are present. No enlarged abdominal or pelvic lymph nodes. Reproductive: Status post hysterectomy. No adnexal masses. Other: No abdominal wall hernia or abnormality. No abdominopelvic ascites. Musculoskeletal: No acute or significant osseous findings. IMPRESSION: 1. Colonic diverticulosis with mild to moderate severity sigmoid diverticulitis. 2. Moderate sized hiatal hernia. Electronically Signed   By: Aram Candela M.D.   On: 08/14/2021 20:45      Assessment/Plan  Sepsis secondary to acute diverticulitis -Patient presented with tachycardia, tachypnea and leukocytosis with findings of mild to moderate sigmoid diverticulitis on CT abdomen -IV Cipro and Flagyl initially ordered but patient reports an unknown allergy to Flagyl.  We will switch to IV Zosyn -Start on clear liquid diet and advanced as tolerated -PRN low dose Fentanyl for pain  AKI -continuous IV fluid hydration overnight -follow creatinine tomorrow   History of heart transplant In 2019 followed at [facility] Continue CellCept and tacrolimus  Hypertension Continue losartan, diltiazem  History of recurrent DVT Continue Xarelto  Hypothyroidism Continue levothyroxine  OSA not on  CPAP  Depression Continue Pristiq  Unsure of Wellbutrin dose and pt will verify with husband tomorrow  DVT prophylaxis:.Lovenox Code Status: Full Family Communication: Plan discussed with patient and daughter at bedside  disposition Plan: Home with observation Consults called:  Admission status: Observation  Level of care: Telemetry Medical  Status is: Observation  The patient remains OBS appropriate and will d/c  before 2 midnights.        Anselm Jungling DO [facility]   If 7PM-7AM, please contact night-coverage [email or web address]  08/14/2021, 11:20 PM

## 2021-08-15 DIAGNOSIS — F32A Depression, unspecified: Secondary | ICD-10-CM | POA: Diagnosis not present

## 2021-08-15 DIAGNOSIS — D849 Immunodeficiency, unspecified: Secondary | ICD-10-CM | POA: Diagnosis not present

## 2021-08-15 DIAGNOSIS — K5792 Diverticulitis of intestine, part unspecified, without perforation or abscess without bleeding: Secondary | ICD-10-CM

## 2021-08-15 DIAGNOSIS — Z8249 Family history of ischemic heart disease and other diseases of the circulatory system: Secondary | ICD-10-CM | POA: Diagnosis not present

## 2021-08-15 DIAGNOSIS — Z87891 Personal history of nicotine dependence: Secondary | ICD-10-CM | POA: Diagnosis not present

## 2021-08-15 DIAGNOSIS — A419 Sepsis, unspecified organism: Secondary | ICD-10-CM | POA: Diagnosis not present

## 2021-08-15 DIAGNOSIS — D84821 Immunodeficiency due to drugs: Secondary | ICD-10-CM | POA: Diagnosis not present

## 2021-08-15 DIAGNOSIS — E860 Dehydration: Secondary | ICD-10-CM | POA: Diagnosis not present

## 2021-08-15 DIAGNOSIS — K219 Gastro-esophageal reflux disease without esophagitis: Secondary | ICD-10-CM | POA: Diagnosis not present

## 2021-08-15 DIAGNOSIS — Z86718 Personal history of other venous thrombosis and embolism: Secondary | ICD-10-CM | POA: Diagnosis not present

## 2021-08-15 DIAGNOSIS — K449 Diaphragmatic hernia without obstruction or gangrene: Secondary | ICD-10-CM | POA: Diagnosis not present

## 2021-08-15 DIAGNOSIS — I129 Hypertensive chronic kidney disease with stage 1 through stage 4 chronic kidney disease, or unspecified chronic kidney disease: Secondary | ICD-10-CM | POA: Diagnosis not present

## 2021-08-15 DIAGNOSIS — N1831 Chronic kidney disease, stage 3a: Secondary | ICD-10-CM | POA: Diagnosis not present

## 2021-08-15 DIAGNOSIS — K5900 Constipation, unspecified: Secondary | ICD-10-CM | POA: Diagnosis not present

## 2021-08-15 DIAGNOSIS — N179 Acute kidney failure, unspecified: Secondary | ICD-10-CM | POA: Diagnosis not present

## 2021-08-15 DIAGNOSIS — M810 Age-related osteoporosis without current pathological fracture: Secondary | ICD-10-CM | POA: Diagnosis not present

## 2021-08-15 DIAGNOSIS — Z941 Heart transplant status: Secondary | ICD-10-CM | POA: Diagnosis not present

## 2021-08-15 DIAGNOSIS — F419 Anxiety disorder, unspecified: Secondary | ICD-10-CM | POA: Diagnosis not present

## 2021-08-15 DIAGNOSIS — G4733 Obstructive sleep apnea (adult) (pediatric): Secondary | ICD-10-CM | POA: Diagnosis not present

## 2021-08-15 DIAGNOSIS — Z20822 Contact with and (suspected) exposure to covid-19: Secondary | ICD-10-CM | POA: Diagnosis not present

## 2021-08-15 DIAGNOSIS — E039 Hypothyroidism, unspecified: Secondary | ICD-10-CM

## 2021-08-15 DIAGNOSIS — E876 Hypokalemia: Secondary | ICD-10-CM | POA: Diagnosis not present

## 2021-08-15 DIAGNOSIS — K5732 Diverticulitis of large intestine without perforation or abscess without bleeding: Secondary | ICD-10-CM | POA: Diagnosis not present

## 2021-08-15 DIAGNOSIS — Z90710 Acquired absence of both cervix and uterus: Secondary | ICD-10-CM | POA: Diagnosis not present

## 2021-08-15 DIAGNOSIS — R531 Weakness: Secondary | ICD-10-CM | POA: Diagnosis present

## 2021-08-15 DIAGNOSIS — Z7960 Long term (current) use of unspecified immunomodulators and immunosuppressants: Secondary | ICD-10-CM | POA: Diagnosis not present

## 2021-08-15 LAB — CBC
HCT: 47.2 % — ABNORMAL HIGH (ref 36.0–46.0)
Hemoglobin: 15.3 g/dL — ABNORMAL HIGH (ref 12.0–15.0)
MCH: 29.2 pg (ref 26.0–34.0)
MCHC: 32.4 g/dL (ref 30.0–36.0)
MCV: 90.1 fL (ref 80.0–100.0)
Platelets: 338 10*3/uL (ref 150–400)
RBC: 5.24 MIL/uL — ABNORMAL HIGH (ref 3.87–5.11)
RDW: 13.8 % (ref 11.5–15.5)
WBC: 16.7 10*3/uL — ABNORMAL HIGH (ref 4.0–10.5)
nRBC: 0 % (ref 0.0–0.2)

## 2021-08-15 LAB — BASIC METABOLIC PANEL
Anion gap: 10 (ref 5–15)
BUN: 10 mg/dL (ref 8–23)
CO2: 22 mmol/L (ref 22–32)
Calcium: 9 mg/dL (ref 8.9–10.3)
Chloride: 106 mmol/L (ref 98–111)
Creatinine, Ser: 1.11 mg/dL — ABNORMAL HIGH (ref 0.44–1.00)
GFR, Estimated: 50 mL/min — ABNORMAL LOW (ref >60)
Glucose, Bld: 118 mg/dL — ABNORMAL HIGH (ref 70–99)
Potassium: 3.4 mmol/L — ABNORMAL LOW (ref 3.5–5.1)
Sodium: 138 mmol/L (ref 135–145)

## 2021-08-15 MED ORDER — POTASSIUM CHLORIDE CRYS ER 20 MEQ PO TBCR
40.0000 meq | EXTENDED_RELEASE_TABLET | Freq: Once | ORAL | Status: AC
  Administered 2021-08-15: 07:00:00 40 meq via ORAL
  Filled 2021-08-15: qty 2

## 2021-08-15 MED ORDER — LACTATED RINGERS IV SOLN: INTRAVENOUS | Status: DC

## 2021-08-15 NOTE — ED Notes (Signed)
Breakfast orders placed 

## 2021-08-15 NOTE — Progress Notes (Signed)
PROGRESS NOTE   [name] [name]  DPO:[id number]    DOB: [birth year]    DOA: 08/14/2021  PCP: Ginger Organ., MD   I have briefly reviewed patients previous medical records in [facility].  Chief Complaint  Patient presents with   Abdominal Pain    Brief Narrative:  80 year old female, medical history significant for heart transplant in 2009, on immunosuppressants, followed by Dr. Kirtland Bouchard, Cardiology at [facility], OSA not on CPAP, recurrent DVT on Xarelto, hypertension, hypothyroidism, parathyroidectomy, history of C. difficile infection, anxiety and depression, presented to ED with 4 to 5 days history of initial diarrhea, resolved after Imodium, then followed by constipation, progressive lower abdominal pain, decreased appetite, nausea without vomiting and mild rectal bleeding 48 hours prior to admission.  Admitted for sepsis due to acute sigmoid diverticulitis.   Assessment & Plan:  Principal Problem:   Sepsis ([place]) Active Problems:   Depression   Heart transplanted ([place])   HTN (hypertension)   AKI (acute kidney injury) ([place])   Diverticulitis   History of DVT (deep vein thrombosis)   Hypothyroidism   Sepsis, POA secondary to acute sigmoid diverticulitis: Met sepsis criteria on admission.  She presented with tachypnea, tachycardia, hypertension, leukocytosis and a source.  CT abdomen showed colonic diverticulosis with mild to moderate severity sigmoid diverticulitis.  Treated supportively with bowel rest/full liquids and advance as tolerated, briefly on IV Cipro and Flagyl but due to unknown allergy to Flagyl, switched to IV Zosyn, pain management.  Reports colonoscopy approximately 2 years ago with Dr. Cristina Gong (retired).  Low residue diet for a couple of weeks at discharge.  Improving.  Minimal, self-limiting rectal bleed x1-May be related to hemorrhoids.  Monitor closely for recurrence while on anticoagulation for DVT.  Dehydration: Secondary to poor oral  intake over the last several days PTA.  Continue gentle IV fluids.  Although AKI is mentioned on H&P, she did not have AKI and AKI is ruled out.  Leukocytosis: Secondary to diverticulitis.  Trend daily CBC.  Essential hypertension: Reasonable control.  Continue diltiazem and losartan.  Hypokalemia: Replace and follow.  Check and replace magnesium as needed.  History of heart transplant: Follows with Dr. Kirtland Bouchard at [facility].  Continue CellCept and tacrolimus.  History of recurrent DVT: Continue Xarelto.  Monitor closely for recurrent rectal bleeding.  Hypothyroidism: Continue levothyroxine.  OSA: Not on CPAP  Anxiety and depression: Continue clonazepam and venlafaxine.  Moderate hiatal hernia/GERD: Continue H2 blockers.  There is no height or weight on file to calculate BMI.    DVT prophylaxis: rivaroxaban (XARELTO) tablet 10 mg Start: 08/15/21 1000     Code Status: Full Code Family Communication: None at bedside. Disposition:  Status is: Observation  The patient will require care spanning > 2 midnights and should be moved to inpatient because: Severity of presentation, need for IV antibiotics due to sigmoid diverticulitis in an immunocompromised patient.       Consultants:   None  Procedures:   None  Antimicrobials:   IV Cipro x1. IV Zosyn 12/18 >   Subjective:  Feels better.  Lower abdominal pain significantly improved and almost resolved.  Nausea is better.  Appetite coming back.  Last BM was approximately 3 days ago-constipated with minimal rectal bleed but no BM since.  Objective:   Vitals:   08/14/21 2215 08/15/21 0045 08/15/21 0500 08/15/21 0515  BP: (!) 160/134 (!) 168/73 (!) 145/65 (!) 146/74  Pulse: 100 89 92 88  Resp: Marland Kitchen)  25 (!) 22 20 (!) 24  Temp:   98.6 F (37 C)   TempSrc:   Oral   SpO2: 97% 97% 98% 97%    General exam: Elderly female, moderately built and nourished lying comfortably propped up in bed without  distress.  Oral mucosa with borderline hydration. Respiratory system: Clear to auscultation. Respiratory effort normal. Cardiovascular system: S1 & S2 heard, RRR. No JVD, murmurs, rubs, gallops or clicks. No pedal edema. Gastrointestinal system: Abdomen is nondistended, soft.  Mild left lower quadrant and left mid quadrant tenderness without guarding, rigidity or rebound. No organomegaly or masses felt. Normal bowel sounds heard. Central nervous system: Alert and oriented. No focal neurological deficits. Extremities: Symmetric 5 x 5 power. Skin: No rashes, lesions or ulcers Psychiatry: Judgement and insight appear normal. Mood & affect appropriate.     Data Reviewed:   I have personally reviewed following labs and imaging studies   CBC: Recent Labs  Lab 08/14/21 1659 08/15/21 0327  WBC 18.5* 16.7*  HGB 16.9* 15.3*  HCT 49.5* 47.2*  MCV 85.8 90.1  PLT 420* 774    Basic Metabolic Panel: Recent Labs  Lab 08/14/21 1659 08/15/21 0327  NA 136 138  K 3.4* 3.4*  CL 103 106  CO2 21* 22  GLUCOSE 143* 118*  BUN 10 10  CREATININE 1.13* 1.11*  CALCIUM 9.5 9.0    Liver Function Tests: Recent Labs  Lab 08/14/21 1659  AST 23  ALT 15  ALKPHOS 118  BILITOT 1.3*  PROT 7.5  ALBUMIN 3.5    CBG: No results for input(s): GLUCAP in the last 168 hours.  Microbiology Studies:   Recent Results (from the past 240 hour(s))  Resp Panel by RT-PCR (Flu A&B, Covid) Nasopharyngeal Swab     Status: None   Collection Time: 08/14/21  9:44 PM   Specimen: Nasopharyngeal Swab; Nasopharyngeal(NP) swabs in vial transport medium  Result Value Ref Range Status   SARS Coronavirus 2 by RT PCR NEGATIVE NEGATIVE Final    Comment: (NOTE) SARS-CoV-2 target nucleic acids are NOT DETECTED.  The SARS-CoV-2 RNA is generally detectable in upper respiratory specimens during the acute phase of infection. The lowest concentration of SARS-CoV-2 viral copies this assay can detect is 138 copies/mL. A  negative result does not preclude SARS-Cov-2 infection and should not be used as the sole basis for treatment or other patient management decisions. A negative result may occur with  improper specimen collection/handling, submission of specimen other than nasopharyngeal swab, presence of viral mutation(s) within the areas targeted by this assay, and inadequate number of viral copies(<138 copies/mL). A negative result must be combined with clinical observations, patient history, and epidemiological information. The expected result is Negative.  Fact Sheet for Patients:  EntrepreneurPulse.com.au  Fact Sheet for Healthcare Providers:  IncredibleEmployment.be  This test is no t yet approved or cleared by the [place] FDA and  has been authorized for detection and/or diagnosis of SARS-CoV-2 by FDA under an Emergency Use Authorization (EUA). This EUA will remain  in effect (meaning this test can be used) for the duration of the COVID-19 declaration under Section 564(b)(1) of the Act, 21 U.S.C.section 360bbb-3(b)(1), unless the authorization is terminated  or revoked sooner.       Influenza A by PCR NEGATIVE NEGATIVE Final   Influenza B by PCR NEGATIVE NEGATIVE Final    Comment: (NOTE) The Xpert Xpress SARS-CoV-2/FLU/RSV plus assay is intended as an aid in the diagnosis of influenza from Nasopharyngeal swab specimens and should  not be used as a sole basis for treatment. Nasal washings and aspirates are unacceptable for Xpert Xpress SARS-CoV-2/FLU/RSV testing.  Fact Sheet for Patients: EntrepreneurPulse.com.au  Fact Sheet for Healthcare Providers: IncredibleEmployment.be  This test is not yet approved or cleared by the [place] FDA and has been authorized for detection and/or diagnosis of SARS-CoV-2 by FDA under an Emergency Use Authorization (EUA). This EUA will remain in effect (meaning this test can  be used) for the duration of the COVID-19 declaration under Section 564(b)(1) of the Act, 21 U.S.C. section 360bbb-3(b)(1), unless the authorization is terminated or revoked.  Performed at [facility], [place] [address], [place], [place] [address]     Radiology Studies:  CT ABDOMEN PELVIS W CONTRAST  Result Date: 08/14/2021 CLINICAL DATA:  Lower abdominal pain and nausea. EXAM: CT ABDOMEN AND PELVIS WITH CONTRAST TECHNIQUE: Multidetector CT imaging of the abdomen and pelvis was performed using the standard protocol following bolus administration of intravenous contrast. CONTRAST:  41m OMNIPAQUE IOHEXOL 300 MG/ML  SOLN COMPARISON:  None. FINDINGS: Lower chest: A stable 1.1 cm slightly nodular appearing area of linear scarring is seen within the right lung base. Hepatobiliary: No focal liver abnormality is seen. No gallstones, gallbladder wall thickening, or biliary dilatation. Pancreas: Unremarkable. No pancreatic ductal dilatation or surrounding inflammatory changes. Spleen: Normal in size without focal abnormality. Adrenals/Urinary Tract: Adrenal glands are unremarkable. Kidneys are normal in size, without renal calculi or hydronephrosis. Subcentimeter cystic appearing areas are seen within the upper pole of the right kidney and posterior aspect of the mid left kidney. Bladder is unremarkable. Stomach/Bowel: There is a moderate sized hiatal hernia. The appendix is surgically absent. No evidence of bowel dilatation. Numerous diverticula are seen throughout the large bowel. Mild to moderate severity thickening of the proximal to mid sigmoid colon is also seen. Vascular/Lymphatic: No significant vascular findings are present. No enlarged abdominal or pelvic lymph nodes. Reproductive: Status post hysterectomy. No adnexal masses. Other: No abdominal wall hernia or abnormality. No abdominopelvic ascites. Musculoskeletal: No acute or significant osseous findings. IMPRESSION: 1. Colonic diverticulosis  with mild to moderate severity sigmoid diverticulitis. 2. Moderate sized hiatal hernia. Electronically Signed   By: TVirgina NorfolkM.D.   On: 08/14/2021 20:45    Scheduled Meds:    aspirin EC  81 mg Oral Daily   clonazePAM  1 mg Oral QHS   diltiazem  120 mg Oral BID   famotidine  20 mg Oral Daily   iron polysaccharides  150 mg Oral Daily   levothyroxine  50 mcg Oral QAC breakfast   losartan  50 mg Oral BID   magnesium oxide  400 mg Oral Daily   mycophenolate  250 mg Oral BID   rivaroxaban  10 mg Oral Daily   tacrolimus  0.5 mg Oral BID   venlafaxine XR  75 mg Oral Q breakfast   vitamin B-12  500 mcg Oral Daily    Continuous Infusions:    lactated ringers 75 mL/hr at 08/15/21 0204   piperacillin-tazobactam (ZOSYN)  IV Stopped (08/15/21 0728)   promethazine (PHENERGAN) injection (IM or IVPB)       LOS: 0 days     AVernell Leep MD,  FACP, [facility](Care Management Physician Certified) [employer] To contact the attending provider between 7A-7P or the covering provider during after hours 7P-7A, please log into the web site [email or web address] and access using universal [place] password for that web site. If  you do not have the password, please call the hospital operator.  08/15/2021, 8:40 AM

## 2021-08-15 NOTE — ED Notes (Signed)
Pt stated her iv pump kept going off beeping so she turned it off. Tech advised the nurse.

## 2021-08-15 NOTE — ED Notes (Signed)
Pt gown and linen changed. Warm blankets provided.

## 2021-08-16 DIAGNOSIS — K5792 Diverticulitis of intestine, part unspecified, without perforation or abscess without bleeding: Secondary | ICD-10-CM | POA: Diagnosis not present

## 2021-08-16 DIAGNOSIS — A419 Sepsis, unspecified organism: Secondary | ICD-10-CM | POA: Diagnosis not present

## 2021-08-16 DIAGNOSIS — Z941 Heart transplant status: Secondary | ICD-10-CM | POA: Diagnosis not present

## 2021-08-16 DIAGNOSIS — R531 Weakness: Secondary | ICD-10-CM | POA: Diagnosis not present

## 2021-08-16 DIAGNOSIS — Z86718 Personal history of other venous thrombosis and embolism: Secondary | ICD-10-CM | POA: Diagnosis not present

## 2021-08-16 LAB — BASIC METABOLIC PANEL
Anion gap: 9 (ref 5–15)
BUN: 10 mg/dL (ref 8–23)
CO2: 23 mmol/L (ref 22–32)
Calcium: 8.8 mg/dL — ABNORMAL LOW (ref 8.9–10.3)
Chloride: 107 mmol/L (ref 98–111)
Creatinine, Ser: 1.11 mg/dL — ABNORMAL HIGH (ref 0.44–1.00)
GFR, Estimated: 50 mL/min — ABNORMAL LOW (ref >60)
Glucose, Bld: 126 mg/dL — ABNORMAL HIGH (ref 70–99)
Potassium: 4.2 mmol/L (ref 3.5–5.1)
Sodium: 139 mmol/L (ref 135–145)

## 2021-08-16 LAB — CBC
HCT: 42.5 % (ref 36.0–46.0)
Hemoglobin: 13.5 g/dL (ref 12.0–15.0)
MCH: 28.4 pg (ref 26.0–34.0)
MCHC: 31.8 g/dL (ref 30.0–36.0)
MCV: 89.3 fL (ref 80.0–100.0)
Platelets: 313 10*3/uL (ref 150–400)
RBC: 4.76 MIL/uL (ref 3.87–5.11)
RDW: 13.9 % (ref 11.5–15.5)
WBC: 12.9 10*3/uL — ABNORMAL HIGH (ref 4.0–10.5)
nRBC: 0 % (ref 0.0–0.2)

## 2021-08-16 MED ORDER — DOCUSATE SODIUM 100 MG PO CAPS
100.0000 mg | ORAL_CAPSULE | Freq: Two times a day (BID) | ORAL | Status: DC
  Administered 2021-08-16 – 2021-08-19 (×7): 100 mg via ORAL
  Filled 2021-08-16 (×7): qty 1

## 2021-08-16 NOTE — Progress Notes (Signed)
PROGRESS NOTE   [name] [name]  NUU:[id number]    DOB: May 29, 1941    DOA: 08/14/2021  PCP: Ginger Organ., MD   I have briefly reviewed patients previous medical records in [facility].  Chief Complaint  Patient presents with   Abdominal Pain    Brief Narrative:  80 year old female, medical history significant for heart transplant in 2009, on immunosuppressants, followed by Dr. Kirtland Bouchard, Cardiology at [facility], OSA not on CPAP, recurrent DVT on Xarelto, hypertension, hypothyroidism, parathyroidectomy, history of C. difficile infection, anxiety and depression, presented to ED with 4 to 5 days history of initial diarrhea, resolved after Imodium, then followed by constipation, progressive lower abdominal pain, decreased appetite, nausea without vomiting and mild rectal bleeding 48 hours prior to admission.  Admitted for sepsis due to acute sigmoid diverticulitis.  Improving.   Assessment & Plan:  Principal Problem:   Sepsis ([place]) Active Problems:   Depression   Heart transplanted ([place])   HTN (hypertension)   AKI (acute kidney injury) ([place])   Acute diverticulitis   History of DVT (deep vein thrombosis)   Hypothyroidism   Sepsis, POA secondary to acute sigmoid diverticulitis: Met sepsis criteria on admission.  She presented with tachypnea, tachycardia, hypertension, leukocytosis and a source.  CT abdomen showed colonic diverticulosis with mild to moderate severity sigmoid diverticulitis.  Treated supportively with bowel rest/full liquids and advance as tolerated, briefly on IV Cipro and Flagyl but due to unknown allergy to Flagyl, switched to IV Zosyn, pain management.  Reports colonoscopy approximately 2 years ago with Dr. Cristina Gong (retired).  Low residue diet for a couple of weeks at discharge.  Improving.  Minimal, self-limiting rectal bleed x1-May be related to hemorrhoids.  Monitor closely for recurrence while on anticoagulation for DVT.  No BM since 11/16.   Abdominal pain resolved.  Tolerating full liquid diet.  Advance to soft diet today.  Given that she is on immunosuppressants, continue with IV antibiotics for additional 24 hours and then can discharge on oral antibiotics, possibly Augmentin.  Started Colace for constipation.  Dehydration: Secondary to poor oral intake over the last several days PTA.  Continue gentle IV fluids.  Although AKI is mentioned on H&P, she did not have AKI and AKI is ruled out.  Resolved.  DC IV fluids.  Encouraged oral intake  Leukocytosis: Secondary to diverticulitis.  Trend daily CBC.  Improving.  Essential hypertension: Reasonable control.  Continue diltiazem and losartan.  Hypokalemia: Replaced. Check and replace magnesium as needed.  History of heart transplant: Follows with Dr. Kirtland Bouchard at [facility].  Continue CellCept and tacrolimus.  History of recurrent DVT: Continue Xarelto.  Monitor closely for recurrent rectal bleeding.  No further bleeding reported.  Hypothyroidism: Continue levothyroxine.  OSA: Not on CPAP.  Reports that she had a sleep study done approximately 5 years ago and was told to have mild sleep apnea and did not require CPAP.  Per ED RN, overnight while sleeping, patient desaturates.  Nightly oxygen via nasal cannula.  Advised patient that she needs to follow-up with her PCP to consider repeating the sleep study.  She verbalized understanding  Anxiety and depression: Continue clonazepam and venlafaxine.  Stable.  Moderate hiatal hernia/GERD: Continue H2 blockers.  Body mass index is 30.11 kg/m.    DVT prophylaxis: rivaroxaban (XARELTO) tablet 10 mg Start: 08/15/21 1000     Code Status: Full Code Family Communication: None at bedside. Disposition:  Status is: Inpatient  Continue IV antibiotics for  additional 24 hours, reassess in a.m. and possible DC home 12/21.       Consultants:   None  Procedures:   None  Antimicrobials:   IV Cipro x1. IV Zosyn  12/18 >   Subjective:  No BM since 11/16.  Abdominal pain resolved.  Tolerating full liquid diet.  Per RN, desaturates at night while sleeping.  Patient also wants to mobilize, requested RN to do so  Objective:   Vitals:   08/16/21 0245 08/16/21 0530 08/16/21 0545 08/16/21 0655  BP:  135/62 134/67 (!) 121/56  Pulse: 84 78 82 86  Resp: (!) 21 (!) $Remo'22 20 20  'xmXrl$ Temp:      TempSrc:      SpO2: 95% 91% 96% 93%  Weight:      Height:        General exam: Elderly female, moderately built and nourished lying comfortably propped up in bed without distress.  Oral mucosa moist Respiratory system: Clear to auscultation.  No increased work of breathing.  Noted that she is still on telemetry (does not need to be), personally reviewed: Sinus rhythm.  Discontinue telemetry. Cardiovascular system: S1 & S2 heard, RRR. No JVD, murmurs, rubs, gallops or clicks. No pedal edema. Gastrointestinal system: Abdomen is nondistended, soft and nontender.  No organomegaly or masses appreciated.  Normal bowel sounds heard. Central nervous system: Alert and oriented. No focal neurological deficits. Extremities: Symmetric 5 x 5 power. Skin: No rashes, lesions or ulcers Psychiatry: Judgement and insight appear normal. Mood & affect appropriate.     Data Reviewed:   I have personally reviewed following labs and imaging studies   CBC: Recent Labs  Lab 08/14/21 1659 08/15/21 0327 08/16/21 0512  WBC 18.5* 16.7* 12.9*  HGB 16.9* 15.3* 13.5  HCT 49.5* 47.2* 42.5  MCV 85.8 90.1 89.3  PLT 420* 338 094    Basic Metabolic Panel: Recent Labs  Lab 08/14/21 1659 08/15/21 0327 08/16/21 0512  NA 136 138 139  K 3.4* 3.4* 4.2  CL 103 106 107  CO2 21* 22 23  GLUCOSE 143* 118* 126*  BUN $Re'10 10 10  'VDW$ CREATININE 1.13* 1.11* 1.11*  CALCIUM 9.5 9.0 8.8*    Liver Function Tests: Recent Labs  Lab 08/14/21 1659  AST 23  ALT 15  ALKPHOS 118  BILITOT 1.3*  PROT 7.5  ALBUMIN 3.5    CBG: No results for  input(s): GLUCAP in the last 168 hours.  Microbiology Studies:   Recent Results (from the past 240 hour(s))  Resp Panel by RT-PCR (Flu A&B, Covid) Nasopharyngeal Swab     Status: None   Collection Time: 08/14/21  9:44 PM   Specimen: Nasopharyngeal Swab; Nasopharyngeal(NP) swabs in vial transport medium  Result Value Ref Range Status   SARS Coronavirus 2 by RT PCR NEGATIVE NEGATIVE Final    Comment: (NOTE) SARS-CoV-2 target nucleic acids are NOT DETECTED.  The SARS-CoV-2 RNA is generally detectable in upper respiratory specimens during the acute phase of infection. The lowest concentration of SARS-CoV-2 viral copies this assay can detect is 138 copies/mL. A negative result does not preclude SARS-Cov-2 infection and should not be used as the sole basis for treatment or other patient management decisions. A negative result may occur with  improper specimen collection/handling, submission of specimen other than nasopharyngeal swab, presence of viral mutation(s) within the areas targeted by this assay, and inadequate number of viral copies(<138 copies/mL). A negative result must be combined with clinical observations, patient history, and epidemiological information. The expected  result is Negative.  Fact Sheet for Patients:  EntrepreneurPulse.com.au  Fact Sheet for Healthcare Providers:  IncredibleEmployment.be  This test is no t yet approved or cleared by the [place] FDA and  has been authorized for detection and/or diagnosis of SARS-CoV-2 by FDA under an Emergency Use Authorization (EUA). This EUA will remain  in effect (meaning this test can be used) for the duration of the COVID-19 declaration under Section 564(b)(1) of the Act, 21 U.S.C.section 360bbb-3(b)(1), unless the authorization is terminated  or revoked sooner.       Influenza A by PCR NEGATIVE NEGATIVE Final   Influenza B by PCR NEGATIVE NEGATIVE Final    Comment:  (NOTE) The Xpert Xpress SARS-CoV-2/FLU/RSV plus assay is intended as an aid in the diagnosis of influenza from Nasopharyngeal swab specimens and should not be used as a sole basis for treatment. Nasal washings and aspirates are unacceptable for Xpert Xpress SARS-CoV-2/FLU/RSV testing.  Fact Sheet for Patients: EntrepreneurPulse.com.au  Fact Sheet for Healthcare Providers: IncredibleEmployment.be  This test is not yet approved or cleared by the [place] FDA and has been authorized for detection and/or diagnosis of SARS-CoV-2 by FDA under an Emergency Use Authorization (EUA). This EUA will remain in effect (meaning this test can be used) for the duration of the COVID-19 declaration under Section 564(b)(1) of the Act, 21 U.S.C. section 360bbb-3(b)(1), unless the authorization is terminated or revoked.  Performed at [facility], [place] [address], [place], [place] [address]     Radiology Studies:  CT ABDOMEN PELVIS W CONTRAST  Result Date: 08/14/2021 CLINICAL DATA:  Lower abdominal pain and nausea. EXAM: CT ABDOMEN AND PELVIS WITH CONTRAST TECHNIQUE: Multidetector CT imaging of the abdomen and pelvis was performed using the standard protocol following bolus administration of intravenous contrast. CONTRAST:  77mL OMNIPAQUE IOHEXOL 300 MG/ML  SOLN COMPARISON:  None. FINDINGS: Lower chest: A stable 1.1 cm slightly nodular appearing area of linear scarring is seen within the right lung base. Hepatobiliary: No focal liver abnormality is seen. No gallstones, gallbladder wall thickening, or biliary dilatation. Pancreas: Unremarkable. No pancreatic ductal dilatation or surrounding inflammatory changes. Spleen: Normal in size without focal abnormality. Adrenals/Urinary Tract: Adrenal glands are unremarkable. Kidneys are normal in size, without renal calculi or hydronephrosis. Subcentimeter cystic appearing areas are seen within the upper pole of the right  kidney and posterior aspect of the mid left kidney. Bladder is unremarkable. Stomach/Bowel: There is a moderate sized hiatal hernia. The appendix is surgically absent. No evidence of bowel dilatation. Numerous diverticula are seen throughout the large bowel. Mild to moderate severity thickening of the proximal to mid sigmoid colon is also seen. Vascular/Lymphatic: No significant vascular findings are present. No enlarged abdominal or pelvic lymph nodes. Reproductive: Status post hysterectomy. No adnexal masses. Other: No abdominal wall hernia or abnormality. No abdominopelvic ascites. Musculoskeletal: No acute or significant osseous findings. IMPRESSION: 1. Colonic diverticulosis with mild to moderate severity sigmoid diverticulitis. 2. Moderate sized hiatal hernia. Electronically Signed   By: Virgina Norfolk M.D.   On: 08/14/2021 20:45    Scheduled Meds:    aspirin EC  81 mg Oral Daily   clonazePAM  1 mg Oral QHS   diltiazem  120 mg Oral BID   docusate sodium  100 mg Oral BID   famotidine  20 mg Oral Daily   iron polysaccharides  150 mg Oral Daily   levothyroxine  50 mcg Oral QAC breakfast   losartan  50 mg Oral BID   magnesium  oxide  400 mg Oral Daily   mycophenolate  250 mg Oral BID   rivaroxaban  10 mg Oral Daily   tacrolimus  0.5 mg Oral BID   venlafaxine XR  75 mg Oral Q breakfast   vitamin B-12  500 mcg Oral Daily    Continuous Infusions:    piperacillin-tazobactam (ZOSYN)  IV Stopped (08/16/21 0558)   promethazine (PHENERGAN) injection (IM or IVPB)       LOS: 1 day     Vernell Leep, MD,  FACP, [facility], [facility], [facility] (Care Management Physician Certified) [place]  To contact the attending provider between 7A-7P or the covering provider during after hours 7P-7A, please log into the web site [email or web address] and access using universal [place] password for that web site. If you do not have the password, please call the hospital  operator.  08/16/2021, 9:40 AM

## 2021-08-16 NOTE — ED Notes (Addendum)
Ambulated to BR and back to bed without assist. Skin care, peri care and linen change done.

## 2021-08-16 NOTE — Plan of Care (Signed)
Took over care from day nurse. Pt ambulates indepedently no s/s of pain or infection noted at this time. Pt in bed bed alarm on, 2/4 rails up with call bell near by. [name] 08/16/21 10:37 PM

## 2021-08-16 NOTE — ED Notes (Signed)
Provider at bedside

## 2021-08-17 DIAGNOSIS — R531 Weakness: Secondary | ICD-10-CM | POA: Diagnosis not present

## 2021-08-17 DIAGNOSIS — K5792 Diverticulitis of intestine, part unspecified, without perforation or abscess without bleeding: Secondary | ICD-10-CM | POA: Diagnosis not present

## 2021-08-17 DIAGNOSIS — A419 Sepsis, unspecified organism: Secondary | ICD-10-CM | POA: Diagnosis not present

## 2021-08-17 DIAGNOSIS — D849 Immunodeficiency, unspecified: Secondary | ICD-10-CM

## 2021-08-17 DIAGNOSIS — Z86718 Personal history of other venous thrombosis and embolism: Secondary | ICD-10-CM | POA: Diagnosis not present

## 2021-08-17 DIAGNOSIS — Z941 Heart transplant status: Secondary | ICD-10-CM | POA: Diagnosis not present

## 2021-08-17 LAB — CBC
HCT: 41.2 % (ref 36.0–46.0)
Hemoglobin: 13.3 g/dL (ref 12.0–15.0)
MCH: 28.2 pg (ref 26.0–34.0)
MCHC: 32.3 g/dL (ref 30.0–36.0)
MCV: 87.3 fL (ref 80.0–100.0)
Platelets: 309 10*3/uL (ref 150–400)
RBC: 4.72 MIL/uL (ref 3.87–5.11)
RDW: 13.9 % (ref 11.5–15.5)
WBC: 13.5 10*3/uL — ABNORMAL HIGH (ref 4.0–10.5)
nRBC: 0 % (ref 0.0–0.2)

## 2021-08-17 MED ORDER — SACCHAROMYCES BOULARDII 250 MG PO CAPS
250.0000 mg | ORAL_CAPSULE | Freq: Two times a day (BID) | ORAL | Status: DC
  Administered 2021-08-17 – 2021-08-19 (×4): 250 mg via ORAL
  Filled 2021-08-17 (×4): qty 1

## 2021-08-17 MED ORDER — FAMOTIDINE 20 MG PO TABS
20.0000 mg | ORAL_TABLET | Freq: Two times a day (BID) | ORAL | Status: DC
  Administered 2021-08-17 – 2021-08-19 (×4): 20 mg via ORAL
  Filled 2021-08-17 (×5): qty 1

## 2021-08-17 MED ORDER — CALCIUM CARBONATE ANTACID 500 MG PO CHEW
1.0000 | CHEWABLE_TABLET | Freq: Three times a day (TID) | ORAL | Status: DC | PRN
  Administered 2021-08-17: 200 mg via ORAL
  Filled 2021-08-17: qty 1

## 2021-08-17 MED ORDER — PANTOPRAZOLE SODIUM 40 MG PO TBEC
40.0000 mg | DELAYED_RELEASE_TABLET | Freq: Two times a day (BID) | ORAL | Status: DC
  Administered 2021-08-17 – 2021-08-19 (×4): 40 mg via ORAL
  Filled 2021-08-17 (×4): qty 1

## 2021-08-17 NOTE — Progress Notes (Signed)
Pharmacy Antibiotic Note  [name] [name] is a 80 y.o. female admitted on 08/14/2021 with  intra-abdominal infection .  Pharmacy has been consulted for Zosyn dosing. WBC elevated.   Patient now day 4 of zosyn for IAI. D/w MD, if tolerates PO may change to PO abx today with plan to discharge patient tomorrow 12/22.   Plan: Continue Zosyn 3.375G IV q8h to be infused over 4 hours Trend WBC, temp, renal function  F/U infectious work-up  Temp (24hrs), Avg:98.2 F (36.8 C), Min:97.8 F (36.6 C), Max:98.7 F (37.1 C)  Recent Labs  Lab 08/14/21 1659 08/15/21 0327 08/16/21 0512 08/17/21 0423  WBC 18.5* 16.7* 12.9* 13.5*  CREATININE 1.13* 1.11* 1.11*  --      Estimated Creatinine Clearance: 38.4 mL/min (A) (by C-G formula based on SCr of 1.11 mg/dL (H)).    Allergies  Allergen Reactions   Antihistamines, Chlorpheniramine-Type Other (See Comments)    Numbness   Augmentin [Amoxicillin-Pot Clavulanate]     Nausea only   Cyclobenzaprine Other (See Comments)   Metronidazole     Unknown reaction    Valtrex [Valacyclovir]     "Made my chin numb"   Codeine Nausea Only    Marrianne Sica A. Jeanella Craze, PharmD, BCPS, FNKF Clinical Pharmacist [place] Please utilize Amion for appropriate phone number to reach the unit pharmacist [facility])

## 2021-08-17 NOTE — Evaluation (Signed)
Physical Therapy Evaluation Patient Details Name: [name] [name] MRN: [id number] DOB: [birth year] Today's Date: 08/17/2021  History of Present Illness  80 year old female admitted 12/18 to ED with 4 to 5 days history of initial diarrhea, resolved after Imodium, then followed by constipation, progressive lower abdominal pain, decreased appetite, nausea without vomiting and mild rectal bleeding 48 hours prior to admission.  Admitted for sepsis due to acute sigmoid diverticulitis.  PMH:  heart transplant in 2009, on immunosuppressants, followed by Dr. Dot Lanes, Cardiology at [facility], OSA not on CPAP, recurrent DVT on Xarelto, hypertension, hypothyroidism, parathyroidectomy, history of C. difficile infection, anxiety and depression  Clinical Impression  Pt admitted with above diagnosis. Pt ambulating well without LOB without device although she does have guarded gait.  Recommend pt use her RW when she goes home and pt agrees.  Also recommend HHPT continues. Will follow acutely.  Pt currently with functional limitations due to the deficits listed below (see PT Problem List). Pt will benefit from skilled PT to increase their independence and safety with mobility to allow discharge to the venue listed below.          Recommendations for follow up therapy are one component of a multi-disciplinary discharge planning process, led by the attending physician.  Recommendations may be updated based on patient status, additional functional criteria and insurance authorization.  Follow Up Recommendations Home health PT    Assistance Recommended at Discharge PRN  Functional Status Assessment Patient has had a recent decline in their functional status and demonstrates the ability to make significant improvements in function in a reasonable and predictable amount of time.  Equipment Recommendations  None recommended by PT    Recommendations for Other Services       Precautions / Restrictions  Precautions Precautions: Fall Restrictions Weight Bearing Restrictions: No      Mobility  Bed Mobility Overal bed mobility: Independent                  Transfers Overall transfer level: Independent                      Ambulation/Gait Ambulation/Gait assistance: Independent Gait Distance (Feet): 350 Feet Assistive device: None Gait Pattern/deviations: Step-through pattern;Decreased stride length   Gait velocity interpretation: <1.8 ft/sec, indicate of risk for recurrent falls   General Gait Details: Pt with guarded gait but no LOB with ambulation. Pt states she doesnt feel unsteady she is just cautious and that she is at her baseline gait. Discussed with pt to use RW at home initially if needed and pt agrees that it may make her more comfortable intiially.  Stairs            Wheelchair Mobility    Modified Rankin (Stroke Patients Only)       Balance Overall balance assessment: Needs assistance;History of Falls Sitting-balance support: No upper extremity supported;Feet supported Sitting balance-Leahy Scale: Fair     Standing balance support: No upper extremity supported;During functional activity Standing balance-Leahy Scale: Fair Standing balance comment: Ambulating without device with good balance.                             Pertinent Vitals/Pain Pain Assessment: No/denies pain    Home Living Family/patient expects to be discharged to:: Private residence Living Arrangements: Spouse/significant other Available Help at Discharge: Family;Available 24 hours/day Type of Home: Independent living facility ([facility]) Home Access: Level entry  Home Equipment: Agricultural consultant (2 wheels);Cane - single point Additional Comments: Was doing PT 2 days week for balance and strengthening    Prior Function Prior Level of Function : Independent/Modified Independent                     Hand Dominance   Dominant Hand:  Right    Extremity/Trunk Assessment   Upper Extremity Assessment Upper Extremity Assessment: Defer to OT evaluation    Lower Extremity Assessment Lower Extremity Assessment: Generalized weakness    Cervical / Trunk Assessment Cervical / Trunk Assessment: Normal  Communication   Communication: No difficulties  Cognition Arousal/Alertness: Awake/alert Behavior During Therapy: WFL for tasks assessed/performed Overall Cognitive Status: Within Functional Limits for tasks assessed                                          General Comments      Exercises General Exercises - Lower Extremity Ankle Circles/Pumps: AROM;Both;10 reps;Seated Long Arc Quad: AROM;Both;10 reps;Seated   Assessment/Plan    PT Assessment Patient needs continued PT services  PT Problem List Decreased activity tolerance;Decreased balance;Decreased mobility;Decreased knowledge of use of DME;Decreased safety awareness;Decreased knowledge of precautions       PT Treatment Interventions Gait training;DME instruction;Functional mobility training;Therapeutic activities;Therapeutic exercise;Balance training;Patient/family education    PT Goals (Current goals can be found in the Care Plan section)  Acute Rehab PT Goals Patient Stated Goal: to go home PT Goal Formulation: With patient Time For Goal Achievement: 08/31/21 Potential to Achieve Goals: Good    Frequency Min 3X/week   Barriers to discharge        Co-evaluation               AM-PAC PT "6 Clicks" Mobility  Outcome Measure Help needed turning from your back to your side while in a flat bed without using bedrails?: None Help needed moving from lying on your back to sitting on the side of a flat bed without using bedrails?: None Help needed moving to and from a bed to a chair (including a wheelchair)?: None Help needed standing up from a chair using your arms (e.g., wheelchair or bedside chair)?: A Little Help needed to walk  in hospital room?: A Little Help needed climbing 3-5 steps with a railing? : A Little 6 Click Score: 21    End of Session Equipment Utilized During Treatment: Gait belt Activity Tolerance: Patient tolerated treatment well Patient left: in chair;with call bell/phone within reach Nurse Communication: Mobility status PT Visit Diagnosis: Muscle weakness (generalized) (M62.81)    Time: 5631-4970 PT Time Calculation (min) (ACUTE ONLY): 26 min   Charges:   PT Evaluation $PT Eval Moderate Complexity: 1 Mod PT Treatments $Gait Training: 8-22 mins        Kingstin Heims M,PT Acute Rehab Services [phone] (pager)   Bevelyn Buckles 08/17/2021, 12:38 PM

## 2021-08-17 NOTE — TOC Initial Note (Signed)
Transition of Care [facility]) - Initial/Assessment Note    Patient Details  Name: [name] [name] MRN: [id number] Date of Birth: 1941-07-16  [facility]) CM/SW Contact:    Milinda Antis, [place] Phone Number: 08/17/2021, 3:23 PM  Clinical Narrative:                 10:45- CSW met with the patient at bedside to discuss discharge planning.  The patient reports that she is from [place] where she lives with her husband.  The patient reports having 4 COVID vaccines.  The patient reports that she would like to return to the Independent living facility with home health.    Pending PT/OT recommendations  Expected Discharge Plan: [place] Barriers to Discharge: Continued Medical Work up   Patient Goals and CMS Choice Patient states their goals for this hospitalization and ongoing recovery are:: To get back to her independent living facility CMS Medicare.gov Compare Post Acute Care list provided to:: Patient Choice offered to / list presented to : Patient  Expected Discharge Plan and Services Expected Discharge Plan: [place]       Living arrangements for the past 2 months: [place]                                      Prior Living Arrangements/Services Living arrangements for the past 2 months: [place] Lives with:: Spouse, Self Patient language and need for interpreter reviewed:: Yes Do you feel safe going back to the place where you live?: Yes      Need for Family Participation in Patient Care: Yes (Comment) Care giver support system in place?: Yes (comment)   Criminal Activity/Legal Involvement Pertinent to Current Situation/Hospitalization: No - Comment as needed  Activities of Daily Living Home Assistive Devices/Equipment: None ADL Screening (condition at time of admission) Patient's cognitive ability adequate to safely complete daily activities?: Yes Is the patient  deaf or have difficulty hearing?: No Does the patient have difficulty seeing, even when wearing glasses/contacts?: No Does the patient have difficulty concentrating, remembering, or making decisions?: No Patient able to express need for assistance with ADLs?: Yes Does the patient have difficulty dressing or bathing?: No Independently performs ADLs?: Yes (appropriate for developmental age) Does the patient have difficulty walking or climbing stairs?: No Weakness of Legs: None Weakness of Arms/Hands: None  Permission Sought/Granted   Permission granted to share information with : Yes, Verbal Permission Granted     Permission granted to share info w AGENCY: Independent living facility, insurance company, [facility] agencies        Emotional Assessment Appearance:: Appears younger than stated age Attitude/Demeanor/Rapport: Engaged Affect (typically observed): Accepting Orientation: : Oriented to Situation, Oriented to  Time, Oriented to Place, Oriented to Self Alcohol / Substance Use: Not Applicable Psych Involvement: No (comment)  Admission diagnosis:  Generalized weakness [R53.1] Nausea and vomiting in adult [R11.2] Sepsis ([place]) [A41.9] Acute diverticulitis [K57.92] Patient Active Problem List   Diagnosis Date Noted   AKI (acute kidney injury) ([place]) 08/15/2021   Acute diverticulitis 08/15/2021   History of DVT (deep vein thrombosis) 08/15/2021   Hypothyroidism 08/15/2021   Sepsis ([place]) 08/14/2021   Colitis 07/18/2016   Depression    Heart transplanted ([place])    HTN (hypertension)    PCP:  Ginger Organ., MD Pharmacy:   [facility] [phone] -  [address] [place] [address] Phone: [phone] Fax: [phone]     Social Determinants of Health (SDOH) Interventions    Readmission Risk Interventions No flowsheet data found.

## 2021-08-17 NOTE — Plan of Care (Signed)
  Problem: Health Behavior/Discharge Planning: Goal: Ability to manage health-related needs will improve Outcome: Progressing   Problem: Activity: Goal: Risk for activity intolerance will decrease Outcome: Progressing   Problem: Nutrition: Goal: Adequate nutrition will be maintained Outcome: Progressing   Problem: Coping: Goal: Level of anxiety will decrease Outcome: Progressing   

## 2021-08-17 NOTE — Progress Notes (Addendum)
PROGRESS NOTE    [name] [name]  JFH:[id number] DOB: 1941-06-20 DOA: 08/14/2021 PCP: Ginger Organ., MD    Chief Complaint  Patient presents with   Abdominal Pain    Brief Narrative:  80 year old female, medical history significant for heart transplant in 2009, on immunosuppressants, followed by Dr. Kirtland Bouchard, Cardiology at [facility], OSA not on CPAP, recurrent DVT on Xarelto, hypertension, hypothyroidism, parathyroidectomy, history of C. difficile infection, anxiety and depression, presented to ED with 4 to 5 days history of initial diarrhea, resolved after Imodium, then followed by constipation, progressive lower abdominal pain, decreased appetite, nausea without vomiting and mild rectal bleeding 48 hours prior to admission.  Admitted for sepsis due to acute sigmoid diverticulitis.  Improving.  Subjective:  Reports feeling better, left lower ab pain has much improved No n/v today  Assessment & Plan:   Principal Problem:   Sepsis ([place]) Active Problems:   Depression   Heart transplanted ([place])   HTN (hypertension)   AKI (acute kidney injury) ([place])   Acute diverticulitis   History of DVT (deep vein thrombosis)   Hypothyroidism   Sepsis present on admission due to acute sigmoid diverticulitis/dehydration -Met sepsis criteria on admission.  She presented with tachypnea, tachycardia, hypertension, leukocytosis and a source.  CT abdomen showed colonic diverticulosis with mild to moderate severity sigmoid diverticulitis. -She was initially treated with Cipro and Flagyl but due to unknown allergy to Flagyl, antibiotic changed to Zosyn -Continue IV antibiotic given immunosuppressant status, report history not able to tolerate Augmentin, will discuss with ID regarding antibiotic choices -Leukocytosis improving  Hypokalemia Replaced, normalized Check magnesium  CKD 3 A Creatinine at baseline  Hypertension Blood pressure stable on current regimen  History of  heart transplant On CellCept and tacrolimus Follows with Dr. Kirtland B[facility]ard at [facility]  History recurrent DVT On Xarelto  Hypothyroidism Continue Synthroid  OSA: Not on CPAP.  Reports that she had a sleep study done approximately 5 years ago and was told to have mild sleep apnea and did not require CPAP.  Per ED RN, overnight while sleeping, patient desaturates.  Nightly oxygen via nasal cannula.  Advised patient that she needs to follow-up with her PCP to consider repeating the sleep study.  She verbalized understanding  Anxiety and depression: Continue clonazepam and venlafaxine.  Stable.   Moderate hiatal hernia/GERD: Continue H2 blockers.  Tums as needed  moderate sized hiatal hernia  : Body mass index is 30.11 kg/m.Marland Kitchen    Unresulted Labs (From admission, onward)     Start     Ordered   08/18/21 0500  CBC with Differential/Platelet  Tomorrow morning,   R        08/17/21 1706   08/18/21 9373  Basic metabolic panel  Tomorrow morning,   R        08/17/21 1706   08/18/21 0500  Magnesium  Tomorrow morning,   R        08/17/21 1706              DVT prophylaxis: rivaroxaban (XARELTO) tablet 10 mg Start: 08/15/21 1000 rivaroxaban (XARELTO) tablet 10 mg   Code Status: Full Family Communication: Patient Disposition:   Status is: Inpatient  Dispo: The patient is from: Independent living               Anticipated d/c is to: Patient desired to return to independent living with home health PT  Anticipated d/c date is: Likely tomorrow if able to tolerate oral antibiotic                Consultants:  Phone conversation with ID  Procedures:  None  Antimicrobials:   Anti-infectives (From admission, onward)    Start     Dose/Rate Route Frequency Ordered Stop   08/14/21 2330  piperacillin-tazobactam (ZOSYN) IVPB 3.375 g        3.375 g 12.5 mL/hr over 240 Minutes Intravenous Every 8 hours 08/14/21 2317     08/14/21 2130  ciprofloxacin  (CIPRO) IVPB 400 mg  Status:  Discontinued        400 mg 200 mL/hr over 60 Minutes Intravenous  Once 08/14/21 2120 08/15/21 0201   08/14/21 2130  metroNIDAZOLE (FLAGYL) IVPB 500 mg  Status:  Discontinued        500 mg 100 mL/hr over 60 Minutes Intravenous Every 12 hours 08/14/21 2120 08/14/21 2249           Objective: Vitals:   08/17/21 0000 08/17/21 0456 08/17/21 0917 08/17/21 1655  BP: (!) 144/78 (!) 135/56 (!) 136/41 (!) 133/55  Pulse: 98 71 75 83  Resp: $Remo'18 18 18 17  'GVyur$ Temp: 98.2 F (36.8 C) 98.7 F (37.1 C) 98.1 F (36.7 C) 98.1 F (36.7 C)  TempSrc: Oral Oral Oral Oral  SpO2: 96% 93% 94% 94%  Weight:      Height:        Intake/Output Summary (Last 24 hours) at 08/17/2021 1711 Last data filed at 08/17/2021 1241 Gross per 24 hour  Intake 920 ml  Output 3 ml  Net 917 ml   Filed Weights   08/15/21 0906  Weight: 77.1 kg    Examination:  General exam: alert, awake, communicative,calm, NAD Respiratory system: Clear to auscultation. Respiratory effort normal. Cardiovascular system:  RRR.  Gastrointestinal system: Abdomen is nondistended, soft and nontender.  Normal bowel sounds heard. Central nervous system: Alert and oriented. No focal neurological deficits. Extremities:  no edema Skin: No rashes, lesions or ulcers Psychiatry: Judgement and insight appear normal. Mood & affect appropriate.     Data Reviewed: I have personally reviewed following labs and imaging studies  CBC: Recent Labs  Lab 08/14/21 1659 08/15/21 0327 08/16/21 0512 08/17/21 0423  WBC 18.5* 16.7* 12.9* 13.5*  HGB 16.9* 15.3* 13.5 13.3  HCT 49.5* 47.2* 42.5 41.2  MCV 85.8 90.1 89.3 87.3  PLT 420* 338 313 628    Basic Metabolic Panel: Recent Labs  Lab 08/14/21 1659 08/15/21 0327 08/16/21 0512  NA 136 138 139  K 3.4* 3.4* 4.2  CL 103 106 107  CO2 21* 22 23  GLUCOSE 143* 118* 126*  BUN $Re'10 10 10  'bqQ$ CREATININE 1.13* 1.11* 1.11*  CALCIUM 9.5 9.0 8.8*    GFR: Estimated  Creatinine Clearance: 38.4 mL/min (A) (by C-G formula based on SCr of 1.11 mg/dL (H)).  Liver Function Tests: Recent Labs  Lab 08/14/21 1659  AST 23  ALT 15  ALKPHOS 118  BILITOT 1.3*  PROT 7.5  ALBUMIN 3.5    CBG: No results for input(s): GLUCAP in the last 168 hours.   Recent Results (from the past 240 hour(s))  Resp Panel by RT-PCR (Flu A&B, Covid) Nasopharyngeal Swab     Status: None   Collection Time: 08/14/21  9:44 PM   Specimen: Nasopharyngeal Swab; Nasopharyngeal(NP) swabs in vial transport medium  Result Value Ref Range Status   SARS Coronavirus 2 by RT PCR NEGATIVE NEGATIVE Final  Comment: (NOTE) SARS-CoV-2 target nucleic acids are NOT DETECTED.  The SARS-CoV-2 RNA is generally detectable in upper respiratory specimens during the acute phase of infection. The lowest concentration of SARS-CoV-2 viral copies this assay can detect is 138 copies/mL. A negative result does not preclude SARS-Cov-2 infection and should not be used as the sole basis for treatment or other patient management decisions. A negative result may occur with  improper specimen collection/handling, submission of specimen other than nasopharyngeal swab, presence of viral mutation(s) within the areas targeted by this assay, and inadequate number of viral copies(<138 copies/mL). A negative result must be combined with clinical observations, patient history, and epidemiological information. The expected result is Negative.  Fact Sheet for Patients:  EntrepreneurPulse.com.au  Fact Sheet for Healthcare Providers:  IncredibleEmployment.be  This test is no t yet approved or cleared by the [place] FDA and  has been authorized for detection and/or diagnosis of SARS-CoV-2 by FDA under an Emergency Use Authorization (EUA). This EUA will remain  in effect (meaning this test can be used) for the duration of the COVID-19 declaration under Section 564(b)(1) of the  Act, 21 U.S.C.section 360bbb-3(b)(1), unless the authorization is terminated  or revoked sooner.       Influenza A by PCR NEGATIVE NEGATIVE Final   Influenza B by PCR NEGATIVE NEGATIVE Final    Comment: (NOTE) The Xpert Xpress SARS-CoV-2/FLU/RSV plus assay is intended as an aid in the diagnosis of influenza from Nasopharyngeal swab specimens and should not be used as a sole basis for treatment. Nasal washings and aspirates are unacceptable for Xpert Xpress SARS-CoV-2/FLU/RSV testing.  Fact Sheet for Patients: EntrepreneurPulse.com.au  Fact Sheet for Healthcare Providers: IncredibleEmployment.be  This test is not yet approved or cleared by the [place] FDA and has been authorized for detection and/or diagnosis of SARS-CoV-2 by FDA under an Emergency Use Authorization (EUA). This EUA will remain in effect (meaning this test can be used) for the duration of the COVID-19 declaration under Section 564(b)(1) of the Act, 21 U.S.C. section 360bbb-3(b)(1), unless the authorization is terminated or revoked.  Performed at [facility], [place] [address], [place], [place] [address]          Radiology Studies: No results found.      Scheduled Meds:  aspirin EC  81 mg Oral Daily   clonazePAM  1 mg Oral QHS   diltiazem  120 mg Oral BID   docusate sodium  100 mg Oral BID   famotidine  20 mg Oral Daily   iron polysaccharides  150 mg Oral Daily   levothyroxine  50 mcg Oral QAC breakfast   losartan  50 mg Oral BID   magnesium oxide  400 mg Oral Daily   mycophenolate  250 mg Oral BID   rivaroxaban  10 mg Oral Daily   saccharomyces boulardii  250 mg Oral BID   tacrolimus  0.5 mg Oral BID   venlafaxine XR  75 mg Oral Q breakfast   vitamin B-12  500 mcg Oral Daily   Continuous Infusions:  piperacillin-tazobactam (ZOSYN)  IV 3.375 g (08/17/21 1348)   promethazine (PHENERGAN) injection (IM or IVPB)       LOS: 2 days   Time spent:  57mins Greater than 50% of this time was spent in counseling, explanation of diagnosis, planning of further management, and coordination of care.   Voice Recognition Viviann Spare dictation system was used to create this note, attempts have been made to correct errors. Please contact the Chief Strategy Officer  with questions and/or clarifications.   Florencia Reasons, MD PhD FACP [facility]  Available via Epic secure chat 7am-7pm for nonurgent issues Please page for urgent issues To page the attending provider between 7A-7P or the covering provider during after hours 7P-7A, please log into the web site [email or web address] and access using universal [place] password for that web site. If you do not have the password, please call the hospital operator.    08/17/2021, 5:11 PM

## 2021-08-18 ENCOUNTER — Other Ambulatory Visit (HOSPITAL_COMMUNITY): Payer: Self-pay

## 2021-08-18 DIAGNOSIS — Z86718 Personal history of other venous thrombosis and embolism: Secondary | ICD-10-CM | POA: Diagnosis not present

## 2021-08-18 DIAGNOSIS — A419 Sepsis, unspecified organism: Secondary | ICD-10-CM | POA: Diagnosis not present

## 2021-08-18 DIAGNOSIS — K5792 Diverticulitis of intestine, part unspecified, without perforation or abscess without bleeding: Secondary | ICD-10-CM | POA: Diagnosis not present

## 2021-08-18 DIAGNOSIS — Z941 Heart transplant status: Secondary | ICD-10-CM | POA: Diagnosis not present

## 2021-08-18 DIAGNOSIS — D849 Immunodeficiency, unspecified: Secondary | ICD-10-CM | POA: Diagnosis not present

## 2021-08-18 DIAGNOSIS — R531 Weakness: Secondary | ICD-10-CM | POA: Diagnosis not present

## 2021-08-18 LAB — CBC WITH DIFFERENTIAL/PLATELET
Abs Immature Granulocytes: 0.04 10*3/uL (ref 0.00–0.07)
Basophils Absolute: 0.1 10*3/uL (ref 0.0–0.1)
Basophils Relative: 0 %
Eosinophils Absolute: 0.3 10*3/uL (ref 0.0–0.5)
Eosinophils Relative: 2 %
HCT: 38.8 % (ref 36.0–46.0)
Hemoglobin: 13.1 g/dL (ref 12.0–15.0)
Immature Granulocytes: 0 %
Lymphocytes Relative: 27 %
Lymphs Abs: 3.3 10*3/uL (ref 0.7–4.0)
MCH: 29.2 pg (ref 26.0–34.0)
MCHC: 33.8 g/dL (ref 30.0–36.0)
MCV: 86.6 fL (ref 80.0–100.0)
Monocytes Absolute: 1.6 10*3/uL — ABNORMAL HIGH (ref 0.1–1.0)
Monocytes Relative: 13 %
Neutro Abs: 7.2 10*3/uL (ref 1.7–7.7)
Neutrophils Relative %: 58 %
Platelets: 286 10*3/uL (ref 150–400)
RBC: 4.48 MIL/uL (ref 3.87–5.11)
RDW: 13.5 % (ref 11.5–15.5)
WBC: 12.5 10*3/uL — ABNORMAL HIGH (ref 4.0–10.5)
nRBC: 0 % (ref 0.0–0.2)

## 2021-08-18 LAB — BASIC METABOLIC PANEL
Anion gap: 9 (ref 5–15)
BUN: 8 mg/dL (ref 8–23)
CO2: 21 mmol/L — ABNORMAL LOW (ref 22–32)
Calcium: 8.7 mg/dL — ABNORMAL LOW (ref 8.9–10.3)
Chloride: 105 mmol/L (ref 98–111)
Creatinine, Ser: 1.03 mg/dL — ABNORMAL HIGH (ref 0.44–1.00)
GFR, Estimated: 54 mL/min — ABNORMAL LOW (ref >60)
Glucose, Bld: 114 mg/dL — ABNORMAL HIGH (ref 70–99)
Potassium: 3 mmol/L — ABNORMAL LOW (ref 3.5–5.1)
Sodium: 135 mmol/L (ref 135–145)

## 2021-08-18 LAB — MAGNESIUM: Magnesium: 1.5 mg/dL — ABNORMAL LOW (ref 1.7–2.4)

## 2021-08-18 MED ORDER — PROMETHAZINE HCL 25 MG PO TABS: 12.5000 mg | ORAL_TABLET | Freq: Four times a day (QID) | ORAL | Status: DC | PRN

## 2021-08-18 MED ORDER — PROMETHAZINE HCL 12.5 MG PO TABS
12.5000 mg | ORAL_TABLET | Freq: Four times a day (QID) | ORAL | 0 refills | Status: DC | PRN
  Filled 2021-08-18: qty 30, 8d supply, fill #0

## 2021-08-18 MED ORDER — SACCHAROMYCES BOULARDII 250 MG PO CAPS
250.0000 mg | ORAL_CAPSULE | Freq: Two times a day (BID) | ORAL | 0 refills | Status: AC
Start: 2021-08-18 — End: 2021-09-17
  Filled 2021-08-18: qty 20, 10d supply, fill #0

## 2021-08-18 MED ORDER — AMOXICILLIN-POT CLAVULANATE 875-125 MG PO TABS
1.0000 | ORAL_TABLET | Freq: Two times a day (BID) | ORAL | Status: DC
  Administered 2021-08-18 – 2021-08-19 (×3): 1 via ORAL
  Filled 2021-08-18 (×3): qty 1

## 2021-08-18 MED ORDER — MAGNESIUM SULFATE 2 GM/50ML IV SOLN
2.0000 g | Freq: Once | INTRAVENOUS | Status: AC
  Administered 2021-08-18: 2 g via INTRAVENOUS
  Filled 2021-08-18: qty 50

## 2021-08-18 MED ORDER — POTASSIUM CHLORIDE CRYS ER 20 MEQ PO TBCR
40.0000 meq | EXTENDED_RELEASE_TABLET | ORAL | Status: AC
  Administered 2021-08-18 (×2): 40 meq via ORAL
  Filled 2021-08-18 (×2): qty 2

## 2021-08-18 MED ORDER — CALCIUM CARBONATE ANTACID 500 MG PO CHEW: 1.0000 | CHEWABLE_TABLET | Freq: Three times a day (TID) | ORAL | Status: AC | PRN

## 2021-08-18 MED ORDER — AMOXICILLIN-POT CLAVULANATE 875-125 MG PO TABS
1.0000 | ORAL_TABLET | Freq: Two times a day (BID) | ORAL | 0 refills | Status: AC
  Filled 2021-08-18: qty 10, 5d supply, fill #0

## 2021-08-18 NOTE — Plan of Care (Signed)
°  Problem: Education: Goal: Knowledge of General Education information will improve Description: Including pain rating scale, medication(s)/side effects and non-pharmacologic comfort measures 08/18/2021 1135 by Katrina Stack, RN Outcome: Adequate for Discharge 08/18/2021 0755 by Katrina Stack, RN Outcome: Progressing   Problem: Health Behavior/Discharge Planning: Goal: Ability to manage health-related needs will improve Outcome: Adequate for Discharge   Problem: Clinical Measurements: Goal: Ability to maintain clinical measurements within normal limits will improve Outcome: Adequate for Discharge Goal: Will remain free from infection Outcome: Adequate for Discharge Goal: Diagnostic test results will improve Outcome: Adequate for Discharge Goal: Respiratory complications will improve Outcome: Adequate for Discharge Goal: Cardiovascular complication will be avoided Outcome: Adequate for Discharge   Problem: Activity: Goal: Risk for activity intolerance will decrease 08/18/2021 1135 by Katrina Stack, RN Outcome: Adequate for Discharge 08/18/2021 0755 by Katrina Stack, RN Outcome: Progressing   Problem: Nutrition: Goal: Adequate nutrition will be maintained 08/18/2021 1135 by Katrina Stack, RN Outcome: Adequate for Discharge 08/18/2021 0755 by Katrina Stack, RN Outcome: Progressing   Problem: Coping: Goal: Level of anxiety will decrease 08/18/2021 1135 by Katrina Stack, RN Outcome: Adequate for Discharge 08/18/2021 0755 by Katrina Stack, RN Outcome: Progressing   Problem: Elimination: Goal: Will not experience complications related to bowel motility Outcome: Adequate for Discharge Goal: Will not experience complications related to urinary retention Outcome: Adequate for Discharge   Problem: Pain Managment: Goal: General experience of comfort will improve Outcome: Adequate for Discharge   Problem: Safety: Goal: Ability to remain free  from injury will improve Outcome: Adequate for Discharge   Problem: Skin Integrity: Goal: Risk for impaired skin integrity will decrease Outcome: Adequate for Discharge   Problem: Acute Rehab PT Goals(only PT should resolve) Goal: Pt Will Go Supine/Side To Sit Outcome: Adequate for Discharge Goal: Patient Will Transfer Sit To/From Stand Outcome: Adequate for Discharge Goal: Pt Will Ambulate Outcome: Adequate for Discharge

## 2021-08-18 NOTE — Plan of Care (Signed)
  Problem: Education: Goal: Knowledge of General Education information will improve Description: Including pain rating scale, medication(s)/side effects and non-pharmacologic comfort measures Outcome: Progressing   Problem: Activity: Goal: Risk for activity intolerance will decrease Outcome: Progressing   Problem: Nutrition: Goal: Adequate nutrition will be maintained Outcome: Progressing   Problem: Coping: Goal: Level of anxiety will decrease Outcome: Progressing   

## 2021-08-18 NOTE — Discharge Summary (Incomplete)
Discharge Summary  [name] [name] [id number] DOB: May 05, 1941  PCP: Ginger Organ., MD  Admit date: 08/14/2021 Discharge date: 08/18/2021  Time spent: 52mins, more than 50% time spent on coordination of care. 91mins  Recommendations for Outpatient Follow-up:  F/u with PCP within a week  for hospital discharge follow up, repeat cbc/bmp at follow up Home health PT order placed  Discharge Diagnoses:  Active Hospital Problems   Diagnosis Date Noted   Sepsis ([place]) 08/14/2021   AKI (acute kidney injury) ([place]) 08/15/2021   Acute diverticulitis 08/15/2021   History of DVT (deep vein thrombosis) 08/15/2021   Hypothyroidism 08/15/2021   Heart transplanted ([place])    HTN (hypertension)    Depression     Resolved Hospital Problems  No resolved problems to display.    Discharge Condition: stable  Diet recommendation: heart healthy/carb modified  Filed Weights   08/15/21 0906  Weight: 77.1 kg    History of present illness:  [facility] Course:  Principal Problem:   Sepsis ([place]) Active Problems:   Depression   Heart transplanted ([place])   HTN (hypertension)   AKI (acute kidney injury) ([place])   Acute diverticulitis   History of DVT (deep vein thrombosis)   Hypothyroidism   Procedures: *  Consultations: *  Discharge Exam: BP 129/60 (BP Location: Right Arm)    Pulse 70    Temp 98.3 F (36.8 C) (Oral)    Resp 17    Ht 5\' 3"  (1.6 m)    Wt 77.1 kg    SpO2 91%    BMI 30.11 kg/m   General: * Cardiovascular: * Respiratory: *  Discharge Instructions You were cared for by a hospitalist during your hospital stay. If you have any questions about your discharge medications or the care you received while you were in the hospital after you are discharged, you can call the unit and asked to speak with the hospitalist on call if the hospitalist that took care of you is not available. Once you are discharged, your primary care physician will handle any further medical  issues. Please note that NO REFILLS for any discharge medications will be authorized once you are discharged, as it is imperative that you return to your primary care physician (or establish a relationship with a primary care physician if you do not have one) for your aftercare needs so that they can reassess your need for medications and monitor your lab values.  Discharge Instructions     Diet - low sodium heart healthy   Complete by: As directed    Increase activity slowly   Complete by: As directed       Allergies as of 08/18/2021       Reactions   Antihistamines, Chlorpheniramine-type Other (See Comments)   Numbness   Augmentin [amoxicillin-pot Clavulanate]    Nausea only   Cyclobenzaprine Other (See Comments)   Metronidazole    Unknown reaction    Valtrex [valacyclovir]    "Made my chin numb"   Codeine Nausea Only     Med Rec must be completed prior to using this SMARTLINK***      Allergies  Allergen Reactions   Antihistamines, Chlorpheniramine-Type Other (See Comments)    Numbness   Augmentin [Amoxicillin-Pot Clavulanate]     Nausea only   Cyclobenzaprine Other (See Comments)   Metronidazole     Unknown reaction    Valtrex [Valacyclovir]     "Made my chin numb"   Codeine Nausea Only  The results of significant diagnostics from this hospitalization (including imaging, microbiology, ancillary and laboratory) are listed below for reference.    Significant Diagnostic Studies: CT ABDOMEN PELVIS W CONTRAST  Result Date: 08/14/2021 CLINICAL DATA:  Lower abdominal pain and nausea. EXAM: CT ABDOMEN AND PELVIS WITH CONTRAST TECHNIQUE: Multidetector CT imaging of the abdomen and pelvis was performed using the standard protocol following bolus administration of intravenous contrast. CONTRAST:  19mL OMNIPAQUE IOHEXOL 300 MG/ML  SOLN COMPARISON:  None. FINDINGS: Lower chest: A stable 1.1 cm slightly nodular appearing area of linear scarring is seen within the right  lung base. Hepatobiliary: No focal liver abnormality is seen. No gallstones, gallbladder wall thickening, or biliary dilatation. Pancreas: Unremarkable. No pancreatic ductal dilatation or surrounding inflammatory changes. Spleen: Normal in size without focal abnormality. Adrenals/Urinary Tract: Adrenal glands are unremarkable. Kidneys are normal in size, without renal calculi or hydronephrosis. Subcentimeter cystic appearing areas are seen within the upper pole of the right kidney and posterior aspect of the mid left kidney. Bladder is unremarkable. Stomach/Bowel: There is a moderate sized hiatal hernia. The appendix is surgically absent. No evidence of bowel dilatation. Numerous diverticula are seen throughout the large bowel. Mild to moderate severity thickening of the proximal to mid sigmoid colon is also seen. Vascular/Lymphatic: No significant vascular findings are present. No enlarged abdominal or pelvic lymph nodes. Reproductive: Status post hysterectomy. No adnexal masses. Other: No abdominal wall hernia or abnormality. No abdominopelvic ascites. Musculoskeletal: No acute or significant osseous findings. IMPRESSION: 1. Colonic diverticulosis with mild to moderate severity sigmoid diverticulitis. 2. Moderate sized hiatal hernia. Electronically Signed   By: Virgina Norfolk M.D.   On: 08/14/2021 20:45    Microbiology: Recent Results (from the past 240 hour(s))  Resp Panel by RT-PCR (Flu A&B, Covid) Nasopharyngeal Swab     Status: None   Collection Time: 08/14/21  9:44 PM   Specimen: Nasopharyngeal Swab; Nasopharyngeal(NP) swabs in vial transport medium  Result Value Ref Range Status   SARS Coronavirus 2 by RT PCR NEGATIVE NEGATIVE Final    Comment: (NOTE) SARS-CoV-2 target nucleic acids are NOT DETECTED.  The SARS-CoV-2 RNA is generally detectable in upper respiratory specimens during the acute phase of infection. The lowest concentration of SARS-CoV-2 viral copies this assay can detect is 138  copies/mL. A negative result does not preclude SARS-Cov-2 infection and should not be used as the sole basis for treatment or other patient management decisions. A negative result may occur with  improper specimen collection/handling, submission of specimen other than nasopharyngeal swab, presence of viral mutation(s) within the areas targeted by this assay, and inadequate number of viral copies(<138 copies/mL). A negative result must be combined with clinical observations, patient history, and epidemiological information. The expected result is Negative.  Fact Sheet for Patients:  EntrepreneurPulse.com.au  Fact Sheet for Healthcare Providers:  IncredibleEmployment.be  This test is no t yet approved or cleared by the [place] FDA and  has been authorized for detection and/or diagnosis of SARS-CoV-2 by FDA under an Emergency Use Authorization (EUA). This EUA will remain  in effect (meaning this test can be used) for the duration of the COVID-19 declaration under Section 564(b)(1) of the Act, 21 U.S.C.section 360bbb-3(b)(1), unless the authorization is terminated  or revoked sooner.       Influenza A by PCR NEGATIVE NEGATIVE Final   Influenza B by PCR NEGATIVE NEGATIVE Final    Comment: (NOTE) The Xpert Xpress SARS-CoV-2/FLU/RSV plus assay is intended as an aid in the diagnosis of  influenza from Nasopharyngeal swab specimens and should not be used as a sole basis for treatment. Nasal washings and aspirates are unacceptable for Xpert Xpress SARS-CoV-2/FLU/RSV testing.  Fact Sheet for Patients: BloggerCourse.com  Fact Sheet for Healthcare Providers: SeriousBroker.it  This test is not yet approved or cleared by the [place] FDA and has been authorized for detection and/or diagnosis of SARS-CoV-2 by FDA under an Emergency Use Authorization (EUA). This EUA will remain in effect (meaning  this test can be used) for the duration of the COVID-19 declaration under Section 564(b)(1) of the Act, 21 U.S.C. section 360bbb-3(b)(1), unless the authorization is terminated or revoked.  Performed at [facility], [address], [place], [place] [address]      Labs: Basic Metabolic Panel: Recent Labs  Lab 08/14/21 1659 08/15/21 0327 08/16/21 0512 08/18/21 0308  NA 136 138 139 135  K 3.4* 3.4* 4.2 3.0*  CL 103 106 107 105  CO2 21* 22 23 21*  GLUCOSE 143* 118* 126* 114*  BUN 10 10 10 8   CREATININE 1.13* 1.11* 1.11* 1.03*  CALCIUM 9.5 9.0 8.8* 8.7*  MG  --   --   --  1.5*   Liver Function Tests: Recent Labs  Lab 08/14/21 1659  AST 23  ALT 15  ALKPHOS 118  BILITOT 1.3*  PROT 7.5  ALBUMIN 3.5   Recent Labs  Lab 08/14/21 1659  LIPASE 21   No results for input(s): AMMONIA in the last 168 hours. CBC: Recent Labs  Lab 08/14/21 1659 08/15/21 0327 08/16/21 0512 08/17/21 0423 08/18/21 0308  WBC 18.5* 16.7* 12.9* 13.5* 12.5*  NEUTROABS  --   --   --   --  7.2  HGB 16.9* 15.3* 13.5 13.3 13.1  HCT 49.5* 47.2* 42.5 41.2 38.8  MCV 85.8 90.1 89.3 87.3 86.6  PLT 420* 338 313 309 286   Cardiac Enzymes: No results for input(s): CKTOTAL, CKMB, CKMBINDEX, TROPONINI in the last 168 hours. BNP: BNP (last 3 results) Recent Labs    05/24/21 1747  BNP 133.2*    ProBNP (last 3 results) No results for input(s): PROBNP in the last 8760 hours.  CBG: No results for input(s): GLUCAP in the last 168 hours.     Signed:  05/26/21 MD, PhD, FACP  [facility] 08/18/2021, 9:56 AM

## 2021-08-18 NOTE — Progress Notes (Signed)
PROGRESS NOTE    [name] [name]  JQG:[id number] DOB: 1940/11/08 DOA: 08/14/2021 PCP: Ginger Organ., MD    Chief Complaint  Patient presents with   Abdominal Pain    Brief Narrative:  80 year old female, medical history significant for heart transplant in 2009, on immunosuppressants, followed by Dr. Kirtland Bouchard, Cardiology at [facility], OSA not on CPAP, recurrent DVT on Xarelto, hypertension, hypothyroidism, parathyroidectomy, history of C. difficile infection, anxiety and depression, presented to ED with 4 to 5 days history of initial diarrhea, resolved after Imodium, then followed by constipation, progressive lower abdominal pain, decreased appetite, nausea without vomiting and mild rectal bleeding 48 hours prior to admission.  Admitted for sepsis due to acute sigmoid diverticulitis.  Improving.  Subjective:  Reports feeling weak today, does not feel ready to go home today  left lower ab pain has much improved No n/v  No bm  Assessment & Plan:   Principal Problem:   Sepsis ([place]) Active Problems:   Depression   Heart transplanted ([place])   HTN (hypertension)   AKI (acute kidney injury) ([place])   Acute diverticulitis   History of DVT (deep vein thrombosis)   Hypothyroidism   Sepsis present on admission due to acute sigmoid diverticulitis/dehydration/underlying immunosuppressed status -Met sepsis criteria on admission.  She presented with tachypnea, tachycardia, hypertension, leukocytosis and a source.  CT abdomen showed colonic diverticulosis with mild to moderate severity sigmoid diverticulitis. -She was initially treated with Cipro and Flagyl but due to unknown allergy to Flagyl, antibiotic changed to Zosyn - report history not able to tolerate Augmentin, case discuss with ID, no other good options besides Augmentin/Cipro Flagyl, patient elected to try oral Augmentin -Leukocytosis improving  Hypokalemia/hypomagnesemia Replace , recheck in the morning   CKD  3 A Creatinine at baseline  Hypertension Blood pressure stable on current regimen  History of heart transplant On CellCept and tacrolimus Follows with Dr. Kirtland Bouchard at [facility]  History recurrent DVT On Xarelto  Hypothyroidism Continue Synthroid  OSA: Not on CPAP.  Reports that she had a sleep study done approximately 5 years ago and was told to have mild sleep apnea and did not require CPAP.  Per ED RN, overnight while sleeping, patient desaturates.  Nightly oxygen via nasal cannula.  Advised patient that she needs to follow-up with her PCP to consider repeating the sleep study.  She verbalized understanding  Anxiety and depression: Continue clonazepam and venlafaxine.  Stable.   Moderate hiatal hernia/GERD: Continue H2 blockers.  Tums as needed  moderate sized hiatal hernia, follow-up with [facility]  : Body mass index is 30.11 kg/m.Marland Kitchen    Unresulted Labs (From admission, onward)     Start     Ordered   08/19/21 0500  CBC with Differential/Platelet  Tomorrow morning,   R        08/18/21 1333   08/19/21 1975  Basic metabolic panel  Tomorrow morning,   R        08/18/21 1333   08/19/21 0500  Magnesium  Tomorrow morning,   STAT        08/18/21 1333              DVT prophylaxis: rivaroxaban (XARELTO) tablet 10 mg Start: 08/15/21 1000 rivaroxaban (XARELTO) tablet 10 mg   Code Status: Full Family Communication: Patient Disposition:   Status is: Inpatient  Dispo: The patient is from: Independent living  Anticipated d/c is to: Patient desired to return to independent living with home health PT              Anticipated d/c date is: Likely tomorrow if able to tolerate oral antibiotic                Consultants:  Phone conversation with ID  Procedures:  None  Antimicrobials:   Anti-infectives (From admission, onward)    Start     Dose/Rate Route Frequency Ordered Stop   08/18/21 1045  amoxicillin-clavulanate (AUGMENTIN)  875-125 MG per tablet 1 tablet        1 tablet Oral Every 12 hours 08/18/21 0952 08/23/21 0959   08/18/21 0000  amoxicillin-clavulanate (AUGMENTIN) 875-125 MG tablet        1 tablet Oral Every 12 hours 08/18/21 1131 08/23/21 2359   08/14/21 2330  piperacillin-tazobactam (ZOSYN) IVPB 3.375 g  Status:  Discontinued        3.375 g 12.5 mL/hr over 240 Minutes Intravenous Every 8 hours 08/14/21 2317 08/18/21 0954   08/14/21 2130  ciprofloxacin (CIPRO) IVPB 400 mg  Status:  Discontinued        400 mg 200 mL/hr over 60 Minutes Intravenous  Once 08/14/21 2120 08/15/21 0201   08/14/21 2130  metroNIDAZOLE (FLAGYL) IVPB 500 mg  Status:  Discontinued        500 mg 100 mL/hr over 60 Minutes Intravenous Every 12 hours 08/14/21 2120 08/14/21 2249           Objective: Vitals:   08/17/21 1655 08/17/21 2123 08/18/21 0629 08/18/21 0837  BP: (!) 133/55 (!) 144/60 (!) 127/49 129/60  Pulse: 83 99 73 70  Resp: $Remo'17 18 18 17  'zeFYJ$ Temp: 98.1 F (36.7 C) 97.7 F (36.5 C) 98.7 F (37.1 C) 98.3 F (36.8 C)  TempSrc: Oral Oral Oral Oral  SpO2: 94% 95% 94% 91%  Weight:      Height:        Intake/Output Summary (Last 24 hours) at 08/18/2021 1334 Last data filed at 08/18/2021 1143 Gross per 24 hour  Intake 559.96 ml  Output --  Net 559.96 ml   Filed Weights   08/15/21 0906  Weight: 77.1 kg    Examination:  General exam: alert, awake, communicative,calm, NAD Respiratory system: Clear to auscultation. Respiratory effort normal. Cardiovascular system:  RRR.  Gastrointestinal system: Abdomen is nondistended, soft and nontender.  Normal bowel sounds heard. Central nervous system: Alert and oriented. No focal neurological deficits. Extremities:  no edema Skin: No rashes, lesions or ulcers Psychiatry: Judgement and insight appear normal. Mood & affect appropriate.     Data Reviewed: I have personally reviewed following labs and imaging studies  CBC: Recent Labs  Lab 08/14/21 1659  08/15/21 0327 08/16/21 0512 08/17/21 0423 08/18/21 0308  WBC 18.5* 16.7* 12.9* 13.5* 12.5*  NEUTROABS  --   --   --   --  7.2  HGB 16.9* 15.3* 13.5 13.3 13.1  HCT 49.5* 47.2* 42.5 41.2 38.8  MCV 85.8 90.1 89.3 87.3 86.6  PLT 420* 338 313 309 196    Basic Metabolic Panel: Recent Labs  Lab 08/14/21 1659 08/15/21 0327 08/16/21 0512 08/18/21 0308  NA 136 138 139 135  K 3.4* 3.4* 4.2 3.0*  CL 103 106 107 105  CO2 21* 22 23 21*  GLUCOSE 143* 118* 126* 114*  BUN $Re'10 10 10 8  'kev$ CREATININE 1.13* 1.11* 1.11* 1.03*  CALCIUM 9.5 9.0 8.8* 8.7*  MG  --   --   --  1.5*    GFR: Estimated Creatinine Clearance: 41.4 mL/min (A) (by C-G formula based on SCr of 1.03 mg/dL (H)).  Liver Function Tests: Recent Labs  Lab 08/14/21 1659  AST 23  ALT 15  ALKPHOS 118  BILITOT 1.3*  PROT 7.5  ALBUMIN 3.5    CBG: No results for input(s): GLUCAP in the last 168 hours.   Recent Results (from the past 240 hour(s))  Resp Panel by RT-PCR (Flu A&B, Covid) Nasopharyngeal Swab     Status: None   Collection Time: 08/14/21  9:44 PM   Specimen: Nasopharyngeal Swab; Nasopharyngeal(NP) swabs in vial transport medium  Result Value Ref Range Status   SARS Coronavirus 2 by RT PCR NEGATIVE NEGATIVE Final    Comment: (NOTE) SARS-CoV-2 target nucleic acids are NOT DETECTED.  The SARS-CoV-2 RNA is generally detectable in upper respiratory specimens during the acute phase of infection. The lowest concentration of SARS-CoV-2 viral copies this assay can detect is 138 copies/mL. A negative result does not preclude SARS-Cov-2 infection and should not be used as the sole basis for treatment or other patient management decisions. A negative result may occur with  improper specimen collection/handling, submission of specimen other than nasopharyngeal swab, presence of viral mutation(s) within the areas targeted by this assay, and inadequate number of viral copies(<138 copies/mL). A negative result must be  combined with clinical observations, patient history, and epidemiological information. The expected result is Negative.  Fact Sheet for Patients:  EntrepreneurPulse.com.au  Fact Sheet for Healthcare Providers:  IncredibleEmployment.be  This test is no t yet approved or cleared by the [place] FDA and  has been authorized for detection and/or diagnosis of SARS-CoV-2 by FDA under an Emergency Use Authorization (EUA). This EUA will remain  in effect (meaning this test can be used) for the duration of the COVID-19 declaration under Section 564(b)(1) of the Act, 21 U.S.C.section 360bbb-3(b)(1), unless the authorization is terminated  or revoked sooner.       Influenza A by PCR NEGATIVE NEGATIVE Final   Influenza B by PCR NEGATIVE NEGATIVE Final    Comment: (NOTE) The Xpert Xpress SARS-CoV-2/FLU/RSV plus assay is intended as an aid in the diagnosis of influenza from Nasopharyngeal swab specimens and should not be used as a sole basis for treatment. Nasal washings and aspirates are unacceptable for Xpert Xpress SARS-CoV-2/FLU/RSV testing.  Fact Sheet for Patients: EntrepreneurPulse.com.au  Fact Sheet for Healthcare Providers: IncredibleEmployment.be  This test is not yet approved or cleared by the [place] FDA and has been authorized for detection and/or diagnosis of SARS-CoV-2 by FDA under an Emergency Use Authorization (EUA). This EUA will remain in effect (meaning this test can be used) for the duration of the COVID-19 declaration under Section 564(b)(1) of the Act, 21 U.S.C. section 360bbb-3(b)(1), unless the authorization is terminated or revoked.  Performed at [facility], [place] [address], [place], [place] [address]          Radiology Studies: No results found.      Scheduled Meds:  amoxicillin-clavulanate  1 tablet Oral Q12H   aspirin EC  81 mg Oral Daily   clonazePAM  1  mg Oral QHS   diltiazem  120 mg Oral BID   docusate sodium  100 mg Oral BID   famotidine  20 mg Oral BID   iron polysaccharides  150 mg Oral Daily   levothyroxine  50 mcg Oral QAC breakfast   losartan  50 mg Oral BID   mycophenolate  250 mg  Oral BID   pantoprazole  40 mg Oral BID   rivaroxaban  10 mg Oral Daily   saccharomyces boulardii  250 mg Oral BID   tacrolimus  0.5 mg Oral BID   venlafaxine XR  75 mg Oral Q breakfast   vitamin B-12  500 mcg Oral Daily   Continuous Infusions:  promethazine (PHENERGAN) injection (IM or IVPB)       LOS: 3 days   Time spent: 77mins Greater than 50% of this time was spent in counseling, explanation of diagnosis, planning of further management, and coordination of care.   Voice Recognition Viviann Spare dictation system was used to create this note, attempts have been made to correct errors. Please contact the author with questions and/or clarifications.   Florencia Reasons, MD PhD FACP [facility]  Available via Epic secure chat 7am-7pm for nonurgent issues Please page for urgent issues To page the attending provider between 7A-7P or the covering provider during after hours 7P-7A, please log into the web site [email or web address] and access using universal [place] password for that web site. If you do not have the password, please call the hospital operator.    08/18/2021, 1:34 PM

## 2021-08-18 NOTE — Care Management Important Message (Signed)
Important Message  Patient Details  Name: Caileen Veracruz MRN: [id number] Date of Birth: 16-Sep-1940   Medicare Important Message Given:  Yes     Dorena Bodo 08/18/2021, 2:42 PM

## 2021-08-19 DIAGNOSIS — K5792 Diverticulitis of intestine, part unspecified, without perforation or abscess without bleeding: Secondary | ICD-10-CM | POA: Diagnosis not present

## 2021-08-19 DIAGNOSIS — Z941 Heart transplant status: Secondary | ICD-10-CM | POA: Diagnosis not present

## 2021-08-19 DIAGNOSIS — R531 Weakness: Secondary | ICD-10-CM | POA: Diagnosis not present

## 2021-08-19 DIAGNOSIS — D849 Immunodeficiency, unspecified: Secondary | ICD-10-CM | POA: Diagnosis not present

## 2021-08-19 DIAGNOSIS — Z86718 Personal history of other venous thrombosis and embolism: Secondary | ICD-10-CM | POA: Diagnosis not present

## 2021-08-19 DIAGNOSIS — A419 Sepsis, unspecified organism: Secondary | ICD-10-CM | POA: Diagnosis not present

## 2021-08-19 LAB — MAGNESIUM: Magnesium: 1.7 mg/dL (ref 1.7–2.4)

## 2021-08-19 LAB — CBC WITH DIFFERENTIAL/PLATELET
Abs Immature Granulocytes: 0.04 10*3/uL (ref 0.00–0.07)
Basophils Absolute: 0.1 10*3/uL (ref 0.0–0.1)
Basophils Relative: 1 %
Eosinophils Absolute: 0.3 10*3/uL (ref 0.0–0.5)
Eosinophils Relative: 2 %
HCT: 41.7 % (ref 36.0–46.0)
Hemoglobin: 13.6 g/dL (ref 12.0–15.0)
Immature Granulocytes: 0 %
Lymphocytes Relative: 25 %
Lymphs Abs: 3.2 10*3/uL (ref 0.7–4.0)
MCH: 28.7 pg (ref 26.0–34.0)
MCHC: 32.6 g/dL (ref 30.0–36.0)
MCV: 88 fL (ref 80.0–100.0)
Monocytes Absolute: 1.5 10*3/uL — ABNORMAL HIGH (ref 0.1–1.0)
Monocytes Relative: 12 %
Neutro Abs: 7.5 10*3/uL (ref 1.7–7.7)
Neutrophils Relative %: 60 %
Platelets: 321 10*3/uL (ref 150–400)
RBC: 4.74 MIL/uL (ref 3.87–5.11)
RDW: 13.6 % (ref 11.5–15.5)
WBC: 12.6 10*3/uL — ABNORMAL HIGH (ref 4.0–10.5)
nRBC: 0 % (ref 0.0–0.2)

## 2021-08-19 LAB — BASIC METABOLIC PANEL
Anion gap: 7 (ref 5–15)
BUN: 9 mg/dL (ref 8–23)
CO2: 25 mmol/L (ref 22–32)
Calcium: 8.7 mg/dL — ABNORMAL LOW (ref 8.9–10.3)
Chloride: 105 mmol/L (ref 98–111)
Creatinine, Ser: 1.08 mg/dL — ABNORMAL HIGH (ref 0.44–1.00)
GFR, Estimated: 51 mL/min — ABNORMAL LOW (ref >60)
Glucose, Bld: 115 mg/dL — ABNORMAL HIGH (ref 70–99)
Potassium: 3.9 mmol/L (ref 3.5–5.1)
Sodium: 137 mmol/L (ref 135–145)

## 2021-08-19 MED ORDER — MAGNESIUM OXIDE -MG SUPPLEMENT 400 (240 MG) MG PO TABS
400.0000 mg | ORAL_TABLET | Freq: Every day | ORAL | Status: DC
  Administered 2021-08-19: 400 mg via ORAL
  Filled 2021-08-19: qty 1

## 2021-08-19 MED ORDER — MAGNESIUM SULFATE 2 GM/50ML IV SOLN
2.0000 g | Freq: Once | INTRAVENOUS | Status: AC
  Administered 2021-08-19: 2 g via INTRAVENOUS
  Filled 2021-08-19: qty 50

## 2021-08-19 MED ORDER — BISACODYL 10 MG RE SUPP
10.0000 mg | Freq: Every day | RECTAL | Status: DC
  Administered 2021-08-19: 10 mg via RECTAL
  Filled 2021-08-19: qty 1

## 2021-08-19 MED ORDER — POLYETHYLENE GLYCOL 3350 17 G PO PACK
17.0000 g | PACK | Freq: Every day | ORAL | Status: DC
  Administered 2021-08-19: 17 g via ORAL
  Filled 2021-08-19: qty 1

## 2021-08-19 NOTE — Discharge Summary (Signed)
Discharge Summary  [name] [name] SAY:[id number] DOB: Jan 03, 1941  PCP: Ginger Organ., MD  Admit date: 08/14/2021 Discharge date: 08/19/2021  Time spent:  35mns  Recommendations for Outpatient Follow-up:  F/u with PCP within a week  for hospital discharge follow up, repeat cbc/bmp at follow up Follow-up with [facility] for moderate hiatal hernia, acid reflux and diverticulosis Follow[facility]    Discharge Diagnoses:  Active Hospital Problems   Diagnosis Date Noted   Sepsis ([place] 08/14/2021   AKI (acute kidney injury) ([place] 08/15/2021   Acute diverticulitis 08/15/2021   History of DVT (deep vein thrombosis) 08/15/2021   Hypothyroidism 08/15/2021   Heart [place] ([place]    HTN (hypertension)    Depression     Resolved Hospital Problems  No resolved problems to display.    Discharge Condition: stable  Diet recommendation: heart healthy  Filed Weights   08/15/21 0906 08/19/21 0537  Weight: 77.1 kg 77.8 kg    History of present illness: (Per admitting MD Dr. TFlossie Buffy Chief Complaint: diarrhea and abdominal pain   HPI: MJanise [name] a 80y.o. female with medical history significant for heart transplant in 2009, OSA not CPAP, recurrent DVT on Xarelto, hypertension, hypothyroidism, parathyroidectomy, history of C. difficile who presents with concerns of diarrhea and abdominal pain.   About 4 days ago she began to note diarrhea and tried to take Imodium without relief.  She normally deals with alternating bouts of constipation and diarrhea due to taking magnesium supplementation.  She has felt chills but no objective fever. Also had nausea and unable to eat for 4 days. She became more concerned when she started to note left-sided abdominal pain and bloody diarrhea yesterday. Denies recent antibiotic use. Still has been able to keep down her immunosuppressive  medications.   ED Course: She was afebrile, hypertensive with systolic of 1235over 857Don room air.  She was tachycardic with heart rate of 105, RR 22.  WBC of 18.5, hemoglobin of 16.9 Sodium of 136, K of 3.4, creatinine of 1.13 up from 1.07, BG of 143  CT of the abdomen shows mild to moderate severity sigmoid diverticulitis.   IV Cipro and Flagyl was ordered and hospitalist called for admission.  Hospital Course:  Principal Problem:   Sepsis ([place] Active Problems:   Depression   Heart transplanted ([place]   HTN (hypertension)   AKI (acute kidney injury) ([place]   Acute diverticulitis   History of DVT (deep vein thrombosis)   Hypothyroidism   Sepsis present on admission due to acute sigmoid diverticulitis/dehydration/underlying immunosuppressed status -Met sepsis criteria on admission.  She presented with tachypnea, tachycardia, hypertension, leukocytosis and a source.  CT abdomen showed colonic diverticulosis with mild to moderate severity sigmoid diverticulitis. -She was initially treated with Cipro and Flagyl but due to unknown allergy to Flagyl, antibiotic changed to Zosyn - report history not able to tolerate Augmentin, case discuss with ID, no other good options besides Augmentin/Cipro Flagyl, patient elected to try oral Augmentin -Leukocytosis improving, n/v ab pain has resolved, tolerating regular diet, she desired to go home. She is discharged on augmentin , she is advised to f/u with pcp closely.   Hypokalemia/hypomagnesemia Replace , improved   CKD 3 A Creatinine at baseline   Hypertension Blood pressure stable on current regimen   History of heart transplant On CellCept and tacrolimus [facility]   History recurrent DVT On Xarelto   Hypothyroidism Continue Synthroid   OSA: Not on CPAP.  Reports that she had a sleep study done approximately 5 years ago and was told to have mild sleep apnea and did not require  CPAP.  Per ED RN, overnight while sleeping, patient desaturates.  Nightly oxygen via nasal cannula.  Advised patient that she needs to follow-up with her PCP to consider repeating the sleep study.  She verbalized understanding  Anxiety and depression: Continue clonazepam and venlafaxine.  Stable.   Moderate hiatal hernia/GERD: Continue H2 blockers.  Tums as needed  moderate sized hiatal hernia, follow-up with [facility]   : Body mass index is 30.11 kg/m.Marland Kitchen     Procedures: none  Consultations: none  Discharge Exam: BP (!) 119/52    Pulse 71    Temp 98.2 F (36.8 C) (Oral)    Resp 16    Ht _0  (1.6 m)    Wt 77.8 kg    SpO2 96%    BMI 30.38 kg/m   General: NAD, pleasant  Cardiovascular: RRR Respiratory: normal respiratory effort  Discharge Instructions You were cared for by a hospitalist during your hospital stay. If you have any questions about your discharge medications or the care you received while you were in the hospital after you are discharged, you can call the unit and asked to speak with the hospitalist on call if the hospitalist that took care of you is not available. Once you are discharged, your primary care physician will handle any further medical issues. Please note that NO REFILLS for any discharge medications will be authorized once you are discharged, as it is imperative that you return to your primary care physician (or establish a relationship with a primary care physician if you do not have one) for your aftercare needs so that they can reassess your need for medications and monitor your lab values.  Discharge Instructions     Diet - low sodium heart healthy   Complete by: As directed    Increase activity slowly   Complete by: As directed    Increase activity slowly   Complete by: As directed       Allergies as of 08/19/2021       Reactions   Antihistamines, Chlorpheniramine-type Other (See Comments)   Numbness   Augmentin [amoxicillin-pot Clavulanate]     Nausea only   Cyclobenzaprine Other (See Comments)   Metronidazole    Unknown reaction    Valtrex [valacyclovir]    "Made my chin numb"   Codeine Nausea Only        Medication List     STOP taking these medications    cephALEXin 500 MG capsule Commonly known as: KEFLEX   ciprofloxacin 500 MG tablet Commonly known as: CIPRO   metroNIDAZOLE 500 MG tablet Commonly known as: FLAGYL   traMADol 50 MG tablet Commonly known as: ULTRAM   vancomycin 50 mg/mL Soln oral solution Commonly known as: VANCOCIN       TAKE these medications    acetaminophen 500 MG tablet Commonly known as: TYLENOL Take by mouth.   Align Chew Chew by mouth.   amoxicillin-clavulanate 875-125 MG tablet Commonly known as: AUGMENTIN Take 1 tablet by mouth every 12 (twelve) hours for 5 days.   aspirin 81 MG tablet Take 81 mg by mouth daily.   Breo Ellipta 100-25 MCG/ACT Aepb Generic drug: fluticasone furoate-vilanterol Inhale 1 puff into the lungs daily.   calcium carbonate 500  MG chewable tablet Commonly known as: TUMS - dosed in mg elemental calcium Chew 1 tablet (200 mg of elemental calcium total) by mouth 3 (three) times daily as needed for indigestion or heartburn.   clonazePAM 1 MG tablet Commonly known as: KLONOPIN Take 1 mg by mouth at bedtime.   desvenlafaxine 50 MG 24 hr tablet Commonly known as: PRISTIQ Take 50 mg by mouth daily.   diltiazem 120 MG 24 hr capsule Commonly known as: CARDIZEM CD Take 120 mg by mouth 2 (two) times daily.   famotidine 20 MG tablet Commonly known as: PEPCID Take 20 mg by mouth daily.   Hyoscyamine Sulfate SL 0.125 MG Subl Commonly known as: Levsin/SL Place 0.125 mg under the tongue every 4 (four) hours as needed (pain). Up to 1.25 mg daily   iron polysaccharides 150 MG capsule Commonly known as: NIFEREX Take 150 mg by mouth daily.   levothyroxine 50 MCG tablet Commonly known as: SYNTHROID Take 50 mcg by mouth daily before  breakfast.   loperamide 2 MG capsule Commonly known as: IMODIUM Take 2 mg by mouth as needed for diarrhea or loose stools.   losartan 100 MG tablet Commonly known as: COZAAR Take 100 mg by mouth 2 (two) times daily.   magnesium oxide 400 MG tablet Commonly known as: MAG-OX Take 400 mg by mouth daily.   MULTIVITAMIN PO Take 1 tablet by mouth daily.   mycophenolate 250 MG capsule Commonly known as: CELLCEPT Take 1 capsule by mouth 2 (two) times daily.   mycophenolate 250 MG capsule Commonly known as: CELLCEPT Take 250 mg by mouth 2 (two) times daily.   omeprazole 20 MG capsule Commonly known as: PRILOSEC Take by mouth.   phenazopyridine 200 MG tablet Commonly known as: PYRIDIUM Take 1 tablet (200 mg total) by mouth 3 (three) times daily.   promethazine 12.5 MG tablet Commonly known as: PHENERGAN Take 1 tablet (12.5 mg total) by mouth every 6 (six) hours as needed for nausea or vomiting.   rivaroxaban 20 MG Tabs tablet Commonly known as: XARELTO Take 20 mg by mouth daily with supper.   saccharomyces boulardii 250 MG capsule Commonly known as: FLORASTOR Take 1 capsule (250 mg total) by mouth 2 (two) times daily.   simethicone 80 MG chewable tablet Commonly known as: Gas-X Chew 1 tablet (80 mg total) by mouth every 6 (six) hours as needed for flatulence.   tacrolimus 0.5 MG capsule Commonly known as: PROGRAF Take 0.5 mg by mouth 2 (two) times daily.   vitamin B-12 500 MCG tablet Commonly known as: CYANOCOBALAMIN Take 500 mcg by mouth daily.   Vitamin D (Ergocalciferol) 1.25 MG (50000 UNIT) Caps capsule Commonly known as: DRISDOL Take 50,000 Units by mouth every Friday.       Allergies  Allergen Reactions   Antihistamines, Chlorpheniramine-Type Other (See Comments)    Numbness   Augmentin [Amoxicillin-Pot Clavulanate]     Nausea only   Cyclobenzaprine Other (See Comments)   Metronidazole     Unknown reaction    Valtrex [Valacyclovir]     "Made my  chin numb"   Codeine Nausea Only    Follow-up Information     Ginger Organ., MD Follow up in 1 week(s).   Specialty: Internal Medicine Why: hospital discharge follow up , repeat cbc/bmp/mag at follow up pcp to refer you to have sleep study Contact information: [place] [address] [phone]         [facility]enter[facility], [facility] Follow up in  2 month(s).   Why: moderate hiatal hernia, acid reflux and diverticulosis Contact information: [address] [place] [address] [phone]         Lillia Mountain, MD Follow up.   Specialty: Cardiology Contact information: [place] [address] [phone]                  The results of significant diagnostics from this hospitalization (including imaging, microbiology, ancillary and laboratory) are listed below for reference.    Significant Diagnostic Studies: CT ABDOMEN PELVIS W CONTRAST  Result Date: 08/14/2021 CLINICAL DATA:  Lower abdominal pain and nausea. EXAM: CT ABDOMEN AND PELVIS WITH CONTRAST TECHNIQUE: Multidetector CT imaging of the abdomen and pelvis was performed using the standard protocol following bolus administration of intravenous contrast. CONTRAST:  47m OMNIPAQUE IOHEXOL 300 MG/ML  SOLN COMPARISON:  None. FINDINGS: Lower chest: A stable 1.1 cm slightly nodular appearing area of linear scarring is seen within the right lung base. Hepatobiliary: No focal liver abnormality is seen. No gallstones, gallbladder wall thickening, or biliary dilatation. Pancreas: Unremarkable. No pancreatic ductal dilatation or surrounding inflammatory changes. Spleen: Normal in size without focal abnormality. Adrenals/Urinary Tract: Adrenal glands are unremarkable. Kidneys are normal in size, without renal calculi or hydronephrosis. Subcentimeter cystic appearing areas are seen within the upper pole of the right kidney and posterior aspect of the mid left kidney. Bladder  is unremarkable. Stomach/Bowel: There is a moderate sized hiatal hernia. The appendix is surgically absent. No evidence of bowel dilatation. Numerous diverticula are seen throughout the large bowel. Mild to moderate severity thickening of the proximal to mid sigmoid colon is also seen. Vascular/Lymphatic: No significant vascular findings are present. No enlarged abdominal or pelvic lymph nodes. Reproductive: Status post hysterectomy. No adnexal masses. Other: No abdominal wall hernia or abnormality. No abdominopelvic ascites. Musculoskeletal: No acute or significant osseous findings. IMPRESSION: 1. Colonic diverticulosis with mild to moderate severity sigmoid diverticulitis. 2. Moderate sized hiatal hernia. Electronically Signed   By: TVirgina NorfolkM.D.   On: 08/14/2021 20:45    Microbiology: Recent Results (from the past 240 hour(s))  Resp Panel by RT-PCR (Flu A&B, Covid) Nasopharyngeal Swab     Status: None   Collection Time: 08/14/21  9:44 PM   Specimen: Nasopharyngeal Swab; Nasopharyngeal(NP) swabs in vial transport medium  Result Value Ref Range Status   SARS Coronavirus 2 by RT PCR NEGATIVE NEGATIVE Final    Comment: (NOTE) SARS-CoV-2 target nucleic acids are NOT DETECTED.  The SARS-CoV-2 RNA is generally detectable in upper respiratory specimens during the acute phase of infection. The lowest concentration of SARS-CoV-2 viral copies this assay can detect is 138 copies/mL. A negative result does not preclude SARS-Cov-2 infection and should not be used as the sole basis for treatment or other patient management decisions. A negative result may occur with  improper specimen collection/handling, submission of specimen other than nasopharyngeal swab, presence of viral mutation(s) within the areas targeted by this assay, and inadequate number of viral copies(<138 copies/mL). A negative result must be combined with clinical observations, patient history, and epidemiological information.  The expected result is Negative.  Fact Sheet for Patients:  hEntrepreneurPulse.com.au Fact Sheet for Healthcare Providers:  hIncredibleEmployment.be This test is no t yet approved or cleared by the [place] and  has been authorized for detection and/or diagnosis of SARS-CoV-2 by FDA under an Emergency Use Authorization (EUA). This EUA will remain  in effect (meaning this test can  be used) for the duration of the COVID-19 declaration under Section 564(b)(1) of the Act, 21 U.S.C.section 360bbb-3(b)(1), unless the authorization is terminated  or revoked sooner.       Influenza A by PCR NEGATIVE NEGATIVE Final   Influenza B by PCR NEGATIVE NEGATIVE Final    Comment: (NOTE) The Xpert Xpress SARS-CoV-2/FLU/RSV plus assay is intended as an aid in the diagnosis of influenza from Nasopharyngeal swab specimens and should not be used as a sole basis for treatment. Nasal washings and aspirates are unacceptable for Xpert Xpress SARS-CoV-2/FLU/RSV testing.  Fact Sheet for Patients: EntrepreneurPulse.com.au  Fact Sheet for Healthcare Providers: IncredibleEmployment.be  This test is not yet approved or cleared by the [place] FDA and has been authorized for detection and/or diagnosis of SARS-CoV-2 by FDA under an Emergency Use Authorization (EUA). This EUA will remain in effect (meaning this test can be used) for the duration of the COVID-19 declaration under Section 564(b)(1) of the Act, 21 U.S.C. section 360bbb-3(b)(1), unless the authorization is terminated or revoked.  Performed at [facility], [place] [address], [place],  [address]      Labs: Basic Metabolic Panel: Recent Labs  Lab 08/14/21 1659 08/15/21 0327 08/16/21 0512 08/18/21 0308 08/19/21 0330  NA 136 138 139 135 137  K 3.4* 3.4* 4.2 3.0* 3.9  CL 103 106 107 105 105  CO2 21* 22 23 21* 25  GLUCOSE 143* 118* 126* 114*  115*  BUN _0 CREATININE 1.13* 1.11* 1.11* 1.03* 1.08*  CALCIUM 9.5 9.0 8.8* 8.7* 8.7*  MG  --   --   --  1.5* 1.7   Liver Function Tests: Recent Labs  Lab 08/14/21 1659  AST 23  ALT 15  ALKPHOS 118  BILITOT 1.3*  PROT 7.5  ALBUMIN 3.5   Recent Labs  Lab 08/14/21 1659  LIPASE 21   No results for input(s): AMMONIA in the last 168 hours. CBC: Recent Labs  Lab 08/15/21 0327 08/16/21 0512 08/17/21 0423 08/18/21 0308 08/19/21 0330  WBC 16.7* 12.9* 13.5* 12.5* 12.6*  NEUTROABS  --   --   --  7.2 7.5  HGB 15.3* 13.5 13.3 13.1 13.6  HCT 47.2* 42.5 41.2 38.8 41.7  MCV 90.1 89.3 87.3 86.6 88.0  PLT 338 313 309 286 321   Cardiac Enzymes: No results for input(s): CKTOTAL, CKMB, CKMBINDEX, TROPONINI in the last 168 hours. BNP: BNP (last 3 results) Recent Labs    05/24/21 1747  BNP 133.2*    ProBNP (last 3 results) No results for input(s): PROBNP in the last 8760 hours.  CBG: No results for input(s): GLUCAP in the last 168 hours.     Signed:  Florencia Reasons MD, PhD, FACP  [facility] 08/19/2021, 10:10 PM

## 2021-08-19 NOTE — TOC Transition Note (Signed)
Transition of Care [facility]) - CM/SW Discharge Note   Patient Details  Name: [name] [name] MRN: [id number] Date of Birth: 06-08-1941  Transition of Care [facility]) CM/SW Contact:  Tom-Johnson, Hershal Coria, RN Phone Number: 08/19/2021, 10:47 AM   Clinical Narrative:    Patient is scheduled for discharge today. Will be returning to [facility]. CM called and spoke with husband [name] [name], and notify him of discharge. CM called ans spoke with [name] [name] at [facility] and notify her of patient's discharge and also that patient will be needing PT services. [name] [name] states patient had used [facility] before and needs PT order sent with patient. Order placed on patient's discharge packet. Grandson to transport at discharge. No further TOC needs noted.    Final next level of care: Other (comment) (Independent Living) Barriers to Discharge: Barriers Resolved   Patient Goals and CMS Choice Patient states their goals for this hospitalization and ongoing recovery are:: To return to [facility] CMS Medicare.gov Compare Post Acute Care list provided to:: Patient Choice offered to / list presented to : Patient  Discharge Placement                       Discharge Plan and Services                DME Arranged: N/A DME Agency: NA       HH Arranged: PT HH Agency: Other - See comment [facility].) Date HH Agency Contacted: 08/19/21 Time HH Agency Contacted: 1046 Representative spoke with at [place] Agency: [name] [name]  Social Determinants of Health (SDOH) Interventions     Readmission Risk Interventions No flowsheet data found.

## 2021-08-19 NOTE — Progress Notes (Signed)
DISCHARGE NOTE HOME [name] [name] to be discharged Home per MD order. Discussed prescriptions and follow up appointments with the patient. Prescriptions given to patient; medication list explained in detail. Patient verbalized understanding.  Skin clean, dry and intact without evidence of skin break down, no evidence of skin tears noted. IV catheter discontinued intact. Site without signs and symptoms of complications. Dressing and pressure applied. Pt denies pain at the site currently. No complaints noted.  Patient free of lines, drains, and wounds.   An After Visit Summary (AVS) was printed and given to the patient. Patient escorted via wheelchair, and discharged home via private auto.  Lorine Bears, RN

## 2021-08-22 DIAGNOSIS — Z20828 Contact with and (suspected) exposure to other viral communicable diseases: Secondary | ICD-10-CM | POA: Diagnosis not present

## 2021-08-22 DIAGNOSIS — Z1159 Encounter for screening for other viral diseases: Secondary | ICD-10-CM | POA: Diagnosis not present

## 2021-08-31 ENCOUNTER — Other Ambulatory Visit: Payer: Self-pay | Admitting: Internal Medicine

## 2021-08-31 DIAGNOSIS — R911 Solitary pulmonary nodule: Secondary | ICD-10-CM

## 2021-09-05 DIAGNOSIS — M810 Age-related osteoporosis without current pathological fracture: Secondary | ICD-10-CM | POA: Diagnosis not present

## 2021-09-05 DIAGNOSIS — E785 Hyperlipidemia, unspecified: Secondary | ICD-10-CM | POA: Diagnosis not present

## 2021-09-05 DIAGNOSIS — I10 Essential (primary) hypertension: Secondary | ICD-10-CM | POA: Diagnosis not present

## 2021-09-05 DIAGNOSIS — E039 Hypothyroidism, unspecified: Secondary | ICD-10-CM | POA: Diagnosis not present

## 2021-09-07 DIAGNOSIS — R42 Dizziness and giddiness: Secondary | ICD-10-CM | POA: Diagnosis not present

## 2021-09-07 DIAGNOSIS — M6281 Muscle weakness (generalized): Secondary | ICD-10-CM | POA: Diagnosis not present

## 2021-09-07 DIAGNOSIS — R2689 Other abnormalities of gait and mobility: Secondary | ICD-10-CM | POA: Diagnosis not present

## 2021-09-09 DIAGNOSIS — R7301 Impaired fasting glucose: Secondary | ICD-10-CM | POA: Diagnosis not present

## 2021-09-09 DIAGNOSIS — Z0000 Encounter for general adult medical examination without abnormal findings: Secondary | ICD-10-CM | POA: Diagnosis not present

## 2021-09-09 DIAGNOSIS — E785 Hyperlipidemia, unspecified: Secondary | ICD-10-CM | POA: Diagnosis not present

## 2021-09-09 DIAGNOSIS — I10 Essential (primary) hypertension: Secondary | ICD-10-CM | POA: Diagnosis not present

## 2021-09-13 ENCOUNTER — Other Ambulatory Visit (HOSPITAL_COMMUNITY): Payer: Self-pay

## 2021-09-16 DIAGNOSIS — I10 Essential (primary) hypertension: Secondary | ICD-10-CM | POA: Diagnosis not present

## 2021-09-16 DIAGNOSIS — R82998 Other abnormal findings in urine: Secondary | ICD-10-CM | POA: Diagnosis not present

## 2021-09-16 DIAGNOSIS — D509 Iron deficiency anemia, unspecified: Secondary | ICD-10-CM | POA: Diagnosis not present

## 2021-09-16 DIAGNOSIS — E611 Iron deficiency: Secondary | ICD-10-CM | POA: Diagnosis not present

## 2021-09-19 DIAGNOSIS — Z20828 Contact with and (suspected) exposure to other viral communicable diseases: Secondary | ICD-10-CM | POA: Diagnosis not present

## 2021-09-23 ENCOUNTER — Other Ambulatory Visit: Payer: Self-pay

## 2021-09-23 ENCOUNTER — Ambulatory Visit
Admission: RE | Admit: 2021-09-23 | Discharge: 2021-09-23 | Disposition: A | Discharge status: Home / Self Care | Payer: Medicare Other | Source: Ambulatory Visit | Attending: Internal Medicine | Admitting: Internal Medicine

## 2021-09-23 DIAGNOSIS — R911 Solitary pulmonary nodule: Secondary | ICD-10-CM

## 2021-09-27 ENCOUNTER — Encounter (HOSPITAL_COMMUNITY): Payer: Medicare Other

## 2021-09-28 ENCOUNTER — Other Ambulatory Visit (HOSPITAL_COMMUNITY): Payer: Self-pay

## 2021-09-29 ENCOUNTER — Other Ambulatory Visit: Payer: Self-pay

## 2021-09-29 ENCOUNTER — Encounter (HOSPITAL_COMMUNITY)
Admission: RE | Admit: 2021-09-29 | Discharge: 2021-09-29 | Disposition: A | Discharge status: Home / Self Care | Payer: Medicare Other | Source: Ambulatory Visit | Attending: Internal Medicine | Admitting: Internal Medicine

## 2021-09-29 DIAGNOSIS — D649 Anemia, unspecified: Secondary | ICD-10-CM | POA: Insufficient documentation

## 2021-09-29 MED ORDER — SODIUM CHLORIDE 0.9 % IV SOLN
510.0000 mg | INTRAVENOUS | Status: DC
  Administered 2021-09-29: 510 mg via INTRAVENOUS
  Filled 2021-09-29: qty 510

## 2021-10-05 DIAGNOSIS — R42 Dizziness and giddiness: Secondary | ICD-10-CM | POA: Diagnosis not present

## 2021-10-05 DIAGNOSIS — R2689 Other abnormalities of gait and mobility: Secondary | ICD-10-CM | POA: Diagnosis not present

## 2021-10-05 DIAGNOSIS — M6281 Muscle weakness (generalized): Secondary | ICD-10-CM | POA: Diagnosis not present

## 2021-10-06 ENCOUNTER — Encounter (HOSPITAL_COMMUNITY)
Admission: RE | Admit: 2021-10-06 | Discharge: 2021-10-06 | Disposition: A | Discharge status: Home / Self Care | Payer: Medicare Other | Source: Ambulatory Visit | Attending: Internal Medicine | Admitting: Internal Medicine

## 2021-10-06 DIAGNOSIS — D649 Anemia, unspecified: Secondary | ICD-10-CM | POA: Diagnosis not present

## 2021-10-06 MED ORDER — SODIUM CHLORIDE 0.9 % IV SOLN
510.0000 mg | INTRAVENOUS | Status: DC
  Administered 2021-10-06: 510 mg via INTRAVENOUS
  Filled 2021-10-06: qty 510

## 2021-10-07 DIAGNOSIS — R2689 Other abnormalities of gait and mobility: Secondary | ICD-10-CM | POA: Diagnosis not present

## 2021-10-07 DIAGNOSIS — R42 Dizziness and giddiness: Secondary | ICD-10-CM | POA: Diagnosis not present

## 2021-10-07 DIAGNOSIS — M6281 Muscle weakness (generalized): Secondary | ICD-10-CM | POA: Diagnosis not present

## 2021-10-11 DIAGNOSIS — R42 Dizziness and giddiness: Secondary | ICD-10-CM | POA: Diagnosis not present

## 2021-10-11 DIAGNOSIS — R2689 Other abnormalities of gait and mobility: Secondary | ICD-10-CM | POA: Diagnosis not present

## 2021-10-11 DIAGNOSIS — M6281 Muscle weakness (generalized): Secondary | ICD-10-CM | POA: Diagnosis not present

## 2021-10-17 DIAGNOSIS — M6281 Muscle weakness (generalized): Secondary | ICD-10-CM | POA: Diagnosis not present

## 2021-10-17 DIAGNOSIS — R42 Dizziness and giddiness: Secondary | ICD-10-CM | POA: Diagnosis not present

## 2021-10-17 DIAGNOSIS — R2689 Other abnormalities of gait and mobility: Secondary | ICD-10-CM | POA: Diagnosis not present

## 2021-10-18 ENCOUNTER — Other Ambulatory Visit: Payer: Self-pay | Admitting: Internal Medicine

## 2021-10-18 DIAGNOSIS — Z1231 Encounter for screening mammogram for malignant neoplasm of breast: Secondary | ICD-10-CM

## 2021-10-20 DIAGNOSIS — L570 Actinic keratosis: Secondary | ICD-10-CM | POA: Diagnosis not present

## 2021-10-20 DIAGNOSIS — D225 Melanocytic nevi of trunk: Secondary | ICD-10-CM | POA: Diagnosis not present

## 2021-10-20 DIAGNOSIS — D224 Melanocytic nevi of scalp and neck: Secondary | ICD-10-CM | POA: Diagnosis not present

## 2021-10-20 DIAGNOSIS — L821 Other seborrheic keratosis: Secondary | ICD-10-CM | POA: Diagnosis not present

## 2021-10-21 DIAGNOSIS — J069 Acute upper respiratory infection, unspecified: Secondary | ICD-10-CM | POA: Diagnosis not present

## 2021-10-21 DIAGNOSIS — Z941 Heart transplant status: Secondary | ICD-10-CM | POA: Diagnosis not present

## 2021-10-21 DIAGNOSIS — R519 Headache, unspecified: Secondary | ICD-10-CM | POA: Diagnosis not present

## 2021-10-21 DIAGNOSIS — I10 Essential (primary) hypertension: Secondary | ICD-10-CM | POA: Diagnosis not present

## 2021-10-24 DIAGNOSIS — R42 Dizziness and giddiness: Secondary | ICD-10-CM | POA: Diagnosis not present

## 2021-10-24 DIAGNOSIS — R2689 Other abnormalities of gait and mobility: Secondary | ICD-10-CM | POA: Diagnosis not present

## 2021-10-24 DIAGNOSIS — M6281 Muscle weakness (generalized): Secondary | ICD-10-CM | POA: Diagnosis not present

## 2021-10-31 ENCOUNTER — Ambulatory Visit
Admission: RE | Admit: 2021-10-31 | Discharge: 2021-10-31 | Disposition: A | Discharge status: Home / Self Care | Payer: Medicare Other | Source: Ambulatory Visit | Attending: Internal Medicine | Admitting: Internal Medicine

## 2021-10-31 DIAGNOSIS — Z1231 Encounter for screening mammogram for malignant neoplasm of breast: Secondary | ICD-10-CM | POA: Diagnosis not present

## 2021-11-01 DIAGNOSIS — R2689 Other abnormalities of gait and mobility: Secondary | ICD-10-CM | POA: Diagnosis not present

## 2021-11-01 DIAGNOSIS — R42 Dizziness and giddiness: Secondary | ICD-10-CM | POA: Diagnosis not present

## 2021-11-01 DIAGNOSIS — M6281 Muscle weakness (generalized): Secondary | ICD-10-CM | POA: Diagnosis not present

## 2021-11-03 DIAGNOSIS — M6281 Muscle weakness (generalized): Secondary | ICD-10-CM | POA: Diagnosis not present

## 2021-11-03 DIAGNOSIS — R2689 Other abnormalities of gait and mobility: Secondary | ICD-10-CM | POA: Diagnosis not present

## 2021-11-03 DIAGNOSIS — R42 Dizziness and giddiness: Secondary | ICD-10-CM | POA: Diagnosis not present

## 2021-11-08 DIAGNOSIS — R2689 Other abnormalities of gait and mobility: Secondary | ICD-10-CM | POA: Diagnosis not present

## 2021-11-08 DIAGNOSIS — M6281 Muscle weakness (generalized): Secondary | ICD-10-CM | POA: Diagnosis not present

## 2021-11-08 DIAGNOSIS — R42 Dizziness and giddiness: Secondary | ICD-10-CM | POA: Diagnosis not present

## 2021-11-21 DIAGNOSIS — R42 Dizziness and giddiness: Secondary | ICD-10-CM | POA: Diagnosis not present

## 2021-11-21 DIAGNOSIS — M6281 Muscle weakness (generalized): Secondary | ICD-10-CM | POA: Diagnosis not present

## 2021-11-21 DIAGNOSIS — R2689 Other abnormalities of gait and mobility: Secondary | ICD-10-CM | POA: Diagnosis not present

## 2021-11-25 DIAGNOSIS — R2689 Other abnormalities of gait and mobility: Secondary | ICD-10-CM | POA: Diagnosis not present

## 2021-11-25 DIAGNOSIS — R42 Dizziness and giddiness: Secondary | ICD-10-CM | POA: Diagnosis not present

## 2021-11-25 DIAGNOSIS — M6281 Muscle weakness (generalized): Secondary | ICD-10-CM | POA: Diagnosis not present

## 2021-11-28 DIAGNOSIS — K5730 Diverticulosis of large intestine without perforation or abscess without bleeding: Secondary | ICD-10-CM | POA: Diagnosis not present

## 2021-11-28 DIAGNOSIS — R198 Other specified symptoms and signs involving the digestive system and abdomen: Secondary | ICD-10-CM | POA: Diagnosis not present

## 2021-11-28 DIAGNOSIS — R2689 Other abnormalities of gait and mobility: Secondary | ICD-10-CM | POA: Diagnosis not present

## 2021-11-28 DIAGNOSIS — M6281 Muscle weakness (generalized): Secondary | ICD-10-CM | POA: Diagnosis not present

## 2021-11-28 DIAGNOSIS — Z800 Family history of malignant neoplasm of digestive organs: Secondary | ICD-10-CM | POA: Diagnosis not present

## 2021-11-28 DIAGNOSIS — R42 Dizziness and giddiness: Secondary | ICD-10-CM | POA: Diagnosis not present

## 2021-12-13 DIAGNOSIS — M6281 Muscle weakness (generalized): Secondary | ICD-10-CM | POA: Diagnosis not present

## 2021-12-13 DIAGNOSIS — R2689 Other abnormalities of gait and mobility: Secondary | ICD-10-CM | POA: Diagnosis not present

## 2021-12-13 DIAGNOSIS — R42 Dizziness and giddiness: Secondary | ICD-10-CM | POA: Diagnosis not present

## 2021-12-19 DIAGNOSIS — Z79899 Other long term (current) drug therapy: Secondary | ICD-10-CM | POA: Diagnosis not present

## 2021-12-19 DIAGNOSIS — Z941 Heart transplant status: Secondary | ICD-10-CM | POA: Diagnosis not present

## 2021-12-19 DIAGNOSIS — E039 Hypothyroidism, unspecified: Secondary | ICD-10-CM | POA: Diagnosis not present

## 2021-12-19 DIAGNOSIS — Z7960 Long term (current) use of unspecified immunomodulators and immunosuppressants: Secondary | ICD-10-CM | POA: Diagnosis not present

## 2021-12-20 DIAGNOSIS — G4730 Sleep apnea, unspecified: Secondary | ICD-10-CM | POA: Diagnosis not present

## 2021-12-20 DIAGNOSIS — Z79899 Other long term (current) drug therapy: Secondary | ICD-10-CM | POA: Diagnosis not present

## 2021-12-20 DIAGNOSIS — E039 Hypothyroidism, unspecified: Secondary | ICD-10-CM | POA: Diagnosis not present

## 2021-12-20 DIAGNOSIS — Z7901 Long term (current) use of anticoagulants: Secondary | ICD-10-CM | POA: Diagnosis not present

## 2021-12-20 DIAGNOSIS — Z7969 Long term (current) use of other immunomodulators and immunosuppressants: Secondary | ICD-10-CM | POA: Diagnosis not present

## 2021-12-20 DIAGNOSIS — Z4821 Encounter for aftercare following heart transplant: Secondary | ICD-10-CM | POA: Diagnosis not present

## 2021-12-20 DIAGNOSIS — I10 Essential (primary) hypertension: Secondary | ICD-10-CM | POA: Diagnosis not present

## 2021-12-20 DIAGNOSIS — Z941 Heart transplant status: Secondary | ICD-10-CM | POA: Diagnosis not present

## 2021-12-20 DIAGNOSIS — I82592 Chronic embolism and thrombosis of other specified deep vein of left lower extremity: Secondary | ICD-10-CM | POA: Diagnosis not present

## 2021-12-20 DIAGNOSIS — G4733 Obstructive sleep apnea (adult) (pediatric): Secondary | ICD-10-CM | POA: Diagnosis not present

## 2021-12-23 DIAGNOSIS — Z4821 Encounter for aftercare following heart transplant: Secondary | ICD-10-CM | POA: Diagnosis not present

## 2021-12-23 DIAGNOSIS — Z7960 Long term (current) use of unspecified immunomodulators and immunosuppressants: Secondary | ICD-10-CM | POA: Diagnosis not present

## 2022-01-25 DIAGNOSIS — L820 Inflamed seborrheic keratosis: Secondary | ICD-10-CM | POA: Diagnosis not present

## 2022-02-27 DIAGNOSIS — R109 Unspecified abdominal pain: Secondary | ICD-10-CM | POA: Diagnosis not present

## 2022-02-27 DIAGNOSIS — K5730 Diverticulosis of large intestine without perforation or abscess without bleeding: Secondary | ICD-10-CM | POA: Diagnosis not present

## 2022-03-15 ENCOUNTER — Other Ambulatory Visit: Payer: Self-pay | Admitting: Gastroenterology

## 2022-03-15 ENCOUNTER — Ambulatory Visit
Admission: RE | Admit: 2022-03-15 | Discharge: 2022-03-15 | Disposition: A | Discharge status: Home / Self Care | Payer: Medicare Other | Source: Ambulatory Visit | Attending: Gastroenterology | Admitting: Gastroenterology

## 2022-03-15 DIAGNOSIS — K5730 Diverticulosis of large intestine without perforation or abscess without bleeding: Secondary | ICD-10-CM | POA: Diagnosis not present

## 2022-03-15 DIAGNOSIS — G319 Degenerative disease of nervous system, unspecified: Secondary | ICD-10-CM | POA: Diagnosis not present

## 2022-03-15 DIAGNOSIS — K449 Diaphragmatic hernia without obstruction or gangrene: Secondary | ICD-10-CM | POA: Diagnosis not present

## 2022-03-15 DIAGNOSIS — R109 Unspecified abdominal pain: Secondary | ICD-10-CM

## 2022-03-15 DIAGNOSIS — N261 Atrophy of kidney (terminal): Secondary | ICD-10-CM | POA: Diagnosis not present

## 2022-03-15 MED ORDER — IOPAMIDOL (ISOVUE-300) INJECTION 61%
100.0000 mL | Freq: Once | INTRAVENOUS | Status: AC | PRN
  Administered 2022-03-15: 100 mL via INTRAVENOUS

## 2022-06-14 DIAGNOSIS — D6869 Other thrombophilia: Secondary | ICD-10-CM | POA: Diagnosis not present

## 2022-06-14 DIAGNOSIS — D849 Immunodeficiency, unspecified: Secondary | ICD-10-CM | POA: Diagnosis not present

## 2022-06-14 DIAGNOSIS — Z941 Heart transplant status: Secondary | ICD-10-CM | POA: Diagnosis not present

## 2022-06-14 DIAGNOSIS — I10 Essential (primary) hypertension: Secondary | ICD-10-CM | POA: Diagnosis not present

## 2022-06-21 DIAGNOSIS — Z941 Heart transplant status: Secondary | ICD-10-CM | POA: Diagnosis not present

## 2022-06-21 DIAGNOSIS — Z7960 Long term (current) use of unspecified immunomodulators and immunosuppressants: Secondary | ICD-10-CM | POA: Diagnosis not present

## 2022-06-22 DIAGNOSIS — Z941 Heart transplant status: Secondary | ICD-10-CM | POA: Diagnosis not present

## 2022-06-22 DIAGNOSIS — G4733 Obstructive sleep apnea (adult) (pediatric): Secondary | ICD-10-CM | POA: Diagnosis not present

## 2022-06-22 DIAGNOSIS — K529 Noninfective gastroenteritis and colitis, unspecified: Secondary | ICD-10-CM | POA: Diagnosis not present

## 2022-06-22 DIAGNOSIS — Z86718 Personal history of other venous thrombosis and embolism: Secondary | ICD-10-CM | POA: Diagnosis not present

## 2022-06-22 DIAGNOSIS — Z87891 Personal history of nicotine dependence: Secondary | ICD-10-CM | POA: Diagnosis not present

## 2022-06-22 DIAGNOSIS — Z79624 Long term (current) use of inhibitors of nucleotide synthesis: Secondary | ICD-10-CM | POA: Diagnosis not present

## 2022-06-22 DIAGNOSIS — A0472 Enterocolitis due to Clostridium difficile, not specified as recurrent: Secondary | ICD-10-CM | POA: Diagnosis not present

## 2022-06-22 DIAGNOSIS — Z9089 Acquired absence of other organs: Secondary | ICD-10-CM | POA: Diagnosis not present

## 2022-06-22 DIAGNOSIS — Z79890 Hormone replacement therapy (postmenopausal): Secondary | ICD-10-CM | POA: Diagnosis not present

## 2022-06-22 DIAGNOSIS — E039 Hypothyroidism, unspecified: Secondary | ICD-10-CM | POA: Diagnosis not present

## 2022-06-22 DIAGNOSIS — Z7901 Long term (current) use of anticoagulants: Secondary | ICD-10-CM | POA: Diagnosis not present

## 2022-06-22 DIAGNOSIS — Z4821 Encounter for aftercare following heart transplant: Secondary | ICD-10-CM | POA: Diagnosis not present

## 2022-06-22 DIAGNOSIS — Z888 Allergy status to other drugs, medicaments and biological substances status: Secondary | ICD-10-CM | POA: Diagnosis not present

## 2022-06-22 DIAGNOSIS — I10 Essential (primary) hypertension: Secondary | ICD-10-CM | POA: Diagnosis not present

## 2022-06-22 DIAGNOSIS — Z79899 Other long term (current) drug therapy: Secondary | ICD-10-CM | POA: Diagnosis not present

## 2022-06-22 DIAGNOSIS — Z885 Allergy status to narcotic agent status: Secondary | ICD-10-CM | POA: Diagnosis not present

## 2022-07-10 DIAGNOSIS — J3489 Other specified disorders of nose and nasal sinuses: Secondary | ICD-10-CM | POA: Diagnosis not present

## 2022-07-10 DIAGNOSIS — Z7960 Long term (current) use of unspecified immunomodulators and immunosuppressants: Secondary | ICD-10-CM | POA: Diagnosis not present

## 2022-07-10 DIAGNOSIS — R7989 Other specified abnormal findings of blood chemistry: Secondary | ICD-10-CM | POA: Diagnosis not present

## 2022-07-10 DIAGNOSIS — Z941 Heart transplant status: Secondary | ICD-10-CM | POA: Diagnosis not present

## 2022-07-10 DIAGNOSIS — D6869 Other thrombophilia: Secondary | ICD-10-CM | POA: Diagnosis not present

## 2022-07-10 DIAGNOSIS — E039 Hypothyroidism, unspecified: Secondary | ICD-10-CM | POA: Diagnosis not present

## 2022-08-10 DIAGNOSIS — Z4822 Encounter for aftercare following kidney transplant: Secondary | ICD-10-CM | POA: Diagnosis not present

## 2022-08-10 DIAGNOSIS — N261 Atrophy of kidney (terminal): Secondary | ICD-10-CM | POA: Diagnosis not present

## 2022-08-10 DIAGNOSIS — Z9889 Other specified postprocedural states: Secondary | ICD-10-CM | POA: Diagnosis not present

## 2022-08-10 DIAGNOSIS — I350 Nonrheumatic aortic (valve) stenosis: Secondary | ICD-10-CM | POA: Diagnosis not present

## 2022-08-10 DIAGNOSIS — I999 Unspecified disorder of circulatory system: Secondary | ICD-10-CM | POA: Diagnosis not present

## 2022-08-10 DIAGNOSIS — R9431 Abnormal electrocardiogram [ECG] [EKG]: Secondary | ICD-10-CM | POA: Diagnosis not present

## 2022-08-10 DIAGNOSIS — K449 Diaphragmatic hernia without obstruction or gangrene: Secondary | ICD-10-CM | POA: Diagnosis not present

## 2022-08-10 DIAGNOSIS — Z941 Heart transplant status: Secondary | ICD-10-CM | POA: Diagnosis not present

## 2022-08-10 DIAGNOSIS — K2289 Other specified disease of esophagus: Secondary | ICD-10-CM | POA: Diagnosis not present

## 2022-08-10 DIAGNOSIS — R079 Chest pain, unspecified: Secondary | ICD-10-CM | POA: Diagnosis not present

## 2022-08-10 DIAGNOSIS — I501 Left ventricular failure: Secondary | ICD-10-CM | POA: Diagnosis not present

## 2022-08-17 DIAGNOSIS — R7301 Impaired fasting glucose: Secondary | ICD-10-CM | POA: Diagnosis not present

## 2022-08-17 DIAGNOSIS — E785 Hyperlipidemia, unspecified: Secondary | ICD-10-CM | POA: Diagnosis not present

## 2022-09-05 DIAGNOSIS — E785 Hyperlipidemia, unspecified: Secondary | ICD-10-CM | POA: Diagnosis not present

## 2022-09-05 DIAGNOSIS — R7301 Impaired fasting glucose: Secondary | ICD-10-CM | POA: Diagnosis not present

## 2022-09-05 DIAGNOSIS — I10 Essential (primary) hypertension: Secondary | ICD-10-CM | POA: Diagnosis not present

## 2022-09-05 DIAGNOSIS — M810 Age-related osteoporosis without current pathological fracture: Secondary | ICD-10-CM | POA: Diagnosis not present

## 2022-09-12 DIAGNOSIS — R82998 Other abnormal findings in urine: Secondary | ICD-10-CM | POA: Diagnosis not present

## 2022-09-12 DIAGNOSIS — Z1339 Encounter for screening examination for other mental health and behavioral disorders: Secondary | ICD-10-CM | POA: Diagnosis not present

## 2022-09-12 DIAGNOSIS — I10 Essential (primary) hypertension: Secondary | ICD-10-CM | POA: Diagnosis not present

## 2022-09-12 DIAGNOSIS — E611 Iron deficiency: Secondary | ICD-10-CM | POA: Diagnosis not present

## 2022-09-12 DIAGNOSIS — Z23 Encounter for immunization: Secondary | ICD-10-CM | POA: Diagnosis not present

## 2022-09-12 DIAGNOSIS — Z1331 Encounter for screening for depression: Secondary | ICD-10-CM | POA: Diagnosis not present

## 2022-09-12 DIAGNOSIS — Z0000 Encounter for general adult medical examination without abnormal findings: Secondary | ICD-10-CM | POA: Diagnosis not present

## 2022-09-12 DIAGNOSIS — I700 Atherosclerosis of aorta: Secondary | ICD-10-CM | POA: Diagnosis not present

## 2022-10-16 DIAGNOSIS — H52201 Unspecified astigmatism, right eye: Secondary | ICD-10-CM | POA: Diagnosis not present

## 2023-01-02 DIAGNOSIS — Z941 Heart transplant status: Secondary | ICD-10-CM | POA: Diagnosis not present

## 2023-01-02 DIAGNOSIS — Z7960 Long term (current) use of unspecified immunomodulators and immunosuppressants: Secondary | ICD-10-CM | POA: Diagnosis not present

## 2023-02-16 ENCOUNTER — Other Ambulatory Visit: Payer: Self-pay

## 2023-02-16 ENCOUNTER — Encounter (HOSPITAL_BASED_OUTPATIENT_CLINIC_OR_DEPARTMENT_OTHER): Payer: Self-pay | Admitting: Emergency Medicine

## 2023-02-16 ENCOUNTER — Emergency Department (HOSPITAL_BASED_OUTPATIENT_CLINIC_OR_DEPARTMENT_OTHER)
Admission: EM | Admit: 2023-02-16 | Discharge: 2023-02-16 | Disposition: A | Discharge status: Home / Self Care | Payer: Medicare Other | Attending: Emergency Medicine | Admitting: Emergency Medicine

## 2023-02-16 ENCOUNTER — Emergency Department (HOSPITAL_BASED_OUTPATIENT_CLINIC_OR_DEPARTMENT_OTHER): Payer: Medicare Other | Admitting: Radiology

## 2023-02-16 DIAGNOSIS — R197 Diarrhea, unspecified: Secondary | ICD-10-CM | POA: Diagnosis not present

## 2023-02-16 DIAGNOSIS — Z941 Heart transplant status: Secondary | ICD-10-CM | POA: Insufficient documentation

## 2023-02-16 DIAGNOSIS — Z1152 Encounter for screening for COVID-19: Secondary | ICD-10-CM | POA: Insufficient documentation

## 2023-02-16 DIAGNOSIS — R531 Weakness: Secondary | ICD-10-CM

## 2023-02-16 DIAGNOSIS — K449 Diaphragmatic hernia without obstruction or gangrene: Secondary | ICD-10-CM | POA: Diagnosis not present

## 2023-02-16 DIAGNOSIS — R42 Dizziness and giddiness: Secondary | ICD-10-CM | POA: Diagnosis not present

## 2023-02-16 DIAGNOSIS — J069 Acute upper respiratory infection, unspecified: Secondary | ICD-10-CM | POA: Insufficient documentation

## 2023-02-16 DIAGNOSIS — R059 Cough, unspecified: Secondary | ICD-10-CM | POA: Diagnosis not present

## 2023-02-16 DIAGNOSIS — J9811 Atelectasis: Secondary | ICD-10-CM | POA: Diagnosis not present

## 2023-02-16 LAB — COMPREHENSIVE METABOLIC PANEL
ALT: 10 U/L (ref 0–44)
AST: 15 U/L (ref 15–41)
Albumin: 3.6 g/dL (ref 3.5–5.0)
Alkaline Phosphatase: 75 U/L (ref 38–126)
Anion gap: 9 (ref 5–15)
BUN: 12 mg/dL (ref 8–23)
CO2: 23 mmol/L (ref 22–32)
Calcium: 9.1 mg/dL (ref 8.9–10.3)
Chloride: 105 mmol/L (ref 98–111)
Creatinine, Ser: 1.06 mg/dL — ABNORMAL HIGH (ref 0.44–1.00)
GFR, Estimated: 52 mL/min — ABNORMAL LOW (ref >60)
Glucose, Bld: 147 mg/dL — ABNORMAL HIGH (ref 70–99)
Potassium: 3.5 mmol/L (ref 3.5–5.1)
Sodium: 137 mmol/L (ref 135–145)
Total Bilirubin: 0.5 mg/dL (ref 0.3–1.2)
Total Protein: 6.6 g/dL (ref 6.5–8.1)

## 2023-02-16 LAB — RESPIRATORY PANEL BY PCR

## 2023-02-16 LAB — CBG MONITORING, ED: Glucose-Capillary: 106 mg/dL — ABNORMAL HIGH (ref 70–99)

## 2023-02-16 LAB — CBC WITH DIFFERENTIAL/PLATELET
Abs Immature Granulocytes: 0.02 10*3/uL (ref 0.00–0.07)
Basophils Absolute: 0.1 10*3/uL (ref 0.0–0.1)
Basophils Relative: 1 %
Eosinophils Absolute: 0.2 10*3/uL (ref 0.0–0.5)
Eosinophils Relative: 3 %
HCT: 42 % (ref 36.0–46.0)
Hemoglobin: 14.6 g/dL (ref 12.0–15.0)
Immature Granulocytes: 0 %
Lymphocytes Relative: 27 %
Lymphs Abs: 2.6 10*3/uL (ref 0.7–4.0)
MCH: 30.7 pg (ref 26.0–34.0)
MCHC: 34.8 g/dL (ref 30.0–36.0)
MCV: 88.2 fL (ref 80.0–100.0)
Monocytes Absolute: 0.9 10*3/uL (ref 0.1–1.0)
Monocytes Relative: 9 %
Neutro Abs: 6 10*3/uL (ref 1.7–7.7)
Neutrophils Relative %: 60 %
Platelets: 300 10*3/uL (ref 150–400)
RBC: 4.76 MIL/uL (ref 3.87–5.11)
RDW: 13 % (ref 11.5–15.5)
WBC: 9.8 10*3/uL (ref 4.0–10.5)
nRBC: 0 % (ref 0.0–0.2)

## 2023-02-16 LAB — RESP PANEL BY RT-PCR (RSV, FLU A&B, COVID)  RVPGX2
Influenza A by PCR: NEGATIVE
Influenza B by PCR: NEGATIVE
Resp Syncytial Virus by PCR: NEGATIVE
SARS Coronavirus 2 by RT PCR: NEGATIVE

## 2023-02-16 LAB — URINALYSIS, ROUTINE W REFLEX MICROSCOPIC
Bilirubin Urine: NEGATIVE
Glucose, UA: NEGATIVE mg/dL
Hgb urine dipstick: NEGATIVE
Ketones, ur: NEGATIVE mg/dL
Leukocytes,Ua: NEGATIVE
Nitrite: NEGATIVE
Protein, ur: NEGATIVE mg/dL
Specific Gravity, Urine: 1.007 (ref 1.005–1.030)
pH: 6.5 (ref 5.0–8.0)

## 2023-02-16 LAB — TROPONIN I (HIGH SENSITIVITY): Troponin I (High Sensitivity): 11 ng/L (ref <18)

## 2023-02-16 LAB — MAGNESIUM: Magnesium: 1.4 mg/dL — ABNORMAL LOW (ref 1.7–2.4)

## 2023-02-16 MED ORDER — SODIUM CHLORIDE 0.9 % IV BOLUS
500.0000 mL | Freq: Once | INTRAVENOUS | Status: AC
  Administered 2023-02-16: 500 mL via INTRAVENOUS

## 2023-02-16 MED ORDER — MAGNESIUM SULFATE 2 GM/50ML IV SOLN
2.0000 g | Freq: Once | INTRAVENOUS | Status: AC
  Administered 2023-02-16: 2 g via INTRAVENOUS
  Filled 2023-02-16: qty 50

## 2023-02-16 NOTE — ED Notes (Signed)
[facility]; will be contacting Blane Ohara, MD

## 2023-02-16 NOTE — Discharge Instructions (Addendum)
Follow-up with your primary doctor and heart transplant team.  Return for fevers, passing out, chest pain, worsening weakness or new concerns. Stay well hydrated.

## 2023-02-16 NOTE — ED Provider Notes (Signed)
[facility] Provider Note   CSN: [id number] Arrival date & time: 02/16/23  1132     History  Chief Complaint  Patient presents with   Weakness   Dizziness    [name] [name] is a 82 y.o. female.  Patient presents with intermittent dizziness, cough, nonbloody diarrhea, intermittent abdominal lower discomfort and general weakness for almost 1 week.  Patient has history of heart transplant years ago is followed every 6 months by [facility] transplant team.  Patient's been doing very well with this compliant with her medication on immunosuppression.  Patient denies any recent fevers, headache or vomiting.  Decreased oral intake recently.  Patient is compliant with Xarelto and aspirin.  Currently no abdominal pain.  History of diverticulitis.  Patient's had cough without history of pneumonia.  No shortness of breath or chest pain.  No history of blood transfusion, no recent blood in the stools.       Home Medications Prior to Admission medications   Medication Sig Start Date End Date Taking? Authorizing Provider  acetaminophen (TYLENOL) 500 MG tablet Take by mouth.    [provider]  aspirin 81 MG tablet Take 81 mg by mouth daily.    [provider]  calcium carbonate (TUMS - DOSED IN MG ELEMENTAL CALCIUM) 500 MG chewable tablet Chew 1 tablet (200 mg of elemental calcium total) by mouth 3 (three) times daily as needed for indigestion or heartburn. 08/18/21   Albertine Grates, MD  clonazePAM (KLONOPIN) 1 MG tablet Take 1 mg by mouth at bedtime.     [provider]  cyanocobalamin 500 MCG tablet Take 500 mcg by mouth daily.    [provider]  desvenlafaxine (PRISTIQ) 50 MG 24 hr tablet Take 50 mg by mouth daily.    [provider]  diltiazem (CARDIZEM CD) 120 MG 24 hr capsule Take 120 mg by mouth 2 (two) times daily. 07/06/16   [provider]  famotidine (PEPCID) 20 MG tablet Take 20 mg  by mouth daily. 11/07/19   [provider]  fluticasone furoate-vilanterol (BREO ELLIPTA) 100-25 MCG/INH AEPB Inhale 1 puff into the lungs daily.    [provider]  Hyoscyamine Sulfate SL (LEVSIN/SL) 0.125 MG SUBL Place 0.125 mg under the tongue every 4 (four) hours as needed (pain). Up to 1.25 mg daily Patient not taking: Reported on 05/24/2021 12/08/20   Arthor Captain, PA-C  levothyroxine (SYNTHROID, LEVOTHROID) 50 MCG tablet Take 50 mcg by mouth daily before breakfast.    [provider]  loperamide (IMODIUM) 2 MG capsule Take 2 mg by mouth as needed for diarrhea or loose stools.     [provider]  losartan (COZAAR) 100 MG tablet Take 100 mg by mouth 2 (two) times daily. 12/15/19   [provider]  magnesium oxide (MAG-OX) 400 MG tablet Take 400 mg by mouth daily.    [provider]  Multiple Vitamins-Minerals (MULTIVITAMIN PO) Take 1 tablet by mouth daily.    [provider]  mycophenolate (CELLCEPT) 250 MG capsule Take 250 mg by mouth 2 (two) times daily.     [provider]  mycophenolate (CELLCEPT) 250 MG capsule Take 1 capsule by mouth 2 (two) times daily. 05/16/21   [provider]  omeprazole (PRILOSEC) 20 MG capsule Take by mouth.    [provider]  phenazopyridine (PYRIDIUM) 200 MG tablet Take 1 tablet (200 mg total) by mouth 3 (three) times daily. Patient not taking: Reported  on 05/24/2021 01/07/17   Deatra Canter, FNP  Probiotic Product (ALIGN) CHEW Chew by mouth.    [provider]  promethazine (PHENERGAN) 12.5 MG tablet Take 1 tablet (12.5 mg total) by mouth every 6 (six) hours as needed for nausea or vomiting. 08/18/21   Albertine Grates, MD  rivaroxaban (XARELTO) 20 MG TABS tablet Take 20 mg by mouth daily with supper.    [provider]  simethicone (GAS-X) 80 MG chewable tablet Chew 1 tablet (80 mg total) by mouth every 6 (six) hours as needed for flatulence. 12/08/20   Arthor Captain, PA-C  tacrolimus (PROGRAF) 0.5 MG capsule Take 0.5 mg by mouth 2 (two) times daily.    [provider]  Vitamin D, Ergocalciferol, (DRISDOL) 50000 UNITS CAPS capsule Take 50,000 Units by mouth every Friday.     [provider]      Allergies    Antihistamines, chlorpheniramine-type; Augmentin [amoxicillin-pot clavulanate]; Cyclobenzaprine; Metronidazole; Valtrex [valacyclovir]; and Codeine    Review of Systems   Review of Systems  Constitutional:  Positive for fatigue. Negative for chills, fever and unexpected weight change.  HENT:  Negative for congestion.   Eyes:  Negative for visual disturbance.  Respiratory:  Positive for cough. Negative for shortness of breath.   Cardiovascular:  Negative for chest pain.  Gastrointestinal:  Positive for abdominal pain and nausea. Negative for vomiting.  Genitourinary:  Negative for dysuria and flank pain.  Musculoskeletal:  Negative for back pain, neck pain and neck stiffness.  Skin:  Negative for rash.  Neurological:  Negative for light-headedness and headaches.    Physical Exam Updated Vital Signs BP (!) 145/69   Pulse 67   Temp 98 F (36.7 C) (Oral)   Resp (!) 21   Ht 5\' 3"  (1.6 m)   Wt 82.1 kg   SpO2 93%   BMI 32.06 kg/m  Physical Exam Vitals and nursing note reviewed.  Constitutional:      General: She is not in acute distress.    Appearance: She is well-developed.  HENT:     Head: Normocephalic and atraumatic.     Mouth/Throat:     Mouth: Mucous membranes are dry.  Eyes:     General:        Right eye: No discharge.        Left eye: No discharge.     Conjunctiva/sclera: Conjunctivae normal.  Neck:     Trachea: No tracheal deviation.  Cardiovascular:     Rate and Rhythm: Normal rate and regular rhythm.  Pulmonary:     Effort: Pulmonary effort is normal.     Breath sounds: Normal breath sounds.  Abdominal:     General: There is no distension.     Palpations: Abdomen is soft.     Tenderness:  There is no abdominal tenderness. There is no guarding.  Musculoskeletal:     Cervical back: Normal range of motion and neck supple. No rigidity.  Skin:    General: Skin is warm.     Capillary Refill: Capillary refill takes less than 2 seconds.     Findings: No rash.  Neurological:     General: No focal deficit present.     Mental Status: She is alert.     Cranial Nerves: No cranial nerve deficit.  Psychiatric:        Mood and Affect: Mood normal.     ED Results / Procedures / Treatments   Labs (all labs ordered are listed, but only abnormal  results are displayed) Labs Reviewed  COMPREHENSIVE METABOLIC PANEL - Abnormal; Notable for the following components:      Result Value   Glucose, Bld 147 (*)    Creatinine, Ser 1.06 (*)    GFR, Estimated 52 (*)    All other components within normal limits  MAGNESIUM - Abnormal; Notable for the following components:   Magnesium 1.4 (*)    All other components within normal limits  CBG MONITORING, ED - Abnormal; Notable for the following components:   Glucose-Capillary 106 (*)    All other components within normal limits  RESP PANEL BY RT-PCR (RSV, FLU A&B, COVID)  [id number]  RESPIRATORY PANEL BY PCR  URINALYSIS, ROUTINE W REFLEX MICROSCOPIC  CBC WITH DIFFERENTIAL/PLATELET  TROPONIN I (HIGH SENSITIVITY)    EKG EKG Interpretation  Date/Time:  Friday February 16 2023 11:42:11 EDT Ventricular Rate:  78 PR Interval:  164 QRS Duration: 90 QT Interval:  451 QTC Calculation: 514 R Axis:   46 Text Interpretation: Sinus rhythm Probable left atrial enlargement Abnormal T, consider ischemia, lateral leads Prolonged QT interval artifact, similar to previous Confirmed by Blane Ohara [phone]) on 02/16/2023 12:08:21 PM  Radiology DG Chest 2 View  Result Date: 02/16/2023 CLINICAL DATA:  Cough EXAM: CHEST - 2 VIEW COMPARISON:  Chest x-ray dated July 22, 2021 FINDINGS: Cardiac and mediastinal contours are unchanged post median sternotomy.  Moderate hiatal hernia. Elevation of the right hemidiaphragm and bibasilar atelectasis. IMPRESSION: Elevation of the right hemidiaphragm and bibasilar atelectasis. Electronically Signed   By: Allegra Lai M.D.   On: 02/16/2023 12:50    Procedures Procedures    Medications Ordered in ED Medications  sodium chloride 0.9 % bolus 500 mL (0 mLs Intravenous Stopped 02/16/23 1405)  magnesium sulfate IVPB 2 g 50 mL (2 g Intravenous New Bag/Given 02/16/23 1404)    ED Course/ Medical Decision Making/ A&P                             Medical Decision Making Amount and/or Complexity of Data Reviewed Labs: ordered. Radiology: ordered.  Risk Prescription drug management.   Patient presents with primarily feeling generally tired/weak in addition to viral-like symptoms with cough and diarrhea.  Patient's high risk patient due to immunosuppressants and heart transplant history.  No evidence of heart failure or severe infection on exam however discussed with patient immunosuppressants make this more challenging.  Chest x-ray ordered and independently reviewed and compared to 1 in the past overall similar. With diarrhea discussed mild inflammation/viral/toxin mediated, very low concern for diverticulitis given currently no pain.  EKG independently reviewed reassuring similar to previous T wave findings.  Evidence of dehydration on exam dry mucous membranes, generally weak decreased oral intake, IV fluids and oral fluids given.  Blood work reassuring kidney function at baseline 1.06 creatinine, electrolytes unremarkable, magnesium added on as patient is daughter status had low magnesium in the past.  Urinalysis pending, IV fluids and oral fluids given.  Magnesium level low, 2 g IV ordered.  Patient felt improved with IV fluids and magnesium.  Reached out to on-call heart transplant team to communicate reasons she came to the ER, discuss results and follow-up.  Discussed with triage and they will have  on-call physician call me back.  Patient stable for discharge and outpatient follow-up.  Viral panel sent for outpatient follow-up as well. Discussed with heart failure Dr. Sullivan Lone, we reviewed medical history, reviewed blood work results.  She agrees  to follow-up with primary doctor in the next week and they will call her to check on her in the next 2 weeks.  Patient stable for discharge troponin negative results reviewed independently.        Final Clinical Impression(s) / ED Diagnoses Final diagnoses:  Heart transplanted (HCC)  General weakness  Diarrhea, unspecified type  Acute upper respiratory infection  Hypomagnesemia    Rx / DC Orders ED Discharge Orders     None         Blane Ohara, MD 02/16/23 1547

## 2023-02-16 NOTE — ED Triage Notes (Signed)
Pt arrives to ED with c/o dizziness, pelvic pain, diarrhea, cough, and weakness for 6-7 days.

## 2023-02-19 DIAGNOSIS — D849 Immunodeficiency, unspecified: Secondary | ICD-10-CM | POA: Diagnosis not present

## 2023-04-16 DIAGNOSIS — H2511 Age-related nuclear cataract, right eye: Secondary | ICD-10-CM | POA: Diagnosis not present

## 2023-04-16 DIAGNOSIS — H3500 Unspecified background retinopathy: Secondary | ICD-10-CM | POA: Diagnosis not present

## 2023-04-20 DIAGNOSIS — R198 Other specified symptoms and signs involving the digestive system and abdomen: Secondary | ICD-10-CM | POA: Diagnosis not present

## 2023-04-20 DIAGNOSIS — K449 Diaphragmatic hernia without obstruction or gangrene: Secondary | ICD-10-CM | POA: Diagnosis not present

## 2023-05-02 ENCOUNTER — Other Ambulatory Visit (HOSPITAL_BASED_OUTPATIENT_CLINIC_OR_DEPARTMENT_OTHER): Payer: Self-pay

## 2023-05-02 MED ORDER — FLUAD 0.5 ML IM SUSY
0.5000 mL | PREFILLED_SYRINGE | Freq: Once | INTRAMUSCULAR | 0 refills | Status: AC
  Filled 2023-05-02: qty 0.5, 1d supply, fill #0

## 2023-05-31 DIAGNOSIS — H2512 Age-related nuclear cataract, left eye: Secondary | ICD-10-CM | POA: Diagnosis not present

## 2023-07-05 ENCOUNTER — Other Ambulatory Visit: Payer: Self-pay

## 2023-07-05 ENCOUNTER — Inpatient Hospital Stay (HOSPITAL_BASED_OUTPATIENT_CLINIC_OR_DEPARTMENT_OTHER)
Admission: EM | Admit: 2023-07-05 | Discharge: 2023-07-15 | DRG: 388 | Disposition: A | Discharge status: Home Health | Payer: Medicare Other | Attending: Internal Medicine | Admitting: Internal Medicine

## 2023-07-05 ENCOUNTER — Other Ambulatory Visit: Payer: Self-pay | Admitting: Adult Health

## 2023-07-05 ENCOUNTER — Encounter (HOSPITAL_BASED_OUTPATIENT_CLINIC_OR_DEPARTMENT_OTHER): Payer: Self-pay | Admitting: *Deleted

## 2023-07-05 ENCOUNTER — Emergency Department (HOSPITAL_BASED_OUTPATIENT_CLINIC_OR_DEPARTMENT_OTHER): Payer: Medicare Other

## 2023-07-05 DIAGNOSIS — K5730 Diverticulosis of large intestine without perforation or abscess without bleeding: Secondary | ICD-10-CM | POA: Diagnosis not present

## 2023-07-05 DIAGNOSIS — E871 Hypo-osmolality and hyponatremia: Secondary | ICD-10-CM | POA: Diagnosis present

## 2023-07-05 DIAGNOSIS — K219 Gastro-esophageal reflux disease without esophagitis: Secondary | ICD-10-CM | POA: Diagnosis present

## 2023-07-05 DIAGNOSIS — F419 Anxiety disorder, unspecified: Secondary | ICD-10-CM | POA: Diagnosis present

## 2023-07-05 DIAGNOSIS — Z833 Family history of diabetes mellitus: Secondary | ICD-10-CM

## 2023-07-05 DIAGNOSIS — K529 Noninfective gastroenteritis and colitis, unspecified: Secondary | ICD-10-CM | POA: Diagnosis not present

## 2023-07-05 DIAGNOSIS — Z83719 Family history of colon polyps, unspecified: Secondary | ICD-10-CM

## 2023-07-05 DIAGNOSIS — N1831 Chronic kidney disease, stage 3a: Secondary | ICD-10-CM | POA: Diagnosis not present

## 2023-07-05 DIAGNOSIS — Z87891 Personal history of nicotine dependence: Secondary | ICD-10-CM | POA: Diagnosis not present

## 2023-07-05 DIAGNOSIS — Z6832 Body mass index (BMI) 32.0-32.9, adult: Secondary | ICD-10-CM

## 2023-07-05 DIAGNOSIS — E876 Hypokalemia: Secondary | ICD-10-CM | POA: Diagnosis not present

## 2023-07-05 DIAGNOSIS — E785 Hyperlipidemia, unspecified: Secondary | ICD-10-CM | POA: Diagnosis not present

## 2023-07-05 DIAGNOSIS — K56609 Unspecified intestinal obstruction, unspecified as to partial versus complete obstruction: Secondary | ICD-10-CM | POA: Diagnosis present

## 2023-07-05 DIAGNOSIS — E66811 Obesity, class 1: Secondary | ICD-10-CM | POA: Diagnosis not present

## 2023-07-05 DIAGNOSIS — R1084 Generalized abdominal pain: Secondary | ICD-10-CM

## 2023-07-05 DIAGNOSIS — K56699 Other intestinal obstruction unspecified as to partial versus complete obstruction: Secondary | ICD-10-CM | POA: Diagnosis not present

## 2023-07-05 DIAGNOSIS — K5900 Constipation, unspecified: Secondary | ICD-10-CM | POA: Diagnosis not present

## 2023-07-05 DIAGNOSIS — K449 Diaphragmatic hernia without obstruction or gangrene: Secondary | ICD-10-CM | POA: Diagnosis not present

## 2023-07-05 DIAGNOSIS — Z800 Family history of malignant neoplasm of digestive organs: Secondary | ICD-10-CM

## 2023-07-05 DIAGNOSIS — Q438 Other specified congenital malformations of intestine: Secondary | ICD-10-CM | POA: Diagnosis not present

## 2023-07-05 DIAGNOSIS — Z01810 Encounter for preprocedural cardiovascular examination: Secondary | ICD-10-CM | POA: Diagnosis not present

## 2023-07-05 DIAGNOSIS — R140 Abdominal distension (gaseous): Secondary | ICD-10-CM | POA: Diagnosis not present

## 2023-07-05 DIAGNOSIS — R109 Unspecified abdominal pain: Secondary | ICD-10-CM | POA: Diagnosis not present

## 2023-07-05 DIAGNOSIS — D84821 Immunodeficiency due to drugs: Secondary | ICD-10-CM | POA: Diagnosis present

## 2023-07-05 DIAGNOSIS — E039 Hypothyroidism, unspecified: Secondary | ICD-10-CM | POA: Diagnosis not present

## 2023-07-05 DIAGNOSIS — K56691 Other complete intestinal obstruction: Secondary | ICD-10-CM | POA: Diagnosis not present

## 2023-07-05 DIAGNOSIS — Z888 Allergy status to other drugs, medicaments and biological substances status: Secondary | ICD-10-CM

## 2023-07-05 DIAGNOSIS — K5939 Other megacolon: Secondary | ICD-10-CM | POA: Diagnosis not present

## 2023-07-05 DIAGNOSIS — G4733 Obstructive sleep apnea (adult) (pediatric): Secondary | ICD-10-CM | POA: Diagnosis not present

## 2023-07-05 DIAGNOSIS — Z8619 Personal history of other infectious and parasitic diseases: Secondary | ICD-10-CM

## 2023-07-05 DIAGNOSIS — Z86718 Personal history of other venous thrombosis and embolism: Secondary | ICD-10-CM

## 2023-07-05 DIAGNOSIS — Z9049 Acquired absence of other specified parts of digestive tract: Secondary | ICD-10-CM

## 2023-07-05 DIAGNOSIS — F32A Depression, unspecified: Secondary | ICD-10-CM | POA: Diagnosis present

## 2023-07-05 DIAGNOSIS — I4891 Unspecified atrial fibrillation: Secondary | ICD-10-CM | POA: Diagnosis present

## 2023-07-05 DIAGNOSIS — R935 Abnormal findings on diagnostic imaging of other abdominal regions, including retroperitoneum: Secondary | ICD-10-CM

## 2023-07-05 DIAGNOSIS — J984 Other disorders of lung: Secondary | ICD-10-CM | POA: Diagnosis not present

## 2023-07-05 DIAGNOSIS — I10 Essential (primary) hypertension: Secondary | ICD-10-CM

## 2023-07-05 DIAGNOSIS — Z941 Heart transplant status: Secondary | ICD-10-CM

## 2023-07-05 DIAGNOSIS — Z8249 Family history of ischemic heart disease and other diseases of the circulatory system: Secondary | ICD-10-CM

## 2023-07-05 DIAGNOSIS — I110 Hypertensive heart disease with heart failure: Secondary | ICD-10-CM | POA: Diagnosis not present

## 2023-07-05 DIAGNOSIS — Z79890 Hormone replacement therapy (postmenopausal): Secondary | ICD-10-CM | POA: Diagnosis not present

## 2023-07-05 DIAGNOSIS — I129 Hypertensive chronic kidney disease with stage 1 through stage 4 chronic kidney disease, or unspecified chronic kidney disease: Secondary | ICD-10-CM | POA: Diagnosis not present

## 2023-07-05 DIAGNOSIS — R9389 Abnormal findings on diagnostic imaging of other specified body structures: Secondary | ICD-10-CM | POA: Diagnosis not present

## 2023-07-05 DIAGNOSIS — Z79899 Other long term (current) drug therapy: Secondary | ICD-10-CM

## 2023-07-05 DIAGNOSIS — K55039 Acute (reversible) ischemia of large intestine, extent unspecified: Secondary | ICD-10-CM | POA: Diagnosis not present

## 2023-07-05 DIAGNOSIS — K559 Vascular disorder of intestine, unspecified: Secondary | ICD-10-CM | POA: Diagnosis not present

## 2023-07-05 DIAGNOSIS — I700 Atherosclerosis of aorta: Secondary | ICD-10-CM | POA: Diagnosis not present

## 2023-07-05 DIAGNOSIS — K640 First degree hemorrhoids: Secondary | ICD-10-CM | POA: Diagnosis not present

## 2023-07-05 DIAGNOSIS — D75839 Thrombocytosis, unspecified: Secondary | ICD-10-CM | POA: Diagnosis not present

## 2023-07-05 DIAGNOSIS — Z79624 Long term (current) use of inhibitors of nucleotide synthesis: Secondary | ICD-10-CM

## 2023-07-05 DIAGNOSIS — R200 Anesthesia of skin: Secondary | ICD-10-CM | POA: Diagnosis not present

## 2023-07-05 DIAGNOSIS — K6389 Other specified diseases of intestine: Secondary | ICD-10-CM | POA: Diagnosis not present

## 2023-07-05 DIAGNOSIS — K5669 Other partial intestinal obstruction: Secondary | ICD-10-CM | POA: Diagnosis not present

## 2023-07-05 DIAGNOSIS — Z950 Presence of cardiac pacemaker: Secondary | ICD-10-CM

## 2023-07-05 DIAGNOSIS — J90 Pleural effusion, not elsewhere classified: Secondary | ICD-10-CM | POA: Diagnosis not present

## 2023-07-05 DIAGNOSIS — J9811 Atelectasis: Secondary | ICD-10-CM | POA: Diagnosis not present

## 2023-07-05 DIAGNOSIS — R739 Hyperglycemia, unspecified: Secondary | ICD-10-CM | POA: Diagnosis not present

## 2023-07-05 DIAGNOSIS — Z885 Allergy status to narcotic agent status: Secondary | ICD-10-CM

## 2023-07-05 DIAGNOSIS — Z7901 Long term (current) use of anticoagulants: Secondary | ICD-10-CM

## 2023-07-05 DIAGNOSIS — Z7982 Long term (current) use of aspirin: Secondary | ICD-10-CM

## 2023-07-05 DIAGNOSIS — T373X5A Adverse effect of other antiprotozoal drugs, initial encounter: Secondary | ICD-10-CM | POA: Diagnosis not present

## 2023-07-05 DIAGNOSIS — R933 Abnormal findings on diagnostic imaging of other parts of digestive tract: Secondary | ICD-10-CM | POA: Diagnosis not present

## 2023-07-05 LAB — CBC
HCT: 43.5 % (ref 36.0–46.0)
Hemoglobin: 15 g/dL (ref 12.0–15.0)
MCH: 30.8 pg (ref 26.0–34.0)
MCHC: 34.5 g/dL (ref 30.0–36.0)
MCV: 89.3 fL (ref 80.0–100.0)
Platelets: 325 10*3/uL (ref 150–400)
RBC: 4.87 MIL/uL (ref 3.87–5.11)
RDW: 13.2 % (ref 11.5–15.5)
WBC: 18.4 10*3/uL — ABNORMAL HIGH (ref 4.0–10.5)
nRBC: 0 % (ref 0.0–0.2)

## 2023-07-05 LAB — URINALYSIS, ROUTINE W REFLEX MICROSCOPIC
Bilirubin Urine: NEGATIVE
Glucose, UA: NEGATIVE mg/dL
Hgb urine dipstick: NEGATIVE
Ketones, ur: NEGATIVE mg/dL
Leukocytes,Ua: NEGATIVE
Nitrite: NEGATIVE
Specific Gravity, Urine: 1.044 — ABNORMAL HIGH (ref 1.005–1.030)
pH: 6.5 (ref 5.0–8.0)

## 2023-07-05 LAB — COMPREHENSIVE METABOLIC PANEL
ALT: 9 U/L (ref 0–44)
AST: 17 U/L (ref 15–41)
Albumin: 3.9 g/dL (ref 3.5–5.0)
Alkaline Phosphatase: 90 U/L (ref 38–126)
Anion gap: 8 (ref 5–15)
BUN: 17 mg/dL (ref 8–23)
CO2: 25 mmol/L (ref 22–32)
Calcium: 9.9 mg/dL (ref 8.9–10.3)
Chloride: 101 mmol/L (ref 98–111)
Creatinine, Ser: 1.13 mg/dL — ABNORMAL HIGH (ref 0.44–1.00)
GFR, Estimated: 48 mL/min — ABNORMAL LOW (ref >60)
Glucose, Bld: 120 mg/dL — ABNORMAL HIGH (ref 70–99)
Potassium: 4.1 mmol/L (ref 3.5–5.1)
Sodium: 134 mmol/L — ABNORMAL LOW (ref 135–145)
Total Bilirubin: 0.7 mg/dL (ref <1.2)
Total Protein: 7.5 g/dL (ref 6.5–8.1)

## 2023-07-05 LAB — LIPASE, BLOOD: Lipase: <10 U/L — ABNORMAL LOW (ref 11–51)

## 2023-07-05 MED ORDER — ALBUTEROL SULFATE (2.5 MG/3ML) 0.083% IN NEBU: 2.5000 mg | INHALATION_SOLUTION | RESPIRATORY_TRACT | Status: DC | PRN

## 2023-07-05 MED ORDER — ACETAMINOPHEN 325 MG PO TABS
650.0000 mg | ORAL_TABLET | Freq: Four times a day (QID) | ORAL | Status: DC | PRN
  Administered 2023-07-12 – 2023-07-15 (×5): 650 mg via ORAL
  Filled 2023-07-05 (×5): qty 2

## 2023-07-05 MED ORDER — ONDANSETRON HCL 4 MG/2ML IJ SOLN
4.0000 mg | Freq: Once | INTRAMUSCULAR | Status: AC
  Administered 2023-07-05: 4 mg via INTRAVENOUS
  Filled 2023-07-05: qty 2

## 2023-07-05 MED ORDER — CLONAZEPAM 0.5 MG PO TABS: 0.5000 mg | ORAL_TABLET | Freq: Every evening | ORAL | Status: DC | PRN

## 2023-07-05 MED ORDER — ONDANSETRON HCL 4 MG PO TABS
4.0000 mg | ORAL_TABLET | Freq: Four times a day (QID) | ORAL | Status: DC | PRN
  Administered 2023-07-06 – 2023-07-14 (×2): 4 mg via ORAL
  Filled 2023-07-05: qty 1

## 2023-07-05 MED ORDER — ACETAMINOPHEN 650 MG RE SUPP: 650.0000 mg | Freq: Four times a day (QID) | RECTAL | Status: DC | PRN

## 2023-07-05 MED ORDER — TACROLIMUS 0.5 MG PO CAPS
0.5000 mg | ORAL_CAPSULE | Freq: Two times a day (BID) | ORAL | Status: DC
  Administered 2023-07-06 – 2023-07-15 (×18): 0.5 mg via ORAL
  Filled 2023-07-05 (×20): qty 1

## 2023-07-05 MED ORDER — MORPHINE SULFATE (PF) 4 MG/ML IV SOLN
4.0000 mg | Freq: Once | INTRAVENOUS | Status: AC
  Administered 2023-07-05: 4 mg via INTRAVENOUS
  Filled 2023-07-05: qty 1

## 2023-07-05 MED ORDER — TACROLIMUS 0.5 MG PO CAPS
0.5000 mg | ORAL_CAPSULE | Freq: Two times a day (BID) | ORAL | Status: DC
  Filled 2023-07-05: qty 1

## 2023-07-05 MED ORDER — MYCOPHENOLATE MOFETIL 250 MG PO CAPS
250.0000 mg | ORAL_CAPSULE | Freq: Two times a day (BID) | ORAL | Status: DC
  Administered 2023-07-06 – 2023-07-15 (×18): 250 mg via ORAL
  Filled 2023-07-05 (×20): qty 1

## 2023-07-05 MED ORDER — IOHEXOL 300 MG/ML  SOLN
100.0000 mL | Freq: Once | INTRAMUSCULAR | Status: AC | PRN
  Administered 2023-07-05: 80 mL via INTRAVENOUS

## 2023-07-05 MED ORDER — FLUTICASONE FUROATE-VILANTEROL 100-25 MCG/ACT IN AEPB
1.0000 | INHALATION_SPRAY | Freq: Every day | RESPIRATORY_TRACT | Status: DC
  Administered 2023-07-06 – 2023-07-14 (×8): 1 via RESPIRATORY_TRACT
  Filled 2023-07-05: qty 28

## 2023-07-05 MED ORDER — SODIUM CHLORIDE 0.9 % IV BOLUS
1000.0000 mL | Freq: Once | INTRAVENOUS | Status: AC
  Administered 2023-07-05: 1000 mL via INTRAVENOUS

## 2023-07-05 MED ORDER — MYCOPHENOLATE MOFETIL 250 MG PO CAPS
250.0000 mg | ORAL_CAPSULE | Freq: Two times a day (BID) | ORAL | Status: DC
Start: 2023-07-06 — End: 2023-07-05
  Filled 2023-07-05: qty 1

## 2023-07-05 MED ORDER — DILTIAZEM HCL ER COATED BEADS 120 MG PO CP24
120.0000 mg | ORAL_CAPSULE | Freq: Two times a day (BID) | ORAL | Status: DC
  Administered 2023-07-06 – 2023-07-15 (×18): 120 mg via ORAL
  Filled 2023-07-05 (×20): qty 1

## 2023-07-05 MED ORDER — MORPHINE SULFATE (PF) 2 MG/ML IV SOLN
2.0000 mg | INTRAVENOUS | Status: DC | PRN
  Administered 2023-07-06 – 2023-07-07 (×4): 2 mg via INTRAVENOUS
  Filled 2023-07-05 (×4): qty 1

## 2023-07-05 MED ORDER — SODIUM CHLORIDE 0.9 % IV SOLN: INTRAVENOUS | Status: DC

## 2023-07-05 MED ORDER — PANTOPRAZOLE SODIUM 40 MG IV SOLR
40.0000 mg | Freq: Two times a day (BID) | INTRAVENOUS | Status: DC
  Administered 2023-07-06 – 2023-07-14 (×18): 40 mg via INTRAVENOUS
  Filled 2023-07-05 (×19): qty 10

## 2023-07-05 MED ORDER — ONDANSETRON HCL 4 MG/2ML IJ SOLN
4.0000 mg | Freq: Four times a day (QID) | INTRAMUSCULAR | Status: DC | PRN
Start: 2023-07-05 — End: 2023-07-15
  Administered 2023-07-06 – 2023-07-13 (×3): 4 mg via INTRAVENOUS
  Filled 2023-07-05 (×5): qty 2

## 2023-07-05 MED ORDER — LEVOTHYROXINE SODIUM 50 MCG PO TABS
50.0000 ug | ORAL_TABLET | Freq: Every day | ORAL | Status: DC
  Administered 2023-07-06 – 2023-07-15 (×10): 50 ug via ORAL
  Filled 2023-07-05 (×10): qty 1

## 2023-07-05 NOTE — Assessment & Plan Note (Signed)
Continue HOme  meds of Cellcept 250 m gpo BID and Prograff 0.5 mg po Bid

## 2023-07-05 NOTE — ED Notes (Signed)
[facility] notified for consult

## 2023-07-05 NOTE — Assessment & Plan Note (Signed)
Continue dilatiazem 120 mg po bid

## 2023-07-05 NOTE — ED Notes (Signed)
Patient requesting pain medication-PA made aware. 

## 2023-07-05 NOTE — Subjective & Objective (Signed)
Presents with abdominal pain and nausea for the past 3 days Patient went to primary care provider who got x-rays done and was concern for small bowel obstruction Patient has not passed gas or had a BM for past 3 days Try to use a suppository but did not help she try to eat a little piece of toast but that is about it  CT scan worrisome for large bowel obstruction secondary to stool ball with proximal colon measuring up to 7.3 cm underlying colonic stricture suspected General Surgery is aware  Of note patient is a heart transplant recipient followed by [facility]

## 2023-07-05 NOTE — ED Notes (Signed)
[facility] notified for transport 

## 2023-07-05 NOTE — Assessment & Plan Note (Signed)
-   Check TSH continue home medications Synthroid at po q day

## 2023-07-05 NOTE — H&P (Signed)
[name] [name] AOZ:[id number] DOB: Sep 28, 1940 DOA: 07/05/2023     PCP: Cleatis Polka., MD   Outpatient Specialists:     heart transplant clinic Dr. Dot Lanes at [facility]   Patient arrived to ER on 07/05/23 at 1457 Referred by Attending Segars, Christiane Ha, MD   Patient coming from:    home Lives  With family    Chief Complaint:   Chief Complaint  Patient presents with   Abdominal Pain    HPI: [name] [name] is a 82 y.o. female with medical history significant of    History of DVT on Xarelto, hypertension, hypothyroidism, history [facility], OSA not on CPAP parathyroidectomy, history of C. difficile  Presented with abdominal pain distention  Presents with abdominal pain and nausea for the past 3 days Patient went to primary care provider who got x-rays done and was concern for small bowel obstruction Patient has not passed gas or had a BM for past 3 days Try to use a suppository but did not help she try to eat a little piece of toast but that is about it  CT scan worrisome for large bowel obstruction secondary to stool ball with proximal colon measuring up to 7.3 cm underlying colonic stricture suspected General Surgery is aware  Of note patient is a heart transplant recipient followed by [facility]    Denies significant ETOH intake   Does not smoke   Lab Results  Component Value Date   SARSCOV2NAA NEGATIVE 02/16/2023   SARSCOV2NAA NEGATIVE 08/14/2021   SARSCOV2NAA NEGATIVE 07/18/2021       Regarding pertinent Chronic problems:   HTN on losartan diltiazem   History of heart transplant on CellCept Prograf    Hypothyroidism:   Lab Results  Component Value Date   TSH 2.183 07/18/2021   on synthroid   obesity-   BMI Readings from Last 1 Encounters:  07/05/23 32.06 kg/m      Asthma -well  controlled on home inhalers/ nebs                     OSA -noncompliant with CPAP only mild    Hx of DVT 2022  - anticoagulation with  eliquis 5 mg bid   CKD stage IIIa  -   baseline Cr  1.1 Estimated Creatinine Clearance: 37.6 mL/min (A) (by C-G formula based on SCr of 1.13 mg/dL (H)).  Lab Results  Component Value Date   CREATININE 1.13 (H) 07/05/2023   CREATININE 1.06 (H) 02/16/2023   CREATININE 1.08 (H) 08/19/2021   Lab Results  Component Value Date   NA 134 (L) 07/05/2023   CL 101 07/05/2023   K 4.1 07/05/2023   CO2 25 07/05/2023   BUN 17 07/05/2023   CREATININE 1.13 (H) 07/05/2023   GFRNONAA 48 (L) 07/05/2023   CALCIUM 9.9 07/05/2023   ALBUMIN 3.9 07/05/2023   GLUCOSE 120 (H) 07/05/2023   While in ER:       Lab Orders         Lipase, blood         Comprehensive metabolic panel         CBC         Urinalysis, Routine w reflex microscopic -Urine, Clean Catch     CTabd/pelvis - Signs of proximal colonic obstruction with abnormal dilatation of the cecum and proximal colon up to the level of the mid transverse colon. The dilated proximal  colon measures up to 7.3 cm and there is mild wall thickening with surrounding soft tissue stranding. Abrupt transition to decreased caliber mid and distal colon is identified at this level. At the transition point there is a desiccated stool ball measuring 5.6 by 4.8 cm. Underlying colonic stricture either benign or malignant is suspected at this level. Surgical consultation is advised.   Following Medications were ordered in ER: Medications  iohexol (OMNIPAQUE) 300 MG/ML solution 100 mL (80 mLs Intravenous Contrast Given 07/05/23 1703)  morphine (PF) 4 MG/ML injection 4 mg (4 mg Intravenous Given 07/05/23 1740)  ondansetron (ZOFRAN) injection 4 mg (4 mg Intravenous Given 07/05/23 1740)  sodium chloride 0.9 % bolus 1,000 mL (0 mLs Intravenous Stopped 07/05/23 1831)  morphine (PF) 4 MG/ML injection 4 mg (4 mg Intravenous Given 07/05/23 2105)  ondansetron (ZOFRAN) injection 4 mg (4 mg Intravenous Given 07/05/23 2105)     _______________________________________________________ ER Provider Called:     General surgery  Dr. Sheliah Hatch They Recommend admit to medicine   Will see in AM        ED Triage Vitals  Encounter Vitals Group     BP 07/05/23 1507 (!) 156/67     Systolic BP Percentile --      Diastolic BP Percentile --      Pulse Rate 07/05/23 1507 95     Resp 07/05/23 1507 16     Temp 07/05/23 1507 98.2 F (36.8 C)     Temp Source 07/05/23 1507 Oral     SpO2 07/05/23 1507 95 %     Weight 07/05/23 1508 181 lb (82.1 kg)     Height 07/05/23 1508 5\' 3"  (1.6 m)     Head Circumference --      Peak Flow --      Pain Score 07/05/23 1507 9     Pain Loc --      Pain Education --      Exclude from Growth Chart --   VOZD(66)@     _________________________________________ Significant initial  Findings: Abnormal Labs Reviewed  LIPASE, BLOOD - Abnormal; Notable for the following components:      Result Value   Lipase <10 (*)    All other components within normal limits  COMPREHENSIVE METABOLIC PANEL - Abnormal; Notable for the following components:   Sodium 134 (*)    Glucose, Bld 120 (*)    Creatinine, Ser 1.13 (*)    GFR, Estimated 48 (*)    All other components within normal limits  CBC - Abnormal; Notable for the following components:   WBC 18.4 (*)    All other components within normal limits  URINALYSIS, ROUTINE W REFLEX MICROSCOPIC - Abnormal; Notable for the following components:   Specific Gravity, Urine 1.044 (*)    Protein, ur TRACE (*)    All other components within normal limits     ECG: Ordered     The recent clinical data is shown below. Vitals:   07/05/23 2100 07/05/23 2115 07/05/23 2153 07/05/23 2237  BP: (!) 157/68  (!) 164/67 (!) 160/68  Pulse: 82  85 84  Resp:    20  Temp:    98.6 F (37 C)  TempSrc:    Oral  SpO2:  94% 93% 92%  Weight:      Height:      WBC     Component Value Date/Time   WBC 18.4 (H) 07/05/2023 1514   LYMPHSABS 2.6 02/16/2023 1143    MONOABS 0.9 02/16/2023  1143   EOSABS 0.2 02/16/2023 1143   BASOSABS 0.1 02/16/2023 1143       UA  no evidence of UTI   Urine analysis:    Component Value Date/Time   COLORURINE YELLOW 07/05/2023 1514   APPEARANCEUR CLEAR 07/05/2023 1514   LABSPEC 1.044 (H) 07/05/2023 1514   PHURINE 6.5 07/05/2023 1514   GLUCOSEU NEGATIVE 07/05/2023 1514   HGBUR NEGATIVE 07/05/2023 1514   BILIRUBINUR NEGATIVE 07/05/2023 1514   KETONESUR NEGATIVE 07/05/2023 1514   PROTEINUR TRACE (A) 07/05/2023 1514   UROBILINOGEN 0.2 01/07/2017 1655   NITRITE NEGATIVE 07/05/2023 1514   LEUKOCYTESUR NEGATIVE 07/05/2023 1514    Results for orders placed or performed during the hospital encounter of 02/16/23  Respiratory (~20 pathogens) panel by PCR     Status: None   Collection Time: 02/16/23  1:07 PM   Specimen: Nasopharyngeal Swab; Respiratory  Result Value Ref Range Status   Adenovirus NOT DETECTED NOT DETECTED Final   Coronavirus 229E NOT DETECTED NOT DETECTED Final    Comment: (NOTE) The Coronavirus on the Respiratory Panel, DOES NOT test for the novel  Coronavirus (2019 nCoV)    Coronavirus HKU1 NOT DETECTED NOT DETECTED Final   Coronavirus NL63 NOT DETECTED NOT DETECTED Final   Coronavirus OC43 NOT DETECTED NOT DETECTED Final   Metapneumovirus NOT DETECTED NOT DETECTED Final   Rhinovirus / Enterovirus NOT DETECTED NOT DETECTED Final   Influenza A NOT DETECTED NOT DETECTED Final   Influenza B NOT DETECTED NOT DETECTED Final   Parainfluenza Virus 1 NOT DETECTED NOT DETECTED Final   Parainfluenza Virus 2 NOT DETECTED NOT DETECTED Final   Parainfluenza Virus 3 NOT DETECTED NOT DETECTED Final   Parainfluenza Virus 4 NOT DETECTED NOT DETECTED Final   Respiratory Syncytial Virus NOT DETECTED NOT DETECTED Final   Bordetella pertussis NOT DETECTED NOT DETECTED Final   Bordetella Parapertussis NOT DETECTED NOT DETECTED Final   Chlamydophila pneumoniae NOT DETECTED NOT DETECTED Final   Mycoplasma pneumoniae  NOT DETECTED NOT DETECTED Final    Comment: Performed at [facility], [address], [place], [place] [address]  Resp panel by RT-PCR (RSV, Flu A&B, Covid) Urine, Clean Catch     Status: None    __________________________________________________________ Recent Labs  Lab 07/05/23 1514  NA 134*  K 4.1  CO2 25  GLUCOSE 120*  BUN 17  CREATININE 1.13*  CALCIUM 9.9    Cr  stable,   Lab Results  Component Value Date   CREATININE 1.13 (H) 07/05/2023   CREATININE 1.06 (H) 02/16/2023   CREATININE 1.08 (H) 08/19/2021    Recent Labs  Lab 07/05/23 1514  AST 17  ALT 9  ALKPHOS 90  BILITOT 0.7  PROT 7.5  ALBUMIN 3.9   Lab Results  Component Value Date   CALCIUM 9.9 07/05/2023          Plt: Lab Results  Component Value Date   PLT 325 07/05/2023         Recent Labs  Lab 07/05/23 1514  WBC 18.4*  HGB 15.0  HCT 43.5  MCV 89.3  PLT 325    HG/HCT * stable,  Down *Up from baseline see below    Component Value Date/Time   HGB 15.0 07/05/2023 1514   HCT 43.5 07/05/2023 1514   MCV 89.3 07/05/2023 1514      Recent Labs  Lab 07/05/23 1514  LIPASE <10*   No results for input(s): "AMMONIA" in the last 168 hours.    [facility]  lab  _______________________________________________ Hospitalist was called for admission for   intestinal obstruction [facility])  The following Work up has been ordered so far:  Orders Placed This Encounter  Procedures   Critical Care   CT ABDOMEN PELVIS W CONTRAST   Lipase, blood   Comprehensive metabolic panel   CBC   Urinalysis, Routine w reflex microscopic -Urine, Clean Catch   Diet NPO time specified   Cardiac Monitoring Continuous x 48 hours Indications for use: Other; Other indications for use: bowel obstruction   Consult to general surgery  [phone] Bowel obstruction   Consult to hospitalist  [phone] Bowel obstruction - general surgery following   Admit to Inpatient (patient's expected length of stay will be greater  than 2 midnights or inpatient only procedure)     OTHER Significant initial  Findings:  labs showing:     DM  labs:  HbA1C: No results for input(s): "HGBA1C" in the last 8760 hours.     CBG (last 3)  No results for input(s): "GLUCAP" in the last 72 hours.        Cultures:    Component Value Date/Time   SDES URINE, CLEAN CATCH 01/07/2017 1704   SPECREQUEST NONE 01/07/2017 1704   CULT MULTIPLE SPECIES PRESENT, SUGGEST RECOLLECTION (A) 01/07/2017 1704   REPTSTATUS 01/08/2017 FINAL 01/07/2017 1704     Radiological Exams on Admission: CT ABDOMEN PELVIS W CONTRAST  Result Date: 07/05/2023 CLINICAL DATA:  Nonlocalized acute abdominal pain. EXAM: CT ABDOMEN AND PELVIS WITH CONTRAST TECHNIQUE: Multidetector CT imaging of the abdomen and pelvis was performed using the standard protocol following bolus administration of intravenous contrast. RADIATION DOSE REDUCTION: This exam was performed according to the departmental dose-optimization program which includes automated exposure control, adjustment of the mA and/or kV according to patient size and/or use of iterative reconstruction technique. CONTRAST:  80mL OMNIPAQUE IOHEXOL 300 MG/ML  SOLN COMPARISON:  03/15/2022. FINDINGS: Lower chest: No acute abnormality. Hepatobiliary: No focal liver abnormality is seen. No gallstones, gallbladder wall thickening, or biliary dilatation. Pancreas: Unremarkable. No pancreatic ductal dilatation or surrounding inflammatory changes. Spleen: Normal in size without focal abnormality. Adrenals/Urinary Tract: Normal appearance of the adrenal glands. No kidney mass, nephrolithiasis or signs of obstructive uropathy. Stomach/Bowel: Moderate hiatal hernia. Stomach otherwise unremarkable. There is no small bowel wall thickening, inflammation or distension. Atypical location of the cecum within the right upper quadrant of the abdomen. There is abnormal dilatation of the cecum and proximal colon up to the level of the mid  transverse colon. The dilated colon measures up to 7.3 cm and there is mild wall thickening with surrounding soft tissue stranding, image 53/2. No pneumatosis. Abrupt transition to decreased caliber mid and distal transverse colon is identified at this level. At the transition point there is a desiccated stool ball measuring 5.6 by 4.8 cm. Underlying colonic stricture either benign or malignant is suspected at this level. Distal colonic diverticulosis noted without signs of acute diverticulitis. Vascular/Lymphatic: Aortic atherosclerosis. No aneurysm. No signs of abdominopelvic adenopathy. Reproductive: Status post hysterectomy. No adnexal masses. Other: No free fluid or fluid collections. No signs of pneumoperitoneum. Musculoskeletal: No acute or significant osseous findings. IMPRESSION: 1. Signs of proximal colonic obstruction with abnormal dilatation of the cecum and proximal colon up to the level of the mid transverse colon. The dilated proximal colon measures up to 7.3 cm and there is mild wall thickening with surrounding soft tissue stranding. Abrupt transition to decreased caliber mid and distal colon is identified at this level. At the  transition point there is a desiccated stool ball measuring 5.6 by 4.8 cm. Underlying colonic stricture either benign or malignant is suspected at this level. Surgical consultation is advised. 2. Moderate hiatal hernia. 3. Distal colonic diverticulosis without signs of acute diverticulitis. 4.  Aortic Atherosclerosis (ICD10-I70.0). Electronically Signed   By: Signa Kell M.D.   On: 07/05/2023 19:14   _______________________________________________________________________________________________________ Latest  Blood pressure (!) 160/68, pulse 84, temperature 98.6 F (37 C), temperature source Oral, resp. rate 20, height 5\' 3"  (1.6 m), weight 82.1 kg, SpO2 92%.   Vitals  labs and radiology finding personally reviewed  Review of Systems:    Pertinent positives  include:   abdominal pain, nausea,  Constitutional:  No weight loss, night sweats, Fevers, chills, fatigue, weight loss  HEENT:  No headaches, Difficulty swallowing,Tooth/dental problems,Sore throat,  No sneezing, itching, ear ache, nasal congestion, post nasal drip,  Cardio-vascular:  No chest pain, Orthopnea, PND, anasarca, dizziness, palpitations.no Bilateral lower extremity swelling  GI:  No heartburn, indigestion, vomiting, diarrhea, change in bowel habits, loss of appetite, melena, blood in stool, hematemesis Resp:  no shortness of breath at rest. No dyspnea on exertion, No excess mucus, no productive cough, No non-productive cough, No coughing up of blood.No change in color of mucus.No wheezing. Skin:  no rash or lesions. No jaundice GU:  no dysuria, change in color of urine, no urgency or frequency. No straining to urinate.  No flank pain.  Musculoskeletal:  No joint pain or no joint swelling. No decreased range of motion. No back pain.  Psych:  No change in mood or affect. No depression or anxiety. No memory loss.  Neuro: no localizing neurological complaints, no tingling, no weakness, no double vision, no gait abnormality, no slurred speech, no confusion  All systems reviewed and apart from HOPI all are negative _______________________________________________________________________________________________ Past Medical History:   Past Medical History:  Diagnosis Date   A-fib (HCC)    resolved   Anxiety    Arrhythmia    bradycardia   CHF (congestive heart failure) (HCC)    resolved   Depression    DVT (deep venous thrombosis) (HCC)    GERD (gastroesophageal reflux disease)    Heart transplanted (HCC)    Followed at [facility] - Dr. Laurence Compton   HTN (hypertension)    Hyperparathyroidism [facility])    resolved   Osteoporosis    Pacemaker    resolved      Past Surgical History:  Procedure Laterality Date   ABDOMINAL HYSTERECTOMY     ABLATION  2007   ABLATION ON  ENDOMETRIOSIS     APPENDECTOMY     BREAST EXCISIONAL BIOPSY Left 1986   benign   CARDIAC CATHETERIZATION  2009   HEART TRANSPLANT  2009   HEART TRANSPLANT     NECK SURGERY     removal of parathyroid   PACEMAKER INSERTION  2005   TOTAL ABDOMINAL HYSTERECTOMY W/ BILATERAL SALPINGOOPHORECTOMY      Social History:  Ambulatory  independently     reports that she has quit smoking. She has never used smokeless tobacco. She reports that she does not drink alcohol and does not use drugs.     Family History:  Family History  Problem Relation Age of Onset   Heart attack Mother    Heart disease Mother    Colon cancer Father    Ulcers Father    Liver cancer Father    Colon polyps Sister    Heart attack Maternal Grandmother  Heart attack Paternal Grandmother    Diabetes type II Paternal Grandmother    OCD Daughter    ______________________________________________________________________________________________ Allergies: Allergies  Allergen Reactions   Antihistamines, Chlorpheniramine-Type Other (See Comments)    Numbness   Augmentin [Amoxicillin-Pot Clavulanate]     Nausea only   Cyclobenzaprine Other (See Comments)   Fentanyl Other (See Comments)    Other reaction(s): Confusion (intolerance)   Metronidazole     Unknown reaction    Valtrex [Valacyclovir]     "Made my chin numb"   Codeine Nausea Only     Prior to Admission medications   Medication Sig Start Date End Date Taking? Authorizing Provider  apixaban (ELIQUIS) 5 MG TABS tablet Take 5 mg by mouth 2 (two) times daily.   Yes [provider]  acetaminophen (TYLENOL) 500 MG tablet Take by mouth.    [provider]  aspirin 81 MG tablet Take 81 mg by mouth daily.    [provider]  calcium carbonate (TUMS - DOSED IN MG ELEMENTAL CALCIUM) 500 MG chewable tablet Chew 1 tablet (200 mg of elemental calcium total) by mouth 3 (three) times daily as needed for indigestion or heartburn. 08/18/21    Albertine Grates, MD  clonazePAM (KLONOPIN) 1 MG tablet Take 1 mg by mouth at bedtime.     [provider]  cyanocobalamin 500 MCG tablet Take 500 mcg by mouth daily.    [provider]  desvenlafaxine (PRISTIQ) 50 MG 24 hr tablet Take 50 mg by mouth daily.    [provider]  diltiazem (CARDIZEM CD) 120 MG 24 hr capsule Take 120 mg by mouth 2 (two) times daily. 07/06/16   [provider]  famotidine (PEPCID) 20 MG tablet Take 20 mg by mouth daily. 11/07/19   [provider]  fluticasone furoate-vilanterol (BREO ELLIPTA) 100-25 MCG/INH AEPB Inhale 1 puff into the lungs daily.    [provider]  Hyoscyamine Sulfate SL (LEVSIN/SL) 0.125 MG SUBL Place 0.125 mg under the tongue every 4 (four) hours as needed (pain). Up to 1.25 mg daily Patient not taking: Reported on 05/24/2021 12/08/20   Arthor Captain, PA-C  levothyroxine (SYNTHROID, LEVOTHROID) 50 MCG tablet Take 50 mcg by mouth daily before breakfast.    [provider]  loperamide (IMODIUM) 2 MG capsule Take 2 mg by mouth as needed for diarrhea or loose stools.     [provider]  losartan (COZAAR) 100 MG tablet Take 100 mg by mouth 2 (two) times daily. 12/15/19   [provider]  magnesium oxide (MAG-OX) 400 MG tablet Take 400 mg by mouth daily.    [provider]  Multiple Vitamins-Minerals (MULTIVITAMIN PO) Take 1 tablet by mouth daily.    [provider]  mycophenolate (CELLCEPT) 250 MG capsule Take 250 mg by mouth 2 (two) times daily.     [provider]  mycophenolate (CELLCEPT) 250 MG capsule Take 1 capsule by mouth 2 (two) times daily. 05/16/21   [provider]  omeprazole (PRILOSEC) 20 MG capsule Take by mouth.    [provider]  phenazopyridine (PYRIDIUM) 200 MG tablet Take 1 tablet (200 mg total) by mouth 3 (three) times daily. Patient not taking: Reported on 05/24/2021 01/07/17   Deatra Canter, FNP  Probiotic  Product (ALIGN) CHEW Chew by mouth.    [provider]  promethazine (PHENERGAN) 12.5 MG tablet Take 1 tablet (12.5 mg total) by mouth every 6 (six) hours as needed for nausea or vomiting. 08/18/21  Albertine Grates, MD  simethicone (GAS-X) 80 MG chewable tablet Chew 1 tablet (80 mg total) by mouth every 6 (six) hours as needed for flatulence. 12/08/20   Arthor Captain, PA-C  tacrolimus (PROGRAF) 0.5 MG capsule Take 0.5 mg by mouth 2 (two) times daily.    [provider]  Vitamin D, Ergocalciferol, (DRISDOL) 50000 UNITS CAPS capsule Take 50,000 Units by mouth every Friday.     [provider]    ___________________________________________________________________________________________________ Physical Exam:    07/05/2023   10:37 PM 07/05/2023    9:53 PM 07/05/2023    9:00 PM  Vitals with BMI  Systolic 160 164 161  Diastolic 68 67 68  Pulse 84 85 82     1. General:  in No  Acute distress    Chronically ill   -appearing 2. Psychological: Alert and   Oriented 3. Head/ENT:    Dry Mucous Membranes                          Head Non traumatic, neck supple                           Poor Dentition 4. SKIN: decreased Skin turgor,  Skin clean Dry and intact no rash    5. Heart: Regular rate and rhythm no  Murmur, no Rub or gallop 6. Lungs: no wheezes or crackles   7. Abdomen: Soft, rlq tender, Non distended   obese  bowel sounds diminished 8. Lower extremities: no clubbing, cyanosis, no  edema 9. Neurologically Grossly intact, moving all 4 extremities equally   10. MSK: Normal range of motion    Chart has been reviewed  ______________________________________________________________________________________________  Assessment/Plan 82 y.o. female with medical history significant of    History of DVT on Xarelto, hypertension, hypothyroidism, [facility], OSA not on CPAP parathyroidectomy, history of C. difficile   Admitted  for    complete large intestinal obstruction (HCC)    Present on Admission:  Large bowel obstruction (HCC)  HTN (hypertension)  Hypothyroidism     Large bowel obstruction (HCC) Npo except for sips with meds  General Surgery is aware  No significant N/V so will hold off on NG tube for now  Heart transplanted (HCC) Continue HOme  meds of Cellcept 250 m gpo BID and Prograff 0.5 mg po Bid  HTN (hypertension) Continue dilatiazem 120 mg po bid  History of DVT (deep vein thrombosis) Hold eliquis as pt may need surgical intervention  Hypothyroidism - Check TSH continue home medications Synthroid at po q day   Other plan as per orders.  DVT prophylaxis:  SCD      Code Status:    Code Status: Prior FULL CODE as per patient  I had personally discussed CODE STATUS with patient and family  ACP  none  Family Communication:   Family not at  Bedside  plan of care was discussed on the phone with  Daughter,   Diet  Diet Orders (From admission, onward)     Start     Ordered   07/05/23 1510  Diet NPO time specified  Diet effective now        07/05/23 1509            Disposition Plan:     To home once workup is complete and patient is stable   Following barriers for discharge:  Electrolytes corrected                                                            Pain controlled with PO medications                                                         Will need consultants to evaluate patient prior to discharge       Consult Orders  (From admission, onward)           Start     Ordered   07/05/23 1947  Consult to hospitalist  [phone] Bowel obstruction - general surgery following  Once       Comments: [phone] Bowel obstruction - general surgery following  Provider:  (Not yet assigned)  Question Answer Comment  Place call to: [facility]   Reason for Consult Admit      07/05/23 1947              Consults called: general surgery     Admission status:  ED Disposition     ED Disposition  Admit   Condition  --   Comment  Hospital Area: [facility] [[id number]]  Level of Care: Telemetry Medical [[id number]]  May admit patient to [facility] or [facility] if equivalent level of care is available:: No  Interfacility transfer: Yes  Covid Evaluation: Asymptomatic - no recent exposure (last 10 days) testing not required  Diagnosis: Large bowel obstruction [facility]) [[id number]]  Admitting Physician: Dolly Rias [[id number]]  Attending Physician: Dolly Rias [[id number]]  Certification:: I certify this patient will need inpatient services for at least 2 midnights  Expected Medical Readiness: 07/09/2023          inpatient     I Expect 2 midnight stay secondary to severity of patient's current illness    Severe lab/radiological/exam abnormalities including:    Colonic obstruction and extensive comorbidities including:  Heart transplant recipient Chronic anticoagulation  That are currently affecting medical management.   I expect  patient to be hospitalized for 2 midnights requiring inpatient medical care.  Patient is at high risk for adverse outcome (such as loss of life or disability) if not treated.  Indication for inpatient stay as follows:    inability to maintain oral hydration    Need for operative/procedural  intervention     Need fo  IV fluids,  , IV pain medications,     Level of care     tele  For 24H        [name] [name] 07/06/2023, 1:01 AM    [facility]     after 2 AM please page floor coverage PA If 7AM-7PM, please contact the day team taking care of the patient using [email or web address]

## 2023-07-05 NOTE — ED Provider Notes (Signed)
[place] [facility] AT [facility] Provider Note   CSN: [id number] Arrival date & time: 07/05/23  1457     History  Chief Complaint  Patient presents with   Abdominal Pain    [name] [name] is a 82 y.o. female with PMHx afib, anxiety, GERD, HTN, heart transplant on immunosuppressants, hyperparathyroidism who presents to ED concerned for abdominal pain and nausea x3 days. Patient went to PCP today who obtained an abdominal xray that was concerning for SBO. Patient has not been able to pass gas or have a BM x3 days. Has tried a suppository without relief. Patient stating that she has only been able to tolerate one piece of toast over the past 3 days which was today around noon.   Abdominal Pain      Home Medications Prior to Admission medications   Medication Sig Start Date End Date Taking? Authorizing Provider  acetaminophen (TYLENOL) 500 MG tablet Take by mouth.    [provider]  aspirin 81 MG tablet Take 81 mg by mouth daily.    [provider]  calcium carbonate (TUMS - DOSED IN MG ELEMENTAL CALCIUM) 500 MG chewable tablet Chew 1 tablet (200 mg of elemental calcium total) by mouth 3 (three) times daily as needed for indigestion or heartburn. 08/18/21   Albertine Grates, MD  clonazePAM (KLONOPIN) 1 MG tablet Take 1 mg by mouth at bedtime.     [provider]  cyanocobalamin 500 MCG tablet Take 500 mcg by mouth daily.    [provider]  desvenlafaxine (PRISTIQ) 50 MG 24 hr tablet Take 50 mg by mouth daily.    [provider]  diltiazem (CARDIZEM CD) 120 MG 24 hr capsule Take 120 mg by mouth 2 (two) times daily. 07/06/16   [provider]  famotidine (PEPCID) 20 MG tablet Take 20 mg by mouth daily. 11/07/19   [provider]  fluticasone furoate-vilanterol (BREO ELLIPTA) 100-25 MCG/INH AEPB Inhale 1 puff into the lungs daily.    [provider]  Hyoscyamine Sulfate SL (LEVSIN/SL) 0.125 MG SUBL  Place 0.125 mg under the tongue every 4 (four) hours as needed (pain). Up to 1.25 mg daily Patient not taking: Reported on 05/24/2021 12/08/20   Arthor Captain, PA-C  levothyroxine (SYNTHROID, LEVOTHROID) 50 MCG tablet Take 50 mcg by mouth daily before breakfast.    [provider]  loperamide (IMODIUM) 2 MG capsule Take 2 mg by mouth as needed for diarrhea or loose stools.     [provider]  losartan (COZAAR) 100 MG tablet Take 100 mg by mouth 2 (two) times daily. 12/15/19   [provider]  magnesium oxide (MAG-OX) 400 MG tablet Take 400 mg by mouth daily.    [provider]  Multiple Vitamins-Minerals (MULTIVITAMIN PO) Take 1 tablet by mouth daily.    [provider]  mycophenolate (CELLCEPT) 250 MG capsule Take 250 mg by mouth 2 (two) times daily.     [provider]  mycophenolate (CELLCEPT) 250 MG capsule Take 1 capsule by mouth 2 (two) times daily. 05/16/21   [provider]  omeprazole (PRILOSEC) 20 MG capsule Take by mouth.    [provider]  phenazopyridine (PYRIDIUM) 200 MG tablet Take 1 tablet (200 mg total) by mouth 3 (three) times daily. Patient not taking: Reported on 05/24/2021 01/07/17   Deatra Canter, FNP  Probiotic Product (ALIGN) CHEW Chew by mouth.    [provider]  promethazine (PHENERGAN) 12.5 MG tablet Take  1 tablet (12.5 mg total) by mouth every 6 (six) hours as needed for nausea or vomiting. 08/18/21   Albertine Grates, MD  rivaroxaban (XARELTO) 20 MG TABS tablet Take 20 mg by mouth daily with supper.    [provider]  simethicone (GAS-X) 80 MG chewable tablet Chew 1 tablet (80 mg total) by mouth every 6 (six) hours as needed for flatulence. 12/08/20   Arthor Captain, PA-C  tacrolimus (PROGRAF) 0.5 MG capsule Take 0.5 mg by mouth 2 (two) times daily.    [provider]  Vitamin D, Ergocalciferol, (DRISDOL) 50000 UNITS CAPS capsule Take 50,000 Units by mouth every Friday.      [provider]      Allergies    Antihistamines, chlorpheniramine-type; Augmentin [amoxicillin-pot clavulanate]; Cyclobenzaprine; Fentanyl; Metronidazole; Valtrex [valacyclovir]; and Codeine    Review of Systems   Review of Systems  Gastrointestinal:  Positive for abdominal pain.    Physical Exam Updated Vital Signs BP (!) 140/63   Pulse 85   Temp 99.1 F (37.3 C) (Oral)   Resp 16   Ht 5\' 3"  (1.6 m)   Wt 82.1 kg   SpO2 94%   BMI 32.06 kg/m  Physical Exam Vitals and nursing note reviewed.  Constitutional:      General: She is not in acute distress.    Appearance: She is not ill-appearing or toxic-appearing.  HENT:     Head: Normocephalic and atraumatic.     Mouth/Throat:     Mouth: Mucous membranes are moist.     Pharynx: No posterior oropharyngeal erythema.  Eyes:     General: No scleral icterus.       Right eye: No discharge.        Left eye: No discharge.     Conjunctiva/sclera: Conjunctivae normal.  Cardiovascular:     Rate and Rhythm: Normal rate and regular rhythm.     Pulses: Normal pulses.     Heart sounds: Normal heart sounds. No murmur heard. Pulmonary:     Effort: Pulmonary effort is normal. No respiratory distress.     Breath sounds: Normal breath sounds. No wheezing, rhonchi or rales.  Abdominal:     General: Abdomen is flat. Bowel sounds are decreased.     Palpations: Abdomen is soft. There is no mass.     Tenderness: There is generalized abdominal tenderness.     Comments: Mild distension  Musculoskeletal:     Right lower leg: No edema.     Left lower leg: No edema.  Skin:    General: Skin is warm and dry.     Findings: No rash.  Neurological:     General: No focal deficit present.     Mental Status: She is alert. Mental status is at baseline.  Psychiatric:        Mood and Affect: Mood normal.        Behavior: Behavior normal.     ED Results / Procedures / Treatments   Labs (all labs ordered are listed, but only abnormal  results are displayed) Labs Reviewed  LIPASE, BLOOD - Abnormal; Notable for the following components:      Result Value   Lipase <10 (*)    All other components within normal limits  COMPREHENSIVE METABOLIC PANEL - Abnormal; Notable for the following components:   Sodium 134 (*)    Glucose, Bld 120 (*)    Creatinine, Ser 1.13 (*)    GFR, Estimated 48 (*)    All other components within  normal limits  CBC - Abnormal; Notable for the following components:   WBC 18.4 (*)    All other components within normal limits  URINALYSIS, ROUTINE W REFLEX MICROSCOPIC - Abnormal; Notable for the following components:   Specific Gravity, Urine 1.044 (*)    Protein, ur TRACE (*)    All other components within normal limits    EKG None  Radiology CT ABDOMEN PELVIS W CONTRAST  Result Date: 07/05/2023 CLINICAL DATA:  Nonlocalized acute abdominal pain. EXAM: CT ABDOMEN AND PELVIS WITH CONTRAST TECHNIQUE: Multidetector CT imaging of the abdomen and pelvis was performed using the standard protocol following bolus administration of intravenous contrast. RADIATION DOSE REDUCTION: This exam was performed according to the departmental dose-optimization program which includes automated exposure control, adjustment of the mA and/or kV according to patient size and/or use of iterative reconstruction technique. CONTRAST:  80mL OMNIPAQUE IOHEXOL 300 MG/ML  SOLN COMPARISON:  03/15/2022. FINDINGS: Lower chest: No acute abnormality. Hepatobiliary: No focal liver abnormality is seen. No gallstones, gallbladder wall thickening, or biliary dilatation. Pancreas: Unremarkable. No pancreatic ductal dilatation or surrounding inflammatory changes. Spleen: Normal in size without focal abnormality. Adrenals/Urinary Tract: Normal appearance of the adrenal glands. No kidney mass, nephrolithiasis or signs of obstructive uropathy. Stomach/Bowel: Moderate hiatal hernia. Stomach otherwise unremarkable. There is no small bowel wall  thickening, inflammation or distension. Atypical location of the cecum within the right upper quadrant of the abdomen. There is abnormal dilatation of the cecum and proximal colon up to the level of the mid transverse colon. The dilated colon measures up to 7.3 cm and there is mild wall thickening with surrounding soft tissue stranding, image 53/2. No pneumatosis. Abrupt transition to decreased caliber mid and distal transverse colon is identified at this level. At the transition point there is a desiccated stool ball measuring 5.6 by 4.8 cm. Underlying colonic stricture either benign or malignant is suspected at this level. Distal colonic diverticulosis noted without signs of acute diverticulitis. Vascular/Lymphatic: Aortic atherosclerosis. No aneurysm. No signs of abdominopelvic adenopathy. Reproductive: Status post hysterectomy. No adnexal masses. Other: No free fluid or fluid collections. No signs of pneumoperitoneum. Musculoskeletal: No acute or significant osseous findings. IMPRESSION: 1. Signs of proximal colonic obstruction with abnormal dilatation of the cecum and proximal colon up to the level of the mid transverse colon. The dilated proximal colon measures up to 7.3 cm and there is mild wall thickening with surrounding soft tissue stranding. Abrupt transition to decreased caliber mid and distal colon is identified at this level. At the transition point there is a desiccated stool ball measuring 5.6 by 4.8 cm. Underlying colonic stricture either benign or malignant is suspected at this level. Surgical consultation is advised. 2. Moderate hiatal hernia. 3. Distal colonic diverticulosis without signs of acute diverticulitis. 4.  Aortic Atherosclerosis (ICD10-I70.0). Electronically Signed   By: Signa Kell M.D.   On: 07/05/2023 19:14    Procedures .Critical Care  Performed by: Dorthy Cooler, PA-C Authorized by: Dorthy Cooler, PA-C   Critical care provider statement:    Critical care  time (minutes):  30   Critical care was necessary to treat or prevent imminent or life-threatening deterioration of the following conditions: small bowel obstruction.   Critical care was time spent personally by me on the following activities:  Development of treatment plan with patient or surrogate, discussions with consultants, evaluation of patient's response to treatment, examination of patient, ordering and review of laboratory studies, ordering and review of radiographic studies, ordering and performing  treatments and interventions, pulse oximetry, re-evaluation of patient's condition and review of old charts   Care discussed with: admitting provider       Medications Ordered in ED Medications  iohexol (OMNIPAQUE) 300 MG/ML solution 100 mL (80 mLs Intravenous Contrast Given 07/05/23 1703)  morphine (PF) 4 MG/ML injection 4 mg (4 mg Intravenous Given 07/05/23 1740)  ondansetron (ZOFRAN) injection 4 mg (4 mg Intravenous Given 07/05/23 1740)  sodium chloride 0.9 % bolus 1,000 mL (0 mLs Intravenous Stopped 07/05/23 1831)    ED Course/ Medical Decision Making/ A&P                                 Medical Decision Making Amount and/or Complexity of Data Reviewed Labs: ordered.  Risk Prescription drug management. Decision regarding hospitalization.    This patient presents to the ED for concern of abdominal pain, this involves an extensive number of treatment options, and is a complaint that carries with it a high risk of complications and morbidity.  The differential diagnosis includes gastroenteritis, colitis, small bowel obstruction, appendicitis, cholecystitis, pancreatitis, nephrolithiasis, UTI, pyelonephritis   Co morbidities that complicate the patient evaluation  afib, anxiety, CHF, GERD, HTN, heart transplant, hyperparathyroidism, abdominal hysterectomy, appendectomy    Additional history obtained:  Dr. Clelia Croft PCP   Lab Tests:  I Ordered, and personally interpreted labs.   The pertinent results include: CBC with differential: Leukocytosis at 18.4.  No anemia. CMP: mild hyponatremia at 134; creatinine mildly elevated at 1.13 Lipase: within normal limits UA: not concerning for infection   Imaging Studies ordered:  I ordered imaging studies including  -CT Abd/Pelvis with contrast: evaluate for structural/surgical etiology of patients' severe abdominal pain.  I independently visualized and interpreted imaging I agree with the radiologist interpretation   Problem List / ED Course / Critical interventions / Medication management  Admitting patient for SBO. PMHx afib on Xarelto, HTN, heart transplant on immunosuppressants, hyperparathyroidism Patient presents to ED concerned for 3 days of abdominal pain and nausea. Patient has not been able to pass gas or BM x3 days. Patient has not tolerated PO intake except for daily medication and one piece of toast around noon today.  Physical exam with generalized tenderness to palpation of abdomen. Patient is afebrile with stable vitals. CBC with leukocytosis 18.4.  Lipase within normal limits.  CMP reassuring. CT showing proximal colonic obstruction with abnormal dilation of cecum and proximal colon up to the level of the mid transverse colon. Provided patient with IV fluids. Patient without severe nausea or vomiting so no NG tube was placed at this point. Consulted with Dr. Sheliah Hatch (general surgery) who recommends medicine admit.  Dr. Lazarus Salines Admitting provider.  I have reviewed the patients home medicines and have made adjustments as needed   Social Determinants of Health:  none         Final Clinical Impression(s) / ED Diagnoses Final diagnoses:  Other complete intestinal obstruction [facility])    Rx / DC Orders ED Discharge Orders     None         Dorthy Cooler, [place] 07/05/23 2134    Rozelle Logan, DO 07/06/23 8119

## 2023-07-05 NOTE — Assessment & Plan Note (Signed)
Hold eliquis as pt may need surgical intervention

## 2023-07-05 NOTE — ED Triage Notes (Signed)
Pt to ED from PCP requesting a CT following an abd xray showing:  " Air-filled probable bowel loop in the right upper quadrant most likely within the hepatic flexure."   Pt went to PCP for severe abd pain x 3 days. No bowel movements and not able to pass gas. Pt tried a suppository at home without success. Nausea without vomiting.

## 2023-07-05 NOTE — Assessment & Plan Note (Signed)
Npo except for sips with meds  General Surgery is aware  No significant N/V so will hold off on NG tube for now

## 2023-07-05 NOTE — Progress Notes (Signed)
Plan of Care Note for accepted transfer   Patient: [name] [name] MRN: [id number]   DOA: 07/05/2023  Facility requesting transfer: [facility]  Requesting Provider: Valrie Hart, PA; Coralee Pesa, DO  Reason for transfer: [facility]  Facility course:   82 y/o with hx heart transplant in '09 (follows at [facility]), on immunosuppression, Afib, DVT, on AC, HTN, hypothyroid, parathyroidectomy. Presented with 3 days of abd pain, nausea, no vomiting, + obstipation x 3 days as well. WBC 18. CT with large bowel obstruction with dilation of proximal colon to 7.3 cm; Transition at mid to distal colon with desiccated stool ball and underlying colonic stricture suspected at this level. No NG tube in place. EDP spoke with Dr. Sheliah Hatch of general surg who agrees with holding off on NG as long as no vomiting, no other immediate intervention, general surgery consult in the AM. Will keep NPO, asked ED to start on mIVF.    Plan of care: The patient is accepted for admission to Telemetry unit, at [facility]..    Author: Dolly Rias, MD 07/05/2023  Check [email or web address] for on-call coverage.  Nursing staff, Please call [facility] Admits & Consults System-Wide number on Amion as soon as patient's arrival, so appropriate admitting provider can evaluate the pt.

## 2023-07-06 ENCOUNTER — Inpatient Hospital Stay (HOSPITAL_COMMUNITY): Payer: Medicare Other

## 2023-07-06 ENCOUNTER — Encounter (HOSPITAL_COMMUNITY): Payer: Self-pay | Admitting: Internal Medicine

## 2023-07-06 DIAGNOSIS — K56609 Unspecified intestinal obstruction, unspecified as to partial versus complete obstruction: Secondary | ICD-10-CM | POA: Diagnosis not present

## 2023-07-06 DIAGNOSIS — I10 Essential (primary) hypertension: Secondary | ICD-10-CM | POA: Diagnosis not present

## 2023-07-06 DIAGNOSIS — Z941 Heart transplant status: Secondary | ICD-10-CM | POA: Diagnosis not present

## 2023-07-06 LAB — MAGNESIUM: Magnesium: 1.8 mg/dL (ref 1.7–2.4)

## 2023-07-06 LAB — COMPREHENSIVE METABOLIC PANEL
ALT: 11 U/L (ref 0–44)
AST: 14 U/L — ABNORMAL LOW (ref 15–41)
Albumin: 2.6 g/dL — ABNORMAL LOW (ref 3.5–5.0)
Alkaline Phosphatase: 80 U/L (ref 38–126)
Anion gap: 7 (ref 5–15)
BUN: 15 mg/dL (ref 8–23)
CO2: 21 mmol/L — ABNORMAL LOW (ref 22–32)
Calcium: 8.4 mg/dL — ABNORMAL LOW (ref 8.9–10.3)
Chloride: 108 mmol/L (ref 98–111)
Creatinine, Ser: 0.93 mg/dL (ref 0.44–1.00)
GFR, Estimated: >60 mL/min (ref >60)
Glucose, Bld: 134 mg/dL — ABNORMAL HIGH (ref 70–99)
Potassium: 3.8 mmol/L (ref 3.5–5.1)
Sodium: 136 mmol/L (ref 135–145)
Total Bilirubin: 0.8 mg/dL (ref <1.2)
Total Protein: 5.6 g/dL — ABNORMAL LOW (ref 6.5–8.1)

## 2023-07-06 LAB — CBC
HCT: 39.7 % (ref 36.0–46.0)
Hemoglobin: 13.5 g/dL (ref 12.0–15.0)
MCH: 30.6 pg (ref 26.0–34.0)
MCHC: 34 g/dL (ref 30.0–36.0)
MCV: 90 fL (ref 80.0–100.0)
Platelets: 269 10*3/uL (ref 150–400)
RBC: 4.41 MIL/uL (ref 3.87–5.11)
RDW: 13.2 % (ref 11.5–15.5)
WBC: 15.8 10*3/uL — ABNORMAL HIGH (ref 4.0–10.5)
nRBC: 0 % (ref 0.0–0.2)

## 2023-07-06 LAB — PHOSPHORUS: Phosphorus: 3.1 mg/dL (ref 2.5–4.6)

## 2023-07-06 MED ORDER — HYDROMORPHONE HCL 1 MG/ML IJ SOLN
0.2500 mg | Freq: Once | INTRAMUSCULAR | Status: AC
  Administered 2023-07-06: 0.25 mg via INTRAVENOUS
  Filled 2023-07-06: qty 0.5

## 2023-07-06 MED ORDER — HYDROMORPHONE HCL 1 MG/ML IJ SOLN
1.0000 mg | Freq: Once | INTRAMUSCULAR | Status: AC
  Administered 2023-07-06: 1 mg via INTRAVENOUS
  Filled 2023-07-06: qty 1

## 2023-07-06 MED ORDER — MAGNESIUM CITRATE PO SOLN
1.0000 | Freq: Once | ORAL | Status: AC
  Administered 2023-07-06: 1 via ORAL
  Filled 2023-07-06: qty 296

## 2023-07-06 MED ORDER — BOOST / RESOURCE BREEZE PO LIQD CUSTOM
1.0000 | Freq: Three times a day (TID) | ORAL | Status: DC
  Administered 2023-07-07: 1 via ORAL

## 2023-07-06 MED ORDER — ALUM & MAG HYDROXIDE-SIMETH 200-200-20 MG/5ML PO SUSP
30.0000 mL | ORAL | Status: DC | PRN
  Administered 2023-07-06 – 2023-07-14 (×2): 30 mL via ORAL
  Filled 2023-07-06 (×2): qty 30

## 2023-07-06 MED ORDER — SODIUM CHLORIDE 0.9 % IV SOLN: INTRAVENOUS | Status: DC

## 2023-07-06 MED ORDER — POLYETHYLENE GLYCOL 3350 17 G PO PACK
17.0000 g | PACK | ORAL | Status: DC
  Administered 2023-07-06 – 2023-07-09 (×2): 17 g via ORAL
  Filled 2023-07-06 (×4): qty 1

## 2023-07-06 MED ORDER — LOSARTAN POTASSIUM 50 MG PO TABS
50.0000 mg | ORAL_TABLET | Freq: Every day | ORAL | Status: DC
  Administered 2023-07-06 – 2023-07-15 (×9): 50 mg via ORAL
  Filled 2023-07-06 (×9): qty 1

## 2023-07-06 MED ORDER — MAGNESIUM CITRATE PO SOLN
300.0000 mL | Freq: Once | ORAL | Status: DC
  Filled 2023-07-06: qty 592

## 2023-07-06 NOTE — Consult Note (Signed)
Reason for Consult: abnormal CT Referring Physician: Hospital team  [name] [name] is an 82 y.o. female.  HPI: Patient office computer chart and her hospital computer chart reviewed and and she was seen and examined and she treated herself with magnesium oxide for constipation and she has been doing that since her transplant and this week she has not had a bowel movement and even tried some senna and her last colonoscopy was in 2018 with some transverse inflammation but biopsies were negative and she has been seen by me in the last year or 2 more for nausea than lower bowel issues and she had a CT which showed a obstruction probably from stool and surgery recommended GI consult and her dad did have colon cancer and right now she does not have much appetite and does not want to eat not had any vomiting  Past Medical History:  Diagnosis Date   A-fib (HCC)    resolved   Anxiety    Arrhythmia    bradycardia   CHF (congestive heart failure) (HCC)    resolved   Depression    DVT (deep venous thrombosis) (HCC)    GERD (gastroesophageal reflux disease)    Heart transplanted (HCC)    Followed at [facility] - Dr. Laurence Compton   HTN (hypertension)    Hyperparathyroidism [facility])    resolved   Osteoporosis    Pacemaker    resolved    Past Surgical History:  Procedure Laterality Date   ABDOMINAL HYSTERECTOMY     ABLATION  2007   ABLATION ON ENDOMETRIOSIS     APPENDECTOMY     BREAST EXCISIONAL BIOPSY Left 1986   benign   CARDIAC CATHETERIZATION  2009   HEART TRANSPLANT  2009   HEART TRANSPLANT     NECK SURGERY     removal of parathyroid   PACEMAKER INSERTION  2005   TOTAL ABDOMINAL HYSTERECTOMY W/ BILATERAL SALPINGOOPHORECTOMY      Family History  Problem Relation Age of Onset   Heart attack Mother    Heart disease Mother    Colon cancer Father    Ulcers Father    Liver cancer Father    Colon polyps Sister    Heart attack Maternal Grandmother    Heart attack Paternal  Grandmother    Diabetes type II Paternal Grandmother    OCD Daughter     Social History:  reports that she has quit smoking. She has never used smokeless tobacco. She reports that she does not drink alcohol and does not use drugs.  Allergies:  Allergies  Allergen Reactions   Antihistamines, Chlorpheniramine-Type Other (See Comments)    Numbness   Augmentin [Amoxicillin-Pot Clavulanate]     Nausea only   Cyclobenzaprine Other (See Comments)   Fentanyl Other (See Comments)    Other reaction(s): Confusion (intolerance)   Metronidazole     Unknown reaction    Valtrex [Valacyclovir]     "Made my chin numb"   Codeine Nausea Only    Medications: I have reviewed the patient's current medications.  Results for orders placed or performed during the hospital encounter of 07/05/23 (from the past 48 hour(s))  Lipase, blood     Status: Abnormal   Collection Time: 07/05/23  3:14 PM  Result Value Ref Range   Lipase <10 (L) 11 - 51 U/L    Comment: Performed at [facility], [address] [place], [place] [address]  Comprehensive metabolic panel     Status: Abnormal   Collection  Time: 07/05/23  3:14 PM  Result Value Ref Range   Sodium 134 (L) 135 - 145 mmol/L   Potassium 4.1 3.5 - 5.1 mmol/L   Chloride 101 98 - 111 mmol/L   CO2 25 22 - 32 mmol/L   Glucose, Bld 120 (H) 70 - 99 mg/dL    Comment: Glucose reference range applies only to samples taken after fasting for at least 8 hours.   BUN 17 8 - 23 mg/dL   Creatinine, Ser 1.61 (H) 0.44 - 1.00 mg/dL   Calcium 9.9 8.9 - 09.6 mg/dL   Total Protein 7.5 6.5 - 8.1 g/dL   Albumin 3.9 3.5 - 5.0 g/dL   AST 17 15 - 41 U/L   ALT 9 0 - 44 U/L   Alkaline Phosphatase 90 38 - 126 U/L   Total Bilirubin 0.7 <1.2 mg/dL   GFR, Estimated 48 (L) >60 mL/min    Comment: (NOTE) Calculated using the CKD-EPI Creatinine Equation (2021)    Anion gap 8 5 - 15    Comment: Performed at [facility], [address] [place], [place] [address]  CBC     Status: Abnormal   Collection Time: 07/05/23  3:14 PM  Result Value Ref Range   WBC 18.4 (H) 4.0 - 10.5 K/uL   RBC 4.87 3.87 - 5.11 MIL/uL   Hemoglobin 15.0 12.0 - 15.0 g/dL   HCT 98.1 19.1 - 47.8 %   MCV 89.3 80.0 - 100.0 fL   MCH 30.8 26.0 - 34.0 pg   MCHC 34.5 30.0 - 36.0 g/dL   RDW 29.5 62.1 - 30.8 %   Platelets 325 150 - 400 K/uL   nRBC 0.0 0.0 - 0.2 %    Comment: Performed at [facility], [address] [place], [place] [address]  Urinalysis, Routine w reflex microscopic -Urine, Clean Catch     Status: Abnormal   Collection Time: 07/05/23  3:14 PM  Result Value Ref Range   Color, Urine YELLOW YELLOW   APPearance CLEAR CLEAR   Specific Gravity, Urine 1.044 (H) 1.005 - 1.030   pH 6.5 5.0 - 8.0   Glucose, UA NEGATIVE NEGATIVE mg/dL   Hgb urine dipstick NEGATIVE NEGATIVE   Bilirubin Urine NEGATIVE NEGATIVE   Ketones, ur NEGATIVE NEGATIVE mg/dL   Protein, ur TRACE (A) NEGATIVE mg/dL   Nitrite NEGATIVE NEGATIVE   Leukocytes,Ua NEGATIVE NEGATIVE    Comment: Performed at [facility], [address] [place], [place] [address]  Magnesium     Status: None   Collection Time: 07/06/23  6:27 AM  Result Value Ref Range   Magnesium 1.8 1.7 - 2.4 mg/dL    Comment: Performed at [facility], [address], [place], [place] [address]  Phosphorus     Status: None   Collection Time: 07/06/23  6:27 AM  Result Value Ref Range   Phosphorus 3.1 2.5 - 4.6 mg/dL    Comment: Performed at [facility], [address], [place], [place] [address]  Comprehensive metabolic panel     Status: Abnormal   Collection Time: 07/06/23  6:27 AM  Result Value Ref Range   Sodium 136 135 - 145 mmol/L   Potassium 3.8 3.5 - 5.1 mmol/L   Chloride 108 98 - 111 mmol/L   CO2 21 (L) 22 - 32 mmol/L   Glucose, Bld 134 (H) 70 - 99 mg/dL    Comment: Glucose reference range applies only to samples taken after fasting  for at  least 8 hours.   BUN 15 8 - 23 mg/dL   Creatinine, Ser 0.86 0.44 - 1.00 mg/dL   Calcium 8.4 (L) 8.9 - 10.3 mg/dL   Total Protein 5.6 (L) 6.5 - 8.1 g/dL   Albumin 2.6 (L) 3.5 - 5.0 g/dL   AST 14 (L) 15 - 41 U/L   ALT 11 0 - 44 U/L   Alkaline Phosphatase 80 38 - 126 U/L   Total Bilirubin 0.8 <1.2 mg/dL   GFR, Estimated >57 >84 mL/min    Comment: (NOTE) Calculated using the CKD-EPI Creatinine Equation (2021)    Anion gap 7 5 - 15    Comment: Performed at [facility], [address], [place], [place] [address]  CBC     Status: Abnormal   Collection Time: 07/06/23  6:27 AM  Result Value Ref Range   WBC 15.8 (H) 4.0 - 10.5 K/uL   RBC 4.41 3.87 - 5.11 MIL/uL   Hemoglobin 13.5 12.0 - 15.0 g/dL   HCT 52.8 41.3 - 24.4 %   MCV 90.0 80.0 - 100.0 fL   MCH 30.6 26.0 - 34.0 pg   MCHC 34.0 30.0 - 36.0 g/dL   RDW 01.0 27.2 - 53.6 %   Platelets 269 150 - 400 K/uL   nRBC 0.0 0.0 - 0.2 %    Comment: Performed at [facility], [address], [place], [place] [address]    DG CHEST PORT 1 VIEW  Result Date: 07/06/2023 CLINICAL DATA:  Pre-op cardiovascular examination. EXAM: PORTABLE CHEST 1 VIEW COMPARISON:  02/16/2023 FINDINGS: Chronic elevation of the right hemidiaphragm. No evidence for focal airspace disease and no overt pulmonary edema. Heart size is within normal limits and grossly stable. Median sternotomy wires are present. Atherosclerotic calcifications at the aortic arch. IMPRESSION: 1. No acute cardiopulmonary disease. 2. Chronic elevation of the right hemidiaphragm. Electronically Signed   By: Richarda Overlie M.D.   On: 07/06/2023 08:42   CT ABDOMEN PELVIS W CONTRAST  Result Date: 07/05/2023 CLINICAL DATA:  Nonlocalized acute abdominal pain. EXAM: CT ABDOMEN AND PELVIS WITH CONTRAST TECHNIQUE: Multidetector CT imaging of the abdomen and pelvis was performed using the standard protocol following bolus administration of intravenous contrast. RADIATION DOSE REDUCTION: This exam was  performed according to the departmental dose-optimization program which includes automated exposure control, adjustment of the mA and/or kV according to patient size and/or use of iterative reconstruction technique. CONTRAST:  80mL OMNIPAQUE IOHEXOL 300 MG/ML  SOLN COMPARISON:  03/15/2022. FINDINGS: Lower chest: No acute abnormality. Hepatobiliary: No focal liver abnormality is seen. No gallstones, gallbladder wall thickening, or biliary dilatation. Pancreas: Unremarkable. No pancreatic ductal dilatation or surrounding inflammatory changes. Spleen: Normal in size without focal abnormality. Adrenals/Urinary Tract: Normal appearance of the adrenal glands. No kidney mass, nephrolithiasis or signs of obstructive uropathy. Stomach/Bowel: Moderate hiatal hernia. Stomach otherwise unremarkable. There is no small bowel wall thickening, inflammation or distension. Atypical location of the cecum within the right upper quadrant of the abdomen. There is abnormal dilatation of the cecum and proximal colon up to the level of the mid transverse colon. The dilated colon measures up to 7.3 cm and there is mild wall thickening with surrounding soft tissue stranding, image 53/2. No pneumatosis. Abrupt transition to decreased caliber mid and distal transverse colon is identified at this level. At the transition point there is a desiccated stool ball measuring 5.6 by 4.8 cm. Underlying colonic stricture either benign or malignant is suspected at this  level. Distal colonic diverticulosis noted without signs of acute diverticulitis. Vascular/Lymphatic: Aortic atherosclerosis. No aneurysm. No signs of abdominopelvic adenopathy. Reproductive: Status post hysterectomy. No adnexal masses. Other: No free fluid or fluid collections. No signs of pneumoperitoneum. Musculoskeletal: No acute or significant osseous findings. IMPRESSION: 1. Signs of proximal colonic obstruction with abnormal dilatation of the cecum and proximal colon up to the level  of the mid transverse colon. The dilated proximal colon measures up to 7.3 cm and there is mild wall thickening with surrounding soft tissue stranding. Abrupt transition to decreased caliber mid and distal colon is identified at this level. At the transition point there is a desiccated stool ball measuring 5.6 by 4.8 cm. Underlying colonic stricture either benign or malignant is suspected at this level. Surgical consultation is advised. 2. Moderate hiatal hernia. 3. Distal colonic diverticulosis without signs of acute diverticulitis. 4.  Aortic Atherosclerosis (ICD10-I70.0). Electronically Signed   By: Signa Kell M.D.   On: 07/05/2023 19:14    ROS negative except above Blood pressure (!) 160/58, pulse 81, temperature 99.3 F (37.4 C), temperature source Oral, resp. rate 16, height 5\' 3"  (1.6 m), weight 82.1 kg, SpO2 91%. Physical Exam vital signs stable afebrile no acute distress abdomen is a little sore throughout but no guarding or rebound soft CT reviewed demonstrates okay white count went from 18-15 and hemoglobin and platelets okay  Assessment/Plan: Colon obstruction questionably from a large stool ball Plan: I discussed the plan with her to include clear liquid diet and laxatives from above since enemas and suppositories will probably not be helpful this far out the colon and if we can clean her out we may proceed with colonoscopy later this weekend or next week and will follow with you  [facility] E 07/06/2023, 3:14 PM

## 2023-07-06 NOTE — Progress Notes (Signed)
PROGRESS NOTE    [name] [name]  ION:[id number] DOB: 09/29/1940 DOA: 07/05/2023 PCP: Cleatis Polka., MD    Brief Narrative:   [name] [name] is a 82 y.o. female with past medical history significant for HTN, hypothyroidism, history of heart transplant followed by [facility] since 2009, history of parathyroidectomy, OSA not on CPAP, history of DVT on Eliquis, history of C. difficile colitis who presented to [facility] ED on 11/7 by direction of her PCP with 3-day history of progressive abdominal pain associated with nausea and obstipation with concerns of bowel obstruction.  In the ED, temperature 98.2 F, HR 95, RR 16, BP 156/67, SpO2 95% on room air.  WBC 18.4, hemoglobin 15.0, platelet count 325.  Sodium 134, potassium 4.1, chloride 101, CO2 25, glucose 120, BUN 17, creatinine 1.13.  Lipase less than 10.  AST 17, ALT 9, total bilirubin 0.7.  Urinalysis unrevealing.  Chest x-ray with no acute cardiopulmonary disease process.  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  General surgery consulted.  Patient was started on IV fluids and kept NPO.  TRH consulted for admission and patient was transferred to [facility] for further evaluation and management.  Assessment & Plan:   Large bowel obstruction Patient presenting to the ED by discretion of her PCP after findings/concern of bowel obstruction noted on outpatient x-ray.  Patient  reports progressive abdominal pain associate with nausea and obstipation over the last 3 days.  Patient was afebrile with elevated WBC count of 18.4.  CT abdomen/pelvis with signs of proximal colonic obstruction with abnormal dilation of the cecum/proximal colon up to the level of the mid transverse colon, dilated proximal colon measuring up to 7.3 cm with wall thickening/surrounding soft tissue stranding with abrupt transition mid/distal colon with desiccated stool ball at transition point measuring 5.6 x 4.8 cm with underlying colonic stricture either benign/malignant suspected.  Patient was seen by general surgery, recommended GI consultation for consideration of colonoscopy. -- General Surgery following, appreciate assistance -- [facility] gastroenterology consulted for consideration of colonoscopy -- N.p.o. -- NS at 75 mL/h -- Awaiting further recommendations from GI/general surgery.  Leukocytosis WBC count elevated 18.4 on admission, likely reactive versus hemoconcentration in the setting of poor oral intake in the days preceding hospitalization due to large bowel obstruction. -- WBC 18.4>15.8 -- Continue IV fluid as above -- CBC daily  Essential hypertension Home regimen includes diltiazem 120 mg p.o. twice daily, losartan 100 mg p.o. twice daily, -- Diltiazem 120 mg p.o. twice daily -- Hold home losartan for now  Hypothyroidism -- Levothyroxine 50 mcg p.o. daily  History of heart transplant Follows with [facility] since 2009. -- Mycophenolate 250 mg p.o. twice daily -- Tacrolimus 0.5 mg p.o. twice daily  History of DVT --Holding home Xarelto  Anxiety -- Clonazepam 0.5 m p.o. nightly as needed anxiety  GERD On omeprazole outpatient. -- Continue Protonix 40 mg IV every 12 hours while inpatient  Class I obesity Body mass index is 32.06 kg/m.  Complicates all facets of care.  DVT prophylaxis: SCDs Start: 07/05/23  2338    Code Status: Full Code Family  Communication: Updated family present at bedside this morning  Disposition Plan:  Level of care: Telemetry Medical Status is: Inpatient Remains inpatient appropriate because: Pending further recommendations from GI/general surgery, anticipate likely need of colonoscopy    Consultants:  General surgery [facility]  Procedures:  None  Antimicrobials:  None   Subjective: Patient seen examined bedside, resting calmly.  Lying in bed.  Continues with abdominal distention, mild abdominal pain.  Denies flatus, no bowel movement.  Discussed with general surgery PA, recommend GI evaluation for consideration of colonoscopy.  No other specific complaints or concerns at this time.  Updated family present at bedside.  Denies headache, no visual changes, no chest pain, no palpitations, no shortness of breath, no fever/chills/night sweats, no nausea/vomiting, no cough/congestion, no focal weakness, no fatigue, no paresthesias.  No acute events overnight per nursing staff.  Objective: Vitals:   07/05/23 2153 07/05/23 2237 07/06/23 0415 07/06/23 0714  BP: (!) 164/67 (!) 160/68 (!) 161/54 (!) 160/58  Pulse: 85 84 83 81  Resp:  20 16 16   Temp:  98.6 F (37 C) 98.1 F (36.7 C) 99.3 F (37.4 C)  TempSrc:  Oral Oral Oral  SpO2: 93% 92% 91% 91%  Weight:      Height:        Intake/Output Summary (Last 24 hours) at 07/06/2023 1043 Last data filed at 07/06/2023 0655 Gross per 24 hour  Intake 800.2 ml  Output --  Net 800.2 ml   Filed Weights   07/05/23 1508  Weight: 82.1 kg    Examination:  Physical Exam: GEN: NAD, alert and oriented x 3, obese HEENT: NCAT, PERRL, EOMI, sclera clear, MMM PULM: CTAB w/o wheezes/crackles, normal respiratory effort, on room air CV: RRR w/o M/G/R GI: abd soft, + distention, no appreciable bowel sounds, no R/G/M MSK: no peripheral edema, muscle strength globally intact 5/5 bilateral upper/lower extremities NEURO: CN II-XII intact, no focal  deficits, sensation to light touch intact PSYCH: normal mood/affect Integumentary: dry/intact, no rashes or wounds    Data Reviewed: I have personally reviewed following labs and imaging studies  CBC: Recent Labs  Lab 07/05/23 1514 07/06/23 0627  WBC 18.4* 15.8*  HGB 15.0 13.5  HCT 43.5 39.7  MCV 89.3 90.0  PLT 325 269   Basic Metabolic Panel: Recent Labs  Lab 07/05/23 1514 07/06/23 0627  NA 134* 136  K 4.1 3.8  CL 101 108  CO2 25 21*  GLUCOSE 120* 134*  BUN 17 15  CREATININE 1.13* 0.93  CALCIUM 9.9 8.4*  MG  --  1.8  PHOS  --  3.1   GFR: Estimated Creatinine Clearance: 45.7 mL/min (by C-G formula based on SCr of 0.93 mg/dL). Liver Function Tests: Recent Labs  Lab 07/05/23 1514 07/06/23 0627  AST 17 14*  ALT 9 11  ALKPHOS 90 80  BILITOT 0.7 0.8  PROT 7.5 5.6*  ALBUMIN 3.9 2.6*   Recent Labs  Lab 07/05/23 1514  LIPASE <10*   No results for input(s): "AMMONIA" in the last 168 hours. Coagulation Profile: No results for input(s): "INR", "PROTIME" in the last 168 hours. Cardiac Enzymes: No results for input(s): "CKTOTAL", "CKMB", "CKMBINDEX", "TROPONINI" in the last 168 hours. BNP (last 3 results) No results for input(s): "PROBNP" in the last 8760 hours. HbA1C: No results for input(s): "HGBA1C" in the last 72 hours. CBG: No results for input(s): "GLUCAP" in the last 168 hours. Lipid Profile: No results for input(s): "  CHOL", "HDL", "LDLCALC", "TRIG", "CHOLHDL", "LDLDIRECT" in the last 72 hours. Thyroid Function Tests: No results for input(s): "TSH", "T4TOTAL", "FREET4", "T3FREE", "THYROIDAB" in the last 72 hours. Anemia Panel: No results for input(s): "VITAMINB12", "FOLATE", "FERRITIN", "TIBC", "IRON", "RETICCTPCT" in the last 72 hours. Sepsis Labs: No results for input(s): "PROCALCITON", "LATICACIDVEN" in the last 168 hours.  No results found for this or any previous visit (from the past 240 hour(s)).       Radiology Studies: DG CHEST PORT 1  VIEW  Result Date: 07/06/2023 CLINICAL DATA:  Pre-op cardiovascular examination. EXAM: PORTABLE CHEST 1 VIEW COMPARISON:  02/16/2023 FINDINGS: Chronic elevation of the right hemidiaphragm. No evidence for focal airspace disease and no overt pulmonary edema. Heart size is within normal limits and grossly stable. Median sternotomy wires are present. Atherosclerotic calcifications at the aortic arch. IMPRESSION: 1. No acute cardiopulmonary disease. 2. Chronic elevation of the right hemidiaphragm. Electronically Signed   By: Richarda Overlie M.D.   On: 07/06/2023 08:42   CT ABDOMEN PELVIS W CONTRAST  Result Date: 07/05/2023 CLINICAL DATA:  Nonlocalized acute abdominal pain. EXAM: CT ABDOMEN AND PELVIS WITH CONTRAST TECHNIQUE: Multidetector CT imaging of the abdomen and pelvis was performed using the standard protocol following bolus administration of intravenous contrast. RADIATION DOSE REDUCTION: This exam was performed according to the departmental dose-optimization program which includes automated exposure control, adjustment of the mA and/or kV according to patient size and/or use of iterative reconstruction technique. CONTRAST:  80mL OMNIPAQUE IOHEXOL 300 MG/ML  SOLN COMPARISON:  03/15/2022. FINDINGS: Lower chest: No acute abnormality. Hepatobiliary: No focal liver abnormality is seen. No gallstones, gallbladder wall thickening, or biliary dilatation. Pancreas: Unremarkable. No pancreatic ductal dilatation or surrounding inflammatory changes. Spleen: Normal in size without focal abnormality. Adrenals/Urinary Tract: Normal appearance of the adrenal glands. No kidney mass, nephrolithiasis or signs of obstructive uropathy. Stomach/Bowel: Moderate hiatal hernia. Stomach otherwise unremarkable. There is no small bowel wall thickening, inflammation or distension. Atypical location of the cecum within the right upper quadrant of the abdomen. There is abnormal dilatation of the cecum and proximal colon up to the level of  the mid transverse colon. The dilated colon measures up to 7.3 cm and there is mild wall thickening with surrounding soft tissue stranding, image 53/2. No pneumatosis. Abrupt transition to decreased caliber mid and distal transverse colon is identified at this level. At the transition point there is a desiccated stool ball measuring 5.6 by 4.8 cm. Underlying colonic stricture either benign or malignant is suspected at this level. Distal colonic diverticulosis noted without signs of acute diverticulitis. Vascular/Lymphatic: Aortic atherosclerosis. No aneurysm. No signs of abdominopelvic adenopathy. Reproductive: Status post hysterectomy. No adnexal masses. Other: No free fluid or fluid collections. No signs of pneumoperitoneum. Musculoskeletal: No acute or significant osseous findings. IMPRESSION: 1. Signs of proximal colonic obstruction with abnormal dilatation of the cecum and proximal colon up to the level of the mid transverse colon. The dilated proximal colon measures up to 7.3 cm and there is mild wall thickening with surrounding soft tissue stranding. Abrupt transition to decreased caliber mid and distal colon is identified at this level. At the transition point there is a desiccated stool ball measuring 5.6 by 4.8 cm. Underlying colonic stricture either benign or malignant is suspected at this level. Surgical consultation is advised. 2. Moderate hiatal hernia. 3. Distal colonic diverticulosis without signs of acute diverticulitis. 4.  Aortic Atherosclerosis (ICD10-I70.0). Electronically Signed   By: Signa Kell M.D.   On: 07/05/2023 19:14  Scheduled Meds:  diltiazem  120 mg Oral BID   fluticasone furoate-vilanterol  1 puff Inhalation Daily   levothyroxine  50 mcg Oral Q0600   mycophenolate  250 mg Oral BID   pantoprazole (PROTONIX) IV  40 mg Intravenous Q12H   tacrolimus  0.5 mg Oral BID   Continuous Infusions:  sodium chloride 75 mL/hr at 07/06/23 0928     LOS: 1 day    Time  spent: 53 minutes spent on chart review, discussion with nursing staff, consultants, updating family and interview/physical exam; more than 50% of that time was spent in counseling and/or coordination of care.    Alvira Philips [place], DO [facility] Available via Epic secure chat 7am-7pm After these hours, please refer to coverage provider listed on [email or web address] 07/06/2023, 10:43 AM

## 2023-07-06 NOTE — Consult Note (Signed)
[facility] Surgery Consult Note  [name] [name] 22-Sep-1940  [id number].    Requesting MD: Valrie Hart PA Chief Complaint/Reason for Consult: colonic obstruction  HPI:  [name] [name] is an 82 y.o. female with h/o heart transplant 2009 at [facility] (on CellCept Prograf, followed by Dr. Dot Lanes), hx recurrent DVT on eliquis (last dose 11/7 in AM), CKD-IIIa, HTN, GERD, Hypothyroidism, Anxiety/depression who presented to the ED for evaluation of abdominal pain. This began about 1 week ago. Pain has been intermittent and in different locations on her abdomen. Pain worsened over the last 2 days. She reports nausea without vomiting, as well as poor PO intake. At baseline she struggles between diarrhea and constipation. She has been more constipated recently. Tried increasing magnesium without benefit. Last BM was 3 days ago. Does not think she's passed gas today. Denies any recent blood in stool or recent weight loss. She thinks her last colonoscopy was around 2022 and showed some diverticulosis. This was performed by Dr. Matthias Hughs, and she is now followed by Dr. Ewing Schlein.  ED work up included CT scan which shows signs of proximal colonic obstruction with abnormal dilatation of the cecum and proximal colon up to the level of the mid transverse colon; the dilated proximal colon measures up to 7.3 cm and there is mild wall thickening with surrounding soft tissue stranding; abrupt transition to decreased caliber mid and distal colon is identified at this level; at the transition point there is a desiccated stool ball measuring 5.6 by 4.8 cm; underlying colonic stricture either benign or malignant is suspected at this level. Patient was admitted to the medical service. General surgery asked to see.  Abdominal surgical history: open appendectomy, hysterectomy Lives at [facility] with husband, independent living Ambulates without assistive device Has a caregiver that helps M-F 9a-2p and 2 nights  per week  ROS: ROS  All systems reviewed and otherwise negative except for as above  Family History  Problem Relation Age of Onset   Heart attack Mother    Heart disease Mother    Colon cancer Father    Ulcers Father    Liver cancer Father    Colon polyps Sister    Heart attack Maternal Grandmother    Heart attack Paternal Grandmother    Diabetes type II Paternal Grandmother    OCD Daughter     Past Medical History:  Diagnosis Date   A-fib (HCC)    resolved   Anxiety    Arrhythmia    bradycardia   CHF (congestive heart failure) (HCC)    resolved   Depression    DVT (deep venous thrombosis) (HCC)    GERD (gastroesophageal reflux disease)    Heart transplanted (HCC)    Followed at [facility] - Dr. Laurence Compton   HTN (hypertension)    Hyperparathyroidism [facility])    resolved   Osteoporosis    Pacemaker    resolved    Past Surgical History:  Procedure Laterality Date   ABDOMINAL HYSTERECTOMY     ABLATION  2007   ABLATION ON ENDOMETRIOSIS     APPENDECTOMY     BREAST EXCISIONAL BIOPSY Left 1986   benign   CARDIAC CATHETERIZATION  2009   HEART TRANSPLANT  2009   HEART TRANSPLANT     NECK SURGERY     removal of parathyroid   PACEMAKER INSERTION  2005   TOTAL ABDOMINAL HYSTERECTOMY W/ BILATERAL SALPINGOOPHORECTOMY      Social History:  reports that she has quit smoking. She has  never used smokeless tobacco. She reports that she does not drink alcohol and does not use drugs.  Allergies:  Allergies  Allergen Reactions   Antihistamines, Chlorpheniramine-Type Other (See Comments)    Numbness   Augmentin [Amoxicillin-Pot Clavulanate]     Nausea only   Cyclobenzaprine Other (See Comments)   Fentanyl Other (See Comments)    Other reaction(s): Confusion (intolerance)   Metronidazole     Unknown reaction    Valtrex [Valacyclovir]     "Made my chin numb"   Codeine Nausea Only    Medications Prior to Admission  Medication Sig Dispense Refill   acetaminophen  (TYLENOL) 500 MG tablet Take by mouth.     apixaban (ELIQUIS) 5 MG TABS tablet Take 5 mg by mouth 2 (two) times daily.     aspirin 81 MG tablet Take 81 mg by mouth daily.     calcium carbonate (TUMS - DOSED IN MG ELEMENTAL CALCIUM) 500 MG chewable tablet Chew 1 tablet (200 mg of elemental calcium total) by mouth 3 (three) times daily as needed for indigestion or heartburn.     clonazePAM (KLONOPIN) 1 MG tablet Take 1 mg by mouth at bedtime.      cyanocobalamin 500 MCG tablet Take 500 mcg by mouth daily.     desvenlafaxine (PRISTIQ) 50 MG 24 hr tablet Take 50 mg by mouth daily.     diltiazem (CARDIZEM CD) 120 MG 24 hr capsule Take 120 mg by mouth 2 (two) times daily.     famotidine (PEPCID) 20 MG tablet Take 20 mg by mouth 2 (two) times daily.     levothyroxine (SYNTHROID, LEVOTHROID) 50 MCG tablet Take 50 mcg by mouth daily before breakfast.     losartan (COZAAR) 100 MG tablet Take 100 mg by mouth 2 (two) times daily.     magnesium oxide (MAG-OX) 400 MG tablet Take 400 mg by mouth daily.     mycophenolate (CELLCEPT) 250 MG capsule Take 250 mg by mouth 2 (two) times daily.      mycophenolate (CELLCEPT) 250 MG capsule Take 1 capsule by mouth 2 (two) times daily.     omeprazole (PRILOSEC) 20 MG capsule Take by mouth.     simethicone (GAS-X) 80 MG chewable tablet Chew 1 tablet (80 mg total) by mouth every 6 (six) hours as needed for flatulence. 30 tablet 0   tacrolimus (PROGRAF) 0.5 MG capsule Take 0.5 mg by mouth 2 (two) times daily.     Vitamin D, Ergocalciferol, (DRISDOL) 50000 UNITS CAPS capsule Take 50,000 Units by mouth every Friday.      fluticasone furoate-vilanterol (BREO ELLIPTA) 100-25 MCG/INH AEPB Inhale 1 puff into the lungs daily.     Hyoscyamine Sulfate SL (LEVSIN/SL) 0.125 MG SUBL Place 0.125 mg under the tongue every 4 (four) hours as needed (pain). Up to 1.25 mg daily (Patient not taking: Reported on 05/24/2021) 20 tablet 0   loperamide (IMODIUM) 2 MG capsule Take 2 mg by mouth as  needed for diarrhea or loose stools.      Multiple Vitamins-Minerals (MULTIVITAMIN PO) Take 1 tablet by mouth daily.     phenazopyridine (PYRIDIUM) 200 MG tablet Take 1 tablet (200 mg total) by mouth 3 (three) times daily. (Patient not taking: Reported on 05/24/2021) 6 tablet 0   Probiotic Product (ALIGN) CHEW Chew by mouth.     promethazine (PHENERGAN) 12.5 MG tablet Take 1 tablet (12.5 mg total) by mouth every 6 (six) hours as needed for nausea or vomiting. 30 tablet 0  Prior to Admission medications   Medication Sig Start Date End Date Taking? Authorizing Provider  acetaminophen (TYLENOL) 500 MG tablet Take by mouth.   Yes [provider]  apixaban (ELIQUIS) 5 MG TABS tablet Take 5 mg by mouth 2 (two) times daily.   Yes [provider]  aspirin 81 MG tablet Take 81 mg by mouth daily.   Yes [provider]  calcium carbonate (TUMS - DOSED IN MG ELEMENTAL CALCIUM) 500 MG chewable tablet Chew 1 tablet (200 mg of elemental calcium total) by mouth 3 (three) times daily as needed for indigestion or heartburn. 08/18/21  Yes Albertine Grates, MD  clonazePAM (KLONOPIN) 1 MG tablet Take 1 mg by mouth at bedtime.    Yes [provider]  cyanocobalamin 500 MCG tablet Take 500 mcg by mouth daily.   Yes [provider]  desvenlafaxine (PRISTIQ) 50 MG 24 hr tablet Take 50 mg by mouth daily.   Yes [provider]  diltiazem (CARDIZEM CD) 120 MG 24 hr capsule Take 120 mg by mouth 2 (two) times daily. 07/06/16  Yes [provider]  famotidine (PEPCID) 20 MG tablet Take 20 mg by mouth 2 (two) times daily. 11/07/19  Yes [provider]  levothyroxine (SYNTHROID, LEVOTHROID) 50 MCG tablet Take 50 mcg by mouth daily before breakfast.   Yes [provider]  losartan (COZAAR) 100 MG tablet Take 100 mg by mouth 2 (two) times daily. 12/15/19  Yes [provider]  magnesium oxide (MAG-OX) 400 MG tablet Take 400 mg by mouth daily.   Yes  [provider]  mycophenolate (CELLCEPT) 250 MG capsule Take 250 mg by mouth 2 (two) times daily.    Yes [provider]  mycophenolate (CELLCEPT) 250 MG capsule Take 1 capsule by mouth 2 (two) times daily. 05/16/21  Yes [provider]  omeprazole (PRILOSEC) 20 MG capsule Take by mouth.   Yes [provider]  simethicone (GAS-X) 80 MG chewable tablet Chew 1 tablet (80 mg total) by mouth every 6 (six) hours as needed for flatulence. 12/08/20  Yes Harris, Abigail, PA-C  tacrolimus (PROGRAF) 0.5 MG capsule Take 0.5 mg by mouth 2 (two) times daily.   Yes [provider]  Vitamin D, Ergocalciferol, (DRISDOL) 50000 UNITS CAPS capsule Take 50,000 Units by mouth every Friday.    Yes [provider]  fluticasone furoate-vilanterol (BREO ELLIPTA) 100-25 MCG/INH AEPB Inhale 1 puff into the lungs daily.    [provider]  Hyoscyamine Sulfate SL (LEVSIN/SL) 0.125 MG SUBL Place 0.125 mg under the tongue every 4 (four) hours as needed (pain). Up to 1.25 mg daily Patient not taking: Reported on 05/24/2021 12/08/20   Arthor Captain, PA-C  loperamide (IMODIUM) 2 MG capsule Take 2 mg by mouth as needed for diarrhea or loose stools.     [provider]  Multiple Vitamins-Minerals (MULTIVITAMIN PO) Take 1 tablet by mouth daily.    [provider]  phenazopyridine (PYRIDIUM) 200 MG tablet Take 1 tablet (200 mg total) by mouth 3 (three) times daily. Patient not taking: Reported on 05/24/2021 01/07/17   Deatra Canter, FNP  Probiotic Product (ALIGN) CHEW Chew by mouth.    [provider]  promethazine (PHENERGAN) 12.5 MG tablet Take 1 tablet (12.5 mg total) by mouth every 6 (six) hours as needed for nausea or vomiting. 08/18/21   Albertine Grates, MD    Blood pressure (!) 160/58, pulse 81, temperature 99.3 F (37.4 C), temperature source Oral, resp. rate  16, height 5\' 3"  (1.6 m), weight 82.1 kg, SpO2 91%. Physical Exam: General: pleasant  elderly female who is laying in bed in NAD HEENT: head is normocephalic, atraumatic.  Sclera are noninjected.  Pupils equal and round.  Ears and nose without any masses or lesions.  Mouth is pink and moist. Dentition fair Heart: regular, rate, and rhythm  Lungs: CTAB, no wheezes, rhonchi, or rales noted.  Respiratory effort nonlabored on room air Abd: soft, mild distension, bowel sounds present, mild generalized tenderness, moderate right sided TTP without rebound or guarding Skin: warm and dry with no masses, lesions, or rashes Psych: A&Ox4 with an appropriate affect Neuro: MAEs, no gross motor or sensory deficits BUE/BLE  Results for orders placed or performed during the hospital encounter of 07/05/23 (from the past 48 hour(s))  Lipase, blood     Status: Abnormal   Collection Time: 07/05/23  3:14 PM  Result Value Ref Range   Lipase <10 (L) 11 - 51 U/L    Comment: Performed at [facility], [address] [place], [place] [address]  Comprehensive metabolic panel     Status: Abnormal   Collection Time: 07/05/23  3:14 PM  Result Value Ref Range   Sodium 134 (L) 135 - 145 mmol/L   Potassium 4.1 3.5 - 5.1 mmol/L   Chloride 101 98 - 111 mmol/L   CO2 25 22 - 32 mmol/L   Glucose, Bld 120 (H) 70 - 99 mg/dL    Comment: Glucose reference range applies only to samples taken after fasting for at least 8 hours.   BUN 17 8 - 23 mg/dL   Creatinine, Ser 8.29 (H) 0.44 - 1.00 mg/dL   Calcium 9.9 8.9 - 56.2 mg/dL   Total Protein 7.5 6.5 - 8.1 g/dL   Albumin 3.9 3.5 - 5.0 g/dL   AST 17 15 - 41 U/L   ALT 9 0 - 44 U/L   Alkaline Phosphatase 90 38 - 126 U/L   Total Bilirubin 0.7 <1.2 mg/dL   GFR, Estimated 48 (L) >60 mL/min    Comment: (NOTE) Calculated using the CKD-EPI Creatinine Equation (2021)    Anion gap 8 5 - 15    Comment: Performed at [facility], [address], [place], [place] [address]  CBC     Status: Abnormal   Collection Time: 07/05/23  3:14  PM  Result Value Ref Range   WBC 18.4 (H) 4.0 - 10.5 K/uL   RBC 4.87 3.87 - 5.11 MIL/uL   Hemoglobin 15.0 12.0 - 15.0 g/dL   HCT 57.8 46.9 - 62.9 %   MCV 89.3 80.0 - 100.0 fL   MCH 30.8 26.0 - 34.0 pg   MCHC 34.5 30.0 - 36.0 g/dL   RDW 52.8 41.3 - 24.4 %   Platelets 325 150 - 400 K/uL   nRBC 0.0 0.0 - 0.2 %    Comment: Performed at [facility], [address] [place], [place] [address]  Urinalysis, Routine w reflex microscopic -Urine, Clean Catch     Status: Abnormal   Collection Time: 07/05/23  3:14 PM  Result Value Ref Range   Color, Urine YELLOW YELLOW   APPearance CLEAR CLEAR   Specific Gravity, Urine 1.044 (H) 1.005 - 1.030   pH 6.5 5.0 - 8.0   Glucose, UA NEGATIVE NEGATIVE mg/dL   Hgb urine dipstick NEGATIVE NEGATIVE   Bilirubin Urine NEGATIVE NEGATIVE   Ketones, ur NEGATIVE NEGATIVE mg/dL   Protein, ur TRACE (A) NEGATIVE mg/dL  Nitrite NEGATIVE NEGATIVE   Leukocytes,Ua NEGATIVE NEGATIVE    Comment: Performed at [facility], [address] [place], [place] [address]  Magnesium     Status: None   Collection Time: 07/06/23  6:27 AM  Result Value Ref Range   Magnesium 1.8 1.7 - 2.4 mg/dL    Comment: Performed at [facility], [address] [place], [place] [address]  Phosphorus     Status: None   Collection Time: 07/06/23  6:27 AM  Result Value Ref Range   Phosphorus 3.1 2.5 - 4.6 mg/dL    Comment: Performed at [facility], [address] [place], [place] [address]  Comprehensive metabolic panel     Status: Abnormal   Collection Time: 07/06/23  6:27 AM  Result Value Ref Range   Sodium 136 135 - 145 mmol/L   Potassium 3.8 3.5 - 5.1 mmol/L   Chloride 108 98 - 111 mmol/L   CO2 21 (L) 22 - 32 mmol/L   Glucose, Bld 134 (H) 70 - 99 mg/dL    Comment: Glucose reference range applies only to samples taken after fasting for at least 8 hours.   BUN 15 8 - 23 mg/dL   Creatinine, Ser 6.96 0.44 - 1.00 mg/dL   Calcium  8.4 (L) 8.9 - 10.3 mg/dL   Total Protein 5.6 (L) 6.5 - 8.1 g/dL   Albumin 2.6 (L) 3.5 - 5.0 g/dL   AST 14 (L) 15 - 41 U/L   ALT 11 0 - 44 U/L   Alkaline Phosphatase 80 38 - 126 U/L   Total Bilirubin 0.8 <1.2 mg/dL   GFR, Estimated >29 >52 mL/min    Comment: (NOTE) Calculated using the CKD-EPI Creatinine Equation (2021)    Anion gap 7 5 - 15    Comment: Performed at [facility], [address], [place], [place] [address]  CBC     Status: Abnormal   Collection Time: 07/06/23  6:27 AM  Result Value Ref Range   WBC 15.8 (H) 4.0 - 10.5 K/uL   RBC 4.41 3.87 - 5.11 MIL/uL   Hemoglobin 13.5 12.0 - 15.0 g/dL   HCT 44.0 10.2 - 72.5 %   MCV 90.0 80.0 - 100.0 fL   MCH 30.6 26.0 - 34.0 pg   MCHC 34.0 30.0 - 36.0 g/dL   RDW 36.6 44.0 - 34.7 %   Platelets 269 150 - 400 K/uL   nRBC 0.0 0.0 - 0.2 %    Comment: Performed at [facility], [address] [place], [place] [address]   CT ABDOMEN PELVIS W CONTRAST  Result Date: 07/05/2023 CLINICAL DATA:  Nonlocalized acute abdominal pain. EXAM: CT ABDOMEN AND PELVIS WITH CONTRAST TECHNIQUE: Multidetector CT imaging of the abdomen and pelvis was performed using the standard protocol following bolus administration of intravenous contrast. RADIATION DOSE REDUCTION: This exam was performed according to the departmental dose-optimization program which includes automated exposure control, adjustment of the mA and/or kV according to patient size and/or use of iterative reconstruction technique. CONTRAST:  80mL OMNIPAQUE IOHEXOL 300 MG/ML  SOLN COMPARISON:  03/15/2022. FINDINGS: Lower chest: No acute abnormality. Hepatobiliary: No focal liver abnormality is seen. No gallstones, gallbladder wall thickening, or biliary dilatation. Pancreas: Unremarkable. No pancreatic ductal dilatation or surrounding inflammatory changes. Spleen: Normal in size without focal abnormality. Adrenals/Urinary Tract: Normal appearance of the adrenal glands. No kidney mass,  nephrolithiasis or signs of obstructive uropathy. Stomach/Bowel: Moderate hiatal hernia. Stomach otherwise unremarkable. There  is no small bowel wall thickening, inflammation or distension. Atypical location of the cecum within the right upper quadrant of the abdomen. There is abnormal dilatation of the cecum and proximal colon up to the level of the mid transverse colon. The dilated colon measures up to 7.3 cm and there is mild wall thickening with surrounding soft tissue stranding, image 53/2. No pneumatosis. Abrupt transition to decreased caliber mid and distal transverse colon is identified at this level. At the transition point there is a desiccated stool ball measuring 5.6 by 4.8 cm. Underlying colonic stricture either benign or malignant is suspected at this level. Distal colonic diverticulosis noted without signs of acute diverticulitis. Vascular/Lymphatic: Aortic atherosclerosis. No aneurysm. No signs of abdominopelvic adenopathy. Reproductive: Status post hysterectomy. No adnexal masses. Other: No free fluid or fluid collections. No signs of pneumoperitoneum. Musculoskeletal: No acute or significant osseous findings. IMPRESSION: 1. Signs of proximal colonic obstruction with abnormal dilatation of the cecum and proximal colon up to the level of the mid transverse colon. The dilated proximal colon measures up to 7.3 cm and there is mild wall thickening with surrounding soft tissue stranding. Abrupt transition to decreased caliber mid and distal colon is identified at this level. At the transition point there is a desiccated stool ball measuring 5.6 by 4.8 cm. Underlying colonic stricture either benign or malignant is suspected at this level. Surgical consultation is advised. 2. Moderate hiatal hernia. 3. Distal colonic diverticulosis without signs of acute diverticulitis. 4.  Aortic Atherosclerosis (ICD10-I70.0). Electronically Signed   By: Signa Kell M.D.   On: 07/05/2023 19:14       Assessment/Plan Colonic obstruction - Patient with 1 week of abdominal pain and worsening constipation. CT scan is concerning for proximal colonic obstruction with stool ball at this level. Patient's abdomen is soft without peritonitis. No indication for acute surgical intervention. Will ask GI to see for possible endoscopic evaluation to help sort out the cause of her obstruction. Keep NPO for now. Hold eliquis. We will follow. Given patient's multiple medical issues listed below suspect she would be at higher risk for any surgical intervention. Recommend cardiology consult for preoperative risk stratification.   ID - none VTE - SCDs, hold eliquis - ok for dvt ppx or heparin gtt from surgical standpoint if no GI procedures planned today FEN - IVF, NPO Foley - none  Hx heart transplant 2009 at [facility], on on CellCept Prograf, followed by Dr. Dot Lanes Hx recurrent DVT on eliquis (last dose 11/7 in AM) CKD-IIIa CHF HTN GERD Hypothyroidism Anxiety/depression  I reviewed hospitalist notes, last 24 h vitals and pain scores, last 24 h labs and trends, and last 24 h imaging results.   Franne Forts, [facility] Surgery 07/06/2023, 8:43 AM Please see Amion for pager number during day hours 7:00am-4:30pm

## 2023-07-06 NOTE — Plan of Care (Signed)

## 2023-07-06 NOTE — Consult Note (Signed)
Cardiology Consultation   Patient ID: Patrica Spratt MRN: [id number]; DOB: 01/02/1941  Admit date: 07/05/2023 Date of Consult: 07/06/2023  PCP:  Cleatis Polka., MD   [facility] Providers Cardiologist:  None [facility]   Patient Profile:   Aronda Wiederholt is a 82 y.o. 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 Difficile, hyperlipidemia statin intolerant, who is being seen 07/06/2023 for the evaluation of preoperative evaluation at the request of general surgery.  History of Present Illness:   Ms. 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  She was last seen by them on 01/02/2023.  She is on mycophenolate and tacrolimus.  Her transplant course has been overall without any complications.  She has had no history of rejections her medications.  She chronically has had dyspnea/fatigue even prior to her transplant surgery without any clear etiologies.  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  During her workup for her dyspnea she had previous left heart catheterization and January 2019 that demonstrated normal coronary anatomy.  Also had previous PFTs that showed nonspecific pattern.  She had a ZIO monitor in 2021 that did not note any significant abnormalities and less than 1% ectopic beats.  Additionally, her echocardiograms have been with preserved EF with some valvular disease noted.  She also had a cardiac PET scan in December 2023 no significant abnormalities and preserved EF.  She had a perfusion defect that likely was attributed to lateral translation of the transplanted heart given she had preserved myocardial flow reserves and normal motion.  Currently patient has been admitted for abdominal pain and found to have large bowel obstruction.  CT scan showed proximal  colonic obstruction with abnormal dilatation of the cecum and proximal colon.  Currently there have been no plans for acute surgery.  The plan is to have GI see for endoscopic evaluation and possibly colonoscopy or colonoscopy.  Cardiology has been asked to see for preoperative restratification.  Overall patient seems to be doing very well from a cardiac standpoint.  She does note chronic dyspnea and some fatigue that she states has been unchanged for several years now.  Overall has had negative workup.  She ambulates independently without mechanical assistance and without any associated symptoms such as chest pain or significant shortness of breath.  Denies having issues with peripheral edema, dizziness, palpitations, syncope.  She is compliant with all of her medications.  She believes she could probably walk 1/2-3/4 of a football field.  She does not climb stairs.  Chest x-ray negative.  Normal renal function.  Mildly elevated AST.  WBC 15.8.  Hemoglobin 13.5.  No other significant lab or imaging findings.   Past Medical History:  Diagnosis Date   A-fib (HCC)    resolved   Anxiety    Arrhythmia    bradycardia   CHF (congestive heart failure) (HCC)    resolved   Depression    DVT (deep venous thrombosis) (HCC)    GERD (gastroesophageal reflux disease)    Heart transplanted [facility])    Followed at [facility] - Dr. Laurence Compton   HTN (hypertension)    Hyperparathyroidism [facility])    resolved   Osteoporosis    Pacemaker    resolved    Past Surgical History:  Procedure Laterality Date   ABDOMINAL HYSTERECTOMY  ABLATION  2007   ABLATION ON ENDOMETRIOSIS     APPENDECTOMY     BREAST EXCISIONAL BIOPSY Left 1986   benign   CARDIAC CATHETERIZATION  2009   HEART TRANSPLANT  2009   HEART TRANSPLANT     NECK SURGERY     removal of parathyroid   PACEMAKER INSERTION  2005   TOTAL ABDOMINAL HYSTERECTOMY W/ BILATERAL SALPINGOOPHORECTOMY       Inpatient Medications: Scheduled Meds:   diltiazem  120 mg Oral BID   fluticasone furoate-vilanterol  1 puff Inhalation Daily   levothyroxine  50 mcg Oral Q0600   mycophenolate  250 mg Oral BID   pantoprazole (PROTONIX) IV  40 mg Intravenous Q12H   tacrolimus  0.5 mg Oral BID   Continuous Infusions:  sodium chloride 75 mL/hr at 07/06/23 0928   PRN Meds: acetaminophen **OR** acetaminophen, albuterol, clonazePAM, morphine injection, ondansetron **OR** ondansetron (ZOFRAN) IV  Allergies:    Allergies  Allergen Reactions   Antihistamines, Chlorpheniramine-Type Other (See Comments)    Numbness   Augmentin [Amoxicillin-Pot Clavulanate]     Nausea only   Cyclobenzaprine Other (See Comments)   Fentanyl Other (See Comments)    Other reaction(s): Confusion (intolerance)   Metronidazole     Unknown reaction    Valtrex [Valacyclovir]     "Made my chin numb"   Codeine Nausea Only    Social History:   Social History   Socioeconomic History   Marital status: Married    Spouse name: Not on file   Number of children: 4   Years of education: [employer]   Highest education level: Not on file  Occupational History   Occupation: Retired   Tobacco Use   Smoking status: Former   Smokeless tobacco: Never   Tobacco comments:    Quit 1960  Substance and Sexual Activity   Alcohol use: No    Alcohol/week: 0.0 standard drinks of alcohol   Drug use: No   Sexual activity: Not on file  Other Topics Concern   Not on file  Social History Narrative   3 caffeine drinks a day    Social Determinants of Health   Financial Resource Strain: Not on file  Food Insecurity: No Food Insecurity (07/05/2023)   Hunger Vital Sign    Worried About Running Out of Food in the Last Year: Never true    Ran Out of Food in the Last Year: Never true  Transportation Needs: No Transportation Needs (07/05/2023)   PRAPARE - Administrator, Civil Service (Medical): No    Lack of Transportation (Non-Medical): No  Physical Activity: Not on file   Stress: Not on file  Social Connections: Not on file  Intimate Partner Violence: Not At Risk (07/05/2023)   Humiliation, Afraid, Rape, and Kick questionnaire    Fear of Current or Ex-Partner: No    Emotionally Abused: No    Physically Abused: No    Sexually Abused: No    Family History:   Family History  Problem Relation Age of Onset   Heart attack Mother    Heart disease Mother    Colon cancer Father    Ulcers Father    Liver cancer Father    Colon polyps Sister    Heart attack Maternal Grandmother    Heart attack Paternal Grandmother    Diabetes type II Paternal Grandmother    OCD Daughter      ROS:  Please see the history of present illness. All other ROS reviewed and  negative.     Physical Exam/Data:   Vitals:   07/05/23 2153 07/05/23 2237 07/06/23 0415 07/06/23 0714  BP: (!) 164/67 (!) 160/68 (!) 161/54 (!) 160/58  Pulse: 85 84 83 81  Resp:  20 16 16   Temp:  98.6 F (37 C) 98.1 F (36.7 C) 99.3 F (37.4 C)  TempSrc:  Oral Oral Oral  SpO2: 93% 92% 91% 91%  Weight:      Height:        Intake/Output Summary (Last 24 hours) at 07/06/2023 1354 Last data filed at 07/06/2023 0655 Gross per 24 hour  Intake 800.2 ml  Output --  Net 800.2 ml      07/05/2023    3:08 PM 02/16/2023   11:39 AM 10/06/2021    1:09 PM  Last 3 Weights  Weight (lbs) 181 lb 181 lb 170 lb  Weight (kg) 82.1 kg 82.101 kg 77.111 kg     Body mass index is 32.06 kg/m.  General:  Well nourished, well developed, in no acute distress HEENT: normal Neck: no JVD Vascular: No carotid bruits; Distal pulses 2+ bilaterally Cardiac:  normal S1, S2; RRR; no murmur.  Lungs:  clear to auscultation bilaterally, no wheezing, rhonchi or rales  Abd: soft, nontender, no hepatomegaly  Ext: no edema Musculoskeletal:  No deformities, BUE and BLE strength normal and equal Skin: warm and dry  Neuro:  CNs 2-12 intact, no focal abnormalities noted Psych:  Normal affect   EKG:  The EKG was personally reviewed  and demonstrates: Normal sinus rhythm, heart rate 83.  Normal axis.  Nonspecific T wave changes.  No significant changes from prior readings. Telemetry:  Telemetry was personally reviewed and demonstrates: Normal sinus rhythm.  Heart rates in the 70s to 80s.  Relevant CV Studies: Cardiac PET scan 08/10/2022 1.  Medium sized, moderate intensity, fixed perfusion defect involving the apical lateral wall, mid inferolateral wall, mid anterolateral walls of the LV in the territory of the distal left circumflex.  In the context of preserved regional myocardial flow reserve and normal wall motion, this may be artifactual and related to lateral translation of the transplanted heart  52.  2.  Normal LV systolic function  53.  3.  Global myocardial flow reserve is preserved  54.  4.  Other Ancillary CT findings as above.   Echocardiogram 08/10/2022 he left ventricular size is normal with mild concentric hypertrophy. LV ejection fraction = 55-60%. LV apex is not well visualized. The right ventricle is normal in size and function. The left atrium is moderately dilated. Right atrial size is normal. There is moderate central aortic insufficiency There is aortic valve sclerosis. There is moderate mitral regurgitation that appears to be due to mild bileaflet prolapse and atrial annular dilation. There was insufficient TR detected to calculate RV systolic pressure. IVC size was normal. There is no pericardial effusion. Compared to the previous study, mitral regurgitation has worsened. Aortic regurgitation is similar   Heart catheterization 09/03/2017 Coronary Findings Diagnostic Dominance: Right  Left Anterior Descending: The vessel is angiographically normal.  Left Circumflex: The vessel is angiographically normal.  Right Coronary Artery: The vessel is angiographically normal.   Intervention  No interventions have been documented.   Laboratory Data:  High Sensitivity Troponin:  No results for  input(s): "TROPONINIHS" in the last 720 hours.   Chemistry Recent Labs  Lab 07/05/23 1514 07/06/23 0627  NA 134* 136  K 4.1 3.8  CL 101 108  CO2 25 21*  GLUCOSE 120*  134*  BUN 17 15  CREATININE 1.13* 0.93  CALCIUM 9.9 8.4*  MG  --  1.8  GFRNONAA 48* >60  ANIONGAP 8 7    Recent Labs  Lab 07/05/23 1514 07/06/23 0627  PROT 7.5 5.6*  ALBUMIN 3.9 2.6*  AST 17 14*  ALT 9 11  ALKPHOS 90 80  BILITOT 0.7 0.8   Lipids No results for input(s): "CHOL", "TRIG", "HDL", "LABVLDL", "LDLCALC", "CHOLHDL" in the last 168 hours.  Hematology Recent Labs  Lab 07/05/23 1514 07/06/23 0627  WBC 18.4* 15.8*  RBC 4.87 4.41  HGB 15.0 13.5  HCT 43.5 39.7  MCV 89.3 90.0  MCH 30.8 30.6  MCHC 34.5 34.0  RDW 13.2 13.2  PLT 325 269   Thyroid No results for input(s): "TSH", "FREET4" in the last 168 hours.  BNPNo results for input(s): "BNP", "PROBNP" in the last 168 hours.  DDimer No results for input(s): "DDIMER" in the last 168 hours.   Radiology/Studies:  DG CHEST PORT 1 VIEW  Result Date: 07/06/2023 CLINICAL DATA:  Pre-op cardiovascular examination. EXAM: PORTABLE CHEST 1 VIEW COMPARISON:  02/16/2023 FINDINGS: Chronic elevation of the right hemidiaphragm. No evidence for focal airspace disease and no overt pulmonary edema. Heart size is within normal limits and grossly stable. Median sternotomy wires are present. Atherosclerotic calcifications at the aortic arch. IMPRESSION: 1. No acute cardiopulmonary disease. 2. Chronic elevation of the right hemidiaphragm. Electronically Signed   By: Richarda Overlie M.D.   On: 07/06/2023 08:42   CT ABDOMEN PELVIS W CONTRAST  Result Date: 07/05/2023 CLINICAL DATA:  Nonlocalized acute abdominal pain. EXAM: CT ABDOMEN AND PELVIS WITH CONTRAST TECHNIQUE: Multidetector CT imaging of the abdomen and pelvis was performed using the standard protocol following bolus administration of intravenous contrast. RADIATION DOSE REDUCTION: This exam was performed according to  the departmental dose-optimization program which includes automated exposure control, adjustment of the mA and/or kV according to patient size and/or use of iterative reconstruction technique. CONTRAST:  80mL OMNIPAQUE IOHEXOL 300 MG/ML  SOLN COMPARISON:  03/15/2022. FINDINGS: Lower chest: No acute abnormality. Hepatobiliary: No focal liver abnormality is seen. No gallstones, gallbladder wall thickening, or biliary dilatation. Pancreas: Unremarkable. No pancreatic ductal dilatation or surrounding inflammatory changes. Spleen: Normal in size without focal abnormality. Adrenals/Urinary Tract: Normal appearance of the adrenal glands. No kidney mass, nephrolithiasis or signs of obstructive uropathy. Stomach/Bowel: Moderate hiatal hernia. Stomach otherwise unremarkable. There is no small bowel wall thickening, inflammation or distension. Atypical location of the cecum within the right upper quadrant of the abdomen. There is abnormal dilatation of the cecum and proximal colon up to the level of the mid transverse colon. The dilated colon measures up to 7.3 cm and there is mild wall thickening with surrounding soft tissue stranding, image 53/2. No pneumatosis. Abrupt transition to decreased caliber mid and distal transverse colon is identified at this level. At the transition point there is a desiccated stool ball measuring 5.6 by 4.8 cm. Underlying colonic stricture either benign or malignant is suspected at this level. Distal colonic diverticulosis noted without signs of acute diverticulitis. Vascular/Lymphatic: Aortic atherosclerosis. No aneurysm. No signs of abdominopelvic adenopathy. Reproductive: Status post hysterectomy. No adnexal masses. Other: No free fluid or fluid collections. No signs of pneumoperitoneum. Musculoskeletal: No acute or significant osseous findings. IMPRESSION: 1. Signs of proximal colonic obstruction with abnormal dilatation of the cecum and proximal colon up to the level of the mid transverse  colon. The dilated proximal colon measures up to 7.3 cm and there is  mild wall thickening with surrounding soft tissue stranding. Abrupt transition to decreased caliber mid and distal colon is identified at this level. At the transition point there is a desiccated stool ball measuring 5.6 by 4.8 cm. Underlying colonic stricture either benign or malignant is suspected at this level. Surgical consultation is advised. 2. Moderate hiatal hernia. 3. Distal colonic diverticulosis without signs of acute diverticulitis. 4.  Aortic Atherosclerosis (ICD10-I70.0). Electronically Signed   By: Signa Kell M.D.   On: 07/05/2023 19:14     Assessment and Plan:   Preoperative evaluation for LBO obstruction Overall I feel she is to be at a mild-moderate risk for possible procedure. Can complete than 3-4 METS.  RCRI 6% for MACE. History of open heart transplant in 2009 without any significant postoperative course.  She has no signs of heart failure and had cardiac PET scan without any significant abnormalities (had a perfusion defect felt to be related to artifact transplant heart) and preserved EF.  Previous heart catheterization in January 2019 did not note any CAD.  She did have an echocardiogram in December 2023 that noted moderate aortic insufficiency and moderate MR.  They had plans to repeat this in October.   I think it is reasonable to obtain echocardiogram now prior to any possible surgeries to assess valvular disease.  However per notes it appears that there is no indications for acute surgery.  GI has been asked to evaluate evaluate for other secondary causes with possible endoscopy or colonoscopy.  Will discuss with MD about any other recommendations. Check echocardiogram, previous EF 55 to 60% No history of rejections.  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  Had nonspecific pattern on  previous PFT.  Recurrent DVT On Eliquis 5 mg twice daily.  Currently holding per primary team.  Resume when safe to do so.  Hypertension Currently on diltiazem 120 mg.  PTA losartan 100mg  has been held. Risk Assessment/Risk Scores:   For questions or updates, please contact [facility] Please consult [email or web address] for contact info under    Signed, Abagail Kitchens, PA-C  07/06/2023 1:54 PM

## 2023-07-06 NOTE — Plan of Care (Signed)

## 2023-07-06 NOTE — Progress Notes (Addendum)
Initial Nutrition Assessment  DOCUMENTATION CODES:   Not applicable  INTERVENTION:  - Diet advancement per MD  - Provide Boost Breeze TID, each supplement provides 250 kcal and 9 grams of protein  NUTRITION DIAGNOSIS:   Inadequate oral intake related to other (see comment) (resticted diet) as evidenced by other (comment) (being on clear liquids).  GOAL:   Patient will meet greater than or equal to 90% of their needs  MONITOR:   Labs, I & O's, Diet advancement  REASON FOR ASSESSMENT:   Consult Assessment of nutrition requirement/status  ASSESSMENT:   PMH of DVT, HTN, hypothyroidism, history of heart [facility], OSA,  parathyroidectomy. Present 11/7 for abdominal pain, found to have large bowel obstruction.  11/8: CT scan - proximal colonic obstruction with stool ball  CT scan found to have stool ball in colon, but no need for surgical intervention at this time. Possible endoscopic evaluation from GI to confirm.   Pt reports not having anything to eat since her abdominal pain started 3 days prior to admission. She denies recent wt loss and states she probably has gained weight. She reports eating 3 meals/day and had a good appetite before her abdominal pain started. Pt lives in an independent living center.   Pt states last bowel movement was Monday (11/4).    Pt just advanced to clears, will add Boost Breeze BID to supplement intake.   Admit weight: 82.1 kg  Current weight: 82.1 kg    Nutritionally Relevant Medications: Scheduled Meds:  levothyroxine  50 mcg Oral Q0600   mycophenolate  250 mg Oral BID   pantoprazole (PROTONIX) IV  40 mg Intravenous Q12H   tacrolimus  0.5 mg Oral BID   Labs Reviewed: Calcium 8.4  NUTRITION - FOCUSED PHYSICAL EXAM:  Flowsheet Row Most Recent Value  Orbital Region No depletion  Upper Arm Region No depletion  Thoracic and Lumbar Region No depletion  Buccal Region No depletion  Temple Region No  depletion  Clavicle Bone Region Mild depletion  Clavicle and Acromion Bone Region Mild depletion  Scapular Bone Region No depletion  Dorsal Hand No depletion  Patellar Region No depletion  Anterior Thigh Region No depletion  Posterior Calf Region No depletion  Edema (RD Assessment) None  Hair Reviewed  Eyes Reviewed  Mouth Reviewed  Skin Reviewed  Nails Reviewed       Diet Order:   Diet Order             Diet clear liquid Room service appropriate? Yes; Fluid consistency: Thin  Diet effective now                   EDUCATION NEEDS:   Education needs have been addressed  Skin:  Skin Assessment: Reviewed RN Assessment  Last BM:  11/4  Height:   Ht Readings from Last 1 Encounters:  07/05/23 5\' 3"  (1.6 m)    Weight:   Wt Readings from Last 1 Encounters:  07/05/23 82.1 kg    Ideal Body Weight:  51.8 kg  BMI:  Body mass index is 32.06 kg/m.  Estimated Nutritional Needs:   Kcal:  1600-1800  Protein:  80-100g  Fluid:  >1.6L  Elliot Dally, RD Registered Dietitian  See Amion for more information

## 2023-07-07 ENCOUNTER — Other Ambulatory Visit (HOSPITAL_COMMUNITY): Payer: Medicare Other

## 2023-07-07 ENCOUNTER — Inpatient Hospital Stay (HOSPITAL_COMMUNITY): Payer: Medicare Other

## 2023-07-07 DIAGNOSIS — Z01810 Encounter for preprocedural cardiovascular examination: Secondary | ICD-10-CM | POA: Diagnosis not present

## 2023-07-07 DIAGNOSIS — K56609 Unspecified intestinal obstruction, unspecified as to partial versus complete obstruction: Secondary | ICD-10-CM | POA: Diagnosis not present

## 2023-07-07 LAB — BASIC METABOLIC PANEL
Anion gap: 9 (ref 5–15)
BUN: 12 mg/dL (ref 8–23)
CO2: 23 mmol/L (ref 22–32)
Calcium: 8.8 mg/dL — ABNORMAL LOW (ref 8.9–10.3)
Chloride: 104 mmol/L (ref 98–111)
Creatinine, Ser: 0.96 mg/dL (ref 0.44–1.00)
GFR, Estimated: 58 mL/min — ABNORMAL LOW (ref >60)
Glucose, Bld: 143 mg/dL — ABNORMAL HIGH (ref 70–99)
Potassium: 4.2 mmol/L (ref 3.5–5.1)
Sodium: 136 mmol/L (ref 135–145)

## 2023-07-07 LAB — ECHOCARDIOGRAM COMPLETE
AR max vel: 1.52 cm2
AV Peak grad: 17.6 mm[Hg]
Ao pk vel: 2.1 m/s
Area-P 1/2: 5.54 cm2
Height: 63 in
P 1/2 time: 276 ms
S' Lateral: 3 cm
Weight: 2895.96 [oz_av]

## 2023-07-07 LAB — CBC
HCT: 42.4 % (ref 36.0–46.0)
Hemoglobin: 14.3 g/dL (ref 12.0–15.0)
MCH: 30.6 pg (ref 26.0–34.0)
MCHC: 33.7 g/dL (ref 30.0–36.0)
MCV: 90.8 fL (ref 80.0–100.0)
Platelets: 305 10*3/uL (ref 150–400)
RBC: 4.67 MIL/uL (ref 3.87–5.11)
RDW: 13.2 % (ref 11.5–15.5)
WBC: 17.3 10*3/uL — ABNORMAL HIGH (ref 4.0–10.5)
nRBC: 0 % (ref 0.0–0.2)

## 2023-07-07 LAB — MAGNESIUM: Magnesium: 1.7 mg/dL (ref 1.7–2.4)

## 2023-07-07 MED ORDER — HYDROMORPHONE HCL 1 MG/ML IJ SOLN
1.0000 mg | INTRAMUSCULAR | Status: DC | PRN
  Administered 2023-07-07 – 2023-07-12 (×11): 1 mg via INTRAVENOUS
  Filled 2023-07-07 (×12): qty 1

## 2023-07-07 MED ORDER — SORBITOL 70 % SOLN
960.0000 mL | TOPICAL_OIL | Freq: Once | ORAL | Status: AC
  Administered 2023-07-07: 960 mL via RECTAL
  Filled 2023-07-07: qty 240

## 2023-07-07 MED ORDER — MAGNESIUM SULFATE 2 GM/50ML IV SOLN
2.0000 g | Freq: Once | INTRAVENOUS | Status: AC
  Administered 2023-07-07: 2 g via INTRAVENOUS
  Filled 2023-07-07: qty 50

## 2023-07-07 MED ORDER — HYDROMORPHONE HCL 1 MG/ML IJ SOLN
1.0000 mg | INTRAMUSCULAR | Status: DC | PRN
  Administered 2023-07-07: 1 mg via INTRAVENOUS
  Filled 2023-07-07: qty 1

## 2023-07-07 MED ORDER — OXYCODONE HCL 5 MG PO TABS: 5.0000 mg | ORAL_TABLET | ORAL | Status: DC | PRN

## 2023-07-07 MED ORDER — SODIUM CHLORIDE 0.9 % IV SOLN: INTRAVENOUS | Status: DC

## 2023-07-07 MED ORDER — SODIUM CHLORIDE 0.9 % IV SOLN: INTRAVENOUS | Status: AC

## 2023-07-07 NOTE — Plan of Care (Signed)
  Problem: Education: Goal: Knowledge of General Education information will improve Description: Including pain rating scale, medication(s)/side effects and non-pharmacologic comfort measures 07/07/2023 0700 by Aldean Baker, RN Outcome: Progressing 07/07/2023 0700 by Aldean Baker, RN Outcome: Progressing   Problem: Health Behavior/Discharge Planning: Goal: Ability to manage health-related needs will improve 07/07/2023 0700 by Frances Furbish T, RN Outcome: Progressing 07/07/2023 0700 by Frances Furbish T, RN Outcome: Progressing   Problem: Clinical Measurements: Goal: Ability to maintain clinical measurements within normal limits will improve 07/07/2023 0700 by Frances Furbish T, RN Outcome: Progressing 07/07/2023 0700 by Frances Furbish T, RN Outcome: Progressing Goal: Will remain free from infection 07/07/2023 0700 by Frances Furbish T, RN Outcome: Progressing 07/07/2023 0700 by Frances Furbish T, RN Outcome: Progressing Goal: Diagnostic test results will improve 07/07/2023 0700 by Aldean Baker, RN Outcome: Progressing 07/07/2023 0700 by Frances Furbish T, RN Outcome: Progressing Goal: Respiratory complications will improve 07/07/2023 0700 by Aldean Baker, RN Outcome: Progressing 07/07/2023 0700 by Frances Furbish T, RN Outcome: Progressing Goal: Cardiovascular complication will be avoided 07/07/2023 0700 by Aldean Baker, RN Outcome: Progressing 07/07/2023 0700 by Aldean Baker, RN Outcome: Progressing   Problem: Activity: Goal: Risk for activity intolerance will decrease 07/07/2023 0700 by Frances Furbish T, RN Outcome: Progressing 07/07/2023 0700 by Frances Furbish T, RN Outcome: Progressing   Problem: Nutrition: Goal: Adequate nutrition will be maintained 07/07/2023 0700 by Aldean Baker, RN Outcome: Progressing 07/07/2023 0700 by Frances Furbish T, RN Outcome: Progressing   Problem:  Coping: Goal: Level of anxiety will decrease 07/07/2023 0700 by Aldean Baker, RN Outcome: Progressing 07/07/2023 0700 by Frances Furbish T, RN Outcome: Progressing   Problem: Elimination: Goal: Will not experience complications related to bowel motility 07/07/2023 0700 by Frances Furbish T, RN Outcome: Progressing 07/07/2023 0700 by Frances Furbish T, RN Outcome: Progressing Goal: Will not experience complications related to urinary retention 07/07/2023 0700 by Aldean Baker, RN Outcome: Progressing 07/07/2023 0700 by Frances Furbish T, RN Outcome: Progressing   Problem: Pain Management: Goal: General experience of comfort will improve 07/07/2023 0700 by Aldean Baker, RN Outcome: Progressing 07/07/2023 0700 by Frances Furbish T, RN Outcome: Progressing   Problem: Safety: Goal: Ability to remain free from injury will improve 07/07/2023 0700 by Frances Furbish T, RN Outcome: Progressing 07/07/2023 0700 by Frances Furbish T, RN Outcome: Progressing   Problem: Skin Integrity: Goal: Risk for impaired skin integrity will decrease 07/07/2023 0700 by Frances Furbish T, RN Outcome: Progressing 07/07/2023 0700 by Aldean Baker, RN Outcome: Progressing

## 2023-07-07 NOTE — Progress Notes (Signed)
   Consult note reviewed- patient with prior OHTx. Has done well and followed at [facility] for this. Felt to be ok to proceed with surgery as necessary. Will follow peripherally and be available as needed, but continue home regimen including immunosuppressants.  Chrystie Nose, MD, [facility], FACP  [place]  [facility]  Medical Director of the [facility]  Cardiovascular Risk Reduction [facility] Diplomate of the American Board of Clinical Lipidology Attending Cardiologist  Direct Dial: [phone]  Fax: [phone]  Website:  [email or web address]

## 2023-07-07 NOTE — Progress Notes (Signed)
PROGRESS NOTE    Burdell Frieling  ZOX:[id number] DOB: 1940-12-10 DOA: 07/05/2023 PCP: Cleatis Polka., MD    Brief Narrative:   [name] [name] is a 82 y.o. female with past medical history significant for HTN, hypothyroidism, history of heart transplant followed by [facility] since 2009, history of parathyroidectomy, OSA not on CPAP, history of DVT on Eliquis, history of C. difficile colitis who presented to [facility] ED on 11/7 by direction of her PCP with 3-day history of progressive abdominal pain associated with nausea and obstipation with concerns of bowel obstruction.  In the ED, temperature 98.2 F, HR 95, RR 16, BP 156/67, SpO2 95% on room air.  WBC 18.4, hemoglobin 15.0, platelet count 325.  Sodium 134, potassium 4.1, chloride 101, CO2 25, glucose 120, BUN 17, creatinine 1.13.  Lipase less than 10.  AST 17, ALT 9, total bilirubin 0.7.  Urinalysis unrevealing.  Chest x-ray with no acute cardiopulmonary disease process.  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  General surgery consulted.  Patient was started on IV fluids and kept NPO.  TRH consulted for admission and patient was transferred to [facility] for further evaluation and management.  Assessment & Plan:   Large bowel obstruction Patient presenting to the ED by discretion of her PCP after findings/concern of bowel obstruction noted on outpatient x-ray.  Patient  reports progressive abdominal pain associate with nausea and obstipation over the last 3 days.  Patient was afebrile with elevated WBC count of 18.4.  CT abdomen/pelvis with signs of proximal colonic obstruction with abnormal dilation of the cecum/proximal colon up to the level of the mid transverse colon, dilated proximal colon measuring up to 7.3 cm with wall thickening/surrounding soft tissue stranding with abrupt transition mid/distal colon with desiccated stool ball at transition point measuring 5.6 x 4.8 cm with underlying colonic stricture either benign/malignant suspected.  Patient was seen by general surgery, recommended GI consultation for consideration of colonoscopy. -- General Surgery/[facility] following, appreciate assistance -- N.p.o. -- NS at 75 mL/h -- SMOG enema today -- Awaiting further recommendations from GI/general surgery.  Leukocytosis WBC count elevated 18.4 on admission, likely reactive versus hemoconcentration in the setting of poor oral intake in the days preceding hospitalization due to large bowel obstruction. -- WBC 18.4>15.8 -- Continue IV fluid as above -- CBC daily  Essential hypertension Home regimen includes diltiazem 120 mg p.o. twice daily, losartan 100 mg p.o. twice daily, -- Diltiazem 120 mg p.o. twice daily -- Hold home losartan for now  Hypothyroidism -- Levothyroxine 50 mcg p.o. daily  History of heart transplant Follows with [facility] since 2009.  Seen by [facility] cardiology while inpatient. -- Mycophenolate 250 mg p.o. twice daily -- Tacrolimus 0.5 mg p.o. twice daily  History of DVT -- Holding home Xarelto  Anxiety -- Clonazepam 0.5 m p.o. nightly as needed anxiety  GERD On omeprazole outpatient. -- Continue Protonix 40 mg IV every 12 hours while inpatient  Class I obesity Body mass index is 32.06 kg/m.  Complicates all facets of care.  DVT prophylaxis: SCDs Start: 07/05/23 2338    Code Status: Full Code Family  Communication: No family present bedside this morning  Disposition Plan:  Level of care: Telemetry Medical Status is: Inpatient Remains inpatient appropriate because: Pending further recommendations from GI/general surgery, anticipate likely need of colonoscopy    Consultants:  General surgery [facility] gastroenterology Cardiology -signed off 11/9  Procedures:  TTE  Antimicrobials:  None   Subjective: Patient seen examined bedside, resting calmly.  Lying in bed.  Echocardiogram currently being performed.  Intolerant to MiraLAX is causing nausea.  Refuses any further MiraLAX at this time.  Requesting enema.  Continues with abdominal distention and pain.  Denies flatus, no bowel movement.   No other specific complaints or concerns at this time.  Denies headache, no visual changes, no chest pain, no palpitations, no shortness of breath, no fever/chills/night sweats, no nausea/vomiting, no cough/congestion, no focal weakness, no fatigue, no paresthesias.  No acute events overnight per nursing staff.  Objective: Vitals:   07/06/23 2258 07/07/23 0000 07/07/23 0429 07/07/23 0721  BP: (!) 171/70 (!) 171/66 (!) 156/60 (!) 143/55  Pulse: 91 94 92 87  Resp: 20 20 18 16   Temp:  98.2 F (36.8 C) 98.7 F (37.1 C) 98.6 F (37 C)  TempSrc:  Oral Oral Oral  SpO2: 96% 94% 94% 94%  Weight:      Height:        Intake/Output Summary (Last 24 hours) at 07/07/2023 1236 Last data filed at 07/07/2023 0701 Gross per 24 hour  Intake 1348.01 ml  Output --  Net 1348.01 ml   Filed Weights   07/05/23 1508  Weight: 82.1 kg    Examination:  Physical Exam: GEN: NAD, alert and oriented x 3, obese HEENT: NCAT, PERRL, EOMI, sclera clear, MMM PULM: CTAB w/o wheezes/crackles, normal respiratory effort, on room air CV: RRR w/o M/G/R GI: abd soft, + distention, no appreciable bowel sounds, no R/G/M MSK: no peripheral edema, muscle strength globally intact 5/5 bilateral upper/lower extremities NEURO: CN  II-XII intact, no focal deficits, sensation to light touch intact PSYCH: normal mood/affect Integumentary: dry/intact, no rashes or wounds    Data Reviewed: I have personally reviewed following labs and imaging studies  CBC: Recent Labs  Lab 07/05/23 1514 07/06/23 0627 07/07/23 0557  WBC 18.4* 15.8* 17.3*  HGB 15.0 13.5 14.3  HCT 43.5 39.7 42.4  MCV 89.3 90.0 90.8  PLT 325 269 305   Basic Metabolic Panel: Recent Labs  Lab 07/05/23 1514 07/06/23 0627 07/07/23 0557  NA 134* 136 136  K 4.1 3.8 4.2  CL 101 108 104  CO2 25 21* 23  GLUCOSE 120* 134* 143*  BUN 17 15 12   CREATININE 1.13* 0.93 0.96  CALCIUM 9.9 8.4* 8.8*  MG  --  1.8 1.7  PHOS  --  3.1  --    GFR: Estimated Creatinine Clearance: 44.3 mL/min (by C-G formula based on SCr of 0.96 mg/dL). Liver Function Tests: Recent Labs  Lab 07/05/23 1514 07/06/23 0627  AST 17 14*  ALT 9 11  ALKPHOS 90 80  BILITOT 0.7 0.8  PROT 7.5 5.6*  ALBUMIN 3.9 2.6*   Recent Labs  Lab 07/05/23 1514  LIPASE <10*   No results for input(s): "AMMONIA" in the last 168 hours. Coagulation Profile: No results for input(s): "INR", "PROTIME" in the last 168 hours. Cardiac Enzymes: No results for input(s): "CKTOTAL", "CKMB", "CKMBINDEX", "TROPONINI" in the last 168 hours. BNP (last 3 results) No results for input(s): "PROBNP"  in the last 8760 hours. HbA1C: No results for input(s): "HGBA1C" in the last 72 hours. CBG: No results for input(s): "GLUCAP" in the last 168 hours. Lipid Profile: No results for input(s): "CHOL", "HDL", "LDLCALC", "TRIG", "CHOLHDL", "LDLDIRECT" in the last 72 hours. Thyroid Function Tests: No results for input(s): "TSH", "T4TOTAL", "FREET4", "T3FREE", "THYROIDAB" in the last 72 hours. Anemia Panel: No results for input(s): "VITAMINB12", "FOLATE", "FERRITIN", "TIBC", "IRON", "RETICCTPCT" in the last 72 hours. Sepsis Labs: No results for input(s): "PROCALCITON", "LATICACIDVEN" in the last 168 hours.  No  results found for this or any previous visit (from the past 240 hour(s)).       Radiology Studies: DG CHEST PORT 1 VIEW  Result Date: 07/06/2023 CLINICAL DATA:  Pre-op cardiovascular examination. EXAM: PORTABLE CHEST 1 VIEW COMPARISON:  02/16/2023 FINDINGS: Chronic elevation of the right hemidiaphragm. No evidence for focal airspace disease and no overt pulmonary edema. Heart size is within normal limits and grossly stable. Median sternotomy wires are present. Atherosclerotic calcifications at the aortic arch. IMPRESSION: 1. No acute cardiopulmonary disease. 2. Chronic elevation of the right hemidiaphragm. Electronically Signed   By: Richarda Overlie M.D.   On: 07/06/2023 08:42   CT ABDOMEN PELVIS W CONTRAST  Result Date: 07/05/2023 CLINICAL DATA:  Nonlocalized acute abdominal pain. EXAM: CT ABDOMEN AND PELVIS WITH CONTRAST TECHNIQUE: Multidetector CT imaging of the abdomen and pelvis was performed using the standard protocol following bolus administration of intravenous contrast. RADIATION DOSE REDUCTION: This exam was performed according to the departmental dose-optimization program which includes automated exposure control, adjustment of the mA and/or kV according to patient size and/or use of iterative reconstruction technique. CONTRAST:  80mL OMNIPAQUE IOHEXOL 300 MG/ML  SOLN COMPARISON:  03/15/2022. FINDINGS: Lower chest: No acute abnormality. Hepatobiliary: No focal liver abnormality is seen. No gallstones, gallbladder wall thickening, or biliary dilatation. Pancreas: Unremarkable. No pancreatic ductal dilatation or surrounding inflammatory changes. Spleen: Normal in size without focal abnormality. Adrenals/Urinary Tract: Normal appearance of the adrenal glands. No kidney mass, nephrolithiasis or signs of obstructive uropathy. Stomach/Bowel: Moderate hiatal hernia. Stomach otherwise unremarkable. There is no small bowel wall thickening, inflammation or distension. Atypical location of the cecum within  the right upper quadrant of the abdomen. There is abnormal dilatation of the cecum and proximal colon up to the level of the mid transverse colon. The dilated colon measures up to 7.3 cm and there is mild wall thickening with surrounding soft tissue stranding, image 53/2. No pneumatosis. Abrupt transition to decreased caliber mid and distal transverse colon is identified at this level. At the transition point there is a desiccated stool ball measuring 5.6 by 4.8 cm. Underlying colonic stricture either benign or malignant is suspected at this level. Distal colonic diverticulosis noted without signs of acute diverticulitis. Vascular/Lymphatic: Aortic atherosclerosis. No aneurysm. No signs of abdominopelvic adenopathy. Reproductive: Status post hysterectomy. No adnexal masses. Other: No free fluid or fluid collections. No signs of pneumoperitoneum. Musculoskeletal: No acute or significant osseous findings. IMPRESSION: 1. Signs of proximal colonic obstruction with abnormal dilatation of the cecum and proximal colon up to the level of the mid transverse colon. The dilated proximal colon measures up to 7.3 cm and there is mild wall thickening with surrounding soft tissue stranding. Abrupt transition to decreased caliber mid and distal colon is identified at this level. At the transition point there is a desiccated stool ball measuring 5.6 by 4.8 cm. Underlying colonic stricture either benign or malignant is suspected at this level. Surgical consultation is advised. 2.  Moderate hiatal hernia. 3. Distal colonic diverticulosis without signs of acute diverticulitis. 4.  Aortic Atherosclerosis (ICD10-I70.0). Electronically Signed   By: Signa Kell M.D.   On: 07/05/2023 19:14        Scheduled Meds:  diltiazem  120 mg Oral BID   fluticasone furoate-vilanterol  1 puff Inhalation Daily   levothyroxine  50 mcg Oral Q0600   losartan  50 mg Oral Daily   mycophenolate  250 mg Oral BID   pantoprazole (PROTONIX) IV  40 mg  Intravenous Q12H   polyethylene glycol  17 g Oral Q4H   sorbitol, milk of mag, mineral oil, glycerin (SMOG) enema  960 mL Rectal Once   tacrolimus  0.5 mg Oral BID   Continuous Infusions:  sodium chloride 75 mL/hr at 07/07/23 0839     LOS: 2 days    Time spent: 53 minutes spent on chart review, discussion with nursing staff, consultants, updating family and interview/physical exam; more than 50% of that time was spent in counseling and/or coordination of care.    Alvira Philips [place], DO [facility] Available via Epic secure chat 7am-7pm After these hours, please refer to coverage provider listed on [email or web address] 07/07/2023, 12:36 PM

## 2023-07-07 NOTE — Progress Notes (Signed)
[facility] Progress Note  SUBJECTIVE:   Interval history: [name] [name] was seen and evaluated today at bedside. Resting comfortably in bed. IM and General Surgery notes appreciated. Awaiting final read on abdominal xray. Pending SMOG enema per IM.   She noted having abdominal pain today, worse in right abdomen. She has nausea, no vomiting. She was unable to tolerate Miralax. She has not had flatus nor bowel movement. She would not be able to tolerate a bowel preparation for colonoscopy. She denied chest pain and shortness of breath.   Past Medical History:  Diagnosis Date   A-fib (HCC)    resolved   Anxiety    Arrhythmia    bradycardia   CHF (congestive heart failure) (HCC)    resolved   Depression    DVT (deep venous thrombosis) (HCC)    GERD (gastroesophageal reflux disease)    Heart transplanted (HCC)    Followed at [facility] - Dr. Laurence Compton   HTN (hypertension)    Hyperparathyroidism [facility])    resolved   Osteoporosis    Pacemaker    resolved   Past Surgical History:  Procedure Laterality Date   ABDOMINAL HYSTERECTOMY     ABLATION  2007   ABLATION ON ENDOMETRIOSIS     APPENDECTOMY     BREAST EXCISIONAL BIOPSY Left 1986   benign   CARDIAC CATHETERIZATION  2009   HEART TRANSPLANT  2009   HEART TRANSPLANT     NECK SURGERY     removal of parathyroid   PACEMAKER INSERTION  2005   TOTAL ABDOMINAL HYSTERECTOMY W/ BILATERAL SALPINGOOPHORECTOMY     Current Facility-Administered Medications  Medication Dose Route Frequency Provider Last Rate Last Admin   0.9 %  sodium chloride infusion   Intravenous Continuous [place], Eric J, DO       acetaminophen (TYLENOL) tablet 650 mg  650 mg Oral Q6H PRN Therisa Doyne, MD       Or   acetaminophen (TYLENOL) suppository 650 mg  650 mg Rectal Q6H PRN Doutova, Anastassia, MD       albuterol (PROVENTIL) (2.5 MG/3ML) 0.083% nebulizer solution 2.5 mg  2.5 mg Nebulization Q2H PRN Doutova, Anastassia, MD       alum  & mag hydroxide-simeth (MAALOX/MYLANTA) 200-200-20 MG/5ML suspension 30 mL  30 mL Oral Q4H PRN Anthoney Harada, NP   30 mL at 07/06/23 2201   clonazePAM (KLONOPIN) tablet 0.5 mg  0.5 mg Oral QHS PRN Therisa Doyne, MD       diltiazem (CARDIZEM CD) 24 hr capsule 120 mg  120 mg Oral BID Doutova, Anastassia, MD   120 mg at 07/07/23 0829   fluticasone furoate-vilanterol (BREO ELLIPTA) 100-25 MCG/ACT 1 puff  1 puff Inhalation Daily Doutova, Anastassia, MD   1 puff at 07/07/23 0808   HYDROmorphone (DILAUDID) injection 1 mg  1 mg Intravenous Q2H PRN [place], Alvira Philips, DO   1 mg at 07/07/23 1223   levothyroxine (SYNTHROID) tablet 50 mcg  50 mcg Oral Q0600 Therisa Doyne, MD   50 mcg at 07/07/23 0637   losartan (COZAAR) tablet 50 mg  50 mg Oral Daily Chilton Si, MD   50 mg at 07/07/23 4696   mycophenolate (CELLCEPT) capsule 250 mg  250 mg Oral BID Therisa Doyne, MD   250 mg at 07/07/23 0829   ondansetron (ZOFRAN) tablet 4 mg  4 mg Oral Q6H PRN Therisa Doyne, MD   4 mg at 07/06/23 1554   Or   ondansetron (ZOFRAN) injection 4 mg  4 mg Intravenous Q6H PRN Therisa Doyne, MD   4 mg at 07/06/23 0932   pantoprazole (PROTONIX) injection 40 mg  40 mg Intravenous Q12H Doutova, Anastassia, MD   40 mg at 07/07/23 0829   polyethylene glycol (MIRALAX / GLYCOLAX) packet 17 g  17 g Oral Q4H Magod, Vernia Buff, MD   17 g at 07/06/23 1812   sorbitol, milk of mag, mineral oil, glycerin (SMOG) enema  960 mL Rectal Once [place], Eric J, DO       tacrolimus (PROGRAF) capsule 0.5 mg  0.5 mg Oral BID Therisa Doyne, MD   0.5 mg at 07/07/23 0829   Allergies as of 07/05/2023 - Review Complete 07/05/2023  Allergen Reaction Noted   Antihistamines, chlorpheniramine-type Other (See Comments) 12/21/2014   Augmentin [amoxicillin-pot clavulanate]  04/18/2021   Cyclobenzaprine Other (See Comments) 08/18/2013   Fentanyl Other (See Comments) 12/15/2020   Metronidazole  08/14/2021   Valtrex [valacyclovir]   04/18/2021   Codeine Nausea Only 12/21/2014   Review of Systems:  Review of Systems  Respiratory:  Negative for shortness of breath.   Cardiovascular:  Negative for chest pain.  Gastrointestinal:  Positive for abdominal pain and nausea. Negative for vomiting.       No flatus or bowel movement today.    OBJECTIVE:   Temp:  [98.2 F (36.8 C)-98.7 F (37.1 C)] 98.6 F (37 C) (11/09 0721) Pulse Rate:  [84-94] 87 (11/09 0721) Resp:  [16-20] 16 (11/09 0721) BP: (143-171)/(55-77) 143/55 (11/09 0721) SpO2:  [94 %-97 %] 94 % (11/09 0721) Last BM Date : 07/02/23 Physical Exam Constitutional:      General: She is not in acute distress.    Appearance: She is not ill-appearing, toxic-appearing or diaphoretic.  Cardiovascular:     Rate and Rhythm: Normal rate and regular rhythm.  Pulmonary:     Effort: No respiratory distress.     Breath sounds: Normal breath sounds.  Abdominal:     General: Bowel sounds are normal. There is no distension.     Palpations: Abdomen is soft.     Tenderness: There is abdominal tenderness (RUQ, RLQ). There is no guarding.  Skin:    General: Skin is warm and dry.  Neurological:     Mental Status: She is alert.     Labs: Recent Labs    07/05/23 1514 07/06/23 0627 07/07/23 0557  WBC 18.4* 15.8* 17.3*  HGB 15.0 13.5 14.3  HCT 43.5 39.7 42.4  PLT 325 269 305   BMET Recent Labs    07/05/23 1514 07/06/23 0627 07/07/23 0557  NA 134* 136 136  K 4.1 3.8 4.2  CL 101 108 104  CO2 25 21* 23  GLUCOSE 120* 134* 143*  BUN 17 15 12   CREATININE 1.13* 0.93 0.96  CALCIUM 9.9 8.4* 8.8*   LFT Recent Labs    07/06/23 0627  PROT 5.6*  ALBUMIN 2.6*  AST 14*  ALT 11  ALKPHOS 80  BILITOT 0.8   PT/INR No results for input(s): "LABPROT", "INR" in the last 72 hours. Diagnostic imaging: DG CHEST PORT 1 VIEW  Result Date: 07/06/2023 CLINICAL DATA:  Pre-op cardiovascular examination. EXAM: PORTABLE CHEST 1 VIEW COMPARISON:  02/16/2023 FINDINGS: Chronic  elevation of the right hemidiaphragm. No evidence for focal airspace disease and no overt pulmonary edema. Heart size is within normal limits and grossly stable. Median sternotomy wires are present. Atherosclerotic calcifications at the aortic arch. IMPRESSION: 1. No acute cardiopulmonary disease. 2. Chronic elevation of the right hemidiaphragm. Electronically  Signed   By: Richarda Overlie M.D.   On: 07/06/2023 08:42   CT ABDOMEN PELVIS W CONTRAST  Result Date: 07/05/2023 CLINICAL DATA:  Nonlocalized acute abdominal pain. EXAM: CT ABDOMEN AND PELVIS WITH CONTRAST TECHNIQUE: Multidetector CT imaging of the abdomen and pelvis was performed using the standard protocol following bolus administration of intravenous contrast. RADIATION DOSE REDUCTION: This exam was performed according to the departmental dose-optimization program which includes automated exposure control, adjustment of the mA and/or kV according to patient size and/or use of iterative reconstruction technique. CONTRAST:  80mL OMNIPAQUE IOHEXOL 300 MG/ML  SOLN COMPARISON:  03/15/2022. FINDINGS: Lower chest: No acute abnormality. Hepatobiliary: No focal liver abnormality is seen. No gallstones, gallbladder wall thickening, or biliary dilatation. Pancreas: Unremarkable. No pancreatic ductal dilatation or surrounding inflammatory changes. Spleen: Normal in size without focal abnormality. Adrenals/Urinary Tract: Normal appearance of the adrenal glands. No kidney mass, nephrolithiasis or signs of obstructive uropathy. Stomach/Bowel: Moderate hiatal hernia. Stomach otherwise unremarkable. There is no small bowel wall thickening, inflammation or distension. Atypical location of the cecum within the right upper quadrant of the abdomen. There is abnormal dilatation of the cecum and proximal colon up to the level of the mid transverse colon. The dilated colon measures up to 7.3 cm and there is mild wall thickening with surrounding soft tissue stranding, image 53/2. No  pneumatosis. Abrupt transition to decreased caliber mid and distal transverse colon is identified at this level. At the transition point there is a desiccated stool ball measuring 5.6 by 4.8 cm. Underlying colonic stricture either benign or malignant is suspected at this level. Distal colonic diverticulosis noted without signs of acute diverticulitis. Vascular/Lymphatic: Aortic atherosclerosis. No aneurysm. No signs of abdominopelvic adenopathy. Reproductive: Status post hysterectomy. No adnexal masses. Other: No free fluid or fluid collections. No signs of pneumoperitoneum. Musculoskeletal: No acute or significant osseous findings. IMPRESSION: 1. Signs of proximal colonic obstruction with abnormal dilatation of the cecum and proximal colon up to the level of the mid transverse colon. The dilated proximal colon measures up to 7.3 cm and there is mild wall thickening with surrounding soft tissue stranding. Abrupt transition to decreased caliber mid and distal colon is identified at this level. At the transition point there is a desiccated stool ball measuring 5.6 by 4.8 cm. Underlying colonic stricture either benign or malignant is suspected at this level. Surgical consultation is advised. 2. Moderate hiatal hernia. 3. Distal colonic diverticulosis without signs of acute diverticulitis. 4.  Aortic Atherosclerosis (ICD10-I70.0). Electronically Signed   By: Signa Kell M.D.   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 (somewhat tethered and featureless appearance of transverse colon) in the past Nausea secondary to above, unable to tolerate bowel preparation  History  heart transplant History atrial fibrillation GERD HTN  PLAN: -Agree with enema planned for today, follow up whether successful with stool/flatus -If enema is successful, would recommend attempt colonoscopy on 07/08/23, discussed benefits, alternatives and risks of procedure with patient including bleeding/infection/perforation/missed lesion/anesthesia, she verbalized understanding and elected to proceed if indicated  -If enemas are not successful, would recommend surgical intervention -NPO given nausea -[facility] will follow   LOS: 2 days   Liliane Shi, [facility]

## 2023-07-07 NOTE — Plan of Care (Signed)
  Problem: Education: Goal: Knowledge of General Education information will improve Description: Including pain rating scale, medication(s)/side effects and non-pharmacologic comfort measures 07/07/2023 0700 by Aldean Baker, RN Outcome: Progressing 07/07/2023 0700 by Aldean Baker, RN Outcome: Progressing 07/07/2023 0700 by Aldean Baker, RN Outcome: Progressing   Problem: Health Behavior/Discharge Planning: Goal: Ability to manage health-related needs will improve 07/07/2023 0700 by Aldean Baker, RN Outcome: Progressing 07/07/2023 0700 by Frances Furbish T, RN Outcome: Progressing 07/07/2023 0700 by Frances Furbish T, RN Outcome: Progressing   Problem: Clinical Measurements: Goal: Ability to maintain clinical measurements within normal limits will improve 07/07/2023 0700 by Aldean Baker, RN Outcome: Progressing 07/07/2023 0700 by Frances Furbish T, RN Outcome: Progressing 07/07/2023 0700 by Frances Furbish T, RN Outcome: Progressing Goal: Will remain free from infection 07/07/2023 0700 by Aldean Baker, RN Outcome: Progressing 07/07/2023 0700 by Aldean Baker, RN Outcome: Progressing 07/07/2023 0700 by Frances Furbish T, RN Outcome: Progressing Goal: Diagnostic test results will improve 07/07/2023 0700 by Aldean Baker, RN Outcome: Progressing 07/07/2023 0700 by Aldean Baker, RN Outcome: Progressing 07/07/2023 0700 by Frances Furbish T, RN Outcome: Progressing Goal: Respiratory complications will improve 07/07/2023 0700 by Aldean Baker, RN Outcome: Progressing 07/07/2023 0700 by Frances Furbish T, RN Outcome: Progressing 07/07/2023 0700 by Frances Furbish T, RN Outcome: Progressing Goal: Cardiovascular complication will be avoided 07/07/2023 0700 by Aldean Baker, RN Outcome: Progressing 07/07/2023 0700 by Aldean Baker, RN Outcome: Progressing 07/07/2023 0700 by Aldean Baker, RN Outcome: Progressing   Problem: Activity: Goal: Risk for activity intolerance will decrease 07/07/2023 0700 by Aldean Baker, RN Outcome: Progressing 07/07/2023 0700 by Aldean Baker, RN Outcome: Progressing 07/07/2023 0700 by Frances Furbish T, RN Outcome: Progressing   Problem: Nutrition: Goal: Adequate nutrition will be maintained 07/07/2023 0700 by Aldean Baker, RN Outcome: Progressing 07/07/2023 0700 by Aldean Baker, RN Outcome: Progressing 07/07/2023 0700 by Frances Furbish T, RN Outcome: Progressing   Problem: Coping: Goal: Level of anxiety will decrease 07/07/2023 0700 by Frances Furbish T, RN Outcome: Progressing 07/07/2023 0700 by Frances Furbish T, RN Outcome: Progressing 07/07/2023 0700 by Frances Furbish T, RN Outcome: Progressing   Problem: Elimination: Goal: Will not experience complications related to bowel motility 07/07/2023 0700 by Aldean Baker, RN Outcome: Progressing 07/07/2023 0700 by Frances Furbish T, RN Outcome: Progressing 07/07/2023 0700 by Frances Furbish T, RN Outcome: Progressing Goal: Will not experience complications related to urinary retention 07/07/2023 0700 by Aldean Baker, RN Outcome: Progressing 07/07/2023 0700 by Frances Furbish T, RN Outcome: Progressing 07/07/2023 0700 by Frances Furbish T, RN Outcome: Progressing   Problem: Pain Management: Goal: General experience of comfort will improve 07/07/2023 0700 by Aldean Baker, RN Outcome: Progressing 07/07/2023 0700 by Frances Furbish T, RN Outcome: Progressing 07/07/2023 0700 by Frances Furbish T, RN Outcome: Progressing   Problem: Safety: Goal: Ability to remain free from injury will improve 07/07/2023 0700 by Frances Furbish T, RN Outcome: Progressing 07/07/2023 0700 by Frances Furbish T, RN Outcome: Progressing 07/07/2023 0700 by Frances Furbish T, RN Outcome: Progressing    Problem: Skin Integrity: Goal: Risk for impaired skin integrity will decrease 07/07/2023 0700 by Frances Furbish T, RN Outcome: Progressing 07/07/2023 0700 by Aldean Baker, RN Outcome: Progressing 07/07/2023 0700 by Aldean Baker, RN Outcome: Progressing

## 2023-07-07 NOTE — Progress Notes (Signed)
Pt says she feels the Miralax is giving her bad indigestion and does not want it anymore as she feels so much discomfort. She also wants something for the indigestion. Provider responded with order to use "General Admission PRN medications" order set, it has maalox available.    Fri 9:14 PM Pt wants a bigger dose of the morphine. she wants something that works better than the morphine. Provider advised that patient had not been using using morphine per charting documentation and was just given the morphine dose. Provider advised to reassess her pain 30 min after the IV administrations   Fri 10:09 PM Patient is almost crying and says it is worse.Provider notified. One time Dilaudid of 0.25mg  was given without improvement. Vitals completed and provider made aware. Provider ordered another 1mg  of dilaudid. Pt reports improvement thereafter.   Sat 6:30am Pt reports pain was restarting. Provider paged if morphine or Dilaudid should be given. Provider advised to give Morphine.  CN made aware. Care handed over to day RN.

## 2023-07-07 NOTE — Progress Notes (Signed)
Echocardiogram 2D Echocardiogram has been performed.  Fread Kottke N Ozie Dimaria,RDCS 07/07/2023, 9:41 AM

## 2023-07-07 NOTE — Progress Notes (Signed)
Subjective/Chief Complaint: Patient having pain and requiring narcotics.  Says this is better this AM. Hasn't passed gas or had flatus since Tuesday. No nausea or vomiting. No appetite   Objective: Vital signs in last 24 hours: Temp:  [98.2 F (36.8 C)-98.7 F (37.1 C)] 98.6 F (37 C) (11/09 0721) Pulse Rate:  [84-94] 87 (11/09 0721) Resp:  [16-20] 16 (11/09 0721) BP: (143-171)/(55-77) 143/55 (11/09 0721) SpO2:  [94 %-97 %] 94 % (11/09 0721) Last BM Date : 07/02/23  Intake/Output from previous day: No intake/output data recorded. Intake/Output this shift: Total I/O In: 1348 [I.V.:1348] Out: -   General appearance: alert, cooperative, and mild distress Resp: breathing comfortably GI: soft, mild to moderate distention. Non tender  Getting echo right now.   Lab Results:  Recent Labs    07/06/23 0627 07/07/23 0557  WBC 15.8* 17.3*  HGB 13.5 14.3  HCT 39.7 42.4  PLT 269 305   BMET Recent Labs    07/06/23 0627 07/07/23 0557  NA 136 136  K 3.8 4.2  CL 108 104  CO2 21* 23  GLUCOSE 134* 143*  BUN 15 12  CREATININE 0.93 0.96  CALCIUM 8.4* 8.8*   PT/INR No results for input(s): "LABPROT", "INR" in the last 72 hours. ABG No results for input(s): "PHART", "HCO3" in the last 72 hours.  Invalid input(s): "PCO2", "PO2"  Studies/Results: DG CHEST PORT 1 VIEW  Result Date: 07/06/2023 CLINICAL DATA:  Pre-op cardiovascular examination. EXAM: PORTABLE CHEST 1 VIEW COMPARISON:  02/16/2023 FINDINGS: Chronic elevation of the right hemidiaphragm. No evidence for focal airspace disease and no overt pulmonary edema. Heart size is within normal limits and grossly stable. Median sternotomy wires are present. Atherosclerotic calcifications at the aortic arch. IMPRESSION: 1. No acute cardiopulmonary disease. 2. Chronic elevation of the right hemidiaphragm. Electronically Signed   By: Richarda Overlie M.D.   On: 07/06/2023 08:42   CT ABDOMEN PELVIS W CONTRAST  Result Date:  07/05/2023 CLINICAL DATA:  Nonlocalized acute abdominal pain. EXAM: CT ABDOMEN AND PELVIS WITH CONTRAST TECHNIQUE: Multidetector CT imaging of the abdomen and pelvis was performed using the standard protocol following bolus administration of intravenous contrast. RADIATION DOSE REDUCTION: This exam was performed according to the departmental dose-optimization program which includes automated exposure control, adjustment of the mA and/or kV according to patient size and/or use of iterative reconstruction technique. CONTRAST:  80mL OMNIPAQUE IOHEXOL 300 MG/ML  SOLN COMPARISON:  03/15/2022. FINDINGS: Lower chest: No acute abnormality. Hepatobiliary: No focal liver abnormality is seen. No gallstones, gallbladder wall thickening, or biliary dilatation. Pancreas: Unremarkable. No pancreatic ductal dilatation or surrounding inflammatory changes. Spleen: Normal in size without focal abnormality. Adrenals/Urinary Tract: Normal appearance of the adrenal glands. No kidney mass, nephrolithiasis or signs of obstructive uropathy. Stomach/Bowel: Moderate hiatal hernia. Stomach otherwise unremarkable. There is no small bowel wall thickening, inflammation or distension. Atypical location of the cecum within the right upper quadrant of the abdomen. There is abnormal dilatation of the cecum and proximal colon up to the level of the mid transverse colon. The dilated colon measures up to 7.3 cm and there is mild wall thickening with surrounding soft tissue stranding, image 53/2. No pneumatosis. Abrupt transition to decreased caliber mid and distal transverse colon is identified at this level. At the transition point there is a desiccated stool ball measuring 5.6 by 4.8 cm. Underlying colonic stricture either benign or malignant is suspected at this level. Distal colonic diverticulosis noted without signs of acute diverticulitis. Vascular/Lymphatic: Aortic atherosclerosis. No  aneurysm. No signs of abdominopelvic adenopathy. Reproductive:  Status post hysterectomy. No adnexal masses. Other: No free fluid or fluid collections. No signs of pneumoperitoneum. Musculoskeletal: No acute or significant osseous findings. IMPRESSION: 1. Signs of proximal colonic obstruction with abnormal dilatation of the cecum and proximal colon up to the level of the mid transverse colon. The dilated proximal colon measures up to 7.3 cm and there is mild wall thickening with surrounding soft tissue stranding. Abrupt transition to decreased caliber mid and distal colon is identified at this level. At the transition point there is a desiccated stool ball measuring 5.6 by 4.8 cm. Underlying colonic stricture either benign or malignant is suspected at this level. Surgical consultation is advised. 2. Moderate hiatal hernia. 3. Distal colonic diverticulosis without signs of acute diverticulitis. 4.  Aortic Atherosclerosis (ICD10-I70.0). Electronically Signed   By: Signa Kell M.D.   On: 07/05/2023 19:14    Anti-infectives: Anti-infectives (From admission, onward)    None       Assessment/Plan: Colonic obstruction unclear etiology S/p OHT, on cellcept/prograf H/o VTE- Holding eliquis, ok for lovenox or heparin gtt hyperglycemia  GI following, planning colonoscopy I am making her npo other than sips of clears/ice chips, and medications for now. Recheck AXR to evaluate colonic distention.   No indications for emergent surgery, but will likely need stricture resected whether benign or malignant.    LOS: 2 days    Almond Lint 07/07/2023

## 2023-07-07 NOTE — Plan of Care (Signed)

## 2023-07-07 NOTE — H&P (View-Only) (Signed)
[facility] Progress Note  SUBJECTIVE:   Interval history: [name] [name] was seen and evaluated today at bedside. Resting comfortably in bed. IM and General Surgery notes appreciated. Awaiting final read on abdominal xray. Pending SMOG enema per IM.   She noted having abdominal pain today, worse in right abdomen. She has nausea, no vomiting. She was unable to tolerate Miralax. She has not had flatus nor bowel movement. She would not be able to tolerate a bowel preparation for colonoscopy. She denied chest pain and shortness of breath.   Past Medical History:  Diagnosis Date   A-fib (HCC)    resolved   Anxiety    Arrhythmia    bradycardia   CHF (congestive heart failure) (HCC)    resolved   Depression    DVT (deep venous thrombosis) (HCC)    GERD (gastroesophageal reflux disease)    Heart transplanted (HCC)    Followed at [facility] - Dr. Laurence Compton   HTN (hypertension)    Hyperparathyroidism [facility])    resolved   Osteoporosis    Pacemaker    resolved   Past Surgical History:  Procedure Laterality Date   ABDOMINAL HYSTERECTOMY     ABLATION  2007   ABLATION ON ENDOMETRIOSIS     APPENDECTOMY     BREAST EXCISIONAL BIOPSY Left 1986   benign   CARDIAC CATHETERIZATION  2009   HEART TRANSPLANT  2009   HEART TRANSPLANT     NECK SURGERY     removal of parathyroid   PACEMAKER INSERTION  2005   TOTAL ABDOMINAL HYSTERECTOMY W/ BILATERAL SALPINGOOPHORECTOMY     Current Facility-Administered Medications  Medication Dose Route Frequency Provider Last Rate Last Admin   0.9 %  sodium chloride infusion   Intravenous Continuous [place], Eric J, DO       acetaminophen (TYLENOL) tablet 650 mg  650 mg Oral Q6H PRN Therisa Doyne, MD       Or   acetaminophen (TYLENOL) suppository 650 mg  650 mg Rectal Q6H PRN Doutova, Anastassia, MD       albuterol (PROVENTIL) (2.5 MG/3ML) 0.083% nebulizer solution 2.5 mg  2.5 mg Nebulization Q2H PRN Doutova, Anastassia, MD       alum  & mag hydroxide-simeth (MAALOX/MYLANTA) 200-200-20 MG/5ML suspension 30 mL  30 mL Oral Q4H PRN Anthoney Harada, NP   30 mL at 07/06/23 2201   clonazePAM (KLONOPIN) tablet 0.5 mg  0.5 mg Oral QHS PRN Therisa Doyne, MD       diltiazem (CARDIZEM CD) 24 hr capsule 120 mg  120 mg Oral BID Doutova, Anastassia, MD   120 mg at 07/07/23 0829   fluticasone furoate-vilanterol (BREO ELLIPTA) 100-25 MCG/ACT 1 puff  1 puff Inhalation Daily Doutova, Anastassia, MD   1 puff at 07/07/23 0808   HYDROmorphone (DILAUDID) injection 1 mg  1 mg Intravenous Q2H PRN [place], Alvira Philips, DO   1 mg at 07/07/23 1223   levothyroxine (SYNTHROID) tablet 50 mcg  50 mcg Oral Q0600 Therisa Doyne, MD   50 mcg at 07/07/23 0637   losartan (COZAAR) tablet 50 mg  50 mg Oral Daily Chilton Si, MD   50 mg at 07/07/23 4696   mycophenolate (CELLCEPT) capsule 250 mg  250 mg Oral BID Therisa Doyne, MD   250 mg at 07/07/23 0829   ondansetron (ZOFRAN) tablet 4 mg  4 mg Oral Q6H PRN Therisa Doyne, MD   4 mg at 07/06/23 1554   Or   ondansetron (ZOFRAN) injection 4 mg  4 mg Intravenous Q6H PRN Therisa Doyne, MD   4 mg at 07/06/23 0932   pantoprazole (PROTONIX) injection 40 mg  40 mg Intravenous Q12H Doutova, Anastassia, MD   40 mg at 07/07/23 0829   polyethylene glycol (MIRALAX / GLYCOLAX) packet 17 g  17 g Oral Q4H Magod, Vernia Buff, MD   17 g at 07/06/23 1812   sorbitol, milk of mag, mineral oil, glycerin (SMOG) enema  960 mL Rectal Once [place], Eric J, DO       tacrolimus (PROGRAF) capsule 0.5 mg  0.5 mg Oral BID Therisa Doyne, MD   0.5 mg at 07/07/23 0829   Allergies as of 07/05/2023 - Review Complete 07/05/2023  Allergen Reaction Noted   Antihistamines, chlorpheniramine-type Other (See Comments) 12/21/2014   Augmentin [amoxicillin-pot clavulanate]  04/18/2021   Cyclobenzaprine Other (See Comments) 08/18/2013   Fentanyl Other (See Comments) 12/15/2020   Metronidazole  08/14/2021   Valtrex [valacyclovir]   04/18/2021   Codeine Nausea Only 12/21/2014   Review of Systems:  Review of Systems  Respiratory:  Negative for shortness of breath.   Cardiovascular:  Negative for chest pain.  Gastrointestinal:  Positive for abdominal pain and nausea. Negative for vomiting.       No flatus or bowel movement today.    OBJECTIVE:   Temp:  [98.2 F (36.8 C)-98.7 F (37.1 C)] 98.6 F (37 C) (11/09 0721) Pulse Rate:  [84-94] 87 (11/09 0721) Resp:  [16-20] 16 (11/09 0721) BP: (143-171)/(55-77) 143/55 (11/09 0721) SpO2:  [94 %-97 %] 94 % (11/09 0721) Last BM Date : 07/02/23 Physical Exam Constitutional:      General: She is not in acute distress.    Appearance: She is not ill-appearing, toxic-appearing or diaphoretic.  Cardiovascular:     Rate and Rhythm: Normal rate and regular rhythm.  Pulmonary:     Effort: No respiratory distress.     Breath sounds: Normal breath sounds.  Abdominal:     General: Bowel sounds are normal. There is no distension.     Palpations: Abdomen is soft.     Tenderness: There is abdominal tenderness (RUQ, RLQ). There is no guarding.  Skin:    General: Skin is warm and dry.  Neurological:     Mental Status: She is alert.     Labs: Recent Labs    07/05/23 1514 07/06/23 0627 07/07/23 0557  WBC 18.4* 15.8* 17.3*  HGB 15.0 13.5 14.3  HCT 43.5 39.7 42.4  PLT 325 269 305   BMET Recent Labs    07/05/23 1514 07/06/23 0627 07/07/23 0557  NA 134* 136 136  K 4.1 3.8 4.2  CL 101 108 104  CO2 25 21* 23  GLUCOSE 120* 134* 143*  BUN 17 15 12   CREATININE 1.13* 0.93 0.96  CALCIUM 9.9 8.4* 8.8*   LFT Recent Labs    07/06/23 0627  PROT 5.6*  ALBUMIN 2.6*  AST 14*  ALT 11  ALKPHOS 80  BILITOT 0.8   PT/INR No results for input(s): "LABPROT", "INR" in the last 72 hours. Diagnostic imaging: DG CHEST PORT 1 VIEW  Result Date: 07/06/2023 CLINICAL DATA:  Pre-op cardiovascular examination. EXAM: PORTABLE CHEST 1 VIEW COMPARISON:  02/16/2023 FINDINGS: Chronic  elevation of the right hemidiaphragm. No evidence for focal airspace disease and no overt pulmonary edema. Heart size is within normal limits and grossly stable. Median sternotomy wires are present. Atherosclerotic calcifications at the aortic arch. IMPRESSION: 1. No acute cardiopulmonary disease. 2. Chronic elevation of the right hemidiaphragm. Electronically  Signed   By: Richarda Overlie M.D.   On: 07/06/2023 08:42   CT ABDOMEN PELVIS W CONTRAST  Result Date: 07/05/2023 CLINICAL DATA:  Nonlocalized acute abdominal pain. EXAM: CT ABDOMEN AND PELVIS WITH CONTRAST TECHNIQUE: Multidetector CT imaging of the abdomen and pelvis was performed using the standard protocol following bolus administration of intravenous contrast. RADIATION DOSE REDUCTION: This exam was performed according to the departmental dose-optimization program which includes automated exposure control, adjustment of the mA and/or kV according to patient size and/or use of iterative reconstruction technique. CONTRAST:  80mL OMNIPAQUE IOHEXOL 300 MG/ML  SOLN COMPARISON:  03/15/2022. FINDINGS: Lower chest: No acute abnormality. Hepatobiliary: No focal liver abnormality is seen. No gallstones, gallbladder wall thickening, or biliary dilatation. Pancreas: Unremarkable. No pancreatic ductal dilatation or surrounding inflammatory changes. Spleen: Normal in size without focal abnormality. Adrenals/Urinary Tract: Normal appearance of the adrenal glands. No kidney mass, nephrolithiasis or signs of obstructive uropathy. Stomach/Bowel: Moderate hiatal hernia. Stomach otherwise unremarkable. There is no small bowel wall thickening, inflammation or distension. Atypical location of the cecum within the right upper quadrant of the abdomen. There is abnormal dilatation of the cecum and proximal colon up to the level of the mid transverse colon. The dilated colon measures up to 7.3 cm and there is mild wall thickening with surrounding soft tissue stranding, image 53/2. No  pneumatosis. Abrupt transition to decreased caliber mid and distal transverse colon is identified at this level. At the transition point there is a desiccated stool ball measuring 5.6 by 4.8 cm. Underlying colonic stricture either benign or malignant is suspected at this level. Distal colonic diverticulosis noted without signs of acute diverticulitis. Vascular/Lymphatic: Aortic atherosclerosis. No aneurysm. No signs of abdominopelvic adenopathy. Reproductive: Status post hysterectomy. No adnexal masses. Other: No free fluid or fluid collections. No signs of pneumoperitoneum. Musculoskeletal: No acute or significant osseous findings. IMPRESSION: 1. Signs of proximal colonic obstruction with abnormal dilatation of the cecum and proximal colon up to the level of the mid transverse colon. The dilated proximal colon measures up to 7.3 cm and there is mild wall thickening with surrounding soft tissue stranding. Abrupt transition to decreased caliber mid and distal colon is identified at this level. At the transition point there is a desiccated stool ball measuring 5.6 by 4.8 cm. Underlying colonic stricture either benign or malignant is suspected at this level. Surgical consultation is advised. 2. Moderate hiatal hernia. 3. Distal colonic diverticulosis without signs of acute diverticulitis. 4.  Aortic Atherosclerosis (ICD10-I70.0). Electronically Signed   By: Signa Kell M.D.   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 (somewhat tethered and featureless appearance of transverse colon) in the past Nausea secondary to above, unable to tolerate bowel preparation  History  heart transplant History atrial fibrillation GERD HTN  PLAN: -Agree with enema planned for today, follow up whether successful with stool/flatus -If enema is successful, would recommend attempt colonoscopy on 07/08/23, discussed benefits, alternatives and risks of procedure with patient including bleeding/infection/perforation/missed lesion/anesthesia, she verbalized understanding and elected to proceed if indicated  -If enemas are not successful, would recommend surgical intervention -NPO given nausea -[facility] will follow   LOS: 2 days   Liliane Shi, [facility]

## 2023-07-07 NOTE — Progress Notes (Signed)
MD notified that patient is refusing miralax.

## 2023-07-08 ENCOUNTER — Encounter (HOSPITAL_COMMUNITY)
Admission: EM | Disposition: A | Discharge status: Home Health | Payer: Self-pay | Source: Home / Self Care | Attending: Internal Medicine

## 2023-07-08 ENCOUNTER — Inpatient Hospital Stay (HOSPITAL_COMMUNITY): Payer: Medicare Other | Admitting: Anesthesiology

## 2023-07-08 ENCOUNTER — Encounter (HOSPITAL_COMMUNITY): Payer: Self-pay | Admitting: Internal Medicine

## 2023-07-08 DIAGNOSIS — K6389 Other specified diseases of intestine: Secondary | ICD-10-CM | POA: Diagnosis not present

## 2023-07-08 DIAGNOSIS — R933 Abnormal findings on diagnostic imaging of other parts of digestive tract: Secondary | ICD-10-CM | POA: Diagnosis not present

## 2023-07-08 DIAGNOSIS — K5730 Diverticulosis of large intestine without perforation or abscess without bleeding: Secondary | ICD-10-CM

## 2023-07-08 DIAGNOSIS — K56609 Unspecified intestinal obstruction, unspecified as to partial versus complete obstruction: Secondary | ICD-10-CM | POA: Diagnosis not present

## 2023-07-08 HISTORY — PX: COLONOSCOPY: SHX5424

## 2023-07-08 LAB — CBC
HCT: 38.7 % (ref 36.0–46.0)
Hemoglobin: 13.1 g/dL (ref 12.0–15.0)
MCH: 30.4 pg (ref 26.0–34.0)
MCHC: 33.9 g/dL (ref 30.0–36.0)
MCV: 89.8 fL (ref 80.0–100.0)
Platelets: 293 10*3/uL (ref 150–400)
RBC: 4.31 MIL/uL (ref 3.87–5.11)
RDW: 13 % (ref 11.5–15.5)
WBC: 21.1 10*3/uL — ABNORMAL HIGH (ref 4.0–10.5)
nRBC: 0 % (ref 0.0–0.2)

## 2023-07-08 LAB — BASIC METABOLIC PANEL
Anion gap: 10 (ref 5–15)
BUN: 13 mg/dL (ref 8–23)
CO2: 21 mmol/L — ABNORMAL LOW (ref 22–32)
Calcium: 8.6 mg/dL — ABNORMAL LOW (ref 8.9–10.3)
Chloride: 105 mmol/L (ref 98–111)
Creatinine, Ser: 0.85 mg/dL (ref 0.44–1.00)
GFR, Estimated: >60 mL/min (ref >60)
Glucose, Bld: 132 mg/dL — ABNORMAL HIGH (ref 70–99)
Potassium: 3.8 mmol/L (ref 3.5–5.1)
Sodium: 136 mmol/L (ref 135–145)

## 2023-07-08 LAB — MAGNESIUM: Magnesium: 1.9 mg/dL (ref 1.7–2.4)

## 2023-07-08 SURGERY — COLONOSCOPY
Anesthesia: Monitor Anesthesia Care

## 2023-07-08 MED ORDER — SORBITOL 70 % SOLN
960.0000 mL | TOPICAL_OIL | Freq: Once | ORAL | Status: AC
  Administered 2023-07-08: 960 mL via RECTAL
  Filled 2023-07-08: qty 240

## 2023-07-08 MED ORDER — PROPOFOL 10 MG/ML IV BOLUS
INTRAVENOUS | Status: DC | PRN
  Administered 2023-07-08: 20 mg via INTRAVENOUS

## 2023-07-08 MED ORDER — KCL IN DEXTROSE-NACL 20-5-0.45 MEQ/L-%-% IV SOLN
INTRAVENOUS | Status: DC
  Filled 2023-07-08 (×2): qty 1000

## 2023-07-08 MED ORDER — PROPOFOL 500 MG/50ML IV EMUL
INTRAVENOUS | Status: DC | PRN
  Administered 2023-07-08: 150 ug/kg/min via INTRAVENOUS

## 2023-07-08 MED ORDER — LIDOCAINE 2% (20 MG/ML) 5 ML SYRINGE
INTRAMUSCULAR | Status: DC | PRN
  Administered 2023-07-08: 40 mg via INTRAVENOUS

## 2023-07-08 NOTE — Op Note (Signed)
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 Estimated Blood Loss:     Estimated blood loss: none. Procedure:                Pre-Anesthesia Assessment:                           - ASA Grade Assessment: III - A patient with severe                            systemic disease.                           - The risks and benefits of the procedure and the                            sedation options and risks were discussed with the                            patient. All questions were answered and informed                            consent was obtained.                           After obtaining informed consent, the colonoscope                            was passed under direct vision. Throughout the                            procedure, the patient's blood pressure, pulse, and                            oxygen saturations were monitored  continuously. The                            [id number]-HQ190L ([id number] Olympus colonoscope was                            introduced through the anus and advanced to the the                            sigmoid colon. The colonoscopy was performed                            without difficulty. The patient tolerated the                            procedure well. The quality of the  bowel                            preparation was poor, formed stool that was unable                            to be traversed was met at 25 cm from anus. Scope In: 8:07:22 AM Scope Out: 8:12:04 AM Total Procedure Duration: 0 hours 4 minutes 42 seconds  Findings:      The digital rectal exam findings include internal hemorrhoids (Grade I).      A few small-mouthed diverticula were found in the sigmoid colon.      A scattered area of mildly erythematous mucosa was found in the       recto-sigmoid colon. Suspected from recent enema administration.      Solid stool was found in the sigmoid colon at 25 cm, precluding       visualization. Lavage of the area was performed using a small amount,       resulting in discontinuation of procedure. Impression:               - Internal hemorrhoids (Grade I) found on digital                            rectal exam.                           - Diverticulosis in the sigmoid colon.                           - Erythematous mucosa in the recto-sigmoid colon.                           - Stool in the sigmoid colon.                           - No specimens collected. Recommendation:           - Return patient to hospital ward for ongoing care.                           - NPO.                           -  Continue present medications.                           - Informed General Surgery team of endoscopy                            findings, recommended evaluation by their team for                            any further recommendations.                           - Can consider repeat enema for further clearance                            of stool, though will unlikely change course of                            clinical care, as patient likely needs surgical                            intervention for transverse colon stricture. Procedure Code(s):        --- Professional ---                           [phone], 53, Colonoscopy, flexible; diagnostic,                             including collection of specimen(s) by brushing or                            washing, when performed (separate procedure) Diagnosis Code(s):        --- Professional ---                           K64.0, First degree hemorrhoids                           K63.89, Other specified diseases of intestine                           R10.84, Generalized abdominal pain                           K57.30, Diverticulosis of large intestine without                            perforation or abscess without bleeding                           R93.3, Abnormal findings on diagnostic imaging of                            other parts of digestive tract CPT copyright 2022 [employer]. All rights reserved. The codes documented in this report are preliminary and upon coder review may  be revised to meet current compliance requirements. Dr Liliane Shi, DO Liliane Shi DO, DO 07/08/2023 8:28:14 AM Number of Addenda: 0

## 2023-07-08 NOTE — Progress Notes (Signed)
Patient ID: [name] [name], female   DOB: Jul 28, 1941, 82 y.o.   MRN: [id number] Day of Surgery    Subjective: Colonoscopy results noted ROS negative except as listed above. Objective: Vital signs in last 24 hours: Temp:  [97.4 F (36.3 C)-99.1 F (37.3 C)] 98.7 F (37.1 C) (11/10 1135) Pulse Rate:  [78-98] 88 (11/10 1135) Resp:  [16-24] 24 (11/10 1135) BP: (114-173)/(45-68) 162/52 (11/10 1135) SpO2:  [92 %-97 %] 95 % (11/10 1135) Weight:  [82.1 kg] 82.1 kg (11/10 0735) Last BM Date : 07/07/23  Intake/Output from previous day: 11/09 0701 - 11/10 0700 In: 2105.3 [I.V.:2105.3] Out: -  Intake/Output this shift: Total I/O In: 800 [I.V.:800] Out: -   Up on [facility]  Lab Results: CBC  Recent Labs    07/07/23 0557 07/08/23 0600  WBC 17.3* 21.1*  HGB 14.3 13.1  HCT 42.4 38.7  PLT 305 293   BMET Recent Labs    07/07/23 0557 07/08/23 0600  NA 136 136  K 4.2 3.8  CL 104 105  CO2 23 21*  GLUCOSE 143* 132*  BUN 12 13  CREATININE 0.96 0.85  CALCIUM 8.8* 8.6*   PT/INR No results for input(s): "LABPROT", "INR" in the last 72 hours. ABG No results for input(s): "PHART", "HCO3" in the last 72 hours.  Invalid input(s): "PCO2", "PO2"  Studies/Results:   Anti-infectives: Anti-infectives (From admission, onward)    None       Assessment/Plan: Colonic obstruction unclear etiology S/p OHT, on cellcept/prograf H/o VTE- Holding eliquis, ok for lovenox or heparin gtt hyperglycemia  Colonoscopy done this AM NPO other than sips of clears/ice chips, and medications for now.  No indications for emergent surgery, but will likely need stricture resected whether benign or malignant.   Our team will check in with her transplant team in the AM  I spoke with her daughter at the bedside   LOS: 3 days    Violeta Gelinas, MD, MPH, FACS Trauma & General Surgery Use [email or web address] to contact on call provider  07/08/2023

## 2023-07-08 NOTE — Anesthesia Preprocedure Evaluation (Signed)
Anesthesia Evaluation  Patient identified by MRN, date of birth, ID band Patient awake    Reviewed: Allergy & Precautions, NPO status , Patient's Chart, lab work & pertinent test results  History of Anesthesia Complications Negative for: history of anesthetic complications  Airway Mallampati: IV  TM Distance: >3 FB Neck ROM: Full    Dental  (+) Teeth Intact, Dental Advisory Given   Pulmonary neg shortness of breath, neg sleep apnea, neg COPD, former smoker   breath sounds clear to auscultation       Cardiovascular hypertension, Pt. on medications (-) pacemaker+ Valvular Problems/Murmurs MR and AI  Rhythm:Regular  1. Left ventricular ejection fraction, by estimation, is 60 to 65%. The  left ventricle has normal function. The left ventricle has no regional  wall motion abnormalities. Left ventricular diastolic parameters were  normal. The average left ventricular  global longitudinal strain is -21.2 %. The global longitudinal strain is  normal.   2. Right ventricular systolic function is hyperdynamic. The right  ventricular size is normal. Tricuspid regurgitation signal is inadequate  for assessing PA pressure.   3. Left atrial size was severely dilated.   4. Right atrial size was severely dilated.   5. The mitral valve is normal in structure. Mild mitral valve  regurgitation.   6. The aortic valve was not well visualized. Aortic valve regurgitation  is moderate to severe. No aortic stenosis is present. Aortic regurgitation  PHT measures 276 msec.     Neuro/Psych  PSYCHIATRIC DISORDERS Anxiety Depression       GI/Hepatic Neg liver ROS,GERD  ,, Abnormal CT imaging, concern for colonic stricture   Endo/Other  Hypothyroidism    Renal/GU negative Renal ROSLab Results      Component                Value               Date                      NA                       136                 07/08/2023                K                         3.8                 07/08/2023                CO2                      21 (L)              07/08/2023                GLUCOSE                  132 (H)             07/08/2023                BUN                      13  07/08/2023                CREATININE               0.85                07/08/2023                CALCIUM                  8.6 (L)             07/08/2023                GFRNONAA                 >60                 07/08/2023                Musculoskeletal negative musculoskeletal ROS (+)    Abdominal   Peds  Hematology Lab Results      Component                Value               Date                      WBC                      21.1 (H)            07/08/2023                HGB                      13.1                07/08/2023                HCT                      38.7                07/08/2023                MCV                      89.8                07/08/2023                PLT                      293                 07/08/2023             eliquis   Anesthesia Other Findings   Reproductive/Obstetrics                              Anesthesia Physical Anesthesia Plan  ASA: 3  Anesthesia Plan: MAC   Post-op Pain Management: Minimal or no pain anticipated   Induction:   PONV Risk Score and Plan: 2 and Propofol infusion and Treatment may vary due to age or medical condition  Airway Management Planned: Nasal Cannula, Natural Airway and Simple Face Mask  Additional Equipment: None  Intra-op Plan:   Post-operative Plan:   Informed Consent: I have reviewed the patients History and Physical, chart, labs and discussed the procedure including the risks, benefits and alternatives for the proposed anesthesia with the patient or authorized representative who has indicated his/her understanding and acceptance.     Dental advisory given  Plan Discussed with: CRNA  Anesthesia Plan Comments:           Anesthesia Quick Evaluation

## 2023-07-08 NOTE — Anesthesia Postprocedure Evaluation (Signed)
Anesthesia Post Note  Patient: [name] [name]  Procedure(s) Performed: COLONOSCOPY     Patient location during evaluation: PACU Anesthesia Type: MAC Level of consciousness: awake and alert Pain management: pain level controlled Vital Signs Assessment: post-procedure vital signs reviewed and stable Respiratory status: spontaneous breathing, nonlabored ventilation and respiratory function stable Cardiovascular status: stable and blood pressure returned to baseline Postop Assessment: no apparent nausea or vomiting Anesthetic complications: no   No notable events documented.  Last Vitals:  Vitals:   07/08/23 0830 07/08/23 0845  BP: (!) 134/46 (!) 141/45  Pulse: 80 78  Resp: 19 (!) 22  Temp:  (!) 36.3 C  SpO2: 94% 93%    Last Pain:  Vitals:   07/08/23 0845  TempSrc:   PainSc: 0-No pain                 Jarvin Ogren

## 2023-07-08 NOTE — Interval H&P Note (Signed)
History and Physical Interval Note:  07/08/2023 7:42 AM  [name] [name]  has presented today for surgery, with the diagnosis of Abnormal CT imaging, concern for colonic stricture.  The various methods of treatment have been discussed with the patient and family. After consideration of risks, benefits and other options for treatment, the patient has consented to  Procedure(s): COLONOSCOPY (N/A) as a surgical intervention.  The patient's history has been reviewed, patient examined, no change in status, stable for surgery.  I have reviewed the patient's chart and labs.  Questions were answered to the patient's satisfaction.     Lynann Bologna

## 2023-07-08 NOTE — Progress Notes (Signed)
PROGRESS NOTE    [name] [name]  ZOX:[id number] DOB: 23-Aug-1941 DOA: 07/05/2023 PCP: Cleatis Polka., MD    Brief Narrative:   [name] [name] is a 82 y.o. female with past medical history significant for HTN, hypothyroidism, history of heart transplant followed by [facility] since 2009, history of parathyroidectomy, OSA not on CPAP, history of DVT on Eliquis, history of C. difficile colitis who presented to [facility] ED on 11/7 by direction of her PCP with 3-day history of progressive abdominal pain associated with nausea and obstipation with concerns of bowel obstruction.  In the ED, temperature 98.2 F, HR 95, RR 16, BP 156/67, SpO2 95% on room air.  WBC 18.4, hemoglobin 15.0, platelet count 325.  Sodium 134, potassium 4.1, chloride 101, CO2 25, glucose 120, BUN 17, creatinine 1.13.  Lipase less than 10.  AST 17, ALT 9, total bilirubin 0.7.  Urinalysis unrevealing.  Chest x-ray with no acute cardiopulmonary disease process.  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  General surgery consulted.  Patient was started on IV fluids and kept NPO.  TRH consulted for admission and patient was transferred to [facility] for further evaluation and management.  Assessment & Plan:   Large bowel obstruction Patient presenting to the ED by discretion of her PCP after findings/concern of bowel obstruction noted on outpatient x-ray.  Patient  reports progressive abdominal pain associate with nausea and obstipation over the last 3 days.  Patient was afebrile with elevated WBC count of 18.4.  CT abdomen/pelvis with signs of proximal colonic obstruction with abnormal dilation of the cecum/proximal colon up to the level of the mid transverse colon, dilated proximal colon measuring up to 7.3 cm with wall thickening/surrounding soft tissue stranding with abrupt transition mid/distal colon with desiccated stool ball at transition point measuring 5.6 x 4.8 cm with underlying colonic stricture either benign/malignant suspected.  Patient was seen by general surgery, recommended GI consultation for consideration of colonoscopy. -- General Surgery/[facility] following, appreciate assistance -- N.p.o. -- NS at 75 mL/h -- Repeat SMOG enema today -- Colonoscopy unsuccessful per GI today, recommend surgical management  Leukocytosis WBC count elevated 18.4 on admission, likely reactive versus hemoconcentration in the setting of poor oral intake in the days preceding hospitalization due to large bowel obstruction. -- WBC 18.4>15.8>21.1 -- Continue IV fluid as above -- CBC daily  Essential hypertension Home regimen includes diltiazem 120 mg p.o. twice daily, losartan 100 mg p.o. twice daily -- Diltiazem 120 mg p.o. twice daily -- Hold home losartan for now  Hypothyroidism -- Levothyroxine 50 mcg p.o. daily  History of heart transplant Follows with [facility] since 2009.  Seen by [facility] cardiology while inpatient. -- Mycophenolate 250 mg p.o. twice daily -- Tacrolimus 0.5 mg p.o. twice daily  History of DVT -- Holding home Eliquis for anticipated need of surgical intervention for large bowel obstruction  Anxiety -- Clonazepam 0.5 m p.o. nightly as needed anxiety  GERD On omeprazole outpatient. -- Continue Protonix 40 mg IV every 12 hours while inpatient  Class I obesity  Body mass index is 32.06 kg/m.  Complicates all facets  of care.  DVT prophylaxis: SCDs Start: 07/05/23 2338    Code Status: Full Code Family Communication: Updated daughter present at bedside this morning  Disposition Plan:  Level of care: Telemetry Medical Status is: Inpatient Remains inpatient appropriate because: Anticipate need of surgical management for large bowel obstruction per recommendation of gastroenterology today,    Consultants:  General surgery [facility] gastroenterology Cardiology - signed off 11/9  Procedures:  TTE Colonoscopy 07/08/2023, Dr. Lorenso Quarry  Antimicrobials:  None   Subjective: Patient seen examined bedside, resting calmly.  Lying in bed.  Just returned from endoscopy suite following attempted colonoscopy with that was unsuccessful.  Discussed with GI, Dr.  Lorenso Quarry who recommended surgical management given failure to progress with likely stricture complicating bowel obstruction.  Patient currently denies any pain, but just had anesthesia.  Reports was having abdominal discomfort prior to colonoscopy this morning.  Discussed with patient and daughter regarding repeat enema today to see if has further symptom relief.  But ultimately will likely need surgical intervention per GI.  No other specific complaints or concerns at this time.  Denies headache, no visual changes, no chest pain, no palpitations, no shortness of breath, no fever/chills/night sweats, no nausea/vomiting, no cough/congestion, no focal weakness, no fatigue, no paresthesias.  No acute events overnight per nursing staff.  Objective: Vitals:   07/08/23 0735 07/08/23 0818 07/08/23 0830 07/08/23 0845  BP: (!) 173/57 (!) 114/57 (!) 134/46 (!) 141/45  Pulse: 91 81 80 78  Resp: (!) 22 16 19  (!) 22  Temp: 98.6 F (37 C) 97.7 F (36.5 C)  (!) 97.4 F (36.3 C)  TempSrc: Temporal     SpO2: 92% 96% 94% 93%  Weight: 82.1 kg     Height: 5\' 3"  (1.6 m)       Intake/Output Summary (Last 24 hours) at 07/08/2023 1043 Last data filed at 07/08/2023  1610 Gross per 24 hour  Intake 1557.31 ml  Output --  Net 1557.31 ml   Filed Weights   07/05/23 1508 07/08/23 0735  Weight: 82.1 kg 82.1 kg    Examination:  Physical Exam: GEN: NAD, alert and oriented x 3, obese HEENT: NCAT, PERRL, EOMI, sclera clear, MMM PULM: CTAB w/o wheezes/crackles, normal respiratory effort, on room air CV: RRR w/o M/G/R GI: abd soft, + distention, no appreciable bowel sounds, no R/G/M MSK: no peripheral edema, moves all extremity dependently NEURO: No focal neurological deficits PSYCH: Agitated mood, normal affect Integumentary: dry/intact, no rashes or wounds    Data Reviewed: I have personally reviewed following labs and imaging studies  CBC: Recent Labs  Lab 07/05/23 1514 07/06/23 0627 07/07/23 0557 07/08/23 0600  WBC 18.4* 15.8* 17.3* 21.1*  HGB 15.0 13.5 14.3 13.1  HCT 43.5 39.7 42.4 38.7  MCV 89.3 90.0 90.8 89.8  PLT 325 269 305 293   Basic Metabolic Panel: Recent Labs  Lab 07/05/23 1514 07/06/23 0627 07/07/23 0557 07/08/23 0600  NA 134* 136 136 136  K 4.1 3.8 4.2 3.8  CL 101 108 104 105  CO2 25 21* 23 21*  GLUCOSE 120* 134* 143* 132*  BUN 17 15 12 13   CREATININE 1.13* 0.93 0.96 0.85  CALCIUM 9.9 8.4* 8.8* 8.6*  MG  --  1.8 1.7 1.9  PHOS  --  3.1  --   --    GFR: Estimated Creatinine Clearance: 50 mL/min (by C-G formula based on SCr of 0.85 mg/dL). Liver Function Tests: Recent Labs  Lab 07/05/23 1514 07/06/23 0627  AST 17 14*  ALT 9 11  ALKPHOS 90 80  BILITOT 0.7 0.8  PROT 7.5 5.6*  ALBUMIN 3.9 2.6*   Recent Labs  Lab 07/05/23 1514  LIPASE <10*   No results for input(s): "AMMONIA" in the last 168 hours. Coagulation Profile: No results for input(s): "INR", "PROTIME" in the last 168 hours. Cardiac Enzymes: No results for input(s): "CKTOTAL", "CKMB", "CKMBINDEX", "TROPONINI" in the last 168 hours. BNP (last 3 results) No results for input(s): "PROBNP" in the last 8760 hours. HbA1C: No results for  input(s): "HGBA1C" in the last 72 hours. CBG: No results for input(s): "GLUCAP" in the last 168 hours. Lipid Profile: No results for input(s): "CHOL", "HDL", "LDLCALC", "TRIG", "CHOLHDL", "LDLDIRECT" in the last 72 hours. Thyroid Function Tests: No results for input(s): "TSH", "T4TOTAL", "FREET4", "T3FREE", "THYROIDAB" in the last 72 hours. Anemia Panel: No results for input(s): "VITAMINB12", "FOLATE", "FERRITIN", "TIBC", "IRON", "RETICCTPCT" in the last 72 hours. Sepsis Labs: No results for input(s): "PROCALCITON", "LATICACIDVEN" in the last 168 hours.  No results found for this or any previous visit (from the past 240 hour(s)).       Radiology Studies: ECHOCARDIOGRAM COMPLETE  Result Date: 07/07/2023    ECHOCARDIOGRAM REPORT   Patient Name:   [name] [name] Date of Exam: 07/07/2023 Medical Rec #:  [id number]           Height:       63.0 in Accession #:    [id number]          Weight:       181.0 lb Date of Birth:  02-09-1941          BSA:          1.853 m Patient Age:    82 years            BP:           143/55 mmHg Patient Gender: F                   HR:           88 bpm. Exam Location:  Inpatient Procedure: 2D Echo, Color Doppler, Cardiac Doppler and Strain Analysis Indications:    Preoperative Evaluation  History:        Patient has no prior history of Echocardiogram examinations. CHF                 and Heart Transplant, Arrythmias:Atrial Fibrillation; Risk                 Factors:Former Smoker and Hypertension.  Sonographer:    Raeford Razor Referring Phys: [id number] SHENG L HALEY  Sonographer Comments: Image acquisition challenging due to patient body habitus. IMPRESSIONS  1. Left ventricular ejection fraction, by estimation, is 60 to 65%. The left ventricle has normal function. The left ventricle has no regional wall motion abnormalities. Left ventricular diastolic parameters were normal. The average left ventricular global longitudinal strain is -21.2 %. The global longitudinal strain is  normal.  2. Right ventricular systolic function is hyperdynamic. The right ventricular size is normal. Tricuspid regurgitation signal is inadequate for assessing PA pressure.  3. Left atrial size was severely dilated.  4. Right atrial size was severely dilated.  5. The mitral valve is normal in structure. Mild mitral valve regurgitation.  6. The aortic valve was not well visualized. Aortic valve regurgitation is moderate to severe. No aortic stenosis is present. Aortic regurgitation PHT measures 276  msec. FINDINGS  Left Ventricle: Left ventricular ejection fraction, by estimation, is 60 to 65%. The left ventricle has normal function. The left ventricle has no regional wall motion abnormalities. The average left ventricular global longitudinal strain is -21.2 %. The global longitudinal strain is normal. The left ventricular internal cavity size was normal in size. There is no left ventricular hypertrophy. Left ventricular diastolic parameters were normal. Right Ventricle: The right ventricular size is normal. No increase in right ventricular wall thickness. Right ventricular systolic function is hyperdynamic. Tricuspid regurgitation signal is inadequate for assessing PA pressure. Left [facility]: [place] appear large due to transplant. Left atrial size was severely dilated. Right [facility]: Right atrial size was severely dilated. Pericardium: There is no evidence of pericardial effusion. Mitral Valve: The mitral valve is normal in structure. There is mild thickening of the anterior mitral valve leaflet(s). Mild mitral valve regurgitation, with eccentric laterally directed jet. Tricuspid Valve: The tricuspid valve is normal in structure. Tricuspid valve regurgitation is not demonstrated. Aortic Valve: The aortic valve was not well visualized. Aortic valve regurgitation is moderate to severe. Aortic regurgitation PHT measures 276 msec. No aortic stenosis is present. Aortic valve peak gradient measures 17.6 mmHg. Pulmonic  Valve: The pulmonic valve was grossly normal. Pulmonic valve regurgitation is not visualized. No evidence of pulmonic stenosis. Aorta: The aortic root and ascending aorta are structurally normal, with no evidence of dilitation. IAS/Shunts: The interatrial septum was not well visualized.  LEFT VENTRICLE PLAX 2D LVIDd:         4.40 cm LVIDs:         3.00 cm   2D Longitudinal Strain LV PW:         0.90 cm   2D Strain GLS Avg:     -21.2 % LV IVS:        1.00 cm LVOT diam:     1.70 cm LVOT Area:     2.27 cm  RIGHT VENTRICLE RV S prime:     14.10 cm/s TAPSE (M-mode): 1.6 cm LEFT ATRIUM             Index        RIGHT ATRIUM           Index LA diam:        6.00 cm 3.24 cm/m   RA Area:     14.60 cm LA Vol (A2C):   66.2 ml 35.72 ml/m  RA Volume:   39.50 ml  21.31 ml/m LA Vol (A4C):   41.2 ml 22.23 ml/m LA Biplane Vol: 54.8 ml 29.57 ml/m  AORTIC VALVE AV Area (Vmax): 1.52 cm AV Vmax:        210.00 cm/s AV Peak Grad:   17.6 mmHg LVOT Vmax:      141.00 cm/s AI PHT:         276 msec  AORTA Ao Root diam: 2.60 cm Ao Asc diam:  2.90 cm MITRAL VALVE MV Area (PHT): 5.54 cm     SHUNTS MV Decel Time: 137 msec     Systemic Diam: 1.70 cm MV E velocity: 119.00 cm/s MV A velocity: 88.20 cm/s MV E/A ratio:  1.35 Mihai Croitoru MD Electronically signed by Thurmon Fair MD Signature Date/Time: 07/07/2023/5:38:00 PM    Final    DG Abd Portable 1V  Result Date: 07/07/2023 CLINICAL DATA:  Colonic distention. EXAM: PORTABLE ABDOMEN - 1 VIEW COMPARISON:  CT abdomen and pelvis dated 07/05/2023. FINDINGS: A dilated loop of large bowel is seen in the right  hemiabdomen. This appears similar to prior CT abdomen pelvis dated 07/05/2023. There is a small right pleural effusion with associated atelectasis/airspace disease. No radio-opaque calculi or other significant radiographic abnormality are seen. IMPRESSION: 1. Dilated loop of large bowel in the right hemiabdomen. 2. Small right pleural effusion with associated atelectasis/airspace  disease. Electronically Signed   By: Romona Curls M.D.   On: 07/07/2023 14:21        Scheduled Meds:  diltiazem  120 mg Oral BID   fluticasone furoate-vilanterol  1 puff Inhalation Daily   levothyroxine  50 mcg Oral Q0600   losartan  50 mg Oral Daily   mycophenolate  250 mg Oral BID   pantoprazole (PROTONIX) IV  40 mg Intravenous Q12H   polyethylene glycol  17 g Oral Q4H   sorbitol, milk of mag, mineral oil, glycerin (SMOG) enema  960 mL Rectal Once   tacrolimus  0.5 mg Oral BID   Continuous Infusions:  sodium chloride 75 mL/hr at 07/07/23 2046     LOS: 3 days    Time spent: 53 minutes spent on chart review, discussion with nursing staff, consultants, updating family and interview/physical exam; more than 50% of that time was spent in counseling and/or coordination of care.    Alvira Philips [place], DO [facility] Available via Epic secure chat 7am-7pm After these hours, please refer to coverage provider listed on [email or web address] 07/08/2023, 10:43 AM

## 2023-07-08 NOTE — Plan of Care (Signed)

## 2023-07-08 NOTE — Transfer of Care (Signed)
Immediate Anesthesia Transfer of Care Note  Patient: [name] [name]  Procedure(s) Performed: COLONOSCOPY  Patient Location: PACU  Anesthesia Type:MAC  Level of Consciousness: sedated  Airway & Oxygen Therapy: Patient Spontanous Breathing and Patient connected to nasal cannula oxygen  Post-op Assessment: Report given to RN and Post -op Vital signs reviewed and stable  Post vital signs: Reviewed and stable  Last Vitals:  Vitals Value Taken Time  BP    Temp    Pulse 80 07/08/23 0820  Resp 23 07/08/23 0820  SpO2 96 % 07/08/23 0820  Vitals shown include unfiled device data.  Last Pain:  Vitals:   07/08/23 0735  TempSrc: Temporal  PainSc: 4       Patients Stated Pain Goal: 0 (07/07/23 1719)  Complications: No notable events documented.

## 2023-07-09 ENCOUNTER — Encounter (HOSPITAL_COMMUNITY): Payer: Self-pay | Admitting: Internal Medicine

## 2023-07-09 DIAGNOSIS — K56609 Unspecified intestinal obstruction, unspecified as to partial versus complete obstruction: Secondary | ICD-10-CM | POA: Diagnosis not present

## 2023-07-09 MED ORDER — POLYETHYLENE GLYCOL 3350 17 G PO PACK
17.0000 g | PACK | Freq: Three times a day (TID) | ORAL | Status: DC
  Administered 2023-07-09 – 2023-07-11 (×6): 17 g via ORAL
  Filled 2023-07-09 (×6): qty 1

## 2023-07-09 MED ORDER — FLEET ENEMA RE ENEM
1.0000 | ENEMA | Freq: Two times a day (BID) | RECTAL | Status: DC
  Administered 2023-07-09 – 2023-07-11 (×4): 1 via RECTAL
  Filled 2023-07-09 (×5): qty 1

## 2023-07-09 MED ORDER — BOOST / RESOURCE BREEZE PO LIQD CUSTOM
1.0000 | Freq: Three times a day (TID) | ORAL | Status: DC
  Administered 2023-07-09 – 2023-07-12 (×7): 1 via ORAL
  Filled 2023-07-09: qty 1

## 2023-07-09 MED ORDER — MAGNESIUM OXIDE -MG SUPPLEMENT 400 (240 MG) MG PO TABS
400.0000 mg | ORAL_TABLET | Freq: Two times a day (BID) | ORAL | Status: DC
  Administered 2023-07-09 – 2023-07-15 (×12): 400 mg via ORAL
  Filled 2023-07-09 (×12): qty 1

## 2023-07-09 MED ORDER — KCL IN DEXTROSE-NACL 20-5-0.45 MEQ/L-%-% IV SOLN
INTRAVENOUS | Status: AC
  Filled 2023-07-09 (×2): qty 1000

## 2023-07-09 NOTE — Progress Notes (Signed)
Mobility Specialist Progress Note:   07/09/23 1400  Mobility  Activity Ambulated with assistance in hallway  Level of Assistance Standby assist, set-up cues, supervision of patient - no hands on  Assistive Device Other (Comment) (IV Pole)  Distance Ambulated (ft) 140 ft  Activity Response Tolerated well  Mobility Referral Yes  $Mobility charge 1 Mobility  Mobility Specialist Start Time (ACUTE ONLY) 1400  Mobility Specialist Stop Time (ACUTE ONLY) 1420  Mobility Specialist Time Calculation (min) (ACUTE ONLY) 20 min   Pt agreeable to mobility session despite stating she just ambulated in hallway with visitor. Required no physical assistance throughout ambulation while pushing IV pole. No c/o throughout. Pt back in chair with all needs met.   [name] [name] Mobility Specialist Please contact via SecureChat or  Rehab office at [phone]

## 2023-07-09 NOTE — Plan of Care (Signed)

## 2023-07-09 NOTE — Progress Notes (Signed)
1 Day Post-Op   Subjective/Chief Complaint: Patient denies abdominal pain.  She is having bowel movements.   Objective: Vital signs in last 24 hours: Temp:  [98.3 F (36.8 C)-98.8 F (37.1 C)] 98.8 F (37.1 C) (11/11 0750) Pulse Rate:  [81-88] 82 (11/11 0834) Resp:  [16-24] 16 (11/11 0750) BP: (143-162)/(52-60) 153/60 (11/11 0915) SpO2:  [92 %-98 %] 98 % (11/11 0834) Last BM Date : 07/08/23  Intake/Output from previous day: 11/10 0701 - 11/11 0700 In: 1107.4 [I.V.:1107.4] Out: -  Intake/Output this shift: No intake/output data recorded.  Abdomen: Mild distention but soft nontender without rebound or guarding  Lab Results:  Recent Labs    07/07/23 0557 07/08/23 0600  WBC 17.3* 21.1*  HGB 14.3 13.1  HCT 42.4 38.7  PLT 305 293   BMET Recent Labs    07/07/23 0557 07/08/23 0600  NA 136 136  K 4.2 3.8  CL 104 105  CO2 23 21*  GLUCOSE 143* 132*  BUN 12 13  CREATININE 0.96 0.85  CALCIUM 8.8* 8.6*   PT/INR No results for input(s): "LABPROT", "INR" in the last 72 hours. ABG No results for input(s): "PHART", "HCO3" in the last 72 hours.  Invalid input(s): "PCO2", "PO2"  Studies/Results: DG Abd Portable 1V  Result Date: 07/07/2023 CLINICAL DATA:  Colonic distention. EXAM: PORTABLE ABDOMEN - 1 VIEW COMPARISON:  CT abdomen and pelvis dated 07/05/2023. FINDINGS: A dilated loop of large bowel is seen in the right hemiabdomen. This appears similar to prior CT abdomen pelvis dated 07/05/2023. There is a small right pleural effusion with associated atelectasis/airspace disease. No radio-opaque calculi or other significant radiographic abnormality are seen. IMPRESSION: 1. Dilated loop of large bowel in the right hemiabdomen. 2. Small right pleural effusion with associated atelectasis/airspace disease. Electronically Signed   By: Romona Curls M.D.   On: 07/07/2023 14:21    Anti-infectives: Anti-infectives (From admission, onward)    None        Assessment/Plan: Colonic obstruction unclear etiology S/p OHT, on cellcept/prograf H/o VTE- Holding eliquis, ok for lovenox or heparin gtt hyperglycemia   Colonoscopy performed.  Information from a quite minimal due to stool burden.  Patient's daughter is contacted her transplant team at [facility] and they had to hold her that they are comfortable with her being treated here if she needs surgery.  The patient family would also like to stay here instead of being transferred.  Cardiology evaluation noted  GI medicine has told her they may consider prepping and rescoping her this week.  That would be helpful prior to surgery if possible.  Allow clear liquids today.  Discussed moderate to high risk for surgery with the patient and daughter is at the bedside.  Discussed partial colectomy with anastomosis versus partial colectomy end ileostomy.  Risk, benefits long-term quality of life issues as well as complications of bleeding, infection, death, worsening underlying medical problems, anastomotic leak, sepsis, pelvic abscess, injury to internal organs, and the need for the treatment center procedures reviewed.     No indications for emergent surgery, but will likely need stricture resected whether benign or malignant.    LOS: 4 days    Dortha Schwalbe MD  07/09/2023 Moderate complexity

## 2023-07-09 NOTE — Progress Notes (Addendum)
Patient called out at 2035 stating that she needed to use the restroom. CNA was in a room and unable to assist at that time. This nurse was in a room giving pain medication and troubleshooting the IV pump and told the secretary I would be in there as soon as I was finished. Went into patients room at 2044 and she stated that she felt dizzy and had just went to the restroom by herself due to staff taking too long. Explained to patient that I was in another patients room and got to her as quickly as I could. Patient stated daughter was calling the nurses station due to [place] taking too long to get into the room. Took patients vitals and vitals were normal for patient. Patient states that she is feeling better now that she is back in bed. Turned patients bed alarm on for fall risk. Attempted to call daughter Alvino Chapel to update, left a voicemail.   2143: Patients daughter Alvino Chapel updated.

## 2023-07-09 NOTE — Progress Notes (Signed)
PROGRESS NOTE    [name] [name]  RKY:[id number] DOB: 07/16/1941 DOA: 07/05/2023 PCP: [name] [name]., MD    Brief Narrative:   [name] [name] is a 82 y.o. female with past medical history significant for HTN, hypothyroidism, history of heart transplant followed by [facility] since 2009, history of parathyroidectomy, OSA not on CPAP, history of DVT on Eliquis, history of C. difficile colitis who presented to [facility] ED on 11/7 by direction of her PCP with 3-day history of progressive abdominal pain associated with nausea and obstipation with concerns of bowel obstruction.  In the ED, temperature 98.2 F, HR 95, RR 16, BP 156/67, SpO2 95% on room air.  WBC 18.4, hemoglobin 15.0, platelet count 325.  Sodium 134, potassium 4.1, chloride 101, CO2 25, glucose 120, BUN 17, creatinine 1.13.  Lipase less than 10.  AST 17, ALT 9, total bilirubin 0.7.  Urinalysis unrevealing.  Chest x-ray with no acute cardiopulmonary disease process.  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  General surgery consulted.  Patient was started on IV fluids and kept NPO.  TRH consulted for admission and patient was transferred to [facility] for further evaluation and management.  Assessment & Plan:   Large bowel obstruction Patient presenting to the ED by discretion of her PCP after findings/concern of bowel obstruction noted on outpatient x-ray.  Patient  reports progressive abdominal pain associate with nausea and obstipation over the last 3 days.  Patient was afebrile with elevated WBC count of 18.4.  CT abdomen/pelvis with signs of proximal colonic obstruction with abnormal dilation of the cecum/proximal colon up to the level of the mid transverse colon, dilated proximal colon measuring up to 7.3 cm with wall thickening/surrounding soft tissue stranding with abrupt transition mid/distal colon with desiccated stool ball at transition point measuring 5.6 x 4.8 cm with underlying colonic stricture either benign/malignant suspected.  Patient was seen by general surgery, recommended GI consultation for consideration of colonoscopy.  Patient underwent colonoscopy on 07/08/2023 that was unsuccessful. -- General Surgery/[facility] following, appreciate assistance -- Clear liquid diet -- NS at 75 mL/h -- Fleet enema twice daily -- MiraLAX 3 times daily -- GI may attempt colonoscopy later this week, no emergent need for surgical invention at this time per general surgery today  Leukocytosis WBC count elevated 18.4 on admission, likely reactive versus hemoconcentration in the setting of poor oral intake in the days preceding hospitalization due to large bowel obstruction. -- WBC 18.4>15.8>21.1 -- Continue IV fluid as above -- CBC daily  Essential hypertension Home regimen includes diltiazem 120 mg p.o. twice daily, losartan 100 mg p.o. twice daily -- Diltiazem 120 mg p.o. twice daily -- Hold home losartan for now  Hypothyroidism -- Levothyroxine 50 mcg p.o. daily  History of heart transplant Follows with [facility] since 2009.  Seen by [facility] cardiology while inpatient. -- Mycophenolate 250 mg p.o. twice daily -- Tacrolimus 0.5 mg p.o. twice daily -- Magnesium oxide  History of DVT -- Holding home Eliquis for anticipated need of surgical intervention versus consideration of repeat  colonoscopy for large bowel obstruction  Anxiety --  Clonazepam 0.5 m p.o. nightly as needed anxiety  GERD On omeprazole outpatient. -- Continue Protonix 40 mg IV every 12 hours while inpatient  Class I obesity Body mass index is 32.06 kg/m.  Complicates all facets of care.  DVT prophylaxis: SCDs Start: 07/05/23 2338    Code Status: Full Code Family Communication: Updated daughter present at bedside this morning  Disposition Plan:  Level of care: Telemetry Medical Status is: Inpatient Remains inpatient appropriate because: Anticipate need of surgical management for large bowel obstruction     Consultants:  General surgery [facility] gastroenterology Cardiology - signed off 11/9  Procedures:  TTE Colonoscopy 07/08/2023, Dr. Lorenso Quarry  Antimicrobials:  None   Subjective: Patient seen examined bedside, resting calmly.  Lying in bed.  Daughter present at bedside.  Daughter discussed with patient's outpatient cardiologist at [facility], okay to remain at [facility] if needed surgical intervention.  Seen by GI this morning, added Fleet enema twice daily and MiraLAX 3 times daily as they may attempt repeat colonoscopy later this week.  Seen by general surgery, no indication for acute surgical intervention today and started on a clear liquid diet.  No other specific complaints or concerns at this time.  Denies headache, no visual changes, no chest pain, no palpitations, no shortness of breath, no fever/chills/night sweats, no nausea/vomiting, no cough/congestion, no focal weakness, no fatigue, no paresthesias.  No acute events overnight per nursing staff.  Objective: Vitals:   07/09/23 0518 07/09/23 0750 07/09/23 0834 07/09/23 0915  BP: (!) 145/56 (!) 153/60  (!) 153/60  Pulse: 81 81 82   Resp: 16 16    Temp: 98.3 F (36.8 C) 98.8 F (37.1 C)    TempSrc: Oral Oral    SpO2: 92% 96% 98%   Weight:      Height:        Intake/Output Summary (Last 24 hours) at 07/09/2023 1149 Last data filed at 07/09/2023 0300 Gross per 24 hour   Intake 307.42 ml  Output --  Net 307.42 ml   Filed Weights   07/05/23 1508 07/08/23 0735  Weight: 82.1 kg 82.1 kg    Examination:  Physical Exam: GEN: NAD, alert and oriented x 3, obese HEENT: NCAT, PERRL, EOMI, sclera clear, MMM PULM: CTAB w/o wheezes/crackles, normal respiratory effort, on room air CV: RRR w/o M/G/R GI: abd soft, + distention, + faint bowel sounds, no R/G/M MSK: no peripheral edema, moves all extremity dependently NEURO: No focal neurological deficits PSYCH: Agitated mood, normal affect Integumentary: dry/intact, no rashes or wounds    Data Reviewed: I have personally reviewed following labs and imaging studies  CBC: Recent Labs  Lab 07/05/23 1514 07/06/23 0627 07/07/23 0557 07/08/23 0600  WBC 18.4* 15.8* 17.3* 21.1*  HGB 15.0 13.5 14.3 13.1  HCT 43.5 39.7 42.4 38.7  MCV 89.3 90.0 90.8 89.8  PLT 325 269 305 293   Basic Metabolic Panel: Recent Labs  Lab 07/05/23 1514 07/06/23 0627 07/07/23 0557 07/08/23 0600  NA 134* 136 136 136  K 4.1 3.8 4.2 3.8  CL 101 108 104 105  CO2 25 21* 23 21*  GLUCOSE 120* 134* 143* 132*  BUN 17 15 12 13   CREATININE 1.13* 0.93 0.96 0.85  CALCIUM 9.9 8.4* 8.8* 8.6*  MG  --  1.8 1.7 1.9  PHOS  --  3.1  --   --    GFR: Estimated Creatinine Clearance: 50 mL/min (by C-G formula based on SCr  of 0.85 mg/dL). Liver Function Tests: Recent Labs  Lab 07/05/23 1514 07/06/23 0627  AST 17 14*  ALT 9 11  ALKPHOS 90 80  BILITOT 0.7 0.8  PROT 7.5 5.6*  ALBUMIN 3.9 2.6*   Recent Labs  Lab 07/05/23 1514  LIPASE <10*   No results for input(s): "AMMONIA" in the last 168 hours. Coagulation Profile: No results for input(s): "INR", "PROTIME" in the last 168 hours. Cardiac Enzymes: No results for input(s): "CKTOTAL", "CKMB", "CKMBINDEX", "TROPONINI" in the last 168 hours. BNP (last 3 results) No results for input(s): "PROBNP" in the last 8760 hours. HbA1C: No results for input(s): "HGBA1C" in the last 72  hours. CBG: No results for input(s): "GLUCAP" in the last 168 hours. Lipid Profile: No results for input(s): "CHOL", "HDL", "LDLCALC", "TRIG", "CHOLHDL", "LDLDIRECT" in the last 72 hours. Thyroid Function Tests: No results for input(s): "TSH", "T4TOTAL", "FREET4", "T3FREE", "THYROIDAB" in the last 72 hours. Anemia Panel: No results for input(s): "VITAMINB12", "FOLATE", "FERRITIN", "TIBC", "IRON", "RETICCTPCT" in the last 72 hours. Sepsis Labs: No results for input(s): "PROCALCITON", "LATICACIDVEN" in the last 168 hours.  No results found for this or any previous visit (from the past 240 hour(s)).       Radiology Studies: No results found.      Scheduled Meds:  diltiazem  120 mg Oral BID   feeding supplement  1 Container Oral TID WC   fluticasone furoate-vilanterol  1 puff Inhalation Daily   levothyroxine  50 mcg Oral Q0600   losartan  50 mg Oral Daily   magnesium oxide  400 mg Oral BID   mycophenolate  250 mg Oral BID   pantoprazole (PROTONIX) IV  40 mg Intravenous Q12H   polyethylene glycol  17 g Oral Q8H   sodium phosphate  1 enema Rectal BID   tacrolimus  0.5 mg Oral BID   Continuous Infusions:  dextrose 5 % and 0.45 % NaCl with KCl 20 mEq/L 75 mL/hr at 07/09/23 0913     LOS: 4 days    Time spent: 53 minutes spent on chart review, discussion with nursing staff, consultants, updating family and interview/physical exam; more than 50% of that time was spent in counseling and/or coordination of care.    Alvira Philips [place], DO [facility] Available via Epic secure chat 7am-7pm After these hours, please refer to coverage provider listed on [email or web address] 07/09/2023, 11:49 AM

## 2023-07-09 NOTE — Progress Notes (Signed)
[facility] Gastroenterology Progress Note  [name] [name] 82 y.o. 08/25/1941  CC: Colonic obstruction   Subjective: Patient seen and examined at bedside.  Not passing any stool.  Not passing any gas.  Denies any nausea or vomiting.  Able to tolerate water at this time.  ROS : Negative for chest pain and shortness of breath.   Objective: Vital signs in last 24 hours: Vitals:   07/09/23 0834 07/09/23 0915  BP:  (!) 153/60  Pulse: 82   Resp:    Temp:    SpO2: 98%     Physical Exam:  General:  Alert, cooperative, no distress, appears stated age  Head:  Normocephalic, without obvious abnormality, atraumatic  Eyes:  , EOM's intact,   Lungs:   No visible respiratory distress  Heart:    Abdomen:   Abdomen is soft, nontender, nondistended, bowel sound present, no peritoneal signs  Extremities: Extremities normal, atraumatic, no  edema  Pulses: 2+ and symmetric    Lab Results: Recent Labs    07/07/23 0557 07/08/23 0600  NA 136 136  K 4.2 3.8  CL 104 105  CO2 23 21*  GLUCOSE 143* 132*  BUN 12 13  CREATININE 0.96 0.85  CALCIUM 8.8* 8.6*  MG 1.7 1.9   No results for input(s): "AST", "ALT", "ALKPHOS", "BILITOT", "PROT", "ALBUMIN" in the last 72 hours. Recent Labs    07/07/23 0557 07/08/23 0600  WBC 17.3* 21.1*  HGB 14.3 13.1  HCT 42.4 38.7  MCV 90.8 89.8  PLT 305 293   No results for input(s): "LABPROT", "INR" in the last 72 hours.    Assessment/Plan: -Colonic obstruction with possible transition point in the mid transverse colon concerning for stricture versus malignancy.  Patient with family history of colon cancer.  Last colonoscopy in 2018 did showed some transverse colonic inflammations but biopsies were negative.  -History of heart transplant in 2009 at [facility].  Recommendations -------------------------- -Detail discussed with patient and family at bedside.  MiraLAX has been ordered every 4 hours but patient is not taking it as she is afraid to  take MiraLAX .  Importance of taking MiraLAX if she can tolerate discussed with the patient.  She is currently not having any nausea or vomiting.  Abdominal exam is benign with soft abdomen.  I think she would be able to tolerate MiraLAX 3 times a day.  I will change order to every 8 hours and if he tolerates it we can change it to every 6 hours.  Fleets enema twice a day.  Continue other supportive care.   Decrease narcotics.   Okay to have clear liquid diet to help stimulate intestinal motility.   Also recommend ambulation with assistance to help out with intestinal motility.  GI will follow.  Kathi Der MD, FACP 07/09/2023, 9:57 AM  Contact #  [phone]

## 2023-07-09 NOTE — Progress Notes (Signed)
   07/09/23 1352  TOC Brief Assessment  Insurance and Status Reviewed  Patient has primary care physician Yes  Home environment has been reviewed home  Prior level of function: independent  Prior/Current Home Services No current home services  Social Determinants of Health Reivew SDOH reviewed no interventions necessary  Readmission risk has been reviewed Yes  Transition of care needs no transition of care needs at this time     partial colectomy with anastomosis versus partial colectomy end ileostomy.   TOC will continue to follow for discharge needs

## 2023-07-09 NOTE — Care Management Important Message (Signed)
Important Message  Patient Details  Name: [name] [name] MRN: [id number] Date of Birth: 1941/08/19   Important Message Given:  Yes - Medicare IM     [name] 07/09/2023, 2:50 PM

## 2023-07-09 NOTE — Progress Notes (Signed)
pt stated she wanted to hold off on the enema for right now. However pt did drink her miralax.

## 2023-07-10 ENCOUNTER — Inpatient Hospital Stay (HOSPITAL_COMMUNITY): Payer: Medicare Other

## 2023-07-10 DIAGNOSIS — K56609 Unspecified intestinal obstruction, unspecified as to partial versus complete obstruction: Secondary | ICD-10-CM | POA: Diagnosis not present

## 2023-07-10 LAB — CBC
HCT: 37.1 % (ref 36.0–46.0)
Hemoglobin: 12.7 g/dL (ref 12.0–15.0)
MCH: 30 pg (ref 26.0–34.0)
MCHC: 34.2 g/dL (ref 30.0–36.0)
MCV: 87.7 fL (ref 80.0–100.0)
Platelets: 293 10*3/uL (ref 150–400)
RBC: 4.23 MIL/uL (ref 3.87–5.11)
RDW: 12.9 % (ref 11.5–15.5)
WBC: 14.2 10*3/uL — ABNORMAL HIGH (ref 4.0–10.5)
nRBC: 0 % (ref 0.0–0.2)

## 2023-07-10 LAB — MAGNESIUM: Magnesium: 1.3 mg/dL — ABNORMAL LOW (ref 1.7–2.4)

## 2023-07-10 LAB — BASIC METABOLIC PANEL
Anion gap: 10 (ref 5–15)
BUN: 6 mg/dL — ABNORMAL LOW (ref 8–23)
CO2: 20 mmol/L — ABNORMAL LOW (ref 22–32)
Calcium: 8.3 mg/dL — ABNORMAL LOW (ref 8.9–10.3)
Chloride: 106 mmol/L (ref 98–111)
Creatinine, Ser: 0.69 mg/dL (ref 0.44–1.00)
GFR, Estimated: >60 mL/min (ref >60)
Glucose, Bld: 163 mg/dL — ABNORMAL HIGH (ref 70–99)
Potassium: 3 mmol/L — ABNORMAL LOW (ref 3.5–5.1)
Sodium: 136 mmol/L (ref 135–145)

## 2023-07-10 MED ORDER — MAGNESIUM SULFATE 4 GM/100ML IV SOLN
4.0000 g | Freq: Once | INTRAVENOUS | Status: AC
  Administered 2023-07-10: 4 g via INTRAVENOUS
  Filled 2023-07-10: qty 100

## 2023-07-10 MED ORDER — POTASSIUM CHLORIDE CRYS ER 20 MEQ PO TBCR
30.0000 meq | EXTENDED_RELEASE_TABLET | ORAL | Status: AC
  Administered 2023-07-10 (×3): 30 meq via ORAL
  Filled 2023-07-10 (×3): qty 1

## 2023-07-10 MED ORDER — KCL IN DEXTROSE-NACL 20-5-0.45 MEQ/L-%-% IV SOLN
INTRAVENOUS | Status: AC
  Filled 2023-07-10 (×2): qty 1000

## 2023-07-10 MED ORDER — OXYCODONE HCL 5 MG PO TABS
5.0000 mg | ORAL_TABLET | ORAL | Status: DC | PRN
  Administered 2023-07-13 – 2023-07-14 (×2): 5 mg via ORAL
  Filled 2023-07-10 (×2): qty 1

## 2023-07-10 MED ORDER — VENLAFAXINE HCL ER 75 MG PO CP24
75.0000 mg | ORAL_CAPSULE | Freq: Every day | ORAL | Status: DC
  Administered 2023-07-11 – 2023-07-15 (×5): 75 mg via ORAL
  Filled 2023-07-10 (×5): qty 1

## 2023-07-10 NOTE — Progress Notes (Signed)
PROGRESS NOTE    [name] [name]  HQI:[id number] DOB: 08-Mar-1941 DOA: 07/05/2023 PCP: Cleatis Polka., MD    Brief Narrative:   [name] [name] is a 82 y.o. female with past medical history significant for HTN, hypothyroidism, history of heart transplant followed by [facility] since [facility], history of parathyroidectomy, OSA not on CPAP, history of DVT on Eliquis, history of C. difficile colitis who presented to [facility] ED on 11/7 by direction of her PCP with 3-day history of progressive abdominal pain associated with nausea and obstipation with concerns of bowel obstruction.  In the ED, temperature 98.2 F, HR 95, RR 16, BP 156/67, SpO2 95% on room air.  WBC 18.4, hemoglobin 15.0, platelet count 325.  Sodium 134, potassium 4.1, chloride 101, CO2 25, glucose 120, BUN 17, creatinine 1.13.  Lipase less than 10.  AST 17, ALT 9, total bilirubin 0.7.  Urinalysis unrevealing.  Chest x-ray with no acute cardiopulmonary disease process.  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  General surgery consulted.  Patient was started on IV fluids and kept NPO.  TRH consulted for admission and patient was transferred to [facility] for further evaluation and management.  Assessment & Plan:   Large bowel obstruction Patient presenting to the ED by discretion of her PCP after findings/concern of bowel obstruction noted on outpatient x-ray.  Patient  reports progressive abdominal pain associate with nausea and obstipation over the last 3 days.  Patient was afebrile with elevated WBC count of 18.4.  CT abdomen/pelvis with signs of proximal colonic obstruction with abnormal dilation of the cecum/proximal colon up to the level of the mid transverse colon, dilated proximal colon measuring up to 7.3 cm with wall thickening/surrounding soft tissue stranding with abrupt transition mid/distal colon with desiccated stool ball at transition point measuring 5.6 x 4.8 cm with underlying colonic stricture either benign/malignant suspected.  Patient was seen by general surgery, recommended GI consultation for consideration of colonoscopy.  Patient underwent colonoscopy on 07/08/2023 that was unsuccessful. -- General Surgery/[facility] following, appreciate assistance -- Full liquid diet -- NS at 75 mL/h -- Fleet enema twice daily -- MiraLAX 3 times daily -- GI may attempt colonoscopy later this [name], no emergent need for surgical invention at this time per general surgery today  Leukocytosis WBC count elevated 18.4 on admission, likely reactive versus hemoconcentration in the setting of poor oral intake in the days preceding hospitalization due to large bowel obstruction. -- WBC 18.4>15.8>21.1>14.2 -- Continue IV fluid as above -- CBC daily  Hypokalemia Hypomagnesemia Potassium 3.0, magnesium 1.3, will replete. -- Repeat electrolytes in a.m.  Essential hypertension Home regimen includes diltiazem 120 mg p.o. twice daily, losartan 100 mg p.o. twice daily -- Diltiazem 120 mg p.o. twice daily -- Hold home losartan for now  Hypothyroidism -- Levothyroxine 50 mcg p.o. daily  History of heart transplant Follows with [facility] since 2009.  Seen by [facility] cardiology while inpatient. -- Mycophenolate 250 mg p.o. twice daily -- Tacrolimus 0.5 mg p.o. twice daily -- Magnesium oxide  History of DVT --  Holding home Eliquis for anticipated need  of surgical intervention versus consideration of repeat colonoscopy for large bowel obstruction  Anxiety -- Clonazepam 0.5 m p.o. nightly as needed anxiety  GERD On omeprazole outpatient. -- Continue Protonix 40 mg IV every 12 hours while inpatient  Anxiety/depression -- Venlafaxine (substituted for home Pristiq)  Class I obesity Body mass index is 32.06 kg/m.  Complicates all facets of care.  DVT prophylaxis: SCDs Start: 07/05/23 2338    Code Status: Full Code Family Communication: Updated sister-in-law present at bedside this morning  Disposition Plan:  Level of care: Telemetry Medical Status is: Inpatient Remains inpatient appropriate because: Anticipate need of surgical management for large bowel obstruction     Consultants:  General surgery [facility] gastroenterology Cardiology - signed off 11/9  Procedures:  TTE Colonoscopy 07/08/2023, Dr. Lorenso Quarry  Antimicrobials:  None   Subjective: Patient seen examined bedside, resting calmly.  Lying in bed.  Sister in law present at bedside.  Patient and daughter-in-law with multiple questions this morning.  Seen by GI, Dr. Levora Angel this morning.  Continuing enemas and MiraLAX.  Requesting resumption of home Pristiq.  Replating electrolytes today.  Continues with abdominal pain, no nausea/vomiting with oral intake or with medication administration.  No other specific complaints or concerns at this time.  Denies headache, no visual changes, no chest pain, no palpitations, no shortness of breath, no fever/chills/night sweats, no nausea/vomiting, no cough/congestion, no focal weakness, no fatigue, no paresthesias.  No acute events overnight per nursing staff.  Objective: Vitals:   07/09/23 1946 07/10/23 0544 07/10/23 0821 07/10/23 0827  BP: (!) 148/56 (!) 140/58 (!) 155/58   Pulse: 84 87 89   Resp:  18 17   Temp: 98.2 F (36.8 C)  98.7 F (37.1 C)   TempSrc: Oral  Oral   SpO2:  95% 94% 95%  Weight:      Height:         Intake/Output Summary (Last 24 hours) at 07/10/2023 1218 Last data filed at 07/10/2023 0930 Gross per 24 hour  Intake 846.14 ml  Output --  Net 846.14 ml   Filed Weights   07/05/23 1508 07/08/23 0735  Weight: 82.1 kg 82.1 kg    Examination:  Physical Exam: GEN: NAD, alert and oriented x 3, obese HEENT: NCAT, PERRL, EOMI, sclera clear, MMM PULM: CTAB w/o wheezes/crackles, normal respiratory effort, on room air CV: RRR w/o M/G/R GI: abd soft, + distention, + faint bowel sounds, no R/G/M MSK: no peripheral edema, moves all extremity dependently NEURO: No focal neurological deficits PSYCH: Agitated mood, normal affect Integumentary: dry/intact, no rashes or wounds    Data Reviewed: I have personally reviewed following labs and imaging studies  CBC: Recent Labs  Lab 07/05/23 1514 07/06/23 0627 07/07/23 0557 07/08/23 0600 07/10/23 0627  WBC 18.4* 15.8* 17.3* 21.1* 14.2*  HGB 15.0 13.5 14.3 13.1 12.7  HCT 43.5 39.7 42.4 38.7 37.1  MCV 89.3 90.0 90.8 89.8 87.7  PLT 325 269 305 293 293   Basic Metabolic Panel: Recent Labs  Lab 07/05/23 1514 07/06/23 0627 07/07/23 0557 07/08/23 0600 07/10/23 0627  NA 134* 136 136 136 136  K 4.1 3.8 4.2 3.8 3.0*  CL 101 108 104 105 106  CO2 25 21* 23 21* 20*  GLUCOSE 120* 134* 143* 132* 163*  BUN 17 15 12 13  6*  CREATININE 1.13* 0.93 0.96 0.85 0.69  CALCIUM 9.9 8.4* 8.8* 8.6* 8.3*  MG  --  1.8 1.7 1.9 1.3*  PHOS  --  3.1  --   --   --    GFR: Estimated Creatinine Clearance: 53.1 mL/min (by C-G formula based on SCr of 0.69 mg/dL). Liver Function Tests: Recent Labs  Lab 07/05/23 1514 07/06/23 0627  AST 17 14*  ALT 9 11  ALKPHOS 90 80  BILITOT 0.7 0.8  PROT 7.5 5.6*  ALBUMIN 3.9 2.6*   Recent Labs  Lab 07/05/23 1514  LIPASE <10*   No results for input(s): "AMMONIA" in the last 168 hours. Coagulation Profile: No results for input(s): "INR", "PROTIME" in the last 168 hours. Cardiac Enzymes: No results for  input(s): "CKTOTAL", "CKMB", "CKMBINDEX", "TROPONINI" in the last 168 hours. BNP (last 3 results) No results for input(s): "PROBNP" in the last 8760 hours. HbA1C: No results for input(s): "HGBA1C" in the last 72 hours. CBG: No results for input(s): "GLUCAP" in the last 168 hours. Lipid Profile: No results for input(s): "CHOL", "HDL", "LDLCALC", "TRIG", "CHOLHDL", "LDLDIRECT" in the last 72 hours. Thyroid Function Tests: No results for input(s): "TSH", "T4TOTAL", "FREET4", "T3FREE", "THYROIDAB" in the last 72 hours. Anemia Panel: No results for input(s): "VITAMINB12", "FOLATE", "FERRITIN", "TIBC", "IRON", "RETICCTPCT" in the last 72 hours. Sepsis Labs: No results for input(s): "PROCALCITON", "LATICACIDVEN" in the last 168 hours.  No results found for this or any previous visit (from the past 240 hour(s)).       Radiology Studies: No results found.      Scheduled Meds:  diltiazem  120 mg Oral BID   feeding supplement  1 Container Oral TID WC   fluticasone furoate-vilanterol  1 puff Inhalation Daily   levothyroxine  50 mcg Oral Q0600   losartan  50 mg Oral Daily   magnesium oxide  400 mg Oral BID   mycophenolate  250 mg Oral BID   pantoprazole (PROTONIX) IV  40 mg Intravenous Q12H   polyethylene glycol  17 g Oral Q8H   potassium chloride  30 mEq Oral Q3H   sodium phosphate  1 enema Rectal BID   tacrolimus  0.5 mg Oral BID   [START ON 07/11/2023] venlafaxine XR  75 mg Oral Q breakfast   Continuous Infusions:  magnesium sulfate bolus IVPB 4 g (07/10/23 1052)     LOS: 5 days    Time spent: 53 minutes spent on chart review, discussion with nursing staff, consultants, updating family and interview/physical exam; more than 50% of that time was spent in counseling and/or coordination of care.    [name] Philips [place], DO [facility] Available via Epic secure chat 7am-7pm After these hours, please refer to coverage provider listed on [email or web address] 07/10/2023, 12:18 PM

## 2023-07-10 NOTE — Progress Notes (Signed)
[facility] Gastroenterology Progress Note  [name] [name] 82 y.o. 05-14-1941  CC: Colonic obstruction   Subjective: Patient seen and examined at bedside.  Patient is currently having small bowel movements with use of fleets enema and MiraLAX.  Continues to have abdominal pain but denies any nausea vomiting.   ROS : Negative for chest pain and shortness of breath.   Objective: Vital signs in last 24 hours: Vitals:   07/09/23 1946 07/10/23 0544  BP: (!) 148/56 (!) 140/58  Pulse: 84 87  Resp:  18  Temp: 98.2 F (36.8 C)   SpO2:  95%    Physical Exam:  General:  Alert, cooperative, no distress, appears stated age  Head:  Normocephalic, without obvious abnormality, atraumatic  Eyes:  , EOM's intact,   Lungs:   No visible respiratory distress  Heart:    Abdomen:   Abdomen is soft, nontender, nondistended, bowel sound present, no peritoneal signs  Extremities: Extremities normal, atraumatic, no  edema  Pulses: 2+ and symmetric    Lab Results: Recent Labs    07/08/23 0600 07/10/23 0627  NA 136 136  K 3.8 3.0*  CL 105 106  CO2 21* 20*  GLUCOSE 132* 163*  BUN 13 6*  CREATININE 0.85 0.69  CALCIUM 8.6* 8.3*  MG 1.9 1.3*   No results for input(s): "AST", "ALT", "ALKPHOS", "BILITOT", "PROT", "ALBUMIN" in the last 72 hours. Recent Labs    07/08/23 0600 07/10/23 0627  WBC 21.1* 14.2*  HGB 13.1 12.7  HCT 38.7 37.1  MCV 89.8 87.7  PLT 293 293   No results for input(s): "LABPROT", "INR" in the last 72 hours.    Assessment/Plan: -Colonic obstruction with possible transition point in the mid transverse colon concerning for stricture versus malignancy.  Patient with family history of colon cancer.  Last colonoscopy in 2018 did showed some transverse colonic inflammations but biopsies were negative.  -History of heart transplant in 2009 at [facility].  Recommendations -------------------------- -having few small bowel movements with use of fleets enema and MiraLAX  3 times a day.  Continue current management.   abdominal x-ray in the morning. Continue other supportive care.   Decrease narcotics.   Advance diet to full liquid  GI will follow.  Kathi Der MD, FACP 07/10/2023, 7:40 AM  Contact #  [phone]

## 2023-07-10 NOTE — Progress Notes (Signed)
Patient received a fleet enema with her scheduled night time medication. Patient woke up for morning medication stating that she is having pain in her lower abdomen. Patient is unable to describe what the pain feels like even with prompting questions such as "does it feel like cramping or pressure?". Patient had two very small bowel movements after the fleet enema. Patient has no other complaints and vitals are WNL. Provider notified per patient request.

## 2023-07-10 NOTE — Plan of Care (Signed)
  Problem: Education: Goal: Knowledge of General Education information will improve Description: Including pain rating scale, medication(s)/side effects and non-pharmacologic comfort measures Outcome: Progressing   Problem: Health Behavior/Discharge Planning: Goal: Ability to manage health-related needs will improve Outcome: Progressing   Problem: Clinical Measurements: Goal: Ability to maintain clinical measurements within normal limits will improve Outcome: Progressing   Problem: Activity: Goal: Risk for activity intolerance will decrease Outcome: Progressing   Problem: Coping: Goal: Level of anxiety will decrease Outcome: Progressing   Problem: Elimination: Goal: Will not experience complications related to urinary retention Outcome: Progressing

## 2023-07-10 NOTE — Progress Notes (Signed)
   07/10/23 1552  Mobility  Activity Ambulated independently in hallway  Level of Assistance Standby assist, set-up cues, supervision of patient - no hands on  Assistive Device Other (Comment) (IV Pole)  Distance Ambulated (ft) 375 ft  Activity Response Tolerated fair  Mobility Referral Yes  $Mobility charge 1 Mobility  Mobility Specialist Start Time (ACUTE ONLY) 1538  Mobility Specialist Stop Time (ACUTE ONLY) 1552  Mobility Specialist Time Calculation (min) (ACUTE ONLY) 14 min   Mobility Specialist: Progress Note   Pt agreeable to mobility session - received in BR. Required SB using Iv Pole with c/o abd discomfort, R hip and bottom discomfort, and feeling loopy d/t Dilaudid. Pericare completed ind, void complete. Returned to bed with all needs met - call bell within reach. Nursing student present at BOS.    Barnie Mort, BS Mobility Specialist Please contact via SecureChat or Rehab office at [phone].

## 2023-07-10 NOTE — Progress Notes (Addendum)
   07/10/23 1327  Mobility  Activity Ambulated independently in hallway  Level of Assistance Standby assist, set-up cues, supervision of patient - no hands on  Assistive Device Other (Comment) (IV Pole)  Distance Ambulated (ft) 20 ft  Activity Response Tolerated fair  Mobility Referral Yes  $Mobility charge 1 Mobility  Mobility Specialist Start Time (ACUTE ONLY) 1254  Mobility Specialist Stop Time (ACUTE ONLY) 1327  Mobility Specialist Time Calculation (min) (ACUTE ONLY) 33 min   Mobility Specialist: Progress Note  Pre-Mobility: HR 78, BP 158/65 (88) Post-Mobility: HR 84, BP 138/78 (96)  Pt agreeable to mobility session - received in bed. Required SB using Iv Pole with c/o dizziness and feeling loopy d/t Dilaudid. Returned to chair with all needs met - call bell within reach.   Barnie Mort, BS Mobility Specialist Please contact via SecureChat or Rehab office at [phone].

## 2023-07-11 ENCOUNTER — Encounter (HOSPITAL_COMMUNITY): Payer: Self-pay | Admitting: Certified Registered"

## 2023-07-11 ENCOUNTER — Inpatient Hospital Stay (HOSPITAL_COMMUNITY): Payer: Medicare Other

## 2023-07-11 DIAGNOSIS — K56609 Unspecified intestinal obstruction, unspecified as to partial versus complete obstruction: Secondary | ICD-10-CM | POA: Diagnosis not present

## 2023-07-11 LAB — MAGNESIUM: Magnesium: 1.7 mg/dL (ref 1.7–2.4)

## 2023-07-11 LAB — CBC
HCT: 38 % (ref 36.0–46.0)
Hemoglobin: 12.6 g/dL (ref 12.0–15.0)
MCH: 29.6 pg (ref 26.0–34.0)
MCHC: 33.2 g/dL (ref 30.0–36.0)
MCV: 89.4 fL (ref 80.0–100.0)
Platelets: 330 10*3/uL (ref 150–400)
RBC: 4.25 MIL/uL (ref 3.87–5.11)
RDW: 13 % (ref 11.5–15.5)
WBC: 15.1 10*3/uL — ABNORMAL HIGH (ref 4.0–10.5)
nRBC: 0 % (ref 0.0–0.2)

## 2023-07-11 LAB — BASIC METABOLIC PANEL
Anion gap: 8 (ref 5–15)
BUN: 5 mg/dL — ABNORMAL LOW (ref 8–23)
CO2: 21 mmol/L — ABNORMAL LOW (ref 22–32)
Calcium: 8.7 mg/dL — ABNORMAL LOW (ref 8.9–10.3)
Chloride: 106 mmol/L (ref 98–111)
Creatinine, Ser: 0.75 mg/dL (ref 0.44–1.00)
GFR, Estimated: >60 mL/min (ref >60)
Glucose, Bld: 143 mg/dL — ABNORMAL HIGH (ref 70–99)
Potassium: 3.9 mmol/L (ref 3.5–5.1)
Sodium: 135 mmol/L (ref 135–145)

## 2023-07-11 MED ORDER — POLYETHYLENE GLYCOL 3350 17 G PO PACK
17.0000 g | PACK | Freq: Two times a day (BID) | ORAL | Status: DC
  Administered 2023-07-11 – 2023-07-14 (×5): 17 g via ORAL
  Filled 2023-07-11 (×6): qty 1

## 2023-07-11 MED ORDER — KCL IN DEXTROSE-NACL 20-5-0.45 MEQ/L-%-% IV SOLN: INTRAVENOUS | Status: AC

## 2023-07-11 MED ORDER — CHLORHEXIDINE GLUCONATE CLOTH 2 % EX PADS
6.0000 | MEDICATED_PAD | Freq: Once | CUTANEOUS | Status: DC
  Administered 2023-07-12: 6 via TOPICAL

## 2023-07-11 MED ORDER — CHLORHEXIDINE GLUCONATE CLOTH 2 % EX PADS
6.0000 | MEDICATED_PAD | Freq: Once | CUTANEOUS | Status: AC
  Administered 2023-07-11: 6 via TOPICAL

## 2023-07-11 NOTE — H&P (View-Only) (Signed)
[facility] Gastroenterology Progress Note  [name] [name] 82 y.o. [birth year]  CC: Colonic obstruction   Subjective: Patient seen and examined at bedside.  She had multiple large bowel movements this morning.  Feeling better.  Abdominal pain improved.  Denies nausea or vomiting.  ROS : Negative for chest pain and shortness of breath.   Objective: Vital signs in last 24 hours: Vitals:   07/11/23 0804 07/11/23 0816  BP:  (!) 170/69  Pulse: 90   Resp: 18   Temp:    SpO2: 98%     Physical Exam:  General:  Alert, cooperative, no distress, appears stated age  Head:  Normocephalic, without obvious abnormality, atraumatic  Eyes:  , EOM's intact,   Lungs:   No visible respiratory distress  Heart:    Abdomen:   Abdomen is soft, nontender, nondistended, bowel sound present, no peritoneal signs  Extremities: Extremities normal, atraumatic, no  edema  Pulses: 2+ and symmetric    Lab Results: Recent Labs    07/10/23 0627 07/11/23 0528  NA 136 135  K 3.0* 3.9  CL 106 106  CO2 20* 21*  GLUCOSE 163* 143*  BUN 6* 5*  CREATININE 0.69 0.75  CALCIUM 8.3* 8.7*  MG 1.3* 1.7   No results for input(s): "AST", "ALT", "ALKPHOS", "BILITOT", "PROT", "ALBUMIN" in the last 72 hours. Recent Labs    07/10/23 0627 07/11/23 0528  WBC 14.2* 15.1*  HGB 12.7 12.6  HCT 37.1 38.0  MCV 87.7 89.4  PLT 293 330   No results for input(s): "LABPROT", "INR" in the last 72 hours.    Assessment/Plan: -Colonic obstruction with possible transition point in the mid transverse colon concerning for stricture versus malignancy.  Patient with family history of colon cancer.  Last colonoscopy in 2018 did showed some transverse colonic inflammations but biopsies were negative.  -History of heart transplant in 2009 at [facility].  Recommendations -------------------------- -Patient had multiple large bowel movements this morning with use of MiraLAX.  Abdominal pain improved.  Recommend to continue  MiraLAX twice a day for now.  Continue fleets enema.  -I will plan for repeat colonoscopy tomorrow and hopefully we can evaluate the stricture area.  Tentative plan for surgery on Friday depending on colonoscopy findings.  -Continue clear liquids diet for now.  Keep n.p.o. past midnight.  Risks (bleeding, infection, bowel perforation that could require surgery, sedation-related changes in cardiopulmonary systems), benefits (identification and possible treatment of source of symptoms, exclusion of certain causes of symptoms), and alternatives (watchful waiting, radiographic imaging studies, empiric medical treatment)  were explained to patient/family in detail and patient wishes to proceed.   Kathi Der MD, FACP 07/11/2023, 10:06 AM  Contact #  [phone]

## 2023-07-11 NOTE — Progress Notes (Signed)
[facility] Gastroenterology Progress Note  [name] [name] 82 y.o. [birth year]  CC: Colonic obstruction   Subjective: Patient seen and examined at bedside.  She had multiple large bowel movements this morning.  Feeling better.  Abdominal pain improved.  Denies nausea or vomiting.  ROS : Negative for chest pain and shortness of breath.   Objective: Vital signs in last 24 hours: Vitals:   07/11/23 0804 07/11/23 0816  BP:  (!) 170/69  Pulse: 90   Resp: 18   Temp:    SpO2: 98%     Physical Exam:  General:  Alert, cooperative, no distress, appears stated age  Head:  Normocephalic, without obvious abnormality, atraumatic  Eyes:  , EOM's intact,   Lungs:   No visible respiratory distress  Heart:    Abdomen:   Abdomen is soft, nontender, nondistended, bowel sound present, no peritoneal signs  Extremities: Extremities normal, atraumatic, no  edema  Pulses: 2+ and symmetric    Lab Results: Recent Labs    07/10/23 0627 07/11/23 0528  NA 136 135  K 3.0* 3.9  CL 106 106  CO2 20* 21*  GLUCOSE 163* 143*  BUN 6* 5*  CREATININE 0.69 0.75  CALCIUM 8.3* 8.7*  MG 1.3* 1.7   No results for input(s): "AST", "ALT", "ALKPHOS", "BILITOT", "PROT", "ALBUMIN" in the last 72 hours. Recent Labs    07/10/23 0627 07/11/23 0528  WBC 14.2* 15.1*  HGB 12.7 12.6  HCT 37.1 38.0  MCV 87.7 89.4  PLT 293 330   No results for input(s): "LABPROT", "INR" in the last 72 hours.    Assessment/Plan: -Colonic obstruction with possible transition point in the mid transverse colon concerning for stricture versus malignancy.  Patient with family history of colon cancer.  Last colonoscopy in 2018 did showed some transverse colonic inflammations but biopsies were negative.  -History of heart transplant in 2009 at [facility].  Recommendations -------------------------- -Patient had multiple large bowel movements this morning with use of MiraLAX.  Abdominal pain improved.  Recommend to continue  MiraLAX twice a day for now.  Continue fleets enema.  -I will plan for repeat colonoscopy tomorrow and hopefully we can evaluate the stricture area.  Tentative plan for surgery on Friday depending on colonoscopy findings.  -Continue clear liquids diet for now.  Keep n.p.o. past midnight.  Risks (bleeding, infection, bowel perforation that could require surgery, sedation-related changes in cardiopulmonary systems), benefits (identification and possible treatment of source of symptoms, exclusion of certain causes of symptoms), and alternatives (watchful waiting, radiographic imaging studies, empiric medical treatment)  were explained to patient/family in detail and patient wishes to proceed.   Kathi Der MD, FACP 07/11/2023, 10:06 AM  Contact #  [phone]

## 2023-07-11 NOTE — Progress Notes (Signed)
   07/11/23 0959  Mobility  Activity Transferred to/from [facility]  Level of Assistance Standby assist, set-up cues, supervision of patient - no hands on  Assistive Device Other (Comment) (chair back (no RW present in room))  Distance Ambulated (ft) 20 ft  Activity Response Tolerated fair  Mobility Referral Yes  $Mobility charge 1 Mobility  Mobility Specialist Start Time (ACUTE ONLY) [id number]  Mobility Specialist Stop Time (ACUTE ONLY) 0959  Mobility Specialist Time Calculation (min) (ACUTE ONLY) 20 min   Mobility Specialist: Progress Note  Pt agreeable to mobility session- responded to call bell request - received on B[facility]. Required SB using Chair back for stability when standing. Pericare completed with assistance, Liquid BM complete. C/o dizziness, RLQ pain, and BR urgency.  Returned to bed with all needs met - call bell within reach.   Barnie Mort, BS Mobility Specialist Please contact via SecureChat or Rehab office at [phone].

## 2023-07-11 NOTE — Progress Notes (Signed)
PROGRESS NOTE    [name] [name]  WGN:[id number] DOB: 09-09-1940 DOA: 07/05/2023 PCP: Cleatis Polka., MD   Brief Narrative:  82 y.o. female with past medical history significant for HTN, hypothyroidism, heart transplant followed by [facility] since 2009, parathyroidectomy, OSA not on CPAP, DVT on Eliquis, C. difficile colitis presented with worsening abdominal pain, nausea and obstipation.  Workup revealed possible large bowel obstruction.  General surgery and GI were consulted.  She underwent colonoscopy on 07/08/2023 which was unsuccessful.  Assessment & Plan:   Large bowel obstruction with possible transition point in the mid transverse colon concerning for stricture versus malignancy -GI and general surgery following.  Follow further recommendations -underwent colonoscopy on 07/08/2023 which was unsuccessful. -Admitted to bowel function despite being on MiraLAX and getting Fleet enema -Continue IV fluids.  Diet as per GI/general surgery  Leukocytosis -Still significant.  Monitor.  Hypokalemia -Improved  Hypomagnesemia -Improved  Essential hypertension-monitor blood pressure.  Continue diltiazem, losartan  Hypothyroidism-continue thyroxine  History of heart transplant -Follows with [facility] since 2009. Seen by [facility] cardiology while inpatient.  -Continue mycophenolate and tacrolimus  History of DVT -Eliquis on hold in case patient needs GI or surgical intervention  Anxiety/depression -Continue as needed clonazepam.  Continue venlafaxine (substituted for home Pristiq)   GERD -Continue Protonix  Class I obesity -Outpatient follow-up   DVT prophylaxis: SCDs Code Status: Full Family Communication: None at bedside Disposition Plan: Status is: Inpatient Remains inpatient appropriate because: Of severity of illness    Consultants: GI/general surgery  Procedures: As above  Antimicrobials:  None   Subjective: Patient seen and examined at bedside.  No fever, or vomiting reported.  Complains of intermittent worsening abdominal pain.  Objective: Vitals:   07/11/23 0418 07/11/23 0712 07/11/23 0804 07/11/23 0816  BP: (!) 165/74 (!) 170/69  (!) 170/69  Pulse: 87 90 90   Resp: 18 17 18    Temp: 98.2 F (36.8 C) 98.7 F (37.1 C)    TempSrc: Oral Oral    SpO2: 96% 98% 98%   Weight:      Height:        Intake/Output Summary (Last 24 hours) at 07/11/2023 1011 Last data filed at 07/11/2023 0400 Gross per 24 hour  Intake 1910.19 ml  Output --  Net 1910.19 ml   Filed Weights   07/05/23 1508 07/08/23 0735  Weight: 82.1 kg 82.1 kg    Examination:  General exam: Appears calm and comfortable.  On room air.  Chronically ill and deconditioned  respiratory system: Bilateral decreased breath sounds at bases with scattered crackles Cardiovascular system: S1 & S2 heard, Rate controlled Gastrointestinal system: Abdomen is distended, soft and mildly tender.  Bowel sounds are sluggish  extremities: No cyanosis, clubbing, edema  Central nervous system: Alert and oriented. No focal neurological deficits. Moving extremities Skin: No rashes, lesions or ulcers Psychiatry: Flat affect.  Not agitated.    Data Reviewed: I have personally reviewed following labs and imaging studies  CBC: Recent Labs  Lab 07/06/23 0627 07/07/23 0557 07/08/23 0600 07/10/23 0627 07/11/23 0528  WBC 15.8* 17.3* 21.1* 14.2* 15.1*  HGB 13.5 14.3 13.1 12.7 12.6  HCT 39.7 42.4 38.7 37.1 38.0  MCV 90.0 90.8 89.8 87.7 89.4  PLT 269 305 293 293 330   Basic Metabolic Panel: Recent Labs  Lab 07/06/23 0627 07/07/23 0557 07/08/23 0600 07/10/23 0627 07/11/23 0528  NA 136 136 136 136 135  K 3.8 4.2 3.8 3.0* 3.9  CL 108  104 105 106 106  CO2 21* 23 21* 20* 21*  GLUCOSE 134* 143* 132* 163* 143*  BUN 15 12 13  6* 5*  CREATININE 0.93 0.96 0.85 0.69 0.75  CALCIUM 8.4* 8.8* 8.6* 8.3* 8.7*  MG 1.8 1.7 1.9  1.3* 1.7  PHOS 3.1  --   --   --   --    GFR: Estimated Creatinine Clearance: 53.1 mL/min (by C-G formula based on SCr of 0.75 mg/dL). Liver Function Tests: Recent Labs  Lab 07/05/23 1514 07/06/23 0627  AST 17 14*  ALT 9 11  ALKPHOS 90 80  BILITOT 0.7 0.8  PROT 7.5 5.6*  ALBUMIN 3.9 2.6*   Recent Labs  Lab 07/05/23 1514  LIPASE <10*   No results for input(s): "AMMONIA" in the last 168 hours. Coagulation Profile: No results for input(s): "INR", "PROTIME" in the last 168 hours. Cardiac Enzymes: No results for input(s): "CKTOTAL", "CKMB", "CKMBINDEX", "TROPONINI" in the last 168 hours. BNP (last 3 results) No results for input(s): "PROBNP" in the last 8760 hours. HbA1C: No results for input(s): "HGBA1C" in the last 72 hours. CBG: No results for input(s): "GLUCAP" in the last 168 hours. Lipid Profile: No results for input(s): "CHOL", "HDL", "LDLCALC", "TRIG", "CHOLHDL", "LDLDIRECT" in the last 72 hours. Thyroid Function Tests: No results for input(s): "TSH", "T4TOTAL", "FREET4", "T3FREE", "THYROIDAB" in the last 72 hours. Anemia Panel: No results for input(s): "VITAMINB12", "FOLATE", "FERRITIN", "TIBC", "IRON", "RETICCTPCT" in the last 72 hours. Sepsis Labs: No results for input(s): "PROCALCITON", "LATICACIDVEN" in the last 168 hours.  No results found for this or any previous visit (from the past 240 hour(s)).       Radiology Studies: DG Abd 1 View  Result Date: 07/10/2023 CLINICAL DATA:  Abdominal distention EXAM: ABDOMEN - 1 VIEW COMPARISON:  Abdominal x-ray 07/07/2023 FINDINGS: The colon is dilated in the right upper quadrant measuring 11 cm similar to prior. No other dilated bowel loops are identified. No suspicious calcifications. Sternotomy wires are present. No acute fractures. IMPRESSION: Stable dilated colon in the right upper quadrant. Electronically Signed   By: Darliss Cheney M.D.   On: 07/10/2023 21:21        Scheduled Meds:  diltiazem  120 mg Oral  BID   feeding supplement  1 Container Oral TID WC   fluticasone furoate-vilanterol  1 puff Inhalation Daily   levothyroxine  50 mcg Oral Q0600   losartan  50 mg Oral Daily   magnesium oxide  400 mg Oral BID   mycophenolate  250 mg Oral BID   pantoprazole (PROTONIX) IV  40 mg Intravenous Q12H   polyethylene glycol  17 g Oral Q8H   sodium phosphate  1 enema Rectal BID   tacrolimus  0.5 mg Oral BID   venlafaxine XR  75 mg Oral Q breakfast   Continuous Infusions:  dextrose 5 % and 0.45 % NaCl with KCl 20 mEq/L 75 mL/hr at 07/10/23 2059          Glade Lloyd, MD [facility] 07/11/2023, 10:11 AM

## 2023-07-11 NOTE — Progress Notes (Signed)
3 Days Post-Op   Subjective/Chief Complaint: Patient having very little bowel function.  No nausea or vomiting.  She is having more right lower quadrant pain with the MiraLAX.   Objective: Vital signs in last 24 hours: Temp:  [97.9 F (36.6 C)-99 F (37.2 C)] 98.7 F (37.1 C) (11/13 0712) Pulse Rate:  [81-90] 90 (11/13 0804) Resp:  [17-18] 18 (11/13 0804) BP: (147-170)/(40-74) 170/69 (11/13 0816) SpO2:  [94 %-98 %] 98 % (11/13 0804) Last BM Date : 07/10/23  Intake/Output from previous day: 11/12 0701 - 11/13 0700 In: 2270.2 [P.O.:1320; I.V.:850.2; IV Piggyback:100] Out: -  Intake/Output this shift: No intake/output data recorded.  Abdomen: Mild distention with mild right lower quadrant tenderness without rebound or guarding.  Lab Results:  Recent Labs    07/10/23 0627 07/11/23 0528  WBC 14.2* 15.1*  HGB 12.7 12.6  HCT 37.1 38.0  PLT 293 330   BMET Recent Labs    07/10/23 0627 07/11/23 0528  NA 136 135  K 3.0* 3.9  CL 106 106  CO2 20* 21*  GLUCOSE 163* 143*  BUN 6* 5*  CREATININE 0.69 0.75  CALCIUM 8.3* 8.7*   PT/INR No results for input(s): "LABPROT", "INR" in the last 72 hours. ABG No results for input(s): "PHART", "HCO3" in the last 72 hours.  Invalid input(s): "PCO2", "PO2"  Studies/Results: DG Abd 1 View  Result Date: 07/10/2023 CLINICAL DATA:  Abdominal distention EXAM: ABDOMEN - 1 VIEW COMPARISON:  Abdominal x-ray 07/07/2023 FINDINGS: The colon is dilated in the right upper quadrant measuring 11 cm similar to prior. No other dilated bowel loops are identified. No suspicious calcifications. Sternotomy wires are present. No acute fractures. IMPRESSION: Stable dilated colon in the right upper quadrant. Electronically Signed   By: Darliss Cheney M.D.   On: 07/10/2023 21:21    Anti-infectives: Anti-infectives (From admission, onward)    None       Assessment/Plan: Transverse colon obstruction partial  She is on MiraLAX trying to clean her out  for bowel prep and I do not think that it is going to work really well.  If GI decides to colonoscope for the next 24 hours we will put surgery to Friday.  Otherwise, plan will be for laparoscopic partial colectomy tomorrow.  Ideally, colonoscopy would be helpful prior to surgery at this point in time it seems unlikely to be successful due to the inability to fully assess the patient.  She will require a partial colectomy which have been extended right hemicolectomy.  She would unlikely need a colostomy at this point unless intraoperative findings change that course of action.  I will stop her MiraLAX at this point since she is having more abdominal discomfort.  She can have clear liquids today and n.p.o. after midnight.  Reviewed the risk of surgery with her today given her immunocompromise state.  She is well aware of the risk.  Risk of bleeding, infection, anastomotic leak, sepsis, death, DVT, injury to intra-abdominal organs, cardiovascular event, wound complication, hernia, injury to internal viscera, and the need for other treatment standard procedures.  We also discussed the role of colostomy in her case.  If she has issues, she would end up with an ileostomy more likely.  There are no other treatment options available to her for this problem.   LOS: 6 days    Clovis Pu Marrah Vanevery 07/11/2023

## 2023-07-12 ENCOUNTER — Encounter (HOSPITAL_COMMUNITY)
Admission: EM | Disposition: A | Discharge status: Home Health | Payer: Self-pay | Source: Home / Self Care | Attending: Internal Medicine

## 2023-07-12 ENCOUNTER — Encounter (HOSPITAL_COMMUNITY): Payer: Self-pay | Admitting: Internal Medicine

## 2023-07-12 ENCOUNTER — Inpatient Hospital Stay (HOSPITAL_COMMUNITY): Payer: Medicare Other

## 2023-07-12 DIAGNOSIS — K56609 Unspecified intestinal obstruction, unspecified as to partial versus complete obstruction: Secondary | ICD-10-CM | POA: Diagnosis not present

## 2023-07-12 DIAGNOSIS — I10 Essential (primary) hypertension: Secondary | ICD-10-CM

## 2023-07-12 HISTORY — PX: BIOPSY: SHX5522

## 2023-07-12 HISTORY — PX: BOWEL DECOMPRESSION: SHX5532

## 2023-07-12 HISTORY — PX: COLONOSCOPY WITH PROPOFOL: SHX5780

## 2023-07-12 LAB — SURGICAL PCR SCREEN
MRSA, PCR: NEGATIVE
Staphylococcus aureus: NEGATIVE

## 2023-07-12 LAB — CBC WITH DIFFERENTIAL/PLATELET
Abs Immature Granulocytes: 0.08 10*3/uL — ABNORMAL HIGH (ref 0.00–0.07)
Basophils Absolute: 0.1 10*3/uL (ref 0.0–0.1)
Basophils Relative: 0 %
Eosinophils Absolute: 0.3 10*3/uL (ref 0.0–0.5)
Eosinophils Relative: 2 %
HCT: 37.1 % (ref 36.0–46.0)
Hemoglobin: 12.6 g/dL (ref 12.0–15.0)
Immature Granulocytes: 1 %
Lymphocytes Relative: 20 %
Lymphs Abs: 2.4 10*3/uL (ref 0.7–4.0)
MCH: 30.3 pg (ref 26.0–34.0)
MCHC: 34 g/dL (ref 30.0–36.0)
MCV: 89.2 fL (ref 80.0–100.0)
Monocytes Absolute: 1.4 10*3/uL — ABNORMAL HIGH (ref 0.1–1.0)
Monocytes Relative: 12 %
Neutro Abs: 7.7 10*3/uL (ref 1.7–7.7)
Neutrophils Relative %: 65 %
Platelets: 390 10*3/uL (ref 150–400)
RBC: 4.16 MIL/uL (ref 3.87–5.11)
RDW: 12.9 % (ref 11.5–15.5)
WBC: 11.9 10*3/uL — ABNORMAL HIGH (ref 4.0–10.5)
nRBC: 0 % (ref 0.0–0.2)

## 2023-07-12 LAB — TYPE AND SCREEN
ABO/RH(D): O POS
Antibody Screen: NEGATIVE

## 2023-07-12 LAB — ABO/RH: ABO/RH(D): O POS

## 2023-07-12 LAB — BASIC METABOLIC PANEL
Anion gap: 8 (ref 5–15)
BUN: 5 mg/dL — ABNORMAL LOW (ref 8–23)
CO2: 21 mmol/L — ABNORMAL LOW (ref 22–32)
Calcium: 8.5 mg/dL — ABNORMAL LOW (ref 8.9–10.3)
Chloride: 106 mmol/L (ref 98–111)
Creatinine, Ser: 0.74 mg/dL (ref 0.44–1.00)
GFR, Estimated: >60 mL/min (ref >60)
Glucose, Bld: 125 mg/dL — ABNORMAL HIGH (ref 70–99)
Potassium: 3.5 mmol/L (ref 3.5–5.1)
Sodium: 135 mmol/L (ref 135–145)

## 2023-07-12 LAB — MAGNESIUM: Magnesium: 1.3 mg/dL — ABNORMAL LOW (ref 1.7–2.4)

## 2023-07-12 SURGERY — COLONOSCOPY WITH PROPOFOL
Anesthesia: Monitor Anesthesia Care

## 2023-07-12 MED ORDER — METRONIDAZOLE 500 MG/100ML IV SOLN
500.0000 mg | Freq: Two times a day (BID) | INTRAVENOUS | Status: DC
  Administered 2023-07-12: 500 mg via INTRAVENOUS
  Filled 2023-07-12: qty 100

## 2023-07-12 MED ORDER — LIDOCAINE 2% (20 MG/ML) 5 ML SYRINGE
INTRAMUSCULAR | Status: DC | PRN
  Administered 2023-07-12: 50 mg via INTRAVENOUS

## 2023-07-12 MED ORDER — SODIUM CHLORIDE 0.9 % IV SOLN: INTRAVENOUS | Status: DC | PRN

## 2023-07-12 MED ORDER — SODIUM CHLORIDE 0.9 % IV SOLN: 2.0000 g | INTRAVENOUS | Status: DC

## 2023-07-12 MED ORDER — PROPOFOL 500 MG/50ML IV EMUL
INTRAVENOUS | Status: DC | PRN
  Administered 2023-07-12: 100 ug/kg/min via INTRAVENOUS

## 2023-07-12 MED ORDER — PROPOFOL 10 MG/ML IV BOLUS
INTRAVENOUS | Status: DC | PRN
  Administered 2023-07-12: 20 mg via INTRAVENOUS
  Administered 2023-07-12: 50 mg via INTRAVENOUS

## 2023-07-12 MED ORDER — LABETALOL HCL 5 MG/ML IV SOLN
INTRAVENOUS | Status: DC | PRN
  Administered 2023-07-12 (×2): 5 mg via INTRAVENOUS

## 2023-07-12 MED ORDER — MAGNESIUM SULFATE 4 GM/100ML IV SOLN
4.0000 g | Freq: Once | INTRAVENOUS | Status: AC
  Administered 2023-07-12: 4 g via INTRAVENOUS
  Filled 2023-07-12: qty 100

## 2023-07-12 MED ORDER — DIPHENHYDRAMINE HCL 25 MG PO CAPS: 25.0000 mg | ORAL_CAPSULE | Freq: Four times a day (QID) | ORAL | Status: DC | PRN

## 2023-07-12 SURGICAL SUPPLY — 22 items

## 2023-07-12 NOTE — Op Note (Signed)
[facility] Patient Name: [name] [name] Procedure Date : 07/12/2023 MRN: [id number] Attending MD: Kathi Der , MD, [id number] Date of Birth: 08-13-1941 CSN: [id number] Age: 82 Admit Type: Inpatient Procedure:                Colonoscopy Indications:              Abnormal CT of the GI tract Providers:                Kathi Der, MD, Vicki Mallet, RN, Priscella Mann, Technician Referring MD:              Medicines:                See the Anesthesia note for documentation of the                            administered medications Complications:            No immediate complications. Estimated Blood Loss:     Estimated blood loss was minimal. Procedure:                Pre-Anesthesia Assessment:                           - Prior to the procedure, a History and Physical                            was performed, and patient medications and                            allergies were reviewed. The patient's tolerance of                            previous anesthesia was also reviewed. The risks                            and benefits of the procedure and the sedation                            options and risks were discussed with the patient.                            All questions were answered, and informed consent                            was obtained. Prior Anticoagulants: The patient has                            taken Eliquis (apixaban). ASA Grade Assessment: III                            - A patient with severe systemic disease. After  reviewing the risks and benefits, the patient was                            deemed in satisfactory condition to undergo the                            procedure.                           After obtaining informed consent, the colonoscope                            was passed under direct vision. Throughout the                            procedure, the patient's blood  pressure, pulse, and                            oxygen saturations were monitored continuously. The                            PCF-190TL ([id number]) Olympus colonoscope was                            introduced through the anus and advanced to the the                            cecum, identified by appendiceal orifice and                            ileocecal valve. The colonoscopy was extremely                            difficult due to unusual anatomy, restricted                            mobility of the colon and significant looping.                            Successful completion of the procedure was aided by                            applying abdominal pressure. The patient tolerated                            the procedure well. The quality of the bowel                            preparation was fair. The ileocecal valve,                            appendiceal orifice, and rectum were photographed. Scope In: 9:41:12 AM Scope Out: 10:07:24 AM Scope Withdrawal Time: 0 hours 12 minutes 51 seconds  Total Procedure Duration: 0 hours 26 minutes 12  seconds  Findings:      Hemorrhoids were found on perianal exam.      The colon (entire examined portion) was significantly tortuous.       Advancing the scope required applying abdominal pressure.      Segmental severe inflammation characterized by congestion (edema),       erythema, friability, mucus and deep ulcerations was found in the       proximal transverse colon, at the hepatic flexure, in the distal       ascending colon and at the ileocecal valve. Biopsies were taken with a       cold forceps for histology.      The lumen of the ascending colon was grossly dilated. Decompression was       attempted and was successful, with complete decompression achieved.      Multiple large-mouthed diverticula were found in the sigmoid colon.       Peri-diverticular erythema was seen.      Internal hemorrhoids were found during retroflexion. The  hemorrhoids       were medium-sized. Impression:               - Preparation of the colon was fair.                           - Hemorrhoids found on perianal exam.                           - Tortuous colon.                           - Segmental severe inflammation was found in the                            proximal transverse colon, at the hepatic flexure,                            in the distal ascending colon and at the ileocecal                            valve secondary to ischemic colitis. Biopsied.                           - Dilated in the ascending colon.                           - Diverticulosis in the sigmoid colon.                            Peri-diverticular erythema was seen.                           - Internal hemorrhoids. Recommendation:           - Return patient to hospital ward for ongoing care.                           - Full liquid diet.                           -  Continue present medications.                           - Await pathology results. Procedure Code(s):        --- Professional ---                           [phone], Colonoscopy, flexible; with decompression                            (for pathologic distention) (eg, volvulus,                            megacolon), including placement of decompression                            tube, when performed                           45380, Colonoscopy, flexible; with biopsy, single                            or multiple Diagnosis Code(s):        --- Professional ---                           K64.8, Other hemorrhoids                           K55.9, Vascular disorder of intestine, unspecified                           K59.39, Other megacolon                           K57.30, Diverticulosis of large intestine without                            perforation or abscess without bleeding                           R93.3, Abnormal findings on diagnostic imaging of                            other parts of digestive  tract                           Q43.8, Other specified congenital malformations of                            intestine CPT copyright 2022 American [employer] Association. All rights reserved. The codes documented in this report are preliminary and upon coder review may  be revised to meet current compliance requirements. Kathi Der, MD Kathi Der, MD 07/12/2023 10:25:00 AM Number of Addenda: 0

## 2023-07-12 NOTE — Brief Op Note (Signed)
07/12/2023  10:12 AM  PATIENT:  [name] [name]  82 y.o. 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  It showed severe ischemic colitis involving the ascending colon, hepatic as well as transverse colon.  No evidence of obvious stricture.  Significant dilation of ascending colon noted but I was able to advance scope without any resistance through area of ischemic colitis.  Biopsies taken.  Recommendations -------------------------- -I do not think there is a need for colon resection at this point. -Recommend full liquid diet and slowly advance as tolerated -Continue MiraLAX regimen -DC fleets enema -Recommend to add Flagyl to her regimen of Rocephin for coverage of ischemic colitis. -Recommend continue antibiotics for 2 weeks total. -GI will follow  Kathi Der MD, FACP 07/12/2023, 10:13 AM  Contact #  [phone]

## 2023-07-12 NOTE — Plan of Care (Signed)

## 2023-07-12 NOTE — Transfer of Care (Signed)
Immediate Anesthesia Transfer of Care Note  Patient: [name] [name]  Procedure(s) Performed: COLONOSCOPY WITH PROPOFOL  Patient Location: PACU  Anesthesia Type:MAC  Level of Consciousness: drowsy  Airway & Oxygen Therapy: Patient Spontanous Breathing and Patient connected to nasal cannula oxygen  Post-op Assessment: Report given to RN and Post -op Vital signs reviewed and stable  Post vital signs: Reviewed and stable  Last Vitals:  Vitals Value Taken Time  BP 140/47 07/12/23 1019  Temp 36.5 C 07/12/23 1019  Pulse 68 07/12/23 1020  Resp 27 07/12/23 1020  SpO2 91 % 07/12/23 1020  Vitals shown include unfiled device data.  Last Pain:  Vitals:   07/12/23 1019  TempSrc: Temporal  PainSc: 0-No pain      Patients Stated Pain Goal: 0 (07/10/23 1000)  Complications: No notable events documented.

## 2023-07-12 NOTE — Progress Notes (Signed)
Colonoscopy reviewed  No need for surgery Recommend bowel regimen at discharge and advance diet as tolerated

## 2023-07-12 NOTE — Progress Notes (Signed)
Spoke with [name], floor RN to make aware that patient will need to have patient sign informed consent for both scheduled procedures today (colonoscopy and colectomy) prior to sending patient for first procedure since sedation will be onboard after colonoscopy. RN stated she would have both consents signed by patient at this time.

## 2023-07-12 NOTE — Progress Notes (Signed)
4 Days Post-Op   Subjective/Chief Complaint: Patient with multiple bowel movements last night.  GI plans to do colonoscopy today.   Objective: Vital signs in last 24 hours: Temp:  [97.6 F (36.4 C)-98.2 F (36.8 C)] 97.6 F (36.4 C) (11/14 0345) Pulse Rate:  [85-89] 89 (11/14 0345) Resp:  [17] 17 (11/14 0345) BP: (140-170)/(60-69) 140/60 (11/14 0345) SpO2:  [93 %-100 %] 96 % (11/14 0345) Last BM Date : 07/12/23  Intake/Output from previous day: 11/13 0701 - 11/14 0700 In: 1549.4 [P.O.:1200; I.V.:349.4] Out: -  Intake/Output this shift: No intake/output data recorded.  Abdomen: Soft less distended less tender diffusely  Lab Results:  Recent Labs    07/10/23 0627 07/11/23 0528  WBC 14.2* 15.1*  HGB 12.7 12.6  HCT 37.1 38.0  PLT 293 330   BMET Recent Labs    07/10/23 0627 07/11/23 0528  NA 136 135  K 3.0* 3.9  CL 106 106  CO2 20* 21*  GLUCOSE 163* 143*  BUN 6* 5*  CREATININE 0.69 0.75  CALCIUM 8.3* 8.7*   PT/INR No results for input(s): "LABPROT", "INR" in the last 72 hours. ABG No results for input(s): "PHART", "HCO3" in the last 72 hours.  Invalid input(s): "PCO2", "PO2"  Studies/Results: DG Abd Portable 2V  Result Date: 07/11/2023 CLINICAL DATA:  Bowel obstruction, abdominal pain. EXAM: PORTABLE ABDOMEN - 2 VIEW COMPARISON:  July 10, 2023. FINDINGS: Stable dilated colon seen in right upper quadrant. No small bowel dilatation is noted. IMPRESSION: Stable dilated colon seen in right upper quadrant concerning for ileus or obstruction. Electronically Signed   By: Lupita Raider M.D.   On: 07/11/2023 11:07   DG Abd 1 View  Result Date: 07/10/2023 CLINICAL DATA:  Abdominal distention EXAM: ABDOMEN - 1 VIEW COMPARISON:  Abdominal x-ray 07/07/2023 FINDINGS: The colon is dilated in the right upper quadrant measuring 11 cm similar to prior. No other dilated bowel loops are identified. No suspicious calcifications. Sternotomy wires are present. No acute  fractures. IMPRESSION: Stable dilated colon in the right upper quadrant. Electronically Signed   By: Darliss Cheney M.D.   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  Plan will be for surgery tomorrow.  This will be dependent on colonoscopy findings.  Consider resecting stricture alone and sending for frozen section for definitive diagnosis and OR to help better plan extent of resection.  She has better prep now which will help reduce postop complications.  Discussed with patient moving surgery to Friday and she is in agreement.  Reviewed colonoscopy once available.   LOS: 7 days    [name] [name] Fus A Taiz Bickle 07/12/2023 Moderate complexity

## 2023-07-12 NOTE — Progress Notes (Signed)
PROGRESS NOTE    [name] [name]  EXB:[id number] DOB: Nov 02, 1940 DOA: 07/05/2023 PCP: Cleatis Polka., MD   Brief Narrative:  82 y.o. female with past medical history significant for HTN, hypothyroidism, heart transplant followed by [facility] since 2009, parathyroidectomy, OSA not on CPAP, DVT on Eliquis, C. difficile colitis presented with worsening abdominal pain, nausea and obstipation.  Workup revealed possible large bowel obstruction.  General surgery and GI were consulted.  She underwent colonoscopy on 07/08/2023 which was unsuccessful.  Assessment & Plan:   Large bowel obstruction with possible transition point in the mid transverse colon concerning for stricture versus malignancy -GI and general surgery following.  Follow further recommendations -underwent colonoscopy on 07/08/2023 which was unsuccessful. -Continue IV fluids.  GI planning for colonoscopy again today.    Leukocytosis -Still significant.  Labs pending today.  Monitor.  Hypokalemia -Improved  Hypomagnesemia -Improved  Essential hypertension-monitor blood pressure.  Continue diltiazem, losartan  Hypothyroidism-continue thyroxine  History of heart transplant -Follows with [facility] since 2009. Seen by [facility] cardiology while inpatient.  -Continue mycophenolate and tacrolimus  History of DVT -Eliquis on hold in case patient needs GI or surgical intervention  Anxiety/depression -Continue as needed clonazepam.  Continue venlafaxine (substituted for home Pristiq)   GERD -Continue Protonix  Class I obesity -Outpatient follow-up   DVT prophylaxis: SCDs Code Status: Full Family Communication: None at bedside Disposition Plan: Status is: Inpatient Remains inpatient appropriate because: Of severity of illness    Consultants: GI/general surgery  Procedures: As above  Antimicrobials: None   Subjective: Patient seen and examined at bedside.   Continues to have intermittent abdominal pain.  No chest pain, fever, worsening shortness of breath reported. Objective: Vitals:   07/11/23 0816 07/11/23 1620 07/11/23 2004 07/12/23 0345  BP: (!) 170/69 (!) 157/60 (!) 169/65 (!) 140/60  Pulse:  85 85 89  Resp:  17 17 17   Temp:  98.2 F (36.8 C) 98 F (36.7 C) 97.6 F (36.4 C)  TempSrc:   Oral Oral  SpO2:  93% 100% 96%  Weight:      Height:        Intake/Output Summary (Last 24 hours) at 07/12/2023 0802 Last data filed at 07/12/2023 0400 Gross per 24 hour  Intake 1549.36 ml  Output --  Net 1549.36 ml   Filed Weights   07/05/23 1508 07/08/23 0735  Weight: 82.1 kg 82.1 kg    Examination:  General: Currently on room air.  No distress.  Chronically ill and deconditioned looking. ENT/neck: No thyromegaly.  JVD is not elevated  respiratory: Decreased breath sounds at bases bilaterally with some crackles; no wheezing  CVS: S1-S2 heard, rate controlled currently Abdominal: Soft, tender, slightly distended; no organomegaly, bowel sounds sluggish  extremities: Trace lower extremity edema; no cyanosis  CNS: Awake and alert.  No focal neurologic deficit.  Moves extremities Lymph: No obvious lymphadenopathy Skin: No obvious ecchymosis/lesions  psych: Mostly flat affect.  Currently not agitated.   Musculoskeletal: No obvious joint swelling/deformity     Data Reviewed: I have personally reviewed following labs and imaging studies  CBC: Recent Labs  Lab 07/06/23 0627 07/07/23 0557 07/08/23 0600 07/10/23 0627 07/11/23 0528  WBC 15.8* 17.3* 21.1* 14.2* 15.1*  HGB 13.5 14.3 13.1 12.7 12.6  HCT 39.7 42.4 38.7 37.1 38.0  MCV 90.0 90.8 89.8 87.7 89.4  PLT 269 305 293 293 330   Basic Metabolic Panel: Recent Labs  Lab 07/06/23 0627 07/07/23 0557 07/08/23 0600 07/10/23  8119 07/11/23 0528  NA 136 136 136 136 135  K 3.8 4.2 3.8 3.0* 3.9  CL 108 104 105 106 106  CO2 21* 23 21* 20* 21*  GLUCOSE 134* 143* 132* 163* 143*   BUN 15 12 13  6* 5*  CREATININE 0.93 0.96 0.85 0.69 0.75  CALCIUM 8.4* 8.8* 8.6* 8.3* 8.7*  MG 1.8 1.7 1.9 1.3* 1.7  PHOS 3.1  --   --   --   --    GFR: Estimated Creatinine Clearance: 53.1 mL/min (by C-G formula based on SCr of 0.75 mg/dL). Liver Function Tests: Recent Labs  Lab 07/05/23 1514 07/06/23 0627  AST 17 14*  ALT 9 11  ALKPHOS 90 80  BILITOT 0.7 0.8  PROT 7.5 5.6*  ALBUMIN 3.9 2.6*   Recent Labs  Lab 07/05/23 1514  LIPASE <10*   No results for input(s): "AMMONIA" in the last 168 hours. Coagulation Profile: No results for input(s): "INR", "PROTIME" in the last 168 hours. Cardiac Enzymes: No results for input(s): "CKTOTAL", "CKMB", "CKMBINDEX", "TROPONINI" in the last 168 hours. BNP (last 3 results) No results for input(s): "PROBNP" in the last 8760 hours. HbA1C: No results for input(s): "HGBA1C" in the last 72 hours. CBG: No results for input(s): "GLUCAP" in the last 168 hours. Lipid Profile: No results for input(s): "CHOL", "HDL", "LDLCALC", "TRIG", "CHOLHDL", "LDLDIRECT" in the last 72 hours. Thyroid Function Tests: No results for input(s): "TSH", "T4TOTAL", "FREET4", "T3FREE", "THYROIDAB" in the last 72 hours. Anemia Panel: No results for input(s): "VITAMINB12", "FOLATE", "FERRITIN", "TIBC", "IRON", "RETICCTPCT" in the last 72 hours. Sepsis Labs: No results for input(s): "PROCALCITON", "LATICACIDVEN" in the last 168 hours.  Recent Results (from the past 240 hour(s))  Surgical PCR screen     Status: None   Collection Time: 07/11/23  9:27 PM   Specimen: Nasal Mucosa; Nasal Swab  Result Value Ref Range Status   MRSA, PCR NEGATIVE NEGATIVE Final   Staphylococcus aureus NEGATIVE NEGATIVE Final    Comment: (NOTE) The Xpert SA Assay (FDA approved for NASAL specimens in patients 4 years of age and older), is one component of a comprehensive surveillance program. It is not intended to diagnose infection nor to guide or monitor treatment. Performed at  [facility], [address], [place], [place] [address]          Radiology Studies: DG Abd Portable 2V  Result Date: 07/11/2023 CLINICAL DATA:  Bowel obstruction, abdominal pain. EXAM: PORTABLE ABDOMEN - 2 VIEW COMPARISON:  July 10, 2023. FINDINGS: Stable dilated colon seen in right upper quadrant. No small bowel dilatation is noted. IMPRESSION: Stable dilated colon seen in right upper quadrant concerning for ileus or obstruction. Electronically Signed   By: Lupita Raider M.D.   On: 07/11/2023 11:07   DG Abd 1 View  Result Date: 07/10/2023 CLINICAL DATA:  Abdominal distention EXAM: ABDOMEN - 1 VIEW COMPARISON:  Abdominal x-ray 07/07/2023 FINDINGS: The colon is dilated in the right upper quadrant measuring 11 cm similar to prior. No other dilated bowel loops are identified. No suspicious calcifications. Sternotomy wires are present. No acute fractures. IMPRESSION: Stable dilated colon in the right upper quadrant. Electronically Signed   By: Darliss Cheney M.D.   On: 07/10/2023 21:21        Scheduled Meds:  Chlorhexidine Gluconate Cloth  6 each Topical Once   diltiazem  120 mg Oral BID   feeding supplement  1 Container Oral TID WC   fluticasone furoate-vilanterol  1 puff  Inhalation Daily   levothyroxine  50 mcg Oral Q0600   losartan  50 mg Oral Daily   magnesium oxide  400 mg Oral BID   mycophenolate  250 mg Oral BID   pantoprazole (PROTONIX) IV  40 mg Intravenous Q12H   polyethylene glycol  17 g Oral BID   sodium phosphate  1 enema Rectal BID   tacrolimus  0.5 mg Oral BID   venlafaxine XR  75 mg Oral Q breakfast   Continuous Infusions:  cefTRIAXone (ROCEPHIN)  IV     dextrose 5 % and 0.45 % NaCl with KCl 20 mEq/L 50 mL/hr at 07/11/23 1411          Sarh Kirschenbaum, MD [facility] 07/12/2023, 8:02 AM

## 2023-07-12 NOTE — Anesthesia Preprocedure Evaluation (Signed)
Anesthesia Evaluation  Patient identified by MRN, date of birth, ID band Patient awake    Reviewed: Allergy & Precautions, H&P , NPO status , Patient's Chart, lab work & pertinent test results  Airway Mallampati: II   Neck ROM: full    Dental   Pulmonary former smoker   breath sounds clear to auscultation       Cardiovascular hypertension, + DVT  + dysrhythmias Atrial Fibrillation + pacemaker  Rhythm:regular Rate:Normal  S/p heart transplant   Neuro/Psych  PSYCHIATRIC DISORDERS Anxiety Depression       GI/Hepatic ,GERD  ,,  Endo/Other  Hypothyroidism    Renal/GU      Musculoskeletal   Abdominal   Peds  Hematology   Anesthesia Other Findings   Reproductive/Obstetrics                             Anesthesia Physical Anesthesia Plan  ASA: 3  Anesthesia Plan: MAC   Post-op Pain Management:    Induction: Intravenous  PONV Risk Score and Plan: 2 and Propofol infusion and Treatment may vary due to age or medical condition  Airway Management Planned: Nasal Cannula  Additional Equipment:   Intra-op Plan:   Post-operative Plan:   Informed Consent: I have reviewed the patients History and Physical, chart, labs and discussed the procedure including the risks, benefits and alternatives for the proposed anesthesia with the patient or authorized representative who has indicated his/her understanding and acceptance.     Dental advisory given  Plan Discussed with: CRNA, Anesthesiologist and Surgeon  Anesthesia Plan Comments:        Anesthesia Quick Evaluation

## 2023-07-12 NOTE — Interval H&P Note (Signed)
History and Physical Interval Note:  07/12/2023 9:16 AM  [name] [name]  has presented today for surgery, with the diagnosis of Colonic obstruction.  The various methods of treatment have been discussed with the patient and family. After consideration of risks, benefits and other options for treatment, the patient has consented to  Procedure(s): COLONOSCOPY WITH PROPOFOL (N/A) as a surgical intervention.  The patient's history has been reviewed, patient examined, no change in status, stable for surgery.  I have reviewed the patient's chart and labs.  Questions were answered to the patient's satisfaction.     [name]

## 2023-07-12 NOTE — Progress Notes (Signed)
MD notified that patient stated that she felt flushed all of a sudden and then her hands went numb. New orders obtained. Will continue to monitor.

## 2023-07-13 ENCOUNTER — Encounter (HOSPITAL_COMMUNITY)
Admission: EM | Disposition: A | Discharge status: Home Health | Payer: Self-pay | Source: Home / Self Care | Attending: Internal Medicine

## 2023-07-13 DIAGNOSIS — K56609 Unspecified intestinal obstruction, unspecified as to partial versus complete obstruction: Secondary | ICD-10-CM | POA: Diagnosis not present

## 2023-07-13 LAB — CBC WITH DIFFERENTIAL/PLATELET
Abs Immature Granulocytes: 0.05 10*3/uL (ref 0.00–0.07)
Basophils Absolute: 0.1 10*3/uL (ref 0.0–0.1)
Basophils Relative: 1 %
Eosinophils Absolute: 0.4 10*3/uL (ref 0.0–0.5)
Eosinophils Relative: 4 %
HCT: 36 % (ref 36.0–46.0)
Hemoglobin: 12 g/dL (ref 12.0–15.0)
Immature Granulocytes: 1 %
Lymphocytes Relative: 22 %
Lymphs Abs: 2 10*3/uL (ref 0.7–4.0)
MCH: 29.3 pg (ref 26.0–34.0)
MCHC: 33.3 g/dL (ref 30.0–36.0)
MCV: 87.8 fL (ref 80.0–100.0)
Monocytes Absolute: 1.1 10*3/uL — ABNORMAL HIGH (ref 0.1–1.0)
Monocytes Relative: 12 %
Neutro Abs: 5.8 10*3/uL (ref 1.7–7.7)
Neutrophils Relative %: 60 %
Platelets: 373 10*3/uL (ref 150–400)
RBC: 4.1 MIL/uL (ref 3.87–5.11)
RDW: 12.8 % (ref 11.5–15.5)
WBC: 9.4 10*3/uL (ref 4.0–10.5)
nRBC: 0 % (ref 0.0–0.2)

## 2023-07-13 LAB — BASIC METABOLIC PANEL
Anion gap: 9 (ref 5–15)
BUN: 8 mg/dL (ref 8–23)
CO2: 21 mmol/L — ABNORMAL LOW (ref 22–32)
Calcium: 8.4 mg/dL — ABNORMAL LOW (ref 8.9–10.3)
Chloride: 107 mmol/L (ref 98–111)
Creatinine, Ser: 0.9 mg/dL (ref 0.44–1.00)
GFR, Estimated: >60 mL/min (ref >60)
Glucose, Bld: 132 mg/dL — ABNORMAL HIGH (ref 70–99)
Potassium: 3.5 mmol/L (ref 3.5–5.1)
Sodium: 137 mmol/L (ref 135–145)

## 2023-07-13 LAB — SURGICAL PATHOLOGY

## 2023-07-13 LAB — MAGNESIUM: Magnesium: 1.9 mg/dL (ref 1.7–2.4)

## 2023-07-13 SURGERY — LAPAROSCOPIC PARTIAL COLECTOMY
Anesthesia: General

## 2023-07-13 MED ORDER — SENNOSIDES-DOCUSATE SODIUM 8.6-50 MG PO TABS
1.0000 | ORAL_TABLET | Freq: Every day | ORAL | Status: DC
  Administered 2023-07-13: 1 via ORAL
  Filled 2023-07-13 (×2): qty 1

## 2023-07-13 MED ORDER — APIXABAN 5 MG PO TABS
5.0000 mg | ORAL_TABLET | Freq: Two times a day (BID) | ORAL | Status: DC
  Administered 2023-07-13 – 2023-07-15 (×5): 5 mg via ORAL
  Filled 2023-07-13 (×5): qty 1

## 2023-07-13 MED ORDER — SODIUM CHLORIDE 0.9 % IV SOLN
2.0000 g | INTRAVENOUS | Status: DC
  Administered 2023-07-13: 2 g via INTRAVENOUS
  Filled 2023-07-13: qty 20

## 2023-07-13 MED ORDER — ASPIRIN 81 MG PO TBEC
81.0000 mg | DELAYED_RELEASE_TABLET | Freq: Every day | ORAL | Status: DC
  Administered 2023-07-13 – 2023-07-15 (×3): 81 mg via ORAL
  Filled 2023-07-13 (×3): qty 1

## 2023-07-13 MED ORDER — SODIUM CHLORIDE 0.9 % IV SOLN
3.0000 g | Freq: Four times a day (QID) | INTRAVENOUS | Status: DC
  Administered 2023-07-13 – 2023-07-15 (×8): 3 g via INTRAVENOUS
  Filled 2023-07-13 (×9): qty 8

## 2023-07-13 MED ORDER — ADULT MULTIVITAMIN W/MINERALS CH
1.0000 | ORAL_TABLET | Freq: Every day | ORAL | Status: DC
  Administered 2023-07-13 – 2023-07-15 (×3): 1 via ORAL
  Filled 2023-07-13 (×3): qty 1

## 2023-07-13 MED ORDER — ENSURE ENLIVE PO LIQD
237.0000 mL | Freq: Two times a day (BID) | ORAL | Status: DC
  Administered 2023-07-13 – 2023-07-14 (×3): 237 mL via ORAL

## 2023-07-13 NOTE — Progress Notes (Signed)
PROGRESS NOTE    [name] [name]  QMV:[id number] DOB: 1941/07/05 DOA: 07/05/2023 PCP: Cleatis Polka., MD   Brief Narrative:  82 y.o. female with past medical history significant for HTN, hypothyroidism, heart transplant followed by [facility] since 2009, parathyroidectomy, OSA not on CPAP, DVT on Eliquis, C. difficile colitis presented with worsening abdominal pain, nausea and obstipation.  Workup revealed possible large bowel obstruction.  General surgery and GI were consulted.  She underwent colonoscopy on 07/08/2023 which was unsuccessful.  She underwent colonoscopy again on 07/12/2023 by GI.  Assessment & Plan:   Large bowel obstruction with possible transition point in the mid transverse colon concerning for stricture versus malignancy -GI and general surgery following.  Follow further recommendations -underwent colonoscopy on 07/08/2023 which was unsuccessful. - underwent colonoscopy again on 07/12/2023 by GI: Findings suggestive of likely ischemic colitis.  GI recommending to continue MiraLAX.  Fleets enema discontinued.  GI recommended to add Flagyl along with Rocephin for a total of 2 weeks of antibiotics.  Patient had flushing and numbness after Flagyl and refused further doses of Flagyl.  Will add Rocephin as per GI recommendations.  Will possibly not need surgical intervention.  Leukocytosis -Resolved  Hypokalemia -Improved  Hypomagnesemia -Improved  Essential hypertension-monitor blood pressure.  Continue diltiazem, losartan  Hypothyroidism-continue levothyroxine  History of heart transplant -Follows with [facility] since 2009. Seen by [facility] cardiology while inpatient.  -Continue mycophenolate and tacrolimus  History of DVT -Eliquis on hold.  Resume once cleared by GI/general surgery  Anxiety/depression -Continue as needed clonazepam.  Continue venlafaxine (substituted for home Pristiq)   GERD -Continue  Protonix  Class I obesity -Outpatient follow-up   DVT prophylaxis: SCDs Code Status: Full Family Communication: None at bedside Disposition Plan: Status is: Inpatient Remains inpatient appropriate because: Of severity of illness    Consultants: GI/general surgery  Procedures: As above  Antimicrobials: None   Subjective: Patient seen and examined at bedside.  No chest pain, worsening shortness breath, fever or vomiting reported.   Objective: Vitals:   07/12/23 1916 07/12/23 2021 07/13/23 0530 07/13/23 0809  BP: (!) 93/53 130/62 (!) 134/54   Pulse: 78 78 81 80  Resp: 16 18 18 18   Temp: 98.1 F (36.7 C)  98.3 F (36.8 C)   TempSrc: Oral  Oral   SpO2: 97% 98% 94%   Weight:      Height:        Intake/Output Summary (Last 24 hours) at 07/13/2023 0829 Last data filed at 07/12/2023 2236 Gross per 24 hour  Intake 540.06 ml  Output --  Net 540.06 ml   Filed Weights   07/05/23 1508 07/08/23 0735 07/12/23 0902  Weight: 82.1 kg 82.1 kg 77.1 kg    Examination:  General: No acute distress.  On room air.  Chronically ill and deconditioned looking. ENT/neck: No JVD elevation or palpable thyromegaly noted respiratory: Bilateral decreased breath sounds at bases with scattered crackles CVS: Rate mostly controlled; S1 and S2 are heard  abdominal: Soft, mildly tender and distended; no organomegaly, bowel sounds heard  extremities: No clubbing; mild lower extremity edema present  CNS: Alert and awake.  No focal neurologic deficit.  Able to extremities  lymph: No obvious cervical lymphadenopathy Skin: No obvious petechia/rashes psych: Not agitated currently.  Flat affect mostly Musculoskeletal: No obvious joint tenderness/erythema    Data Reviewed: I have personally reviewed following labs and imaging studies  CBC: Recent Labs  Lab 07/08/23 0600 07/10/23 0627 07/11/23 0528  07/12/23 0851 07/13/23 0709  WBC 21.1* 14.2* 15.1* 11.9* 9.4  NEUTROABS  --   --   --  7.7  5.8  HGB 13.1 12.7 12.6 12.6 12.0  HCT 38.7 37.1 38.0 37.1 36.0  MCV 89.8 87.7 89.4 89.2 87.8  PLT 293 293 330 390 373   Basic Metabolic Panel: Recent Labs  Lab 07/08/23 0600 07/10/23 0627 07/11/23 0528 07/12/23 0851 07/13/23 0709  NA 136 136 135 135 137  K 3.8 3.0* 3.9 3.5 3.5  CL 105 106 106 106 107  CO2 21* 20* 21* 21* 21*  GLUCOSE 132* 163* 143* 125* 132*  BUN 13 6* 5* 5* 8  CREATININE 0.85 0.69 0.75 0.74 0.90  CALCIUM 8.6* 8.3* 8.7* 8.5* 8.4*  MG 1.9 1.3* 1.7 1.3* 1.9   GFR: Estimated Creatinine Clearance: 45.8 mL/min (by C-G formula based on SCr of 0.9 mg/dL). Liver Function Tests: No results for input(s): "AST", "ALT", "ALKPHOS", "BILITOT", "PROT", "ALBUMIN" in the last 168 hours.  No results for input(s): "LIPASE", "AMYLASE" in the last 168 hours.  No results for input(s): "AMMONIA" in the last 168 hours. Coagulation Profile: No results for input(s): "INR", "PROTIME" in the last 168 hours. Cardiac Enzymes: No results for input(s): "CKTOTAL", "CKMB", "CKMBINDEX", "TROPONINI" in the last 168 hours. BNP (last 3 results) No results for input(s): "PROBNP" in the last 8760 hours. HbA1C: No results for input(s): "HGBA1C" in the last 72 hours. CBG: No results for input(s): "GLUCAP" in the last 168 hours. Lipid Profile: No results for input(s): "CHOL", "HDL", "LDLCALC", "TRIG", "CHOLHDL", "LDLDIRECT" in the last 72 hours. Thyroid Function Tests: No results for input(s): "TSH", "T4TOTAL", "FREET4", "T3FREE", "THYROIDAB" in the last 72 hours. Anemia Panel: No results for input(s): "VITAMINB12", "FOLATE", "FERRITIN", "TIBC", "IRON", "RETICCTPCT" in the last 72 hours. Sepsis Labs: No results for input(s): "PROCALCITON", "LATICACIDVEN" in the last 168 hours.  Recent Results (from the past 240 hour(s))  Surgical PCR screen     Status: None   Collection Time: 07/11/23  9:27 PM   Specimen: Nasal Mucosa; Nasal Swab  Result Value Ref Range Status   MRSA, PCR NEGATIVE  NEGATIVE Final   Staphylococcus aureus NEGATIVE NEGATIVE Final    Comment: (NOTE) The Xpert SA Assay (FDA approved for NASAL specimens in patients 70 years of age and older), is one component of a comprehensive surveillance program. It is not intended to diagnose infection nor to guide or monitor treatment. Performed at [facility], [address], [place], [place] [address]          Radiology Studies: No results found.      Scheduled Meds:  diltiazem  120 mg Oral BID   feeding supplement  1 Container Oral TID WC   fluticasone furoate-vilanterol  1 puff Inhalation Daily   levothyroxine  50 mcg Oral Q0600   losartan  50 mg Oral Daily   magnesium oxide  400 mg Oral BID   mycophenolate  250 mg Oral BID   pantoprazole (PROTONIX) IV  40 mg Intravenous Q12H   polyethylene glycol  17 g Oral BID   tacrolimus  0.5 mg Oral BID   venlafaxine XR  75 mg Oral Q breakfast   Continuous Infusions:          Glade Lloyd, MD [facility] 07/13/2023, 8:29 AM

## 2023-07-13 NOTE — Anesthesia Postprocedure Evaluation (Signed)
Anesthesia Post Note  Patient: Lee Nwankwo  Procedure(s) Performed: COLONOSCOPY WITH PROPOFOL     Patient location during evaluation: Endoscopy Anesthesia Type: MAC Level of consciousness: awake and alert Pain management: pain level controlled Vital Signs Assessment: post-procedure vital signs reviewed and stable Respiratory status: spontaneous breathing, nonlabored ventilation, respiratory function stable and patient connected to nasal cannula oxygen Cardiovascular status: stable and blood pressure returned to baseline Postop Assessment: no apparent nausea or vomiting Anesthetic complications: no   No notable events documented.  Last Vitals:  Vitals:   07/12/23 2021 07/13/23 0530  BP: 130/62 (!) 134/54  Pulse: 78 81  Resp: 18 18  Temp:  36.8 C  SpO2: 98% 94%    Last Pain:  Vitals:   07/13/23 0530  TempSrc: Oral  PainSc:                  Eleanore Junio S

## 2023-07-13 NOTE — Progress Notes (Signed)
[facility] Gastroenterology Progress Note  [name] [name] 82 y.o. 1941-05-21  CC: Colonic obstruction   Subjective: Patient seen and examined at bedside.  Complaining of generalized weakness but denies any abdominal pain.  Last bowel movement yesterday.  Passing gas.  Denies nausea or vomiting.  ROS : Negative for chest pain and shortness of breath.   Objective: Vital signs in last 24 hours: Vitals:   07/13/23 0809 07/13/23 0839  BP:  (!) 130/51  Pulse: 80 89  Resp: 18 17  Temp:  98.4 F (36.9 C)  SpO2:  96%    Physical Exam:  General:  Alert, cooperative, no distress, appears stated age  Head:  Normocephalic, without obvious abnormality, atraumatic  Eyes:  , EOM's intact,   Lungs:   No visible respiratory distress  Heart:    Abdomen:   Abdomen is soft, nontender, nondistended, bowel sound present, no peritoneal signs  Extremities: Extremities normal, atraumatic, no  edema  Pulses: 2+ and symmetric    Lab Results: Recent Labs    07/12/23 0851 07/13/23 0709  NA 135 137  K 3.5 3.5  CL 106 107  CO2 21* 21*  GLUCOSE 125* 132*  BUN 5* 8  CREATININE 0.74 0.90  CALCIUM 8.5* 8.4*  MG 1.3* 1.9   No results for input(s): "AST", "ALT", "ALKPHOS", "BILITOT", "PROT", "ALBUMIN" in the last 72 hours. Recent Labs    07/12/23 0851 07/13/23 0709  WBC 11.9* 9.4  NEUTROABS 7.7 5.8  HGB 12.6 12.0  HCT 37.1 36.0  MCV 89.2 87.8  PLT 390 373   No results for input(s): "LABPROT", "INR" in the last 72 hours.    Assessment/Plan: -Colonic obstruction with possible transition point in the mid transverse colon concerning for stricture versus malignancy.  Patient with family history of colon cancer.  Last colonoscopy in 2018 did showed some transverse colonic inflammations but biopsies were negative.  -History of heart transplant in 2009 at [facility].  Recommendations -------------------------- -Colonoscopy yesterday showed finding concerning for ischemic colitis with  significant inflammation and mild narrowing of proximal transverse colon.  Able to advance scope without any difficulty through this area.  Biopsy also showed ulceration without any concerning for malignancy. -Recommend total 2 weeks of antibiotics adjusted to her immunosuppressive regimen. -Patient was advised to stay on MiraLAX once or twice a day on a regular basis to keep stools soft. -No further inpatient GI workup planned.  Slowly advance diet as tolerated.  GI will sign off.  Follow-up in GI clinic in 6 to 8 weeks after discharge.  Kathi Der MD, FACP 07/13/2023, 10:00 AM  Contact #  [phone]

## 2023-07-13 NOTE — Progress Notes (Signed)
Nutrition Follow-up  DOCUMENTATION CODES:   Not applicable  INTERVENTION:  - Continue Ensure Enlive BID, each supplement provides 350 kcal and 20 grams of protein.  - Provide Mighty shakes TID, Mighty Shake TID with meals, each supplement provides 330 kcals and 9 grams of protein  - Provide MVI with minerals daily   - If pt has continued poor PO intake highly recommend feeding tube placement since pt has not had adequate PO intake since admission and weight is trending down.   NUTRITION DIAGNOSIS:   Inadequate oral intake related to acute illness as evidenced by energy intake < or equal to 50% for > or equal to 5 days.  - Ongoing  GOAL:   Patient will meet greater than or equal to 90% of their needs  -Progressing   MONITOR:   Labs, Diet advancement, PO intake, Supplement acceptance, Weight trends, I & O's  REASON FOR ASSESSMENT:   Consult Assessment of nutrition requirement/status  ASSESSMENT:   PMH of DVT, HTN, hypothyroidism, history [facility] heart [facility], OSA,  parathyroidectomy. Present 11/7 for abdominal pain, found to have large bowel obstruction.  11/8: CT scan - proximal colonic obstruction with stool ball, Clears  11/9: NPO 11/11: Clears 11/12 Full liquids 11/13: Clears 11/14: Colonoscopy - likely ischemic colitis, Full liquids  11/15: Soft diet  Pt has been consuming very little PO since 11/4 and has little appetite. Pt's diet has been fluctuating during admission and has not advanced past full liquids until today. Pt Does not like Boost Breeze, Will provide Ensure BID and Mighty shakes TID. Pt's obstruction seems to be doing better, hopefully she will increase PO intake.   If pt has continued poor PO intake highly recommend feeding tube placement since pt has not had adequate PO intake since admission and weight is trending down.   Admit weight: 82.1 kg carried over form last admission  Current weight: 75.66 kg standing    Admit weight seems to be carried over from 02/16/23 and has been copied forwards x1. Real admit weight is unknown?   Average Meal Intake: 11/12-11/13: 80% intake x 5 recorded meals  Nutritionally Relevant Medications: Scheduled Meds:  diltiazem  120 mg Oral BID   feeding supplement  237 mL Oral BID BM   magnesium oxide  400 mg Oral BID   multivitamin with minerals  1 tablet Oral Daily   senna-docusate  1 tablet Oral QHS   tacrolimus  0.5 mg Oral BID   venlafaxine XR  75 mg Oral Q breakfast   Continuous Infusions:  ampicillin-sulbactam (UNASYN) IV 3 g (07/13/23 1323)   Labs Reviewed: Calcium 8.4  Diet Order:   Diet Order             DIET SOFT Room service appropriate? Yes; Fluid consistency: Thin  Diet effective now                   EDUCATION NEEDS:   No education needs have been identified at this time  Skin:  Skin Assessment: Reviewed RN Assessment  Last BM:  11/14: type 5  Height:   Ht Readings from Last 1 Encounters:  07/12/23 5\' 3"  (1.6 m)    Weight:   Wt Readings from Last 1 Encounters:  07/13/23 75.7 kg    Ideal Body Weight:  51.8 kg  BMI:  Body mass index is 29.55 kg/m.  Estimated Nutritional Needs:   Kcal:  1600-1800  Protein:  80-100g  Fluid:  >1.6L  Elliot Dally, RD Registered Dietitian  See Amion for more information

## 2023-07-13 NOTE — Progress Notes (Signed)
   07/13/23 1204  Mobility  Activity Ambulated independently in hallway;Ambulated independently to bathroom  Level of Assistance Standby assist, set-up cues, supervision of patient - no hands on  Assistive Device None  Distance Ambulated (ft) 475 ft  Activity Response Tolerated fair  Mobility Referral Yes  Mobility Specialist Start Time (ACUTE ONLY) 1147  Mobility Specialist Stop Time (ACUTE ONLY) 1204  Mobility Specialist Time Calculation (min) (ACUTE ONLY) 17 min   Mobility Specialist: Progress Note  Pt agreeable to mobility session - received in bed. Required SB with no AD. C/o aching in abdomen. Returned to chair with all needs met - call bell within reach.   Barnie Mort, BS Mobility Specialist Please contact via SecureChat or Rehab office at [phone].

## 2023-07-13 NOTE — Progress Notes (Signed)
Patient ID: [name] [name], female   DOB: [birth year], 82 y.o.   MRN: [id number] [facility] Surgery Progress Note  1 Day Post-Op  Subjective: CC-  Feeling better. Denies any abdominal pain at this time. Mild nausea over night, no emesis. Does not have an appetite. Did not like the boost. Last BM 2 days ago.  Objective: Vital signs in last 24 hours: Temp:  [97.3 F (36.3 C)-98.4 F (36.9 C)] 98.4 F (36.9 C) (11/15 0839) Pulse Rate:  [62-89] 89 (11/15 0839) Resp:  [16-27] 17 (11/15 0839) BP: (93-140)/(45-62) 130/51 (11/15 0839) SpO2:  [91 %-98 %] 96 % (11/15 0839) Last BM Date : 07/12/23  Intake/Output from previous day: 11/14 0701 - 11/15 0700 In: 540.1 [P.O.:240; I.V.:200; IV Piggyback:100.1] Out: -  Intake/Output this shift: No intake/output data recorded.  PE: Gen:  Alert, NAD, pleasant Abd: soft, mild distension, nontender, bowel sounds present  Lab Results:  Recent Labs    07/12/23 0851 07/13/23 0709  WBC 11.9* 9.4  HGB 12.6 12.0  HCT 37.1 36.0  PLT 390 373   BMET Recent Labs    07/12/23 0851 07/13/23 0709  NA 135 137  K 3.5 3.5  CL 106 107  CO2 21* 21*  GLUCOSE 125* 132*  BUN 5* 8  CREATININE 0.74 0.90  CALCIUM 8.5* 8.4*   PT/INR No results for input(s): "LABPROT", "INR" in the last 72 hours. CMP     Component Value Date/Time   NA 137 07/13/2023 0709   K 3.5 07/13/2023 0709   CL 107 07/13/2023 0709   CO2 21 (L) 07/13/2023 0709   GLUCOSE 132 (H) 07/13/2023 0709   BUN 8 07/13/2023 0709   CREATININE 0.90 07/13/2023 0709   CALCIUM 8.4 (L) 07/13/2023 0709   PROT 5.6 (L) 07/06/2023 0627   ALBUMIN 2.6 (L) 07/06/2023 0627   AST 14 (L) 07/06/2023 0627   ALT 11 07/06/2023 0627   ALKPHOS 80 07/06/2023 0627   BILITOT 0.8 07/06/2023 0627   GFRNONAA >60 07/13/2023 0709   GFRAA >60 01/30/2017 1319   Lipase     Component Value Date/Time   LIPASE <10 (L) 07/05/2023 1514       Studies/Results: No results  found.  Anti-infectives: Anti-infectives (From admission, onward)    Start     Dose/Rate Route Frequency Ordered Stop   07/13/23 1100  Ampicillin-Sulbactam (UNASYN) 3 g in sodium chloride 0.9 % 100 mL IVPB        3 g 200 mL/hr over 30 Minutes Intravenous Every 6 hours 07/13/23 1019     07/13/23 0930  cefTRIAXone (ROCEPHIN) 2 g in sodium chloride 0.9 % 100 mL IVPB  Status:  Discontinued        2 g 200 mL/hr over 30 Minutes Intravenous Every 24 hours 07/13/23 0844 07/13/23 1019   07/12/23 1030  metroNIDAZOLE (FLAGYL) IVPB 500 mg  Status:  Discontinued        500 mg 100 mL/hr over 60 Minutes Intravenous Every 12 hours 07/12/23 1026 07/12/23 1704   07/12/23 0800  cefTRIAXone (ROCEPHIN) 2 g in sodium chloride 0.9 % 100 mL IVPB  Status:  Discontinued        2 g 200 mL/hr over 30 Minutes Intravenous To Short Stay 07/12/23 0556 07/12/23 1054        Assessment/Plan 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 Able to advance scope without any  difficulty through this area. Biopsy also showed ulceration without any concerning for malignancy. GI recommending 2 weeks abx and bowel regimen. - No need for surgery at this time. Recommend bowel regimen at discharge and advance diet as tolerated.  ID - unasyn VTE - SCDs, eliquis FEN - FLD, Ensure Foley - none   Hx heart transplant 2009 at [facility], on on CellCept Prograf, followed by Dr. Dot Lanes Hx recurrent DVT on eliquis (last dose 11/7 in AM) CKD-IIIa CHF HTN GERD Hypothyroidism Anxiety/depression  I reviewed Consultant gastroenterology notes, last 24 h vitals and pain scores, last 48 h intake and output, and last 24 h labs and trends.    LOS: 8 days    Franne Forts, [facility] Surgery 07/13/2023, 10:22 AM Please see Amion for pager number during day hours 7:00am-4:30pm

## 2023-07-13 NOTE — Evaluation (Signed)
Physical Therapy Evaluation Patient Details Name: [name] [name] MRN: [id number] DOB: December 24, 1940 Today's Date: 07/13/2023  History of Present Illness  82 y.o. female admitted 11/7 with worsending abdominal pain, nausea.  Workup revealed large bowel obstruction.  She underwent colonoscopy on 07/08/2023 which was unsuccessful.  She underwent colonoscopy again on 07/12/2023 by GI with dx Partial colonic obstruction 2/2 ischemic colitis . PMH: HTN, hypothyroidism, heart transplant followed by [facility] since 2009, parathyroidectomy, OSA not on CPAP, DVT on Eliquis, C. difficile colitis  Clinical Impression  Pt admitted with above diagnosis. Pt was able to ambulate with RW with CGA.  Pt desires to ambulate without device as she didn't use one PTA therefore will follow acutely and work to progress her to independence. Will benefit from HHPT.  Pt currently with functional limitations due to the deficits listed below (see PT Problem List). Pt will benefit from acute skilled PT to increase their independence and safety with mobility to allow discharge.           If plan is discharge home, recommend the following: Assistance with cooking/housework;Assist for transportation;Help with stairs or ramp for entrance   Can travel by private vehicle        Equipment Recommendations None recommended by PT  Recommendations for Other Services       Functional Status Assessment Patient has had a recent decline in their functional status and demonstrates the ability to make significant improvements in function in a reasonable and predictable amount of time.     Precautions / Restrictions Precautions Precautions: Fall Restrictions Weight Bearing Restrictions: No      Mobility  Bed Mobility Overal bed mobility: Independent                  Transfers Overall transfer level: Independent Equipment used: Rolling walker (2 wheels)               General transfer  comment: No cues or assist needed to stand    Ambulation/Gait Ambulation/Gait assistance: Contact guard assist Gait Distance (Feet): 300 Feet Assistive device: Rolling walker (2 wheels) Gait Pattern/deviations: Step-through pattern, Decreased stride length   Gait velocity interpretation: 1.31 - 2.62 ft/sec, indicative of limited community ambulator   General Gait Details: Pt overall with steady gait with RW.  Did not use RW PTA and desires to get back to no device.  Stairs            Wheelchair Mobility     Tilt Bed    Modified Rankin (Stroke Patients Only)       Balance Overall balance assessment: Needs assistance Sitting-balance support: No upper extremity supported, Feet supported Sitting balance-Leahy Scale: Fair     Standing balance support: Bilateral upper extremity supported, During functional activity Standing balance-Leahy Scale: Poor                               Pertinent Vitals/Pain Pain Assessment Pain Assessment: No/denies pain    Home Living Family/patient expects to be discharged to:: Private residence Living Arrangements: Spouse/significant other Available Help at Discharge: Family;Available 24 hours/day Type of Home: Independent living facility Home Access: Level entry         Home Equipment: Rolling Walker (2 wheels) Additional Comments: Husband has Parkinsons disease and pt "waits" on him and he has a PCA    Prior Function Prior Level of Function : Independent/Modified Independent  Extremity/Trunk Assessment   Upper Extremity Assessment Upper Extremity Assessment: Defer to OT evaluation    Lower Extremity Assessment Lower Extremity Assessment: Generalized weakness    Cervical / Trunk Assessment Cervical / Trunk Assessment: Normal  Communication   Communication Communication: No apparent difficulties  Cognition Arousal: Alert Behavior During Therapy: WFL for tasks  assessed/performed Overall Cognitive Status: Within Functional Limits for tasks assessed                                          General Comments General comments (skin integrity, edema, etc.): VSS    Exercises     Assessment/Plan    PT Assessment Patient needs continued PT services  PT Problem List Decreased balance;Decreased activity tolerance;Decreased mobility;Decreased knowledge of use of DME;Decreased safety awareness;Decreased knowledge of precautions       PT Treatment Interventions DME instruction;Gait training;Functional mobility training;Therapeutic activities;Therapeutic exercise;Balance training;Patient/family education    PT Goals (Current goals can be found in the Care Plan section)  Acute Rehab PT Goals Patient Stated Goal: to go home PT Goal Formulation: With patient Time For Goal Achievement: 07/27/23 Potential to Achieve Goals: Good    Frequency Min 1X/week     Co-evaluation               AM-PAC PT "6 Clicks" Mobility  Outcome Measure Help needed turning from your back to your side while in a flat bed without using bedrails?: None Help needed moving from lying on your back to sitting on the side of a flat bed without using bedrails?: None Help needed moving to and from a bed to a chair (including a wheelchair)?: A Little Help needed standing up from a chair using your arms (e.g., wheelchair or bedside chair)?: A Little Help needed to walk in hospital room?: A Little Help needed climbing 3-5 steps with a railing? : A Lot 6 Click Score: 19    End of Session Equipment Utilized During Treatment: Gait belt Activity Tolerance: Patient tolerated treatment well Patient left: in bed;with call bell/phone within reach Nurse Communication: Mobility status PT Visit Diagnosis: Muscle weakness (generalized) (M62.81)    Time: 0272-5366 PT Time Calculation (min) (ACUTE ONLY): 19 min   Charges:   PT Evaluation $PT Eval Moderate  Complexity: 1 Mod   PT General Charges $$ ACUTE PT VISIT: 1 Visit         [name] [name] M,PT Acute Rehab Services [phone]   Bevelyn Buckles 07/13/2023, 4:01 PM

## 2023-07-13 NOTE — Plan of Care (Signed)

## 2023-07-14 DIAGNOSIS — K56609 Unspecified intestinal obstruction, unspecified as to partial versus complete obstruction: Secondary | ICD-10-CM | POA: Diagnosis not present

## 2023-07-14 LAB — BASIC METABOLIC PANEL
Anion gap: 9 (ref 5–15)
BUN: 9 mg/dL (ref 8–23)
CO2: 21 mmol/L — ABNORMAL LOW (ref 22–32)
Calcium: 8.6 mg/dL — ABNORMAL LOW (ref 8.9–10.3)
Chloride: 108 mmol/L (ref 98–111)
Creatinine, Ser: 1.13 mg/dL — ABNORMAL HIGH (ref 0.44–1.00)
GFR, Estimated: 48 mL/min — ABNORMAL LOW (ref >60)
Glucose, Bld: 115 mg/dL — ABNORMAL HIGH (ref 70–99)
Potassium: 3.2 mmol/L — ABNORMAL LOW (ref 3.5–5.1)
Sodium: 138 mmol/L (ref 135–145)

## 2023-07-14 LAB — CBC WITH DIFFERENTIAL/PLATELET
Abs Immature Granulocytes: 0.07 10*3/uL (ref 0.00–0.07)
Basophils Absolute: 0.1 10*3/uL (ref 0.0–0.1)
Basophils Relative: 1 %
Eosinophils Absolute: 0.4 10*3/uL (ref 0.0–0.5)
Eosinophils Relative: 4 %
HCT: 36.7 % (ref 36.0–46.0)
Hemoglobin: 12.1 g/dL (ref 12.0–15.0)
Immature Granulocytes: 1 %
Lymphocytes Relative: 27 %
Lymphs Abs: 2.5 10*3/uL (ref 0.7–4.0)
MCH: 29.1 pg (ref 26.0–34.0)
MCHC: 33 g/dL (ref 30.0–36.0)
MCV: 88.2 fL (ref 80.0–100.0)
Monocytes Absolute: 1 10*3/uL (ref 0.1–1.0)
Monocytes Relative: 11 %
Neutro Abs: 5.2 10*3/uL (ref 1.7–7.7)
Neutrophils Relative %: 56 %
Platelets: 420 10*3/uL — ABNORMAL HIGH (ref 150–400)
RBC: 4.16 MIL/uL (ref 3.87–5.11)
RDW: 13 % (ref 11.5–15.5)
WBC: 9.1 10*3/uL (ref 4.0–10.5)
nRBC: 0 % (ref 0.0–0.2)

## 2023-07-14 LAB — MAGNESIUM: Magnesium: 1.3 mg/dL — ABNORMAL LOW (ref 1.7–2.4)

## 2023-07-14 MED ORDER — LOSARTAN POTASSIUM 50 MG PO TABS: 50.0000 mg | ORAL_TABLET | Freq: Every day | ORAL | 0 refills | Status: AC

## 2023-07-14 MED ORDER — POTASSIUM CHLORIDE CRYS ER 20 MEQ PO TBCR
40.0000 meq | EXTENDED_RELEASE_TABLET | ORAL | Status: AC
  Administered 2023-07-14 (×2): 40 meq via ORAL
  Filled 2023-07-14 (×2): qty 2

## 2023-07-14 MED ORDER — AMOXICILLIN-POT CLAVULANATE 875-125 MG PO TABS
1.0000 | ORAL_TABLET | Freq: Two times a day (BID) | ORAL | 0 refills | Status: AC
Start: 2023-07-14 — End: 2023-07-27

## 2023-07-14 MED ORDER — SENNOSIDES-DOCUSATE SODIUM 8.6-50 MG PO TABS: 1.0000 | ORAL_TABLET | Freq: Every day | ORAL | 0 refills | Status: AC

## 2023-07-14 MED ORDER — OXYCODONE HCL 5 MG PO TABS: 5.0000 mg | ORAL_TABLET | Freq: Four times a day (QID) | ORAL | 0 refills | Status: AC | PRN

## 2023-07-14 MED ORDER — POLYETHYLENE GLYCOL 3350 17 G PO PACK: 17.0000 g | PACK | Freq: Two times a day (BID) | ORAL | 0 refills | Status: AC

## 2023-07-14 MED ORDER — MAGNESIUM OXIDE 400 MG PO TABS: 400.0000 mg | ORAL_TABLET | Freq: Two times a day (BID) | ORAL | 0 refills | Status: AC

## 2023-07-14 MED ORDER — MAGNESIUM SULFATE 2 GM/50ML IV SOLN
2.0000 g | Freq: Once | INTRAVENOUS | Status: AC
  Administered 2023-07-14: 2 g via INTRAVENOUS
  Filled 2023-07-14: qty 50

## 2023-07-14 MED ORDER — ONDANSETRON HCL 4 MG PO TABS: 4.0000 mg | ORAL_TABLET | Freq: Four times a day (QID) | ORAL | 0 refills | Status: AC | PRN

## 2023-07-14 NOTE — Plan of Care (Signed)
?  Problem: Education: ?Goal: Knowledge of General Education information will improve ?Description: Including pain rating scale, medication(s)/side effects and non-pharmacologic comfort measures ?Outcome: Progressing ?  ?Problem: Clinical Measurements: ?Goal: Ability to maintain clinical measurements within normal limits will improve ?Outcome: Progressing ?  ?Problem: Activity: ?Goal: Risk for activity intolerance will decrease ?Outcome: Progressing ?  ?Problem: Nutrition: ?Goal: Adequate nutrition will be maintained ?Outcome: Progressing ?  ?Problem: Elimination: ?Goal: Will not experience complications related to bowel motility ?Outcome: Progressing ?  ?Problem: Safety: ?Goal: Ability to remain free from injury will improve ?Outcome: Progressing ?  ?

## 2023-07-14 NOTE — Progress Notes (Signed)
2 Days Post-Op   Subjective/Chief Complaint: Pt doing well Tol PO   Objective: Vital signs in last 24 hours: Temp:  [98.3 F (36.8 C)-99 F (37.2 C)] 99 F (37.2 C) (11/16 0716) Pulse Rate:  [75-89] 86 (11/16 0716) Resp:  [16-18] 16 (11/16 0716) BP: (117-159)/(51-66) 139/60 (11/16 0716) SpO2:  [93 %-97 %] 94 % (11/16 0716) Weight:  [75.7 kg] 75.7 kg (11/15 1235) Last BM Date : 07/12/23  Intake/Output from previous day: No intake/output data recorded. Intake/Output this shift: No intake/output data recorded.  PE:  Constitutional: No acute distress, conversant, appears states age. Eyes: Anicteric sclerae, moist conjunctiva, no lid lag Lungs: Clear to auscultation bilaterally, normal respiratory effort CV: regular rate and rhythm, no murmurs, no peripheral edema, pedal pulses 2+ GI: Soft, no masses or hepatosplenomegaly, non-tender to palpation Skin: No rashes, palpation reveals normal turgor Psychiatric: appropriate judgment and insight, oriented to person, place, and time   Lab Results:  Recent Labs    07/13/23 0709 07/14/23 0433  WBC 9.4 9.1  HGB 12.0 12.1  HCT 36.0 36.7  PLT 373 420*   BMET Recent Labs    07/13/23 0709 07/14/23 0433  NA 137 138  K 3.5 3.2*  CL 107 108  CO2 21* 21*  GLUCOSE 132* 115*  BUN 8 9  CREATININE 0.90 1.13*  CALCIUM 8.4* 8.6*   PT/INR No results for input(s): "LABPROT", "INR" in the last 72 hours. ABG No results for input(s): "PHART", "HCO3" in the last 72 hours.  Invalid input(s): "PCO2", "PO2"  Studies/Results: No results found.  Anti-infectives: Anti-infectives (From admission, onward)    Start     Dose/Rate Route Frequency Ordered Stop   07/13/23 1100  Ampicillin-Sulbactam (UNASYN) 3 g in sodium chloride 0.9 % 100 mL IVPB        3 g 200 mL/hr over 30 Minutes Intravenous Every 6 hours 07/13/23 1019     07/13/23 0930  cefTRIAXone (ROCEPHIN) 2 g in sodium chloride 0.9 % 100 mL IVPB  Status:  Discontinued        2  g 200 mL/hr over 30 Minutes Intravenous Every 24 hours 07/13/23 0844 07/13/23 1019   07/12/23 1030  metroNIDAZOLE (FLAGYL) IVPB 500 mg  Status:  Discontinued        500 mg 100 mL/hr over 60 Minutes Intravenous Every 12 hours 07/12/23 1026 07/12/23 1704   07/12/23 0800  cefTRIAXone (ROCEPHIN) 2 g in sodium chloride 0.9 % 100 mL IVPB  Status:  Discontinued        2 g 200 mL/hr over 30 Minutes Intravenous To Short Stay 07/12/23 0556 07/12/23 1054       Assessment/Plan: 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 Able to advance scope without any difficulty through this area. Biopsy also showed ulceration without any concerning for malignancy. GI recommending 2 weeks abx and bowel regimen. - No need for surgery at this time. Recommend bowel regimen at discharge and advance diet as tolerated. -Please call back if needed   ID - unasyn per primary VTE - SCDs, eliquis FEN - Soft, Ensure Foley - none   Hx heart transplant 2009 at [facility], on on CellCept Prograf, followed by Dr. Dot Lanes Hx recurrent DVT on eliquis (last dose 11/7 in AM) CKD-IIIa CHF HTN GERD Hypothyroidism Anxiety/depression  LOS: 9 days    Axel Filler 07/14/2023

## 2023-07-14 NOTE — Progress Notes (Signed)
Attempted to contact Clide Cliff, pt's daughter listed in the chart to notify of d'c. Left vm for return call. Pending response. Also, attempted to contact, Leticia Penna, other relative listed in the chart. Unable to reach relative but didn't leave vm.

## 2023-07-14 NOTE — Plan of Care (Signed)
  Problem: Education: Goal: Knowledge of General Education information will improve Description: Including pain rating scale, medication(s)/side effects and non-pharmacologic comfort measures Outcome: Progressing   Problem: Clinical Measurements: Goal: Ability to maintain clinical measurements within normal limits will improve Outcome: Progressing Goal: Will remain free from infection Outcome: Progressing Goal: Diagnostic test results will improve Outcome: Progressing Goal: Respiratory complications will improve Outcome: Progressing Goal: Cardiovascular complication will be avoided Outcome: Progressing   Problem: Activity: Goal: Risk for activity intolerance will decrease Outcome: Progressing   Problem: Nutrition: Goal: Adequate nutrition will be maintained Outcome: Progressing   Problem: Coping: Goal: Level of anxiety will decrease Outcome: Progressing   Problem: Elimination: Goal: Will not experience complications related to bowel motility Outcome: Progressing Goal: Will not experience complications related to urinary retention Outcome: Progressing   Problem: Pain Management: Goal: General experience of comfort will improve Outcome: Progressing   Problem: Safety: Goal: Ability to remain free from injury will improve Outcome: Progressing   Problem: Skin Integrity: Goal: Risk for impaired skin integrity will decrease Outcome: Progressing

## 2023-07-14 NOTE — TOC Transition Note (Signed)
Transition of Care [facility]) - CM/SW Discharge Note   Patient Details  Name: [name] [name] MRN: [id number] Date of Birth: 1941-07-19  [facility]) CM/SW Contact:  Ronny Bacon, RN Phone Number: 07/14/2023, 10:43 AM   Clinical Narrative:   Patient is being discharged today. HH PT orders noted and arranged through [facility] with [place]. Contact information placed on AVS.    Final next level of care: Home w Home Health Services Barriers to Discharge: No Barriers Identified   Patient Goals and CMS Choice      Discharge Placement                         Discharge Plan and Services Additional resources added to the After Visit Summary for                            [facility] Arranged: PT HH Agency: [facility] Date [facility] Agency Contacted: 07/14/23 Time HH Agency Contacted: 1041 Representative spoke with at [facility] Agency: Kandee Keen  Social Determinants of Health (SDOH) Interventions SDOH Screenings   Food Insecurity: No Food Insecurity (07/05/2023)  Housing: Low Risk  (07/05/2023)  Transportation Needs: No Transportation Needs (07/05/2023)  Utilities: Not At Risk (07/05/2023)  Tobacco Use: Medium Risk (07/12/2023)     Readmission Risk Interventions     No data to display

## 2023-07-14 NOTE — Progress Notes (Signed)
Pt is refusing d'c due to abdominal pain.Notified Glade Lloyd, MD via secure chat. Dr. Hanley Ben confirmed that pt is ready for d'c per general surgery and GI progress notes. He suggested I reach out to the pt's daughter to notify of d'c orders.

## 2023-07-14 NOTE — Progress Notes (Signed)
Mobility Specialist: Progress Note   07/14/23 1246  Mobility  Activity Ambulated with assistance in room  Level of Assistance Standby assist, set-up cues, supervision of patient - no hands on  Assistive Device Front wheel walker  Distance Ambulated (ft) 60 ft  Activity Response Tolerated well  Mobility Referral Yes  $Mobility charge 1 Mobility  Mobility Specialist Start Time (ACUTE ONLY) 1000  Mobility Specialist Stop Time (ACUTE ONLY) 1033  Mobility Specialist Time Calculation (min) (ACUTE ONLY) 33 min    Pt was agreeable to mobility session - received in chair. Opted to use RW today since she felt weaker from pain and diarrhea. SV throughout. Ambulation deferred d/t diarrhea. Transfer to [facility] under SV; completed void, BM and peri care independently. Left in chair with all needs met, call bell in reach.   Maurene Capes Mobility Specialist Please contact via SecureChat or Rehab office at [phone]

## 2023-07-14 NOTE — Progress Notes (Signed)
Reiterated to pt what Dr. Hanley Ben discussed with her this morning. Also, that she has been cleared by both GI and general surgery for d'c. Pt is adamant about not being d'c today. She stated, " Let the doctor know I will sign something if  I need to, but I don't want to be discharge today.": Notified MD via secure chat.

## 2023-07-14 NOTE — Progress Notes (Signed)
Spoke with pt's daughter, Alvino Chapel, and informed her of pt's d'c tomorrow. Alvino Chapel confirmed that Darl Pikes listed in pt's chart lives approx 10 mins away from pt and will be able to transport her home upon d'c. All questions answered.

## 2023-07-14 NOTE — Discharge Summary (Signed)
Physician Discharge Summary  [name] [name] NWG:[id number] DOB: 1941-06-28 DOA: 07/05/2023  PCP: Cleatis Polka., MD  Admit date: 07/05/2023 Discharge date: 07/14/2023  Admitted From: Home Disposition: Home  Recommendations for Outpatient Follow-up:  Follow up with PCP in 1 week with repeat CBC/BMP Outpatient follow-up with GI Follow up in ED if symptoms worsen or new appear   Home Health: No Equipment/Devices: None  Discharge Condition: Stable CODE STATUS: Full Diet recommendation: Heart healthy  Brief/Interim Summary: 82 y.o. female with past medical history significant for HTN, hypothyroidism, heart transplant followed by [facility] since 2009, parathyroidectomy, OSA not on CPAP, DVT on Eliquis, C. difficile colitis presented with worsening abdominal pain, nausea and obstipation.  Workup revealed possible large bowel obstruction.  General surgery and GI were consulted.  She underwent colonoscopy on 07/08/2023 which was unsuccessful.  She underwent colonoscopy again on 07/12/2023 by GI.  There were no strictures found.  GI thought that findings were suggestive of ischemic colitis and recommended 2 weeks of antibiotics along with bowel regimen.  GI has signed off.  General surgery has signed off as well.  Patient feels better and feels able to go home today.  She will be discharged home today with outpatient follow-up with GI.  Discharge Diagnoses:   Large bowel obstruction with possible transition point in the mid transverse colon concerning for stricture versus malignancy -GI and general surgery following.  Follow further recommendations -underwent colonoscopy on 07/08/2023 which was unsuccessful. - underwent colonoscopy again on 07/12/2023 by GI: Findings suggestive of likely ischemic colitis.  GI recommending to continue MiraLAX.  Fleets enema discontinued.  GI recommended to continue a total of 2 weeks of antibiotics.  Patient had flushing and  numbness after Flagyl and refused further doses of Flagyl.  Antibiotics switched to Unasyn.  Patient will be discharged home today on oral Augmentin for another 13 days to finish 2 weeks course of antibiotics.  Patient advised to take Zofran prior to Augmentin as Augmentin gives her nausea.  GI and general surgery have signed off.  Outpatient follow-up with GI.    Leukocytosis -Resolved   Hypokalemia -Replace prior to discharge   Hypomagnesemia -Replace prior to discharge.  Continue replacement as an outpatient.  Essential hypertension continue home regimen of diltiazem and losartan.   Hypothyroidism-continue levothyroxine   History of heart transplant -Follows with [facility] since 2009. Seen by [facility] cardiology while inpatient.  -Continue mycophenolate and tacrolimus.  Outpatient follow-up with transplant team   History of DVT -Eliquis has been resumed and will be continued  Anxiety/depression -Continue home regimen  GERD -Continue Pepcid   Class I obesity -Outpatient follow-up  Thrombocytosis -Mild.  Monitor intermittently as an outpatient    Discharge Instructions  Discharge Instructions     Diet - low sodium heart healthy   Complete by: As directed    Increase activity slowly   Complete by: As directed       Allergies as of 07/14/2023       Reactions   Antihistamines, Chlorpheniramine-type Other (See Comments)   Numbness   Augmentin [amoxicillin-pot Clavulanate]    Nausea only   Cyclobenzaprine Other (See Comments)   Fentanyl Other (See Comments)   Other reaction(s): Confusion (intolerance)   Metronidazole    Unknown reaction    Valtrex [valacyclovir]    "Made my chin numb"   Codeine Nausea Only        Medication List     STOP taking these medications  acetaminophen 500 MG tablet Commonly known as: TYLENOL   clonazePAM 1 MG tablet Commonly known as: KLONOPIN   Hyoscyamine Sulfate SL 0.125 MG Subl Commonly known as:  Levsin/SL   loperamide 2 MG capsule Commonly known as: IMODIUM   omeprazole 20 MG capsule Commonly known as: PRILOSEC   phenazopyridine 200 MG tablet Commonly known as: PYRIDIUM       TAKE these medications    Align Chew Chew by mouth.   amoxicillin-clavulanate 875-125 MG tablet Commonly known as: AUGMENTIN Take 1 tablet by mouth 2 (two) times daily for 13 days.   aspirin 81 MG tablet Take 81 mg by mouth daily.   Breo Ellipta 100-25 MCG/INH Aepb Generic drug: fluticasone furoate-vilanterol Inhale 1 puff into the lungs daily.   calcium carbonate 500 MG chewable tablet Commonly known as: TUMS - dosed in mg elemental calcium Chew 1 tablet (200 mg of elemental calcium total) by mouth 3 (three) times daily as needed for indigestion or heartburn.   cyanocobalamin 500 MCG tablet Commonly known as: VITAMIN B12 Take 500 mcg by mouth daily.   desvenlafaxine 50 MG 24 hr tablet Commonly known as: PRISTIQ Take 50 mg by mouth daily.   diltiazem 120 MG 24 hr capsule Commonly known as: CARDIZEM CD Take 120 mg by mouth 2 (two) times daily.   Eliquis 5 MG Tabs tablet Generic drug: apixaban Take 5 mg by mouth 2 (two) times daily.   famotidine 20 MG tablet Commonly known as: PEPCID Take 20 mg by mouth 2 (two) times daily.   levothyroxine 50 MCG tablet Commonly known as: SYNTHROID Take 50 mcg by mouth daily before breakfast.   losartan 50 MG tablet Commonly known as: COZAAR Take 1 tablet (50 mg total) by mouth daily. Start taking on: July 15, 2023 What changed:  medication strength how much to take when to take this   magnesium oxide 400 MG tablet Commonly known as: MAG-OX Take 1 tablet (400 mg total) by mouth 2 (two) times daily. What changed: when to take this   MULTIVITAMIN PO Take 1 tablet by mouth daily.   mycophenolate 250 MG capsule Commonly known as: CELLCEPT Take 1 capsule by mouth 2 (two) times daily. What changed: Another medication with the  same name was removed. Continue taking this medication, and follow the directions you see here.   ondansetron 4 MG tablet Commonly known as: ZOFRAN Take 1 tablet (4 mg total) by mouth every 6 (six) hours as needed for nausea.   oxyCODONE 5 MG immediate release tablet Commonly known as: Oxy IR/ROXICODONE Take 1 tablet (5 mg total) by mouth every 6 (six) hours as needed for moderate pain (pain score 4-6).   polyethylene glycol 17 g packet Commonly known as: MIRALAX / GLYCOLAX Take 17 g by mouth 2 (two) times daily.   promethazine 12.5 MG tablet Commonly known as: PHENERGAN Take 1 tablet (12.5 mg total) by mouth every 6 (six) hours as needed for nausea or vomiting.   senna-docusate 8.6-50 MG tablet Commonly known as: Senokot-S Take 1 tablet by mouth at bedtime.   simethicone 80 MG chewable tablet Commonly known as: Gas-X Chew 1 tablet (80 mg total) by mouth every 6 (six) hours as needed for flatulence.   tacrolimus 0.5 MG capsule Commonly known as: PROGRAF Take 0.5 mg by mouth 2 (two) times daily.   Vitamin D (Ergocalciferol) 1.25 MG (50000 UNIT) Caps capsule Commonly known as: DRISDOL Take 50,000 Units by mouth every Friday.  Follow-up Information     Cleatis Polka., MD. Schedule an appointment as soon as possible for a visit in 1 week(s).   Specialty: Internal Medicine Why: with repeat cbc/bmp Contact information: [address] [place] [address] [phone]         Vida Rigger, MD. Schedule an appointment as soon as possible for a visit in 1 week(s).   Specialty: Gastroenterology Contact information: [address] [place] [address] [phone]                Allergies  Allergen Reactions   Antihistamines, Chlorpheniramine-Type Other (See Comments)    Numbness   Augmentin [Amoxicillin-Pot Clavulanate]     Nausea only   Cyclobenzaprine Other (See Comments)   Fentanyl Other (See Comments)    Other reaction(s):  Confusion (intolerance)   Metronidazole     Unknown reaction    Valtrex [Valacyclovir]     "Made my chin numb"   Codeine Nausea Only    Consultations: GI/general surgery   Procedures/Studies: CT ABDOMEN PELVIS W CONTRAST  Addendum Date: 07/12/2023   ADDENDUM REPORT: 07/12/2023 09:35 Electronically Signed   By: Signa Kell M.D.   On: 07/12/2023 09:35   Result Date: 07/12/2023 CLINICAL DATA:  Nonlocalized acute abdominal pain. EXAM: CT ABDOMEN AND PELVIS WITH CONTRAST TECHNIQUE: Multidetector CT imaging of the abdomen and pelvis was performed using the standard protocol following bolus administration of intravenous contrast. RADIATION DOSE REDUCTION: This exam was performed according to the departmental dose-optimization program which includes automated exposure control, adjustment of the mA and/or kV according to patient size and/or use of iterative reconstruction technique. CONTRAST:  80mL OMNIPAQUE IOHEXOL 300 MG/ML  SOLN COMPARISON:  03/15/2022. FINDINGS: Lower chest: No acute abnormality. Hepatobiliary: No focal liver abnormality is seen. No gallstones, gallbladder wall thickening, or biliary dilatation. Pancreas: Unremarkable. No pancreatic ductal dilatation or surrounding inflammatory changes. Spleen: Normal in size without focal abnormality. Adrenals/Urinary Tract: Normal appearance of the adrenal glands. No kidney mass, nephrolithiasis or signs of obstructive uropathy. Stomach/Bowel: Moderate hiatal hernia. Stomach otherwise unremarkable. There is no small bowel wall thickening, inflammation or distension. Atypical location of the cecum within the right upper quadrant of the abdomen. There is abnormal dilatation of the cecum and proximal colon up to the level of the mid transverse colon. The dilated colon measures up to 7.3 cm and there is mild wall thickening with surrounding soft tissue stranding, image 53/2. No pneumatosis. Abrupt transition to decreased caliber mid and distal  transverse colon is identified at this level. At the transition point there is a desiccated stool ball measuring 5.6 by 4.8 cm. Underlying colonic stricture either benign or malignant is suspected at this level. Distal colonic diverticulosis noted without signs of acute diverticulitis. Vascular/Lymphatic: Aortic atherosclerosis. No aneurysm. No signs of abdominopelvic adenopathy. Reproductive: Status post hysterectomy. No adnexal masses. Other: No free fluid or fluid collections. No signs of pneumoperitoneum. Musculoskeletal: No acute or significant osseous findings. IMPRESSION: 1. Signs of proximal colonic obstruction with abnormal dilatation of the cecum and proximal colon up to the level of the mid transverse colon. The dilated proximal colon measures up to 7.3 cm and there is mild wall thickening with surrounding soft tissue stranding. Abrupt transition to decreased caliber mid and distal colon is identified at this level. At the transition point there is a desiccated stool ball measuring 5.6 by 4.8 cm. Underlying colonic stricture either benign or malignant is suspected at this level. Surgical consultation is advised. 2. Moderate  hiatal hernia. 3. Distal colonic diverticulosis without signs of acute diverticulitis. 4.  Aortic Atherosclerosis (ICD10-I70.0). Electronically Signed: By: Signa Kell M.D. On: 07/05/2023 19:14   DG Abd Portable 2V  Result Date: 07/11/2023 CLINICAL DATA:  Bowel obstruction, abdominal pain. EXAM: PORTABLE ABDOMEN - 2 VIEW COMPARISON:  July 10, 2023. FINDINGS: Stable dilated colon seen in right upper quadrant. No small bowel dilatation is noted. IMPRESSION: Stable dilated colon seen in right upper quadrant concerning for ileus or obstruction. Electronically Signed   By: Lupita Raider M.D.   On: 07/11/2023 11:07   DG Abd 1 View  Result Date: 07/10/2023 CLINICAL DATA:  Abdominal distention EXAM: ABDOMEN - 1 VIEW COMPARISON:  Abdominal x-ray 07/07/2023 FINDINGS: The colon  is dilated in the right upper quadrant measuring 11 cm similar to prior. No other dilated bowel loops are identified. No suspicious calcifications. Sternotomy wires are present. No acute fractures. IMPRESSION: Stable dilated colon in the right upper quadrant. Electronically Signed   By: Darliss Cheney M.D.   On: 07/10/2023 21:21   ECHOCARDIOGRAM COMPLETE  Result Date: 07/07/2023    ECHOCARDIOGRAM REPORT   Patient Name:   JANYLL HIMMELMAN Maddix Date of Exam: 07/07/2023 Medical Rec #:  [id number]           Height:       63.0 in Accession #:    [id number]          Weight:       181.0 lb Date of Birth:  1941/03/08          BSA:          1.853 m Patient Age:    82 years            BP:           143/55 mmHg Patient Gender: F                   HR:           88 bpm. Exam Location:  Inpatient Procedure: 2D Echo, Color Doppler, Cardiac Doppler and Strain Analysis Indications:    Preoperative Evaluation  History:        Patient has no prior history of Echocardiogram examinations. CHF                 and Heart Transplant, Arrythmias:Atrial Fibrillation; Risk                 Factors:Former Smoker and Hypertension.  Sonographer:    Raeford Razor Referring Phys: [id number] SHENG L HALEY  Sonographer Comments: Image acquisition challenging due to patient body habitus. IMPRESSIONS  1. Left ventricular ejection fraction, by estimation, is 60 to 65%. The left ventricle has normal function. The left ventricle has no regional wall motion abnormalities. Left ventricular diastolic parameters were normal. The average left ventricular global longitudinal strain is -21.2 %. The global longitudinal strain is normal.  2. Right ventricular systolic function is hyperdynamic. The right ventricular size is normal. Tricuspid regurgitation signal is inadequate for assessing PA pressure.  3. Left atrial size was severely dilated.  4. Right atrial size was severely dilated.  5. The mitral valve is normal in structure. Mild mitral valve regurgitation.  6. The  aortic valve was not well visualized. Aortic valve regurgitation is moderate to severe. No aortic stenosis is present. Aortic regurgitation PHT measures 276 msec. FINDINGS  Left Ventricle: Left ventricular ejection fraction, by estimation, is 60 to 65%. The left ventricle has normal function. The  left ventricle has no regional wall motion abnormalities. The average left ventricular global longitudinal strain is -21.2 %. The global longitudinal strain is normal. The left ventricular internal cavity size was normal in size. There is no left ventricular hypertrophy. Left ventricular diastolic parameters were normal. Right Ventricle: The right ventricular size is normal. No increase in right ventricular wall thickness. Right ventricular systolic function is hyperdynamic. Tricuspid regurgitation signal is inadequate for assessing PA pressure. Left [facility]: [place] appear large due to transplant. Left atrial size was severely dilated. Right [facility]: Right atrial size was severely dilated. Pericardium: There is no evidence of pericardial effusion. Mitral Valve: The mitral valve is normal in structure. There is mild thickening of the anterior mitral valve leaflet(s). Mild mitral valve regurgitation, with eccentric laterally directed jet. Tricuspid Valve: The tricuspid valve is normal in structure. Tricuspid valve regurgitation is not demonstrated. Aortic Valve: The aortic valve was not well visualized. Aortic valve regurgitation is moderate to severe. Aortic regurgitation PHT measures 276 msec. No aortic stenosis is present. Aortic valve peak gradient measures 17.6 mmHg. Pulmonic Valve: The pulmonic valve was grossly normal. Pulmonic valve regurgitation is not visualized. No evidence of pulmonic stenosis. Aorta: The aortic root and ascending aorta are structurally normal, with no evidence of dilitation. IAS/Shunts: The interatrial septum was not well visualized.  LEFT VENTRICLE PLAX 2D LVIDd:         4.40 cm LVIDs:          3.00 cm   2D Longitudinal Strain LV PW:         0.90 cm   2D Strain GLS Avg:     -21.2 % LV IVS:        1.00 cm LVOT diam:     1.70 cm LVOT Area:     2.27 cm  RIGHT VENTRICLE RV S prime:     14.10 cm/s TAPSE (M-mode): 1.6 cm LEFT ATRIUM             Index        RIGHT ATRIUM           Index LA diam:        6.00 cm 3.24 cm/m   RA Area:     14.60 cm LA Vol (A2C):   66.2 ml 35.72 ml/m  RA Volume:   39.50 ml  21.31 ml/m LA Vol (A4C):   41.2 ml 22.23 ml/m LA Biplane Vol: 54.8 ml 29.57 ml/m  AORTIC VALVE AV Area (Vmax): 1.52 cm AV Vmax:        210.00 cm/s AV Peak Grad:   17.6 mmHg LVOT Vmax:      141.00 cm/s AI PHT:         276 msec  AORTA Ao Root diam: 2.60 cm Ao Asc diam:  2.90 cm MITRAL VALVE MV Area (PHT): 5.54 cm     SHUNTS MV Decel Time: 137 msec     Systemic Diam: 1.70 cm MV E velocity: 119.00 cm/s MV A velocity: 88.20 cm/s MV E/A ratio:  1.35 Mihai Croitoru MD Electronically signed by Thurmon Fair MD Signature Date/Time: 07/07/2023/5:38:00 PM    Final    DG Abd Portable 1V  Result Date: 07/07/2023 CLINICAL DATA:  Colonic distention. EXAM: PORTABLE ABDOMEN - 1 VIEW COMPARISON:  CT abdomen and pelvis dated 07/05/2023. FINDINGS: A dilated loop of large bowel is seen in the right hemiabdomen. This appears similar to prior CT abdomen pelvis dated 07/05/2023. There is a small right pleural effusion with associated atelectasis/airspace disease.  No radio-opaque calculi or other significant radiographic abnormality are seen. IMPRESSION: 1. Dilated loop of large bowel in the right hemiabdomen. 2. Small right pleural effusion with associated atelectasis/airspace disease. Electronically Signed   By: Romona Curls M.D.   On: 07/07/2023 14:21   DG CHEST PORT 1 VIEW  Result Date: 07/06/2023 CLINICAL DATA:  Pre-op cardiovascular examination. EXAM: PORTABLE CHEST 1 VIEW COMPARISON:  02/16/2023 FINDINGS: Chronic elevation of the right hemidiaphragm. No evidence for focal airspace disease and no overt pulmonary  edema. Heart size is within normal limits and grossly stable. Median sternotomy wires are present. Atherosclerotic calcifications at the aortic arch. IMPRESSION: 1. No acute cardiopulmonary disease. 2. Chronic elevation of the right hemidiaphragm. Electronically Signed   By: Richarda Overlie M.D.   On: 07/06/2023 08:42      Subjective: Patient seen and examined at bedside.  Tolerating diet and denies worsening abdominal pain.  Feels okay to go home today.  No fever or vomiting reported.  Discharge Exam: Vitals:   07/14/23 0357 07/14/23 0716  BP: (!) 159/55 139/60  Pulse: 77 86  Resp: 18 16  Temp: 98.3 F (36.8 C) 99 F (37.2 C)  SpO2: 93% 94%    General: Pt is alert, awake, not in acute distress.  On room air.  Looks chronically ill and deconditioned. Cardiovascular: rate controlled, S1/S2 + Respiratory: bilateral decreased breath sounds at bases Abdominal: Soft, mildly tender and distended, bowel sounds + Extremities: Trace lower extremity edema; no cyanosis    The results of significant diagnostics from this hospitalization (including imaging, microbiology, ancillary and laboratory) are listed below for reference.     Microbiology: Recent Results (from the past 240 hour(s))  Surgical PCR screen     Status: None   Collection Time: 07/11/23  9:27 PM   Specimen: Nasal Mucosa; Nasal Swab  Result Value Ref Range Status   MRSA, PCR NEGATIVE NEGATIVE Final   Staphylococcus aureus NEGATIVE NEGATIVE Final    Comment: (NOTE) The Xpert SA Assay (FDA approved for NASAL specimens in patients 54 years of age and older), is one component of a comprehensive surveillance program. It is not intended to diagnose infection nor to guide or monitor treatment. Performed at [facility], [address] [place], [place] [address]      Labs: BNP (last 3 results) No results for input(s): "BNP" in the last 8760 hours. Basic Metabolic Panel: Recent Labs  Lab 07/10/23 0627 07/11/23 0528  07/12/23 0851 07/13/23 0709 07/14/23 0433  NA 136 135 135 137 138  K 3.0* 3.9 3.5 3.5 3.2*  CL 106 106 106 107 108  CO2 20* 21* 21* 21* 21*  GLUCOSE 163* 143* 125* 132* 115*  BUN 6* 5* 5* 8 9  CREATININE 0.69 0.75 0.74 0.90 1.13*  CALCIUM 8.3* 8.7* 8.5* 8.4* 8.6*  MG 1.3* 1.7 1.3* 1.9 1.3*   Liver Function Tests: No results for input(s): "AST", "ALT", "ALKPHOS", "BILITOT", "PROT", "ALBUMIN" in the last 168 hours. No results for input(s): "LIPASE", "AMYLASE" in the last 168 hours. No results for input(s): "AMMONIA" in the last 168 hours. CBC: Recent Labs  Lab 07/10/23 0627 07/11/23 0528 07/12/23 0851 07/13/23 0709 07/14/23 0433  WBC 14.2* 15.1* 11.9* 9.4 9.1  NEUTROABS  --   --  7.7 5.8 5.2  HGB 12.7 12.6 12.6 12.0 12.1  HCT 37.1 38.0 37.1 36.0 36.7  MCV 87.7 89.4 89.2 87.8 88.2  PLT 293 330 390 373 420*   Cardiac Enzymes: No results for  input(s): "CKTOTAL", "CKMB", "CKMBINDEX", "TROPONINI" in the last 168 hours. BNP: Invalid input(s): "POCBNP" CBG: No results for input(s): "GLUCAP" in the last 168 hours. D-Dimer No results for input(s): "DDIMER" in the last 72 hours. Hgb A1c No results for input(s): "HGBA1C" in the last 72 hours. Lipid Profile No results for input(s): "CHOL", "HDL", "LDLCALC", "TRIG", "CHOLHDL", "LDLDIRECT" in the last 72 hours. Thyroid function studies No results for input(s): "TSH", "T4TOTAL", "T3FREE", "THYROIDAB" in the last 72 hours.  Invalid input(s): "FREET3" Anemia work up No results for input(s): "VITAMINB12", "FOLATE", "FERRITIN", "TIBC", "IRON", "RETICCTPCT" in the last 72 hours. Urinalysis    Component Value Date/Time   COLORURINE YELLOW 07/05/2023 1514   APPEARANCEUR CLEAR 07/05/2023 1514   LABSPEC 1.044 (H) 07/05/2023 1514   PHURINE 6.5 07/05/2023 1514   GLUCOSEU NEGATIVE 07/05/2023 1514   HGBUR NEGATIVE 07/05/2023 1514   BILIRUBINUR NEGATIVE 07/05/2023 1514   KETONESUR NEGATIVE 07/05/2023 1514   PROTEINUR TRACE (A)  07/05/2023 1514   UROBILINOGEN 0.2 01/07/2017 1655   NITRITE NEGATIVE 07/05/2023 1514   LEUKOCYTESUR NEGATIVE 07/05/2023 1514   Sepsis Labs Recent Labs  Lab 07/11/23 0528 07/12/23 0851 07/13/23 0709 07/14/23 0433  WBC 15.1* 11.9* 9.4 9.1   Microbiology Recent Results (from the past 240 hour(s))  Surgical PCR screen     Status: None   Collection Time: 07/11/23  9:27 PM   Specimen: Nasal Mucosa; Nasal Swab  Result Value Ref Range Status   MRSA, PCR NEGATIVE NEGATIVE Final   Staphylococcus aureus NEGATIVE NEGATIVE Final    Comment: (NOTE) The Xpert SA Assay (FDA approved for NASAL specimens in patients 22 years of age and older), is one component of a comprehensive surveillance program. It is not intended to diagnose infection nor to guide or monitor treatment. Performed at [facility], [address], [place], [place] [address]      Time coordinating discharge: 35 minutes  SIGNED:   Glade Lloyd, MD  [facility] 07/14/2023, 10:28 AM

## 2023-07-14 NOTE — Progress Notes (Signed)
Per pt Tylenol does not relieve the pain she is having in her abdomen as much. She wants to get something stronger. Notified MD via secure chat and requested for an order for oxycodone.

## 2023-07-15 ENCOUNTER — Encounter (HOSPITAL_COMMUNITY): Payer: Self-pay | Admitting: Gastroenterology

## 2023-07-15 NOTE — Progress Notes (Signed)
Mobility Specialist: Progress Note   07/15/23 0905  Mobility  Activity Ambulated with assistance in hallway  Level of Assistance Standby assist, set-up cues, supervision of patient - no hands on  Assistive Device Other (Comment) (IV pole)  Distance Ambulated (ft) 150 ft  Activity Response Tolerated well  Mobility Referral Yes  $Mobility charge 1 Mobility  Mobility Specialist Start Time (ACUTE ONLY) [id number]  Mobility Specialist Stop Time (ACUTE ONLY) 0900  Mobility Specialist Time Calculation (min) (ACUTE ONLY) 24 min    Pt was agreeable to mobility session - received ambulating in room. Ind in room, SV for hallway ambulation. No complaints just feeling tired. Returned to room without fault. Left in bed with all needs met, call bell in reach.   Maurene Capes Mobility Specialist Please contact via SecureChat or Rehab office at [phone]

## 2023-07-15 NOTE — Progress Notes (Signed)
Patient refused to be discharged on 07/14/2023 because of abdominal pain.  Patient seen and examined at bedside and plan of care discussed with her today. She feels ok to go home today. She is medically stable for discharge home today.  Please refer to the full discharge summary done by me on 07/14/2023 for full details.

## 2023-07-15 NOTE — Progress Notes (Signed)
[name] [name] to be D/C'd  per MD order.  Discussed with the patient and all questions fully answered.  VSS, Skin clean, dry and intact without evidence of skin break down, no evidence of skin tears noted.  IV catheter discontinued intact. Site without signs and symptoms of complications. Dressing and pressure applied.  An After Visit Summary was printed and given to the patient. Patient received prescription.  D/c education completed with patient/family including follow up instructions, medication list, d/c activities limitations if indicated, with other d/c instructions as indicated by MD - patient able to verbalize understanding, all questions fully answered.   Patient instructed to return to ED, call 911, or call MD for any changes in condition.   Patient to be escorted via WC, and D/C home via private auto by sister in-law.

## 2023-07-15 NOTE — Progress Notes (Signed)
Patient alert and oriented, verbalized understanding of dc instructions. Patient now awaiting ride.

## 2023-07-15 NOTE — Plan of Care (Signed)

## 2023-07-17 DIAGNOSIS — K5730 Diverticulosis of large intestine without perforation or abscess without bleeding: Secondary | ICD-10-CM | POA: Diagnosis not present

## 2023-07-17 DIAGNOSIS — I129 Hypertensive chronic kidney disease with stage 1 through stage 4 chronic kidney disease, or unspecified chronic kidney disease: Secondary | ICD-10-CM | POA: Diagnosis not present

## 2023-07-17 DIAGNOSIS — E66811 Obesity, class 1: Secondary | ICD-10-CM | POA: Diagnosis not present

## 2023-07-17 DIAGNOSIS — J90 Pleural effusion, not elsewhere classified: Secondary | ICD-10-CM | POA: Diagnosis not present

## 2023-07-17 DIAGNOSIS — Z86718 Personal history of other venous thrombosis and embolism: Secondary | ICD-10-CM | POA: Diagnosis not present

## 2023-07-17 DIAGNOSIS — D75839 Thrombocytosis, unspecified: Secondary | ICD-10-CM | POA: Diagnosis not present

## 2023-07-17 DIAGNOSIS — K56691 Other complete intestinal obstruction: Secondary | ICD-10-CM | POA: Diagnosis not present

## 2023-07-17 DIAGNOSIS — F419 Anxiety disorder, unspecified: Secondary | ICD-10-CM | POA: Diagnosis not present

## 2023-07-17 DIAGNOSIS — K219 Gastro-esophageal reflux disease without esophagitis: Secondary | ICD-10-CM | POA: Diagnosis not present

## 2023-07-17 DIAGNOSIS — I700 Atherosclerosis of aorta: Secondary | ICD-10-CM | POA: Diagnosis not present

## 2023-07-17 DIAGNOSIS — Z6832 Body mass index (BMI) 32.0-32.9, adult: Secondary | ICD-10-CM | POA: Diagnosis not present

## 2023-07-17 DIAGNOSIS — J45909 Unspecified asthma, uncomplicated: Secondary | ICD-10-CM | POA: Diagnosis not present

## 2023-07-17 DIAGNOSIS — Z87891 Personal history of nicotine dependence: Secondary | ICD-10-CM | POA: Diagnosis not present

## 2023-07-17 DIAGNOSIS — F32A Depression, unspecified: Secondary | ICD-10-CM | POA: Diagnosis not present

## 2023-07-17 DIAGNOSIS — G4733 Obstructive sleep apnea (adult) (pediatric): Secondary | ICD-10-CM | POA: Diagnosis not present

## 2023-07-17 DIAGNOSIS — E890 Postprocedural hypothyroidism: Secondary | ICD-10-CM | POA: Diagnosis not present

## 2023-07-17 DIAGNOSIS — Z9181 History of falling: Secondary | ICD-10-CM | POA: Diagnosis not present

## 2023-07-17 DIAGNOSIS — J9811 Atelectasis: Secondary | ICD-10-CM | POA: Diagnosis not present

## 2023-07-17 DIAGNOSIS — Z941 Heart transplant status: Secondary | ICD-10-CM | POA: Diagnosis not present

## 2023-07-17 DIAGNOSIS — N1831 Chronic kidney disease, stage 3a: Secondary | ICD-10-CM | POA: Diagnosis not present

## 2023-07-17 DIAGNOSIS — M810 Age-related osteoporosis without current pathological fracture: Secondary | ICD-10-CM | POA: Diagnosis not present

## 2023-07-17 DIAGNOSIS — K449 Diaphragmatic hernia without obstruction or gangrene: Secondary | ICD-10-CM | POA: Diagnosis not present

## 2023-07-20 DIAGNOSIS — F32A Depression, unspecified: Secondary | ICD-10-CM | POA: Diagnosis not present

## 2023-07-20 DIAGNOSIS — I129 Hypertensive chronic kidney disease with stage 1 through stage 4 chronic kidney disease, or unspecified chronic kidney disease: Secondary | ICD-10-CM | POA: Diagnosis not present

## 2023-07-20 DIAGNOSIS — K56691 Other complete intestinal obstruction: Secondary | ICD-10-CM | POA: Diagnosis not present

## 2023-07-20 DIAGNOSIS — N1831 Chronic kidney disease, stage 3a: Secondary | ICD-10-CM | POA: Diagnosis not present

## 2023-07-20 DIAGNOSIS — F419 Anxiety disorder, unspecified: Secondary | ICD-10-CM | POA: Diagnosis not present

## 2023-07-20 DIAGNOSIS — K5730 Diverticulosis of large intestine without perforation or abscess without bleeding: Secondary | ICD-10-CM | POA: Diagnosis not present

## 2023-07-20 DIAGNOSIS — K449 Diaphragmatic hernia without obstruction or gangrene: Secondary | ICD-10-CM | POA: Diagnosis not present

## 2023-07-20 DIAGNOSIS — D75839 Thrombocytosis, unspecified: Secondary | ICD-10-CM | POA: Diagnosis not present

## 2023-07-20 DIAGNOSIS — J90 Pleural effusion, not elsewhere classified: Secondary | ICD-10-CM | POA: Diagnosis not present

## 2023-07-20 DIAGNOSIS — M810 Age-related osteoporosis without current pathological fracture: Secondary | ICD-10-CM | POA: Diagnosis not present

## 2023-07-20 DIAGNOSIS — J9811 Atelectasis: Secondary | ICD-10-CM | POA: Diagnosis not present

## 2023-07-20 DIAGNOSIS — I700 Atherosclerosis of aorta: Secondary | ICD-10-CM | POA: Diagnosis not present

## 2023-07-20 DIAGNOSIS — J45909 Unspecified asthma, uncomplicated: Secondary | ICD-10-CM | POA: Diagnosis not present

## 2023-07-20 DIAGNOSIS — K219 Gastro-esophageal reflux disease without esophagitis: Secondary | ICD-10-CM | POA: Diagnosis not present

## 2023-07-20 DIAGNOSIS — E890 Postprocedural hypothyroidism: Secondary | ICD-10-CM | POA: Diagnosis not present

## 2023-07-24 DIAGNOSIS — N1831 Chronic kidney disease, stage 3a: Secondary | ICD-10-CM | POA: Diagnosis not present

## 2023-07-24 DIAGNOSIS — D75839 Thrombocytosis, unspecified: Secondary | ICD-10-CM | POA: Diagnosis not present

## 2023-07-24 DIAGNOSIS — J45909 Unspecified asthma, uncomplicated: Secondary | ICD-10-CM | POA: Diagnosis not present

## 2023-07-24 DIAGNOSIS — I129 Hypertensive chronic kidney disease with stage 1 through stage 4 chronic kidney disease, or unspecified chronic kidney disease: Secondary | ICD-10-CM | POA: Diagnosis not present

## 2023-07-24 DIAGNOSIS — J90 Pleural effusion, not elsewhere classified: Secondary | ICD-10-CM | POA: Diagnosis not present

## 2023-07-24 DIAGNOSIS — K5730 Diverticulosis of large intestine without perforation or abscess without bleeding: Secondary | ICD-10-CM | POA: Diagnosis not present

## 2023-07-24 DIAGNOSIS — K449 Diaphragmatic hernia without obstruction or gangrene: Secondary | ICD-10-CM | POA: Diagnosis not present

## 2023-07-24 DIAGNOSIS — J9811 Atelectasis: Secondary | ICD-10-CM | POA: Diagnosis not present

## 2023-07-24 DIAGNOSIS — M810 Age-related osteoporosis without current pathological fracture: Secondary | ICD-10-CM | POA: Diagnosis not present

## 2023-07-24 DIAGNOSIS — K56691 Other complete intestinal obstruction: Secondary | ICD-10-CM | POA: Diagnosis not present

## 2023-07-24 DIAGNOSIS — E890 Postprocedural hypothyroidism: Secondary | ICD-10-CM | POA: Diagnosis not present

## 2023-07-24 DIAGNOSIS — K219 Gastro-esophageal reflux disease without esophagitis: Secondary | ICD-10-CM | POA: Diagnosis not present

## 2023-07-24 DIAGNOSIS — F419 Anxiety disorder, unspecified: Secondary | ICD-10-CM | POA: Diagnosis not present

## 2023-07-24 DIAGNOSIS — F32A Depression, unspecified: Secondary | ICD-10-CM | POA: Diagnosis not present

## 2023-07-24 DIAGNOSIS — I700 Atherosclerosis of aorta: Secondary | ICD-10-CM | POA: Diagnosis not present

## 2023-07-25 DIAGNOSIS — R110 Nausea: Secondary | ICD-10-CM | POA: Diagnosis not present

## 2023-07-25 DIAGNOSIS — K56609 Unspecified intestinal obstruction, unspecified as to partial versus complete obstruction: Secondary | ICD-10-CM | POA: Diagnosis not present

## 2023-07-31 DIAGNOSIS — D75839 Thrombocytosis, unspecified: Secondary | ICD-10-CM | POA: Diagnosis not present

## 2023-07-31 DIAGNOSIS — M810 Age-related osteoporosis without current pathological fracture: Secondary | ICD-10-CM | POA: Diagnosis not present

## 2023-07-31 DIAGNOSIS — E890 Postprocedural hypothyroidism: Secondary | ICD-10-CM | POA: Diagnosis not present

## 2023-07-31 DIAGNOSIS — K56691 Other complete intestinal obstruction: Secondary | ICD-10-CM | POA: Diagnosis not present

## 2023-07-31 DIAGNOSIS — K219 Gastro-esophageal reflux disease without esophagitis: Secondary | ICD-10-CM | POA: Diagnosis not present

## 2023-07-31 DIAGNOSIS — J9811 Atelectasis: Secondary | ICD-10-CM | POA: Diagnosis not present

## 2023-07-31 DIAGNOSIS — K449 Diaphragmatic hernia without obstruction or gangrene: Secondary | ICD-10-CM | POA: Diagnosis not present

## 2023-07-31 DIAGNOSIS — N1831 Chronic kidney disease, stage 3a: Secondary | ICD-10-CM | POA: Diagnosis not present

## 2023-07-31 DIAGNOSIS — I129 Hypertensive chronic kidney disease with stage 1 through stage 4 chronic kidney disease, or unspecified chronic kidney disease: Secondary | ICD-10-CM | POA: Diagnosis not present

## 2023-07-31 DIAGNOSIS — F419 Anxiety disorder, unspecified: Secondary | ICD-10-CM | POA: Diagnosis not present

## 2023-07-31 DIAGNOSIS — F32A Depression, unspecified: Secondary | ICD-10-CM | POA: Diagnosis not present

## 2023-07-31 DIAGNOSIS — J45909 Unspecified asthma, uncomplicated: Secondary | ICD-10-CM | POA: Diagnosis not present

## 2023-07-31 DIAGNOSIS — K5730 Diverticulosis of large intestine without perforation or abscess without bleeding: Secondary | ICD-10-CM | POA: Diagnosis not present

## 2023-07-31 DIAGNOSIS — J90 Pleural effusion, not elsewhere classified: Secondary | ICD-10-CM | POA: Diagnosis not present

## 2023-07-31 DIAGNOSIS — I700 Atherosclerosis of aorta: Secondary | ICD-10-CM | POA: Diagnosis not present

## 2023-08-02 ENCOUNTER — Other Ambulatory Visit: Payer: Self-pay | Admitting: Gastroenterology

## 2023-08-02 DIAGNOSIS — R9389 Abnormal findings on diagnostic imaging of other specified body structures: Secondary | ICD-10-CM

## 2023-08-02 DIAGNOSIS — R1030 Lower abdominal pain, unspecified: Secondary | ICD-10-CM

## 2023-08-03 ENCOUNTER — Encounter: Payer: Self-pay | Admitting: Gastroenterology

## 2023-08-03 DIAGNOSIS — M810 Age-related osteoporosis without current pathological fracture: Secondary | ICD-10-CM | POA: Diagnosis not present

## 2023-08-03 DIAGNOSIS — N1831 Chronic kidney disease, stage 3a: Secondary | ICD-10-CM | POA: Diagnosis not present

## 2023-08-03 DIAGNOSIS — K5730 Diverticulosis of large intestine without perforation or abscess without bleeding: Secondary | ICD-10-CM | POA: Diagnosis not present

## 2023-08-03 DIAGNOSIS — E890 Postprocedural hypothyroidism: Secondary | ICD-10-CM | POA: Diagnosis not present

## 2023-08-03 DIAGNOSIS — K56691 Other complete intestinal obstruction: Secondary | ICD-10-CM | POA: Diagnosis not present

## 2023-08-03 DIAGNOSIS — J90 Pleural effusion, not elsewhere classified: Secondary | ICD-10-CM | POA: Diagnosis not present

## 2023-08-03 DIAGNOSIS — J9811 Atelectasis: Secondary | ICD-10-CM | POA: Diagnosis not present

## 2023-08-03 DIAGNOSIS — K219 Gastro-esophageal reflux disease without esophagitis: Secondary | ICD-10-CM | POA: Diagnosis not present

## 2023-08-03 DIAGNOSIS — J45909 Unspecified asthma, uncomplicated: Secondary | ICD-10-CM | POA: Diagnosis not present

## 2023-08-03 DIAGNOSIS — D75839 Thrombocytosis, unspecified: Secondary | ICD-10-CM | POA: Diagnosis not present

## 2023-08-03 DIAGNOSIS — K449 Diaphragmatic hernia without obstruction or gangrene: Secondary | ICD-10-CM | POA: Diagnosis not present

## 2023-08-03 DIAGNOSIS — F419 Anxiety disorder, unspecified: Secondary | ICD-10-CM | POA: Diagnosis not present

## 2023-08-03 DIAGNOSIS — I700 Atherosclerosis of aorta: Secondary | ICD-10-CM | POA: Diagnosis not present

## 2023-08-03 DIAGNOSIS — F32A Depression, unspecified: Secondary | ICD-10-CM | POA: Diagnosis not present

## 2023-08-03 DIAGNOSIS — I129 Hypertensive chronic kidney disease with stage 1 through stage 4 chronic kidney disease, or unspecified chronic kidney disease: Secondary | ICD-10-CM | POA: Diagnosis not present

## 2023-08-08 DIAGNOSIS — D75839 Thrombocytosis, unspecified: Secondary | ICD-10-CM | POA: Diagnosis not present

## 2023-08-08 DIAGNOSIS — N1831 Chronic kidney disease, stage 3a: Secondary | ICD-10-CM | POA: Diagnosis not present

## 2023-08-08 DIAGNOSIS — I129 Hypertensive chronic kidney disease with stage 1 through stage 4 chronic kidney disease, or unspecified chronic kidney disease: Secondary | ICD-10-CM | POA: Diagnosis not present

## 2023-08-08 DIAGNOSIS — F32A Depression, unspecified: Secondary | ICD-10-CM | POA: Diagnosis not present

## 2023-08-08 DIAGNOSIS — K5730 Diverticulosis of large intestine without perforation or abscess without bleeding: Secondary | ICD-10-CM | POA: Diagnosis not present

## 2023-08-08 DIAGNOSIS — I700 Atherosclerosis of aorta: Secondary | ICD-10-CM | POA: Diagnosis not present

## 2023-08-08 DIAGNOSIS — K219 Gastro-esophageal reflux disease without esophagitis: Secondary | ICD-10-CM | POA: Diagnosis not present

## 2023-08-08 DIAGNOSIS — K56691 Other complete intestinal obstruction: Secondary | ICD-10-CM | POA: Diagnosis not present

## 2023-08-08 DIAGNOSIS — M810 Age-related osteoporosis without current pathological fracture: Secondary | ICD-10-CM | POA: Diagnosis not present

## 2023-08-08 DIAGNOSIS — J90 Pleural effusion, not elsewhere classified: Secondary | ICD-10-CM | POA: Diagnosis not present

## 2023-08-08 DIAGNOSIS — J9811 Atelectasis: Secondary | ICD-10-CM | POA: Diagnosis not present

## 2023-08-08 DIAGNOSIS — E890 Postprocedural hypothyroidism: Secondary | ICD-10-CM | POA: Diagnosis not present

## 2023-08-08 DIAGNOSIS — F419 Anxiety disorder, unspecified: Secondary | ICD-10-CM | POA: Diagnosis not present

## 2023-08-08 DIAGNOSIS — K449 Diaphragmatic hernia without obstruction or gangrene: Secondary | ICD-10-CM | POA: Diagnosis not present

## 2023-08-08 DIAGNOSIS — J45909 Unspecified asthma, uncomplicated: Secondary | ICD-10-CM | POA: Diagnosis not present

## 2023-08-10 DIAGNOSIS — J9811 Atelectasis: Secondary | ICD-10-CM | POA: Diagnosis not present

## 2023-08-10 DIAGNOSIS — M810 Age-related osteoporosis without current pathological fracture: Secondary | ICD-10-CM | POA: Diagnosis not present

## 2023-08-10 DIAGNOSIS — K5730 Diverticulosis of large intestine without perforation or abscess without bleeding: Secondary | ICD-10-CM | POA: Diagnosis not present

## 2023-08-10 DIAGNOSIS — J45909 Unspecified asthma, uncomplicated: Secondary | ICD-10-CM | POA: Diagnosis not present

## 2023-08-10 DIAGNOSIS — K219 Gastro-esophageal reflux disease without esophagitis: Secondary | ICD-10-CM | POA: Diagnosis not present

## 2023-08-10 DIAGNOSIS — J90 Pleural effusion, not elsewhere classified: Secondary | ICD-10-CM | POA: Diagnosis not present

## 2023-08-10 DIAGNOSIS — D75839 Thrombocytosis, unspecified: Secondary | ICD-10-CM | POA: Diagnosis not present

## 2023-08-10 DIAGNOSIS — K449 Diaphragmatic hernia without obstruction or gangrene: Secondary | ICD-10-CM | POA: Diagnosis not present

## 2023-08-10 DIAGNOSIS — I129 Hypertensive chronic kidney disease with stage 1 through stage 4 chronic kidney disease, or unspecified chronic kidney disease: Secondary | ICD-10-CM | POA: Diagnosis not present

## 2023-08-10 DIAGNOSIS — F32A Depression, unspecified: Secondary | ICD-10-CM | POA: Diagnosis not present

## 2023-08-10 DIAGNOSIS — K56691 Other complete intestinal obstruction: Secondary | ICD-10-CM | POA: Diagnosis not present

## 2023-08-10 DIAGNOSIS — E890 Postprocedural hypothyroidism: Secondary | ICD-10-CM | POA: Diagnosis not present

## 2023-08-10 DIAGNOSIS — F419 Anxiety disorder, unspecified: Secondary | ICD-10-CM | POA: Diagnosis not present

## 2023-08-10 DIAGNOSIS — I700 Atherosclerosis of aorta: Secondary | ICD-10-CM | POA: Diagnosis not present

## 2023-08-10 DIAGNOSIS — N1831 Chronic kidney disease, stage 3a: Secondary | ICD-10-CM | POA: Diagnosis not present

## 2023-08-13 DIAGNOSIS — I700 Atherosclerosis of aorta: Secondary | ICD-10-CM | POA: Diagnosis not present

## 2023-08-13 DIAGNOSIS — K449 Diaphragmatic hernia without obstruction or gangrene: Secondary | ICD-10-CM | POA: Diagnosis not present

## 2023-08-13 DIAGNOSIS — J9811 Atelectasis: Secondary | ICD-10-CM | POA: Diagnosis not present

## 2023-08-13 DIAGNOSIS — K56691 Other complete intestinal obstruction: Secondary | ICD-10-CM | POA: Diagnosis not present

## 2023-08-13 DIAGNOSIS — J45909 Unspecified asthma, uncomplicated: Secondary | ICD-10-CM | POA: Diagnosis not present

## 2023-08-13 DIAGNOSIS — D75839 Thrombocytosis, unspecified: Secondary | ICD-10-CM | POA: Diagnosis not present

## 2023-08-13 DIAGNOSIS — K219 Gastro-esophageal reflux disease without esophagitis: Secondary | ICD-10-CM | POA: Diagnosis not present

## 2023-08-13 DIAGNOSIS — K5730 Diverticulosis of large intestine without perforation or abscess without bleeding: Secondary | ICD-10-CM | POA: Diagnosis not present

## 2023-08-13 DIAGNOSIS — J90 Pleural effusion, not elsewhere classified: Secondary | ICD-10-CM | POA: Diagnosis not present

## 2023-08-13 DIAGNOSIS — F419 Anxiety disorder, unspecified: Secondary | ICD-10-CM | POA: Diagnosis not present

## 2023-08-13 DIAGNOSIS — M810 Age-related osteoporosis without current pathological fracture: Secondary | ICD-10-CM | POA: Diagnosis not present

## 2023-08-13 DIAGNOSIS — E890 Postprocedural hypothyroidism: Secondary | ICD-10-CM | POA: Diagnosis not present

## 2023-08-13 DIAGNOSIS — F32A Depression, unspecified: Secondary | ICD-10-CM | POA: Diagnosis not present

## 2023-08-13 DIAGNOSIS — N1831 Chronic kidney disease, stage 3a: Secondary | ICD-10-CM | POA: Diagnosis not present

## 2023-08-13 DIAGNOSIS — I129 Hypertensive chronic kidney disease with stage 1 through stage 4 chronic kidney disease, or unspecified chronic kidney disease: Secondary | ICD-10-CM | POA: Diagnosis not present

## 2023-08-15 ENCOUNTER — Other Ambulatory Visit: Payer: Medicare Other

## 2023-08-27 ENCOUNTER — Other Ambulatory Visit: Payer: Medicare Other

## 2023-08-27 DIAGNOSIS — K56691 Other complete intestinal obstruction: Secondary | ICD-10-CM | POA: Diagnosis not present

## 2023-08-28 DIAGNOSIS — Z941 Heart transplant status: Secondary | ICD-10-CM | POA: Diagnosis not present

## 2023-08-28 DIAGNOSIS — M810 Age-related osteoporosis without current pathological fracture: Secondary | ICD-10-CM | POA: Diagnosis not present

## 2023-08-28 DIAGNOSIS — F419 Anxiety disorder, unspecified: Secondary | ICD-10-CM | POA: Diagnosis not present

## 2023-08-28 DIAGNOSIS — Z86718 Personal history of other venous thrombosis and embolism: Secondary | ICD-10-CM | POA: Diagnosis not present

## 2023-08-28 DIAGNOSIS — I129 Hypertensive chronic kidney disease with stage 1 through stage 4 chronic kidney disease, or unspecified chronic kidney disease: Secondary | ICD-10-CM | POA: Diagnosis not present

## 2023-08-28 DIAGNOSIS — Z6832 Body mass index (BMI) 32.0-32.9, adult: Secondary | ICD-10-CM | POA: Diagnosis not present

## 2023-08-28 DIAGNOSIS — K5730 Diverticulosis of large intestine without perforation or abscess without bleeding: Secondary | ICD-10-CM | POA: Diagnosis not present

## 2023-08-28 DIAGNOSIS — D75839 Thrombocytosis, unspecified: Secondary | ICD-10-CM | POA: Diagnosis not present

## 2023-08-28 DIAGNOSIS — J90 Pleural effusion, not elsewhere classified: Secondary | ICD-10-CM | POA: Diagnosis not present

## 2023-08-28 DIAGNOSIS — J9811 Atelectasis: Secondary | ICD-10-CM | POA: Diagnosis not present

## 2023-08-28 DIAGNOSIS — I700 Atherosclerosis of aorta: Secondary | ICD-10-CM | POA: Diagnosis not present

## 2023-08-28 DIAGNOSIS — K449 Diaphragmatic hernia without obstruction or gangrene: Secondary | ICD-10-CM | POA: Diagnosis not present

## 2023-08-28 DIAGNOSIS — F32A Depression, unspecified: Secondary | ICD-10-CM | POA: Diagnosis not present

## 2023-08-28 DIAGNOSIS — K56691 Other complete intestinal obstruction: Secondary | ICD-10-CM | POA: Diagnosis not present

## 2023-08-28 DIAGNOSIS — E66811 Obesity, class 1: Secondary | ICD-10-CM | POA: Diagnosis not present

## 2023-08-28 DIAGNOSIS — K219 Gastro-esophageal reflux disease without esophagitis: Secondary | ICD-10-CM | POA: Diagnosis not present

## 2023-08-28 DIAGNOSIS — G4733 Obstructive sleep apnea (adult) (pediatric): Secondary | ICD-10-CM | POA: Diagnosis not present

## 2023-08-28 DIAGNOSIS — N1831 Chronic kidney disease, stage 3a: Secondary | ICD-10-CM | POA: Diagnosis not present

## 2023-08-28 DIAGNOSIS — Z87891 Personal history of nicotine dependence: Secondary | ICD-10-CM | POA: Diagnosis not present

## 2023-08-28 DIAGNOSIS — J45909 Unspecified asthma, uncomplicated: Secondary | ICD-10-CM | POA: Diagnosis not present

## 2023-08-28 DIAGNOSIS — E890 Postprocedural hypothyroidism: Secondary | ICD-10-CM | POA: Diagnosis not present

## 2023-08-28 DIAGNOSIS — Z9181 History of falling: Secondary | ICD-10-CM | POA: Diagnosis not present

## 2023-09-13 ENCOUNTER — Ambulatory Visit
Admission: RE | Admit: 2023-09-13 | Discharge: 2023-09-13 | Disposition: A | Discharge status: Home / Self Care | Payer: Medicare Other | Source: Ambulatory Visit | Attending: Gastroenterology | Admitting: Gastroenterology

## 2023-09-13 DIAGNOSIS — R1030 Lower abdominal pain, unspecified: Secondary | ICD-10-CM

## 2023-09-13 DIAGNOSIS — E785 Hyperlipidemia, unspecified: Secondary | ICD-10-CM | POA: Diagnosis not present

## 2023-09-13 DIAGNOSIS — R9389 Abnormal findings on diagnostic imaging of other specified body structures: Secondary | ICD-10-CM

## 2023-09-13 MED ORDER — IOPAMIDOL (ISOVUE-370) INJECTION 76%
80.0000 mL | Freq: Once | INTRAVENOUS | Status: AC | PRN
  Administered 2023-09-13: 80 mL via INTRAVENOUS

## 2023-09-14 DIAGNOSIS — M810 Age-related osteoporosis without current pathological fracture: Secondary | ICD-10-CM | POA: Diagnosis not present

## 2023-09-14 DIAGNOSIS — E785 Hyperlipidemia, unspecified: Secondary | ICD-10-CM | POA: Diagnosis not present

## 2023-09-14 DIAGNOSIS — E039 Hypothyroidism, unspecified: Secondary | ICD-10-CM | POA: Diagnosis not present

## 2023-09-14 DIAGNOSIS — R7301 Impaired fasting glucose: Secondary | ICD-10-CM | POA: Diagnosis not present

## 2023-09-20 DIAGNOSIS — I10 Essential (primary) hypertension: Secondary | ICD-10-CM | POA: Diagnosis not present

## 2023-09-20 DIAGNOSIS — Z1331 Encounter for screening for depression: Secondary | ICD-10-CM | POA: Diagnosis not present

## 2023-09-20 DIAGNOSIS — Z1339 Encounter for screening examination for other mental health and behavioral disorders: Secondary | ICD-10-CM | POA: Diagnosis not present

## 2023-09-20 DIAGNOSIS — I129 Hypertensive chronic kidney disease with stage 1 through stage 4 chronic kidney disease, or unspecified chronic kidney disease: Secondary | ICD-10-CM | POA: Diagnosis not present

## 2023-09-20 DIAGNOSIS — Z0000 Encounter for general adult medical examination without abnormal findings: Secondary | ICD-10-CM | POA: Diagnosis not present

## 2023-10-24 DIAGNOSIS — Z79899 Other long term (current) drug therapy: Secondary | ICD-10-CM | POA: Diagnosis not present

## 2023-10-24 DIAGNOSIS — Z941 Heart transplant status: Secondary | ICD-10-CM | POA: Diagnosis not present

## 2023-10-25 DIAGNOSIS — Z941 Heart transplant status: Secondary | ICD-10-CM | POA: Diagnosis not present

## 2023-10-25 DIAGNOSIS — I083 Combined rheumatic disorders of mitral, aortic and tricuspid valves: Secondary | ICD-10-CM | POA: Diagnosis not present

## 2023-10-25 DIAGNOSIS — I080 Rheumatic disorders of both mitral and aortic valves: Secondary | ICD-10-CM | POA: Diagnosis not present

## 2023-11-15 DIAGNOSIS — D485 Neoplasm of uncertain behavior of skin: Secondary | ICD-10-CM | POA: Diagnosis not present

## 2023-11-15 DIAGNOSIS — L821 Other seborrheic keratosis: Secondary | ICD-10-CM | POA: Diagnosis not present

## 2023-11-15 DIAGNOSIS — C44329 Squamous cell carcinoma of skin of other parts of face: Secondary | ICD-10-CM | POA: Diagnosis not present

## 2023-11-23 DIAGNOSIS — Z941 Heart transplant status: Secondary | ICD-10-CM | POA: Diagnosis not present

## 2023-11-23 DIAGNOSIS — I2510 Atherosclerotic heart disease of native coronary artery without angina pectoris: Secondary | ICD-10-CM | POA: Diagnosis not present

## 2023-11-23 DIAGNOSIS — R0600 Dyspnea, unspecified: Secondary | ICD-10-CM | POA: Diagnosis not present

## 2023-11-23 DIAGNOSIS — I25811 Atherosclerosis of native coronary artery of transplanted heart without angina pectoris: Secondary | ICD-10-CM | POA: Diagnosis not present

## 2023-11-23 DIAGNOSIS — R9431 Abnormal electrocardiogram [ECG] [EKG]: Secondary | ICD-10-CM | POA: Diagnosis not present

## 2023-11-23 DIAGNOSIS — Z4821 Encounter for aftercare following heart transplant: Secondary | ICD-10-CM | POA: Diagnosis not present

## 2023-11-24 ENCOUNTER — Inpatient Hospital Stay (HOSPITAL_COMMUNITY)
Admission: EM | Admit: 2023-11-24 | Discharge: 2023-11-29 | DRG: 253 | Disposition: A | Discharge status: Home Health | Source: Skilled Nursing Facility | Attending: Internal Medicine | Admitting: Internal Medicine

## 2023-11-24 ENCOUNTER — Emergency Department (HOSPITAL_COMMUNITY)

## 2023-11-24 ENCOUNTER — Encounter (HOSPITAL_COMMUNITY): Payer: Self-pay

## 2023-11-24 ENCOUNTER — Other Ambulatory Visit: Payer: Self-pay

## 2023-11-24 DIAGNOSIS — G4733 Obstructive sleep apnea (adult) (pediatric): Secondary | ICD-10-CM | POA: Diagnosis present

## 2023-11-24 DIAGNOSIS — K5900 Constipation, unspecified: Secondary | ICD-10-CM | POA: Diagnosis not present

## 2023-11-24 DIAGNOSIS — E039 Hypothyroidism, unspecified: Secondary | ICD-10-CM | POA: Diagnosis present

## 2023-11-24 DIAGNOSIS — Z90710 Acquired absence of both cervix and uterus: Secondary | ICD-10-CM | POA: Diagnosis not present

## 2023-11-24 DIAGNOSIS — Z86718 Personal history of other venous thrombosis and embolism: Secondary | ICD-10-CM

## 2023-11-24 DIAGNOSIS — I509 Heart failure, unspecified: Secondary | ICD-10-CM | POA: Diagnosis not present

## 2023-11-24 DIAGNOSIS — Z79890 Hormone replacement therapy (postmenopausal): Secondary | ICD-10-CM | POA: Diagnosis not present

## 2023-11-24 DIAGNOSIS — I480 Paroxysmal atrial fibrillation: Secondary | ICD-10-CM | POA: Diagnosis present

## 2023-11-24 DIAGNOSIS — Z8249 Family history of ischemic heart disease and other diseases of the circulatory system: Secondary | ICD-10-CM | POA: Diagnosis not present

## 2023-11-24 DIAGNOSIS — Y838 Other surgical procedures as the cause of abnormal reaction of the patient, or of later complication, without mention of misadventure at the time of the procedure: Secondary | ICD-10-CM | POA: Diagnosis present

## 2023-11-24 DIAGNOSIS — I25811 Atherosclerosis of native coronary artery of transplanted heart without angina pectoris: Secondary | ICD-10-CM | POA: Diagnosis present

## 2023-11-24 DIAGNOSIS — D649 Anemia, unspecified: Secondary | ICD-10-CM

## 2023-11-24 DIAGNOSIS — Z7901 Long term (current) use of anticoagulants: Secondary | ICD-10-CM | POA: Diagnosis not present

## 2023-11-24 DIAGNOSIS — Z9079 Acquired absence of other genital organ(s): Secondary | ICD-10-CM | POA: Diagnosis not present

## 2023-11-24 DIAGNOSIS — N179 Acute kidney failure, unspecified: Secondary | ICD-10-CM | POA: Diagnosis not present

## 2023-11-24 DIAGNOSIS — F32A Depression, unspecified: Secondary | ICD-10-CM | POA: Diagnosis present

## 2023-11-24 DIAGNOSIS — Z79624 Long term (current) use of inhibitors of nucleotide synthesis: Secondary | ICD-10-CM

## 2023-11-24 DIAGNOSIS — T81718A Complication of other artery following a procedure, not elsewhere classified, initial encounter: Secondary | ICD-10-CM | POA: Diagnosis not present

## 2023-11-24 DIAGNOSIS — M810 Age-related osteoporosis without current pathological fracture: Secondary | ICD-10-CM | POA: Diagnosis present

## 2023-11-24 DIAGNOSIS — Z87891 Personal history of nicotine dependence: Secondary | ICD-10-CM

## 2023-11-24 DIAGNOSIS — Z90722 Acquired absence of ovaries, bilateral: Secondary | ICD-10-CM | POA: Diagnosis not present

## 2023-11-24 DIAGNOSIS — Z950 Presence of cardiac pacemaker: Secondary | ICD-10-CM | POA: Diagnosis not present

## 2023-11-24 DIAGNOSIS — Z941 Heart transplant status: Secondary | ICD-10-CM | POA: Diagnosis not present

## 2023-11-24 DIAGNOSIS — I724 Aneurysm of artery of lower extremity: Secondary | ICD-10-CM | POA: Diagnosis not present

## 2023-11-24 DIAGNOSIS — I729 Aneurysm of unspecified site: Secondary | ICD-10-CM | POA: Diagnosis not present

## 2023-11-24 DIAGNOSIS — G8929 Other chronic pain: Secondary | ICD-10-CM | POA: Diagnosis present

## 2023-11-24 DIAGNOSIS — K449 Diaphragmatic hernia without obstruction or gangrene: Secondary | ICD-10-CM | POA: Diagnosis not present

## 2023-11-24 DIAGNOSIS — D62 Acute posthemorrhagic anemia: Secondary | ICD-10-CM | POA: Diagnosis present

## 2023-11-24 DIAGNOSIS — E66811 Obesity, class 1: Secondary | ICD-10-CM | POA: Diagnosis present

## 2023-11-24 DIAGNOSIS — D72823 Leukemoid reaction: Secondary | ICD-10-CM | POA: Diagnosis not present

## 2023-11-24 DIAGNOSIS — R58 Hemorrhage, not elsewhere classified: Secondary | ICD-10-CM | POA: Diagnosis not present

## 2023-11-24 DIAGNOSIS — I110 Hypertensive heart disease with heart failure: Secondary | ICD-10-CM | POA: Diagnosis not present

## 2023-11-24 DIAGNOSIS — Z79899 Other long term (current) drug therapy: Secondary | ICD-10-CM

## 2023-11-24 DIAGNOSIS — K219 Gastro-esophageal reflux disease without esophagitis: Secondary | ICD-10-CM | POA: Diagnosis present

## 2023-11-24 DIAGNOSIS — Z6831 Body mass index (BMI) 31.0-31.9, adult: Secondary | ICD-10-CM | POA: Diagnosis not present

## 2023-11-24 DIAGNOSIS — Z800 Family history of malignant neoplasm of digestive organs: Secondary | ICD-10-CM

## 2023-11-24 DIAGNOSIS — S301XXA Contusion of abdominal wall, initial encounter: Secondary | ICD-10-CM | POA: Diagnosis not present

## 2023-11-24 DIAGNOSIS — I10 Essential (primary) hypertension: Secondary | ICD-10-CM | POA: Diagnosis present

## 2023-11-24 DIAGNOSIS — Z888 Allergy status to other drugs, medicaments and biological substances status: Secondary | ICD-10-CM

## 2023-11-24 DIAGNOSIS — I959 Hypotension, unspecified: Secondary | ICD-10-CM | POA: Diagnosis not present

## 2023-11-24 DIAGNOSIS — Z83719 Family history of colon polyps, unspecified: Secondary | ICD-10-CM

## 2023-11-24 DIAGNOSIS — I4891 Unspecified atrial fibrillation: Secondary | ICD-10-CM | POA: Diagnosis not present

## 2023-11-24 DIAGNOSIS — D72829 Elevated white blood cell count, unspecified: Secondary | ICD-10-CM | POA: Diagnosis present

## 2023-11-24 DIAGNOSIS — K5901 Slow transit constipation: Secondary | ICD-10-CM | POA: Diagnosis not present

## 2023-11-24 DIAGNOSIS — Z833 Family history of diabetes mellitus: Secondary | ICD-10-CM

## 2023-11-24 DIAGNOSIS — Z66 Do not resuscitate: Secondary | ICD-10-CM | POA: Diagnosis present

## 2023-11-24 DIAGNOSIS — K5730 Diverticulosis of large intestine without perforation or abscess without bleeding: Secondary | ICD-10-CM | POA: Diagnosis not present

## 2023-11-24 DIAGNOSIS — Z7982 Long term (current) use of aspirin: Secondary | ICD-10-CM

## 2023-11-24 DIAGNOSIS — F419 Anxiety disorder, unspecified: Secondary | ICD-10-CM | POA: Diagnosis present

## 2023-11-24 DIAGNOSIS — Z885 Allergy status to narcotic agent status: Secondary | ICD-10-CM

## 2023-11-24 LAB — CBC WITH DIFFERENTIAL/PLATELET
Abs Immature Granulocytes: 0.04 10*3/uL (ref 0.00–0.07)
Basophils Absolute: 0.1 10*3/uL (ref 0.0–0.1)
Basophils Relative: 0 %
Eosinophils Absolute: 0.2 10*3/uL (ref 0.0–0.5)
Eosinophils Relative: 2 %
HCT: 34.5 % — ABNORMAL LOW (ref 36.0–46.0)
Hemoglobin: 11.6 g/dL — ABNORMAL LOW (ref 12.0–15.0)
Immature Granulocytes: 0 %
Lymphocytes Relative: 14 %
Lymphs Abs: 2 10*3/uL (ref 0.7–4.0)
MCH: 30.4 pg (ref 26.0–34.0)
MCHC: 33.6 g/dL (ref 30.0–36.0)
MCV: 90.6 fL (ref 80.0–100.0)
Monocytes Absolute: 1.4 10*3/uL — ABNORMAL HIGH (ref 0.1–1.0)
Monocytes Relative: 10 %
Neutro Abs: 10.3 10*3/uL — ABNORMAL HIGH (ref 1.7–7.7)
Neutrophils Relative %: 74 %
Platelets: 251 10*3/uL (ref 150–400)
RBC: 3.81 MIL/uL — ABNORMAL LOW (ref 3.87–5.11)
RDW: 13.2 % (ref 11.5–15.5)
WBC: 14 10*3/uL — ABNORMAL HIGH (ref 4.0–10.5)
nRBC: 0 % (ref 0.0–0.2)

## 2023-11-24 LAB — COMPREHENSIVE METABOLIC PANEL WITH GFR
ALT: 15 U/L (ref 0–44)
AST: 20 U/L (ref 15–41)
Albumin: 2.9 g/dL — ABNORMAL LOW (ref 3.5–5.0)
Alkaline Phosphatase: 73 U/L (ref 38–126)
Anion gap: 8 (ref 5–15)
BUN: 14 mg/dL (ref 8–23)
CO2: 19 mmol/L — ABNORMAL LOW (ref 22–32)
Calcium: 8.5 mg/dL — ABNORMAL LOW (ref 8.9–10.3)
Chloride: 109 mmol/L (ref 98–111)
Creatinine, Ser: 1 mg/dL (ref 0.44–1.00)
GFR, Estimated: 56 mL/min — ABNORMAL LOW (ref >60)
Glucose, Bld: 107 mg/dL — ABNORMAL HIGH (ref 70–99)
Potassium: 3.7 mmol/L (ref 3.5–5.1)
Sodium: 136 mmol/L (ref 135–145)
Total Bilirubin: 0.7 mg/dL (ref 0.0–1.2)
Total Protein: 5.9 g/dL — ABNORMAL LOW (ref 6.5–8.1)

## 2023-11-24 MED ORDER — HYDROMORPHONE HCL 1 MG/ML IJ SOLN
0.5000 mg | Freq: Once | INTRAMUSCULAR | Status: AC
  Administered 2023-11-24: 0.5 mg via INTRAVENOUS
  Filled 2023-11-24: qty 1

## 2023-11-24 MED ORDER — IOHEXOL 350 MG/ML SOLN
75.0000 mL | Freq: Once | INTRAVENOUS | Status: AC | PRN
  Administered 2023-11-24: 75 mL via INTRAVENOUS

## 2023-11-24 MED ORDER — SENNOSIDES-DOCUSATE SODIUM 8.6-50 MG PO TABS
1.0000 | ORAL_TABLET | Freq: Every day | ORAL | Status: DC
  Administered 2023-11-25 – 2023-11-26 (×3): 1 via ORAL
  Filled 2023-11-24 (×2): qty 1

## 2023-11-24 MED ORDER — FAMOTIDINE 20 MG PO TABS
20.0000 mg | ORAL_TABLET | Freq: Two times a day (BID) | ORAL | Status: DC
  Administered 2023-11-25 – 2023-11-29 (×10): 20 mg via ORAL
  Filled 2023-11-24 (×10): qty 1

## 2023-11-24 MED ORDER — LEVOTHYROXINE SODIUM 50 MCG PO TABS
50.0000 ug | ORAL_TABLET | Freq: Every day | ORAL | Status: DC
  Administered 2023-11-25 – 2023-11-29 (×5): 50 ug via ORAL
  Filled 2023-11-24 (×5): qty 1

## 2023-11-24 MED ORDER — PANTOPRAZOLE SODIUM 40 MG PO TBEC
40.0000 mg | DELAYED_RELEASE_TABLET | Freq: Every day | ORAL | Status: DC
  Administered 2023-11-25 – 2023-11-29 (×5): 40 mg via ORAL
  Filled 2023-11-24 (×5): qty 1

## 2023-11-24 MED ORDER — DILTIAZEM HCL ER COATED BEADS 120 MG PO CP24
120.0000 mg | ORAL_CAPSULE | Freq: Two times a day (BID) | ORAL | Status: DC
  Administered 2023-11-25 – 2023-11-29 (×10): 120 mg via ORAL
  Filled 2023-11-24 (×10): qty 1

## 2023-11-24 MED ORDER — FLUTICASONE FUROATE-VILANTEROL 100-25 MCG/ACT IN AEPB: 1.0000 | INHALATION_SPRAY | Freq: Every day | RESPIRATORY_TRACT | Status: DC | PRN

## 2023-11-24 MED ORDER — ACETAMINOPHEN 650 MG RE SUPP: 650.0000 mg | Freq: Four times a day (QID) | RECTAL | Status: DC | PRN

## 2023-11-24 MED ORDER — SIMETHICONE 80 MG PO CHEW: 80.0000 mg | CHEWABLE_TABLET | Freq: Four times a day (QID) | ORAL | Status: DC | PRN

## 2023-11-24 MED ORDER — LABETALOL HCL 5 MG/ML IV SOLN: 5.0000 mg | INTRAVENOUS | Status: DC | PRN

## 2023-11-24 MED ORDER — MYCOPHENOLATE MOFETIL 250 MG PO CAPS
250.0000 mg | ORAL_CAPSULE | Freq: Two times a day (BID) | ORAL | Status: DC
  Administered 2023-11-25 – 2023-11-29 (×10): 250 mg via ORAL
  Filled 2023-11-24 (×10): qty 1

## 2023-11-24 MED ORDER — ACETAMINOPHEN 325 MG PO TABS
650.0000 mg | ORAL_TABLET | Freq: Four times a day (QID) | ORAL | Status: DC | PRN
  Administered 2023-11-24 – 2023-11-29 (×9): 650 mg via ORAL
  Filled 2023-11-24 (×9): qty 2

## 2023-11-24 MED ORDER — OXYCODONE HCL 5 MG PO TABS
5.0000 mg | ORAL_TABLET | Freq: Four times a day (QID) | ORAL | Status: DC | PRN
  Administered 2023-11-24 – 2023-11-27 (×5): 5 mg via ORAL
  Filled 2023-11-24 (×5): qty 1

## 2023-11-24 MED ORDER — NALOXONE HCL 0.4 MG/ML IJ SOLN: 0.4000 mg | INTRAMUSCULAR | Status: DC | PRN

## 2023-11-24 MED ORDER — VENLAFAXINE HCL ER 37.5 MG PO CP24
37.5000 mg | ORAL_CAPSULE | Freq: Every day | ORAL | Status: DC
  Administered 2023-11-25 – 2023-11-29 (×5): 37.5 mg via ORAL
  Filled 2023-11-24 (×5): qty 1

## 2023-11-24 MED ORDER — HYDROMORPHONE HCL 1 MG/ML IJ SOLN: 0.5000 mg | INTRAMUSCULAR | Status: DC | PRN

## 2023-11-24 MED ORDER — TACROLIMUS 0.5 MG PO CAPS
0.5000 mg | ORAL_CAPSULE | Freq: Two times a day (BID) | ORAL | Status: DC
  Administered 2023-11-25 – 2023-11-29 (×10): 0.5 mg via ORAL
  Filled 2023-11-24 (×12): qty 1

## 2023-11-24 NOTE — Consult Note (Addendum)
 Hospital Consult    Reason for Consult:  pseudoaneurysm Requesting Service/Physician:  Suezanne Jacquet (ED)  MRN #:  [id number]  History of Present Illness: This is a 83 y.o. female with a history of a heart transplant who presented to the emergency department with groin swelling today.  She underwent a heart cath yesterday at [facility] and was discharged.  She denies having any intervention performed.  Since going home she has been having some chronic pain and swelling.  She denies numbness tingling, weakness in the thigh or lower leg.  She denies any pain in the foot.  She denies any lower leg swelling.  Past Medical History:  Diagnosis Date   A-fib (HCC)    resolved   Anxiety    Arrhythmia    bradycardia   CHF (congestive heart failure) (HCC)    resolved   Depression    DVT (deep venous thrombosis) (HCC)    GERD (gastroesophageal reflux disease)    Heart transplanted (HCC)    Followed at [facility] - Dr. Laurence Compton   HTN (hypertension)    Hyperparathyroidism [facility])    resolved   Osteoporosis    Pacemaker    resolved    Past Surgical History:  Procedure Laterality Date   ABDOMINAL HYSTERECTOMY     ABLATION  2007   ABLATION ON ENDOMETRIOSIS     APPENDECTOMY     BIOPSY  07/12/2023   Procedure: BIOPSY;  Surgeon: Kathi Der, MD;  Location: [facility] ENDOSCOPY;  Service: Gastroenterology;;   BOWEL DECOMPRESSION N/A 07/12/2023   Procedure: BOWEL DECOMPRESSION;  Surgeon: Kathi Der, MD;  Location: [facility] ENDOSCOPY;  Service: Gastroenterology;  Laterality: N/A;   BREAST EXCISIONAL BIOPSY Left 1986   benign   CARDIAC CATHETERIZATION  2009   COLONOSCOPY N/A 07/08/2023   Procedure: COLONOSCOPY;  Surgeon: Lynann Bologna, DO;  Location: [facility] ENDOSCOPY;  Service: Gastroenterology;  Laterality: N/A;   COLONOSCOPY WITH PROPOFOL N/A 07/12/2023   Procedure: COLONOSCOPY WITH PROPOFOL;  Surgeon: Kathi Der, MD;  Location: [facility] ENDOSCOPY;  Service: Gastroenterology;  Laterality: N/A;    HEART TRANSPLANT  2009   HEART TRANSPLANT     NECK SURGERY     removal of parathyroid   PACEMAKER INSERTION  2005   TOTAL ABDOMINAL HYSTERECTOMY W/ BILATERAL SALPINGOOPHORECTOMY      Allergies  Allergen Reactions   Antihistamines, Chlorpheniramine-Type Other (See Comments)    Numbness   Augmentin [Amoxicillin-Pot Clavulanate]     Nausea only   Cyclobenzaprine Other (See Comments)   Fentanyl Other (See Comments)    Other reaction(s): Confusion (intolerance)   Metronidazole     Unknown reaction    Valtrex [Valacyclovir]     "Made my chin numb"   Codeine Nausea Only    Prior to Admission medications   Medication Sig Start Date End Date Taking? Authorizing Provider  apixaban (ELIQUIS) 5 MG TABS tablet Take 5 mg by mouth 2 (two) times daily.    [provider]  aspirin 81 MG tablet Take 81 mg by mouth daily.    [provider]  calcium carbonate (TUMS - DOSED IN MG ELEMENTAL CALCIUM) 500 MG chewable tablet Chew 1 tablet (200 mg of elemental calcium total) by mouth 3 (three) times daily as needed for indigestion or heartburn. 08/18/21   Albertine Grates, MD  cyanocobalamin 500 MCG tablet Take 500 mcg by mouth daily.    [provider]  desvenlafaxine (PRISTIQ) 50 MG 24 hr tablet Take 50 mg by mouth daily.    [provider]  diltiazem (CARDIZEM CD) 120 MG 24 hr capsule Take 120 mg by mouth 2 (two) times daily. 07/06/16   [provider]  famotidine (PEPCID) 20 MG tablet Take 20 mg by mouth 2 (two) times daily. 11/07/19   [provider]  fluticasone furoate-vilanterol (BREO ELLIPTA) 100-25 MCG/INH AEPB Inhale 1 puff into the lungs daily.    [provider]  levothyroxine (SYNTHROID, LEVOTHROID) 50 MCG tablet Take 50 mcg by mouth daily before breakfast.    [provider]  losartan (COZAAR) 50 MG tablet Take 1 tablet (50 mg total) by mouth daily. 07/15/23   Glade Lloyd, MD  magnesium oxide (MAG-OX) 400 MG tablet Take 1  tablet (400 mg total) by mouth 2 (two) times daily. 07/14/23   Glade Lloyd, MD  Multiple Vitamins-Minerals (MULTIVITAMIN PO) Take 1 tablet by mouth daily.    [provider]  mycophenolate (CELLCEPT) 250 MG capsule Take 1 capsule by mouth 2 (two) times daily. 05/16/21   [provider]  ondansetron (ZOFRAN) 4 MG tablet Take 1 tablet (4 mg total) by mouth every 6 (six) hours as needed for nausea. 07/14/23   Glade Lloyd, MD  oxyCODONE (OXY IR/ROXICODONE) 5 MG immediate release tablet Take 1 tablet (5 mg total) by mouth every 6 (six) hours as needed for moderate pain (pain score 4-6). 07/14/23   Glade Lloyd, MD  polyethylene glycol (MIRALAX / GLYCOLAX) 17 g packet Take 17 g by mouth 2 (two) times daily. 07/14/23   Glade Lloyd, MD  Probiotic Product (ALIGN) CHEW Chew by mouth.    [provider]  promethazine (PHENERGAN) 12.5 MG tablet Take 1 tablet (12.5 mg total) by mouth every 6 (six) hours as needed for nausea or vomiting. 08/18/21   Albertine Grates, MD  senna-docusate (SENOKOT-S) 8.6-50 MG tablet Take 1 tablet by mouth at bedtime. 07/14/23   Glade Lloyd, MD  simethicone (GAS-X) 80 MG chewable tablet Chew 1 tablet (80 mg total) by mouth every 6 (six) hours as needed for flatulence. 12/08/20   Arthor Captain, PA-C  tacrolimus (PROGRAF) 0.5 MG capsule Take 0.5 mg by mouth 2 (two) times daily.    [provider]  Vitamin D, Ergocalciferol, (DRISDOL) 50000 UNITS CAPS capsule Take 50,000 Units by mouth every Friday.     [provider]    Social History   Socioeconomic History   Marital status: Married    Spouse name: Not on file   Number of children: 4   Years of education: [employer]   Highest education level: Not on file  Occupational History   Occupation: Retired   Tobacco Use   Smoking status: Former   Smokeless tobacco: Never   Tobacco comments:    Quit Agricultural engineer   Vaping status: Never Used  Substance and Sexual Activity   Alcohol  use: No    Alcohol/week: 0.0 standard drinks of alcohol   Drug use: No   Sexual activity: Not on file  Other Topics Concern   Not on file  Social History Narrative   3 caffeine drinks a day    Social Drivers of Corporate investment banker Strain: Not on file  Food Insecurity: No Food Insecurity (07/05/2023)   Hunger Vital Sign    Worried About Running Out of Food in the Last Year: Never true    Ran Out of Food in the Last Year: Never true  Transportation Needs: No Transportation Needs (07/05/2023)   PRAPARE - Transportation    Lack  of Transportation (Medical): No    Lack of Transportation (Non-Medical): No  Physical Activity: Not on file  Stress: Not on file  Social Connections: Not on file  Intimate Partner Violence: Not At Risk (07/05/2023)   Humiliation, Afraid, Rape, and Kick questionnaire    Fear of Current or Ex-Partner: No    Emotionally Abused: No    Physically Abused: No    Sexually Abused: No    Family History  Problem Relation Age of Onset   Heart attack Mother    Heart disease Mother    Colon cancer Father    Ulcers Father    Liver cancer Father    Colon polyps Sister    Heart attack Maternal Grandmother    Heart attack Paternal Grandmother    Diabetes type II Paternal Grandmother    OCD Daughter     ROS: Otherwise negative unless mentioned in HPI  Physical Examination  Vitals:   11/24/23 1448 11/24/23 1745  BP: (!) 150/65 (!) 173/66  Pulse: 83 85  Resp: 16   Temp: 98.3 F (36.8 C)   SpO2: 98% 99%   Body mass index is 30.11 kg/m.  General: no acute distress Cardiac: hemodynamically stable Pulm: normal work of breathing Neuro: alert, no focal deficit Extremities: Right groin with ecchymosis and swelling.  Tender to palpation.  No skin breakdown or erythema. Vascular:   Right: Palpable DP PT  Left: Palpable DP PT   Data:   +------------+----------+----------+------+----------+  Right DuplexPSV (cm/s) Waveform PlaqueComment(s)   +------------+----------+----------+------+----------+  Ext.Iliac     195     biphasic                   +------------+----------+----------+------+----------+  CFA           357    monophasic                  +------------+----------+----------+------+----------+  PFA            91    monophasic                  +------------+----------+----------+------+----------+  Prox SFA       106    monophasic                  +------------+----------+----------+------+----------+   Right Vein comments:The EIV, CFV and FV prox are patent with phasic flow.     Findings:   An area with well defined borders measuring 6.5 cm x 4.4 cm was visualized  arising off of the CFA with ultrasound characteristics of a partially  thrombosed pseudoaneurysm. The neck measures approximately 1.0 cm wide and  1.2 cm long.     ASSESSMENT/PLAN: This is a 83 y.o. female with a right femoral pseudoaneurysm after a diagnostic heart cath yesterday at [facility].  She does not have any compressive symptoms or signs of skin breakdown.  Will plan for open right femoral pseudoaneurysm repair in the OR tomorrow morning. Risks and benefits were reviewed, specifically we reviewed the risks of bleeding, infection and wound healing complications.  She expressed understanding and elected to proceed.  Will obtain CTA tonight to better evaluate Appreciate hospitalist admission Keep on bedrest overnight N.p.o. midnight Consent ordered   Daria Pastures MD [facility] [phone] 11/24/2023  6:31 PM

## 2023-11-24 NOTE — Progress Notes (Signed)
 Pseudoaneurysm evaluation completed.  Results can be found in chart review under CV Proc. And MD made aware.  11/24/2023 5:22 PM  [employer], RVT.

## 2023-11-24 NOTE — Plan of Care (Signed)

## 2023-11-24 NOTE — ED Notes (Signed)
 Patient transported to Ultrasound

## 2023-11-24 NOTE — H&P (Addendum)
 History and Physical    [name] [name] ZOX:[id number] DOB: 1940-12-16 DOA: 11/24/2023  PCP: [name] [name]., MD  Chief Complaint: Right-sided groin swelling and pain  HPI: [name] [name] is a 83 y.o. female with medical history significant of heart transplant in 2009, A-fib on Eliquis, history of DVT in 2018, hypothyroidism, hypertension, anxiety, depression, parathyroidectomy, GERD, osteoporosis, OSA not on CPAP, C. difficile colitis presenting with right-sided groin swelling and pain in the setting of heart cath yesterday at [facility].  Today she noticed that the skin in this area was turning purple.  She was having 8 out of 10 intensity pain before she was given pain medications in the ED.  Denies any numbness, tingling, or weakness of her leg.  She is on Eliquis.  No other complaints.  Denies fevers, chills, cough, shortness of breath, chest pain, nausea, vomiting, abdominal pain, or diarrhea.  ED Course: Vital signs on arrival: Temperature 98.3 F, pulse 83, respiratory rate 16, blood pressure 150/65, and SpO2 98% on room air.  Labs notable for WBC count 14.0, hemoglobin 11.6 (previously 12-13 on labs 4 months ago), MCV 90.6, bicarb 19 (slightly low on previous labs as well), glucose 107, creatinine 1.0.  Ultrasound showing right femoral artery pseudoaneurysm. Patient was given Dilaudid in the ED for pain.  Vascular surgery consulted and [facility] called to admit.  Review of Systems:  Review of Systems  All other systems reviewed and are negative.   Past Medical History:  Diagnosis Date   A-fib (HCC)    resolved   Anxiety    Arrhythmia    bradycardia   CHF (congestive heart failure) (HCC)    resolved   Depression    DVT (deep venous thrombosis) (HCC)    GERD (gastroesophageal reflux disease)    Heart transplanted (HCC)    Followed at [facility] - Dr. Laurence Compton   HTN (hypertension)    Hyperparathyroidism [facility])    resolved   Osteoporosis    Pacemaker    resolved     Past Surgical History:  Procedure Laterality Date   ABDOMINAL HYSTERECTOMY     ABLATION  2007   ABLATION ON ENDOMETRIOSIS     APPENDECTOMY     BIOPSY  07/12/2023   Procedure: BIOPSY;  Surgeon: Kathi Der, MD;  Location: [facility] ENDOSCOPY;  Service: Gastroenterology;;   BOWEL DECOMPRESSION N/A 07/12/2023   Procedure: BOWEL DECOMPRESSION;  Surgeon: Kathi Der, MD;  Location: [facility] ENDOSCOPY;  Service: Gastroenterology;  Laterality: N/A;   BREAST EXCISIONAL BIOPSY Left 1986   benign   CARDIAC CATHETERIZATION  2009   COLONOSCOPY N/A 07/08/2023   Procedure: COLONOSCOPY;  Surgeon: Lynann Bologna, DO;  Location: [facility] ENDOSCOPY;  Service: Gastroenterology;  Laterality: N/A;   COLONOSCOPY WITH PROPOFOL N/A 07/12/2023   Procedure: COLONOSCOPY WITH PROPOFOL;  Surgeon: Kathi Der, MD;  Location: [facility] ENDOSCOPY;  Service: Gastroenterology;  Laterality: N/A;   HEART TRANSPLANT  2009   HEART TRANSPLANT     NECK SURGERY     removal of parathyroid   PACEMAKER INSERTION  2005   TOTAL ABDOMINAL HYSTERECTOMY W/ BILATERAL SALPINGOOPHORECTOMY       reports that she has quit smoking. She has never used smokeless tobacco. She reports that she does not drink alcohol and does not use drugs.  Allergies  Allergen Reactions   Antihistamines, Chlorpheniramine-Type Other (See Comments)    Numbness   Augmentin [Amoxicillin-Pot Clavulanate]     Nausea only   Cyclobenzaprine Other (See Comments)   Fentanyl Other (  See Comments)    Other reaction(s): Confusion (intolerance)   Metronidazole     Unknown reaction    Valtrex [Valacyclovir]     "Made my chin numb"   Codeine Nausea Only    Family History  Problem Relation Age of Onset   Heart attack Mother    Heart disease Mother    Colon cancer Father    Ulcers Father    Liver cancer Father    Colon polyps Sister    Heart attack Maternal Grandmother    Heart attack Paternal Grandmother    Diabetes type II Paternal Grandmother    OCD  Daughter     Prior to Admission medications   Medication Sig Start Date End Date Taking? Authorizing Provider  apixaban (ELIQUIS) 5 MG TABS tablet Take 5 mg by mouth 2 (two) times daily.    [provider]  aspirin 81 MG tablet Take 81 mg by mouth daily.    [provider]  calcium carbonate (TUMS - DOSED IN MG ELEMENTAL CALCIUM) 500 MG chewable tablet Chew 1 tablet (200 mg of elemental calcium total) by mouth 3 (three) times daily as needed for indigestion or heartburn. 08/18/21   Albertine Grates, MD  cyanocobalamin 500 MCG tablet Take 500 mcg by mouth daily.    [provider]  desvenlafaxine (PRISTIQ) 50 MG 24 hr tablet Take 50 mg by mouth daily.    [provider]  diltiazem (CARDIZEM CD) 120 MG 24 hr capsule Take 120 mg by mouth 2 (two) times daily. 07/06/16   [provider]  famotidine (PEPCID) 20 MG tablet Take 20 mg by mouth 2 (two) times daily. 11/07/19   [provider]  fluticasone furoate-vilanterol (BREO ELLIPTA) 100-25 MCG/INH AEPB Inhale 1 puff into the lungs daily.    [provider]  levothyroxine (SYNTHROID, LEVOTHROID) 50 MCG tablet Take 50 mcg by mouth daily before breakfast.    [provider]  losartan (COZAAR) 50 MG tablet Take 1 tablet (50 mg total) by mouth daily. 07/15/23   Glade Lloyd, MD  magnesium oxide (MAG-OX) 400 MG tablet Take 1 tablet (400 mg total) by mouth 2 (two) times daily. 07/14/23   Glade Lloyd, MD  Multiple Vitamins-Minerals (MULTIVITAMIN PO) Take 1 tablet by mouth daily.    [provider]  mycophenolate (CELLCEPT) 250 MG capsule Take 1 capsule by mouth 2 (two) times daily. 05/16/21   [provider]  ondansetron (ZOFRAN) 4 MG tablet Take 1 tablet (4 mg total) by mouth every 6 (six) hours as needed for nausea. 07/14/23   Glade Lloyd, MD  oxyCODONE (OXY IR/ROXICODONE) 5 MG immediate release tablet Take 1 tablet (5 mg total) by mouth every 6 (six) hours as needed for  moderate pain (pain score 4-6). 07/14/23   Glade Lloyd, MD  polyethylene glycol (MIRALAX / GLYCOLAX) 17 g packet Take 17 g by mouth 2 (two) times daily. 07/14/23   Glade Lloyd, MD  Probiotic Product (ALIGN) CHEW Chew by mouth.    [provider]  promethazine (PHENERGAN) 12.5 MG tablet Take 1 tablet (12.5 mg total) by mouth every 6 (six) hours as needed for nausea or vomiting. 08/18/21   Albertine Grates, MD  senna-docusate (SENOKOT-S) 8.6-50 MG tablet Take 1 tablet by mouth at bedtime. 07/14/23   Glade Lloyd, MD  simethicone (GAS-X) 80 MG chewable tablet Chew 1 tablet (80 mg total) by mouth every 6 (six) hours as needed for flatulence. 12/08/20   Arthor Captain, PA-C  tacrolimus (PROGRAF) 0.5  MG capsule Take 0.5 mg by mouth 2 (two) times daily.    [provider]  Vitamin D, Ergocalciferol, (DRISDOL) 50000 UNITS CAPS capsule Take 50,000 Units by mouth every Friday.     [provider]    Physical Exam: Vitals:   11/24/23 1448 11/24/23 1450 11/24/23 1745 11/24/23 1900  BP: (!) 150/65  (!) 173/66 (!) 159/71  Pulse: 83  85 87  Resp: 16     Temp: 98.3 F (36.8 C)     TempSrc: Oral     SpO2: 98%  99% 99%  Weight:  77.1 kg    Height:  5\' 3"  (1.6 m)      Physical Exam Vitals reviewed.  Constitutional:      General: She is not in acute distress. HENT:     Head: Normocephalic and atraumatic.  Eyes:     Extraocular Movements: Extraocular movements intact.  Cardiovascular:     Rate and Rhythm: Normal rate and regular rhythm.     Pulses: Normal pulses.  Pulmonary:     Effort: Pulmonary effort is normal. No respiratory distress.     Breath sounds: Normal breath sounds. No wheezing or rales.  Abdominal:     General: Bowel sounds are normal. There is no distension.     Palpations: Abdomen is soft.     Tenderness: There is no abdominal tenderness. There is no guarding.  Musculoskeletal:     Cervical back: Normal range of motion.     Right lower leg: No edema.      Left lower leg: No edema.     Comments: Right groin with firm area of swelling and bruising.  No erythema or warmth. Dorsalis pedis pulse palpable and strong.  Foot warm to touch.  Skin:    General: Skin is warm and dry.  Neurological:     General: No focal deficit present.     Mental Status: She is alert and oriented to person, place, and time.     Labs on Admission: I have personally reviewed following labs and imaging studies  CBC: Recent Labs  Lab 11/24/23 1755  WBC 14.0*  NEUTROABS 10.3*  HGB 11.6*  HCT 34.5*  MCV 90.6  PLT 251   Basic Metabolic Panel: Recent Labs  Lab 11/24/23 1755  NA 136  K 3.7  CL 109  CO2 19*  GLUCOSE 107*  BUN 14  CREATININE 1.00  CALCIUM 8.5*   GFR: Estimated Creatinine Clearance: 41.2 mL/min (by C-G formula based on SCr of 1 mg/dL). Liver Function Tests: Recent Labs  Lab 11/24/23 1755  AST 20  ALT 15  ALKPHOS 73  BILITOT 0.7  PROT 5.9*  ALBUMIN 2.9*   No results for input(s): "LIPASE", "AMYLASE" in the last 168 hours. No results for input(s): "AMMONIA" in the last 168 hours. Coagulation Profile: No results for input(s): "INR", "PROTIME" in the last 168 hours. Cardiac Enzymes: No results for input(s): "CKTOTAL", "CKMB", "CKMBINDEX", "TROPONINI" in the last 168 hours. BNP (last 3 results) No results for input(s): "PROBNP" in the last 8760 hours. HbA1C: No results for input(s): "HGBA1C" in the last 72 hours. CBG: No results for input(s): "GLUCAP" in the last 168 hours. Lipid Profile: No results for input(s): "CHOL", "HDL", "LDLCALC", "TRIG", "CHOLHDL", "LDLDIRECT" in the last 72 hours. Thyroid Function Tests: No results for input(s): "TSH", "T4TOTAL", "FREET4", "T3FREE", "THYROIDAB" in the last 72 hours. Anemia Panel: No results for input(s): "VITAMINB12", "FOLATE", "FERRITIN", "TIBC", "IRON", "RETICCTPCT" in the last 72 hours. Urine analysis:  Component Value Date/Time   COLORURINE YELLOW 07/05/2023 1514    APPEARANCEUR CLEAR 07/05/2023 1514   LABSPEC 1.044 (H) 07/05/2023 1514   PHURINE 6.5 07/05/2023 1514   GLUCOSEU NEGATIVE 07/05/2023 1514   HGBUR NEGATIVE 07/05/2023 1514   BILIRUBINUR NEGATIVE 07/05/2023 1514   KETONESUR NEGATIVE 07/05/2023 1514   PROTEINUR TRACE (A) 07/05/2023 1514   UROBILINOGEN 0.2 01/07/2017 1655   NITRITE NEGATIVE 07/05/2023 1514   LEUKOCYTESUR NEGATIVE 07/05/2023 1514    Radiological Exams on Admission: VAS [place] GROIN PSEUDOANEURYSM Result Date: 11/24/2023  ARTERIAL PSEUDOANEURYSM  Patient Name:  DERIONA ALTEMOSE  Date of Exam:   11/24/2023 Medical Rec #: [id number]            Accession #:    [id number] Date of Birth: 04-26-1941           Patient Gender: F Patient Age:   1 years Exam Location:  [facility] Procedure:      VAS [place] Bobetta Lime Referring Phys: Alvino Blood --------------------------------------------------------------------------------  Exam: Right groin Indications: Patient complains of groin pain, bruising and palpable knot. History: S/p catheterization. Limitations: Localized edema. Comparison Study: No prior exam. Performing Technologist: Fernande Bras  Examination Guidelines: A complete evaluation includes B-mode imaging, spectral Doppler, color Doppler, and power Doppler as needed of all accessible portions of each vessel. Bilateral testing is considered an integral part of a complete examination. Limited examinations for reoccurring indications may be performed as noted. +------------+----------+----------+------+----------+ Right DuplexPSV (cm/s) Waveform PlaqueComment(s) +------------+----------+----------+------+----------+ Ext.Iliac      195     biphasic                  +------------+----------+----------+------+----------+ CFA            357    monophasic                 +------------+----------+----------+------+----------+ PFA             91    monophasic                  +------------+----------+----------+------+----------+ Prox SFA       106    monophasic                 +------------+----------+----------+------+----------+ Right Vein comments:The EIV, CFV and FV prox are patent with phasic flow.  Findings: An area with well defined borders measuring 6.5 cm x 4.4 cm was visualized arising off of the CFA with ultrasound characteristics of a partially thrombosed pseudoaneurysm. The neck measures approximately 1.0 cm wide and 1.2 cm long.   Suggest Peripheral Vascular Consult.    --------------------------------------------------------------------------------    Preliminary     EKG: Ordered and currently pending.  Assessment and Plan  Right femoral artery pseudoaneurysm In the setting of heart cath yesterday.  Vascular surgery has seen the patient and planning on open right femoral pseudoaneurysm repair tomorrow morning.  CTA ordered by vascular surgery for further evaluation.  Keep on bedrest overnight and keep n.p.o. after midnight.  Continue pain management.  Leukocytosis Possibly reactive.  No infectious signs or symptoms.  Continue to monitor CBC.  Normocytic anemia Hemoglobin 11.6 (previously 12-13 on labs 4 months ago).  Type and screen, continue to monitor H&H.  History of heart transplant in 2009 Mild nonobstructive CAD seen on heart cath yesterday.  Continue CellCept and Prograf after pharmacy med rec is done.  A-fib Hold Eliquis at this time.  Hypertension Pharmacy med rec pending.  IV labetalol PRN SBP >140.  Hypothyroidism Depression and anxiety GERD Pharmacy med rec pending.  DVT prophylaxis: SCDs Code Status: DNR-prearrest interventions desired (discussed with the patient) Family Communication: Daughter at bedside. Consults called: Vascular surgery Level of care: Telemetry bed Admission status: It is my clinical opinion that admission to INPATIENT is reasonable and necessary because of the expectation that this patient will  require hospital care that crosses at least 2 midnights to treat this condition based on the medical complexity of the problems presented.  Given the aforementioned information, the predictability of an adverse outcome is felt to be significant.   John Giovanni MD [facility]  If 7PM-7AM, please contact night-coverage [email or web address]  11/24/2023, 7:22 PM

## 2023-11-24 NOTE — ED Provider Notes (Signed)
 [place] EMERGENCY DEPARTMENT AT [facility] Provider Note  CSN: [id number] Arrival date & time: 11/24/23 1442  Chief Complaint(s) post procedure swelling  HPI Onelia Azrael Huss is a 83 y.o. female history of distant heart transplant, hypothyroidism, hypertension presenting to the emergency department with groin swelling.  Patient reports that she had a heart cath yesterday at [facility].  She was discharged, since going home, she has been having right groin pain and swelling.  Her daughter put some pressure on home without significant improvement.  [facility] called [facility] who advised hospital evaluation.  No numbness or tingling, weakness.  No chest pain or shortness of breath.  No other new symptoms.  She is on chronic Eliquis.   Past Medical History Past Medical History:  Diagnosis Date   A-fib (HCC)    resolved   Anxiety    Arrhythmia    bradycardia   CHF (congestive heart failure) (HCC)    resolved   Depression    DVT (deep venous thrombosis) (HCC)    GERD (gastroesophageal reflux disease)    Heart transplanted (HCC)    Followed at [facility] - Dr. Laurence Compton   HTN (hypertension)    Hyperparathyroidism [facility])    resolved   Osteoporosis    Pacemaker    resolved   Patient Active Problem List   Diagnosis Date Noted   Femoral artery pseudo-aneurysm, right (HCC) 11/24/2023   Large bowel obstruction (HCC) 07/05/2023   AKI (acute kidney injury) (HCC) 08/15/2021   Acute diverticulitis 08/15/2021   History of DVT (deep vein thrombosis) 08/15/2021   Hypothyroidism 08/15/2021   Sepsis (HCC) 08/14/2021   Colitis 07/18/2016   Depression    Heart transplanted (HCC)    HTN (hypertension)    Home Medication(s) Prior to Admission medications   Medication Sig Start Date End Date Taking? Authorizing Provider  apixaban (ELIQUIS) 5 MG TABS tablet Take 5 mg by mouth 2 (two) times daily.    [provider]  aspirin 81 MG tablet Take 81 mg by mouth daily.     [provider]  calcium carbonate (TUMS - DOSED IN MG ELEMENTAL CALCIUM) 500 MG chewable tablet Chew 1 tablet (200 mg of elemental calcium total) by mouth 3 (three) times daily as needed for indigestion or heartburn. 08/18/21   Albertine Grates, MD  cyanocobalamin 500 MCG tablet Take 500 mcg by mouth daily.    [provider]  desvenlafaxine (PRISTIQ) 50 MG 24 hr tablet Take 50 mg by mouth daily.    [provider]  diltiazem (CARDIZEM CD) 120 MG 24 hr capsule Take 120 mg by mouth 2 (two) times daily. 07/06/16   [provider]  famotidine (PEPCID) 20 MG tablet Take 20 mg by mouth 2 (two) times daily. 11/07/19   [provider]  fluticasone furoate-vilanterol (BREO ELLIPTA) 100-25 MCG/INH AEPB Inhale 1 puff into the lungs daily.    [provider]  levothyroxine (SYNTHROID, LEVOTHROID) 50 MCG tablet Take 50 mcg by mouth daily before breakfast.    [provider]  losartan (COZAAR) 50 MG tablet Take 1 tablet (50 mg total) by mouth daily. 07/15/23   Glade Lloyd, MD  magnesium oxide (MAG-OX) 400 MG tablet Take 1 tablet (400 mg total) by mouth 2 (two) times daily. 07/14/23   Glade Lloyd, MD  Multiple Vitamins-Minerals (MULTIVITAMIN PO) Take 1 tablet by mouth daily.    [provider]  mycophenolate (CELLCEPT) 250 MG capsule Take 1 capsule by mouth 2 (two) times daily.  05/16/21   [provider]  ondansetron (ZOFRAN) 4 MG tablet Take 1 tablet (4 mg total) by mouth every 6 (six) hours as needed for nausea. 07/14/23   Glade Lloyd, MD  oxyCODONE (OXY IR/ROXICODONE) 5 MG immediate release tablet Take 1 tablet (5 mg total) by mouth every 6 (six) hours as needed for moderate pain (pain score 4-6). 07/14/23   Glade Lloyd, MD  polyethylene glycol (MIRALAX / GLYCOLAX) 17 g packet Take 17 g by mouth 2 (two) times daily. 07/14/23   Glade Lloyd, MD  Probiotic Product (ALIGN) CHEW Chew by mouth.    [provider]   promethazine (PHENERGAN) 12.5 MG tablet Take 1 tablet (12.5 mg total) by mouth every 6 (six) hours as needed for nausea or vomiting. 08/18/21   Albertine Grates, MD  senna-docusate (SENOKOT-S) 8.6-50 MG tablet Take 1 tablet by mouth at bedtime. 07/14/23   Glade Lloyd, MD  simethicone (GAS-X) 80 MG chewable tablet Chew 1 tablet (80 mg total) by mouth every 6 (six) hours as needed for flatulence. 12/08/20   Arthor Captain, PA-C  tacrolimus (PROGRAF) 0.5 MG capsule Take 0.5 mg by mouth 2 (two) times daily.    [provider]  Vitamin D, Ergocalciferol, (DRISDOL) 50000 UNITS CAPS capsule Take 50,000 Units by mouth every Friday.     [provider]                                                                                                                                    Past Surgical History Past Surgical History:  Procedure Laterality Date   ABDOMINAL HYSTERECTOMY     ABLATION  2007   ABLATION ON ENDOMETRIOSIS     APPENDECTOMY     BIOPSY  07/12/2023   Procedure: BIOPSY;  Surgeon: Kathi Der, MD;  Location: [facility] ENDOSCOPY;  Service: Gastroenterology;;   BOWEL DECOMPRESSION N/A 07/12/2023   Procedure: BOWEL DECOMPRESSION;  Surgeon: Kathi Der, MD;  Location: [facility] ENDOSCOPY;  Service: Gastroenterology;  Laterality: N/A;   BREAST EXCISIONAL BIOPSY Left 1986   benign   CARDIAC CATHETERIZATION  2009   COLONOSCOPY N/A 07/08/2023   Procedure: COLONOSCOPY;  Surgeon: Lynann Bologna, DO;  Location: [facility] ENDOSCOPY;  Service: Gastroenterology;  Laterality: N/A;   COLONOSCOPY WITH PROPOFOL N/A 07/12/2023   Procedure: COLONOSCOPY WITH PROPOFOL;  Surgeon: Kathi Der, MD;  Location: [facility] ENDOSCOPY;  Service: Gastroenterology;  Laterality: N/A;   HEART TRANSPLANT  2009   HEART TRANSPLANT     NECK SURGERY     removal of parathyroid   PACEMAKER INSERTION  2005   TOTAL ABDOMINAL HYSTERECTOMY W/ BILATERAL SALPINGOOPHORECTOMY     Family History Family History  Problem  Relation Age of Onset   Heart attack Mother    Heart disease Mother    Colon cancer Father    Ulcers Father    Liver cancer Father    Colon polyps Sister    Heart  attack Maternal Grandmother    Heart attack Paternal Grandmother    Diabetes type II Paternal Grandmother    OCD Daughter     Social History Social History   Tobacco Use   Smoking status: Former   Smokeless tobacco: Never   Tobacco comments:    Quit 1960  Vaping Use   Vaping status: Never Used  Substance Use Topics   Alcohol use: No    Alcohol/week: 0.0 standard drinks of alcohol   Drug use: No   Allergies Antihistamines, chlorpheniramine-type; Augmentin [amoxicillin-pot clavulanate]; Cyclobenzaprine; Fentanyl; Metronidazole; Valtrex [valacyclovir]; and Codeine  Review of Systems Review of Systems  All other systems reviewed and are negative.   Physical Exam Vital Signs  I have reviewed the triage vital signs BP (!) 159/71   Pulse 87   Temp 98.3 F (36.8 C) (Oral) Comment: Simultaneous filing. User may not have seen previous data. Comment (Src): Simultaneous filing. User may not have seen previous data.  Resp 16 Comment: Simultaneous filing. User may not have seen previous data.  Ht 5\' 3"  (1.6 m)   Wt 77.1 kg   SpO2 99%   BMI 30.11 kg/m  Physical Exam Vitals and nursing note reviewed.  Constitutional:      General: She is not in acute distress.    Appearance: She is well-developed.  HENT:     Head: Normocephalic and atraumatic.     Mouth/Throat:     Mouth: Mucous membranes are moist.  Eyes:     Pupils: Pupils are equal, round, and reactive to light.  Cardiovascular:     Rate and Rhythm: Normal rate and regular rhythm.     Heart sounds: No murmur heard. Pulmonary:     Effort: Pulmonary effort is normal. No respiratory distress.     Breath sounds: Normal breath sounds.  Abdominal:     General: Abdomen is flat.     Palpations: Abdomen is soft.     Tenderness: There is no abdominal  tenderness.  Musculoskeletal:        General: No tenderness.     Right lower leg: No edema.     Left lower leg: No edema.     Comments: Right groin with firm area of swelling, bruising, tenderness.  No erythema or warmth.  Chaperoned by RN.  Distal DP pulse intact bilaterally  Skin:    General: Skin is warm and dry.  Neurological:     General: No focal deficit present.     Mental Status: She is alert. Mental status is at baseline.  Psychiatric:        Mood and Affect: Mood normal.        Behavior: Behavior normal.     ED Results and Treatments Labs (all labs ordered are listed, but only abnormal results are displayed) Labs Reviewed  COMPREHENSIVE METABOLIC PANEL WITH GFR - Abnormal; Notable for the following components:      Result Value   CO2 19 (*)    Glucose, Bld 107 (*)    Calcium 8.5 (*)    Total Protein 5.9 (*)    Albumin 2.9 (*)    GFR, Estimated 56 (*)    All other components within normal limits  CBC WITH DIFFERENTIAL/PLATELET - Abnormal; Notable for the following components:   WBC 14.0 (*)    RBC 3.81 (*)    Hemoglobin 11.6 (*)    HCT 34.5 (*)    Neutro Abs 10.3 (*)    Monocytes Absolute 1.4 (*)  All other components within normal limits  TYPE AND SCREEN                                                                                                                          Radiology VAS [place] GROIN PSEUDOANEURYSM Result Date: 11/24/2023  [facility]TER[facility] PSEUDOANEURYSM  Patient Name:  ELISABETH STROM  Date of Exam:   11/24/2023 Medical Rec #: [id number]            Accession #:    [id number] Date of Birth: 01/23/1941           Patient Gender: F Patient Age:   40 years Exam Location:  [facility] Procedure:      VAS [place] Bobetta Lime Referring Phys: Alvino Blood --------------------------------------------------------------------------------  Exam: Right groin Indications: Patient complains of groin pain, bruising and palpable knot. History: S/p  catheterization. Limitations: Localized edema. Comparison Study: No prior exam. Performing Technologist: Fernande Bras  Examination Guidelines: A complete evaluation includes B-mode imaging, spectral Doppler, color Doppler, and power Doppler as needed of all accessible portions of each vessel. Bilateral testing is considered an integral part of a complete examination. Limited examinations for reoccurring indications may be performed as noted. +------------+----------+----------+------+----------+ Right DuplexPSV (cm/s) Waveform PlaqueComment(s) +------------+----------+----------+------+----------+ Ext.Iliac      195     biphasic                  +------------+----------+----------+------+----------+ CFA            357    monophasic                 +------------+----------+----------+------+----------+ PFA             91    monophasic                 +------------+----------+----------+------+----------+ Prox SFA       106    monophasic                 +------------+----------+----------+------+----------+ Right Vein comments:The EIV, CFV and FV prox are patent with phasic flow.  Findings: An area with well defined borders measuring 6.5 cm x 4.4 cm was visualized arising off of the CFA with ultrasound characteristics of a partially thrombosed pseudoaneurysm. The neck measures approximately 1.0 cm wide and 1.2 cm long.   Suggest Peripheral Vascular Consult.    --------------------------------------------------------------------------------    Preliminary     Pertinent labs & imaging results that were available during my care of the patient were reviewed by me and considered in my medical decision making (see MDM for details).  Medications Ordered in ED Medications  HYDROmorphone (DILAUDID) injection 0.5 mg (0.5 mg Intravenous Given 11/24/23 1544)  HYDROmorphone (DILAUDID) injection 0.5 mg (0.5 mg Intravenous Given 11/24/23 1754)  Procedures Procedures  (including critical care time)  Medical Decision Making / ED Course   MDM:  84 year old presenting to the emergency department with groin swelling after left heart cath.  Patient does have firm area on exam.  History concerning for possible pseudoaneurysm, bleeding, other complication of femoral access.  Her distal pulses are intact which is reassuring.  She is on anticoagulation.  Will obtain ultrasound and laboratory testing and reassess.  Clinical Course as of 11/24/23 1922  Sat Nov 24, 2023  1806 Ultrasound shows right femoral pseudoaneurysm.  Labs pending.  Patient had procedure done at [facility] but she does not want to go back to [facility].  Discussed with Dr. Hetty Blend with vascular surgery, he will consult, based off of size of pseudoaneurysm will probably need surgical closure.  He will plan to do this tomorrow.  Will consult hospitalist following laboratory results [WS]  1921 Patient admitted by Dr. Loney Loh [WS]    Clinical Course User Index [WS] Suezanne Jacquet Jerilee Field, MD     Additional history obtained: -Additional history obtained from ems -External records from outside source obtained and reviewed including: Chart review including previous notes, labs, imaging, consultation notes including prior [facility] notes    Lab Tests: -I ordered, reviewed, and interpreted labs.   The pertinent results include:   Labs Reviewed  COMPREHENSIVE METABOLIC PANEL WITH GFR - Abnormal; Notable for the following components:      Result Value   CO2 19 (*)    Glucose, Bld 107 (*)    Calcium 8.5 (*)    Total Protein 5.9 (*)    Albumin 2.9 (*)    GFR, Estimated 56 (*)    All other components within normal limits  CBC WITH DIFFERENTIAL/PLATELET - Abnormal; Notable for the following components:   WBC 14.0 (*)    RBC 3.81 (*)    Hemoglobin 11.6 (*)    HCT 34.5 (*)     Neutro Abs 10.3 (*)    Monocytes Absolute 1.4 (*)    All other components within normal limits  TYPE AND SCREEN    Notable for leukocytosis, likely reactive      Imaging Studies ordered: I ordered imaging studies including [place] pseudoaneurism  On my interpretation imaging demonstrates positive findings  I independently visualized and interpreted imaging. I agree with the radiologist interpretation   Medicines ordered and prescription drug management: Meds ordered this encounter  Medications   HYDROmorphone (DILAUDID) injection 0.5 mg   HYDROmorphone (DILAUDID) injection 0.5 mg    -I have reviewed the patients home medicines and have made adjustments as needed   Consultations Obtained: I requested consultation with the vascular surgeon Dr. Hetty Blend,  and discussed lab and imaging findings as well as pertinent plan - they recommend: admit    Cardiac Monitoring: The patient was maintained on a cardiac monitor.  I personally viewed and interpreted the cardiac monitored which showed an underlying rhythm of: NSR  Reevaluation: After the interventions noted above, I reevaluated the patient and found that their symptoms have improved  Co morbidities that complicate the patient evaluation  Past Medical History:  Diagnosis Date   A-fib (HCC)    resolved   Anxiety    Arrhythmia    bradycardia   CHF (congestive heart failure) (HCC)    resolved   Depression    DVT (deep venous thrombosis) (HCC)    GERD (gastroesophageal reflux disease)    Heart transplanted [facility])    Followed at [facility] - Dr. Laurence Compton  HTN (hypertension)    Hyperparathyroidism (HCC)    resolved   Osteoporosis    Pacemaker    resolved      Dispostion: Disposition decision including need for hospitalization was considered, and patient admitted to the hospital.    Final Clinical Impression(s) / ED Diagnoses Final diagnoses:  Pseudoaneurysm following procedure [facility])     This chart was dictated  using voice recognition software.  Despite best efforts to proofread,  errors can occur which can change the documentation meaning.    Lonell Grandchild, MD 11/24/23 Ernestina Columbia

## 2023-11-24 NOTE — ED Triage Notes (Signed)
 Pt to ED via [facility] from [facility].  Pt had right heart cath yesterday and right groin site is swelling and purple today. Pt c/o pain at site that started today. Bandage in place on arrival.   128/54 98% RA 84

## 2023-11-25 ENCOUNTER — Other Ambulatory Visit: Payer: Self-pay

## 2023-11-25 ENCOUNTER — Encounter (HOSPITAL_COMMUNITY): Payer: Self-pay | Admitting: Internal Medicine

## 2023-11-25 ENCOUNTER — Encounter (HOSPITAL_COMMUNITY)
Admission: EM | Disposition: A | Discharge status: Home Health | Payer: Self-pay | Source: Skilled Nursing Facility | Attending: Internal Medicine

## 2023-11-25 ENCOUNTER — Inpatient Hospital Stay (HOSPITAL_COMMUNITY): Admitting: Anesthesiology

## 2023-11-25 DIAGNOSIS — T81718A Complication of other artery following a procedure, not elsewhere classified, initial encounter: Secondary | ICD-10-CM | POA: Diagnosis not present

## 2023-11-25 DIAGNOSIS — I724 Aneurysm of artery of lower extremity: Secondary | ICD-10-CM | POA: Diagnosis not present

## 2023-11-25 DIAGNOSIS — Z87891 Personal history of nicotine dependence: Secondary | ICD-10-CM

## 2023-11-25 DIAGNOSIS — I10 Essential (primary) hypertension: Secondary | ICD-10-CM

## 2023-11-25 DIAGNOSIS — I729 Aneurysm of unspecified site: Secondary | ICD-10-CM

## 2023-11-25 DIAGNOSIS — Z941 Heart transplant status: Secondary | ICD-10-CM | POA: Diagnosis not present

## 2023-11-25 DIAGNOSIS — E039 Hypothyroidism, unspecified: Secondary | ICD-10-CM | POA: Diagnosis not present

## 2023-11-25 HISTORY — PX: FALSE ANEURYSM REPAIR: SHX5152

## 2023-11-25 LAB — BASIC METABOLIC PANEL WITH GFR
Anion gap: 6 (ref 5–15)
BUN: 12 mg/dL (ref 8–23)
CO2: 22 mmol/L (ref 22–32)
Calcium: 8.6 mg/dL — ABNORMAL LOW (ref 8.9–10.3)
Chloride: 108 mmol/L (ref 98–111)
Creatinine, Ser: 1 mg/dL (ref 0.44–1.00)
GFR, Estimated: 56 mL/min — ABNORMAL LOW (ref >60)
Glucose, Bld: 127 mg/dL — ABNORMAL HIGH (ref 70–99)
Potassium: 4 mmol/L (ref 3.5–5.1)
Sodium: 136 mmol/L (ref 135–145)

## 2023-11-25 LAB — CBC
HCT: 33.8 % — ABNORMAL LOW (ref 36.0–46.0)
Hemoglobin: 11.6 g/dL — ABNORMAL LOW (ref 12.0–15.0)
MCH: 30.3 pg (ref 26.0–34.0)
MCHC: 34.3 g/dL (ref 30.0–36.0)
MCV: 88.3 fL (ref 80.0–100.0)
Platelets: 263 10*3/uL (ref 150–400)
RBC: 3.83 MIL/uL — ABNORMAL LOW (ref 3.87–5.11)
RDW: 13.2 % (ref 11.5–15.5)
WBC: 14 10*3/uL — ABNORMAL HIGH (ref 4.0–10.5)
nRBC: 0 % (ref 0.0–0.2)

## 2023-11-25 LAB — SURGICAL PCR SCREEN
MRSA, PCR: NEGATIVE
Staphylococcus aureus: POSITIVE — AB

## 2023-11-25 LAB — MAGNESIUM: Magnesium: 1.8 mg/dL (ref 1.7–2.4)

## 2023-11-25 SURGERY — REPAIR, PSEUDOANEURYSM
Anesthesia: General | Site: Groin | Laterality: Right

## 2023-11-25 MED ORDER — ALBUMIN HUMAN 5 % IV SOLN: INTRAVENOUS | Status: DC | PRN

## 2023-11-25 MED ORDER — SUGAMMADEX SODIUM 200 MG/2ML IV SOLN
INTRAVENOUS | Status: DC | PRN
  Administered 2023-11-25: 200 mg via INTRAVENOUS

## 2023-11-25 MED ORDER — ALUM & MAG HYDROXIDE-SIMETH 200-200-20 MG/5ML PO SUSP
30.0000 mL | Freq: Four times a day (QID) | ORAL | Status: DC | PRN
  Administered 2023-11-25: 30 mL via ORAL
  Filled 2023-11-25: qty 30

## 2023-11-25 MED ORDER — FENTANYL CITRATE (PF) 250 MCG/5ML IJ SOLN
INTRAMUSCULAR | Status: AC
  Filled 2023-11-25: qty 5

## 2023-11-25 MED ORDER — DEXAMETHASONE SODIUM PHOSPHATE 10 MG/ML IJ SOLN
INTRAMUSCULAR | Status: DC | PRN
  Administered 2023-11-25: 10 mg via INTRAVENOUS

## 2023-11-25 MED ORDER — HEPARIN 6000 UNIT IRRIGATION SOLUTION
Status: DC | PRN
  Administered 2023-11-25: 1

## 2023-11-25 MED ORDER — 0.9 % SODIUM CHLORIDE (POUR BTL) OPTIME
TOPICAL | Status: DC | PRN
  Administered 2023-11-25: 1000 mL

## 2023-11-25 MED ORDER — ROCURONIUM BROMIDE 10 MG/ML (PF) SYRINGE
PREFILLED_SYRINGE | INTRAVENOUS | Status: DC | PRN
  Administered 2023-11-25: 50 mg via INTRAVENOUS

## 2023-11-25 MED ORDER — FENTANYL CITRATE (PF) 100 MCG/2ML IJ SOLN
INTRAMUSCULAR | Status: AC
  Filled 2023-11-25: qty 2

## 2023-11-25 MED ORDER — HEPARIN SODIUM (PORCINE) 1000 UNIT/ML IJ SOLN
INTRAMUSCULAR | Status: DC | PRN
Start: 2023-11-25 — End: 2023-11-25
  Administered 2023-11-25: 7000 [IU] via INTRAVENOUS

## 2023-11-25 MED ORDER — ORAL CARE MOUTH RINSE: 15.0000 mL | Freq: Once | OROMUCOSAL | Status: AC

## 2023-11-25 MED ORDER — CHLORHEXIDINE GLUCONATE 0.12 % MT SOLN: 15.0000 mL | Freq: Once | OROMUCOSAL | Status: AC

## 2023-11-25 MED ORDER — CHLORHEXIDINE GLUCONATE 0.12 % MT SOLN
OROMUCOSAL | Status: AC
  Administered 2023-11-25: 15 mL via OROMUCOSAL
  Filled 2023-11-25: qty 15

## 2023-11-25 MED ORDER — PROPOFOL 10 MG/ML IV BOLUS
INTRAVENOUS | Status: AC
  Filled 2023-11-25: qty 20

## 2023-11-25 MED ORDER — ONDANSETRON HCL 4 MG/2ML IJ SOLN: 4.0000 mg | Freq: Once | INTRAMUSCULAR | Status: DC | PRN

## 2023-11-25 MED ORDER — PROPOFOL 10 MG/ML IV BOLUS
INTRAVENOUS | Status: DC | PRN
Start: 2023-11-25 — End: 2023-11-25
  Administered 2023-11-25: 50 mg via INTRAVENOUS
  Administered 2023-11-25 (×2): 100 mg via INTRAVENOUS

## 2023-11-25 MED ORDER — OXYCODONE HCL 5 MG/5ML PO SOLN: 5.0000 mg | Freq: Once | ORAL | Status: DC | PRN

## 2023-11-25 MED ORDER — ACETAMINOPHEN 500 MG PO TABS: 1000.0000 mg | ORAL_TABLET | Freq: Once | ORAL | Status: DC

## 2023-11-25 MED ORDER — LACTATED RINGERS IV SOLN: INTRAVENOUS | Status: DC

## 2023-11-25 MED ORDER — ACETAMINOPHEN 500 MG PO TABS
ORAL_TABLET | ORAL | Status: AC
Start: 2023-11-25 — End: 2023-11-25
  Filled 2023-11-25: qty 2

## 2023-11-25 MED ORDER — PHENYLEPHRINE 80 MCG/ML (10ML) SYRINGE FOR IV PUSH (FOR BLOOD PRESSURE SUPPORT)
PREFILLED_SYRINGE | INTRAVENOUS | Status: DC | PRN
  Administered 2023-11-25 (×3): 80 ug via INTRAVENOUS

## 2023-11-25 MED ORDER — CEFAZOLIN SODIUM-DEXTROSE 1-4 GM/50ML-% IV SOLN
INTRAVENOUS | Status: DC | PRN
  Administered 2023-11-25: 2 g via INTRAVENOUS

## 2023-11-25 MED ORDER — ONDANSETRON HCL 4 MG/2ML IJ SOLN
INTRAMUSCULAR | Status: DC | PRN
  Administered 2023-11-25: 4 mg via INTRAVENOUS

## 2023-11-25 MED ORDER — HEPARIN SODIUM (PORCINE) 1000 UNIT/ML IJ SOLN
INTRAMUSCULAR | Status: AC
  Filled 2023-11-25: qty 10

## 2023-11-25 MED ORDER — OXYCODONE HCL 5 MG PO TABS: 5.0000 mg | ORAL_TABLET | Freq: Once | ORAL | Status: DC | PRN

## 2023-11-25 MED ORDER — FENTANYL CITRATE (PF) 100 MCG/2ML IJ SOLN
25.0000 ug | INTRAMUSCULAR | Status: DC | PRN
Start: 2023-11-25 — End: 2023-11-25
  Administered 2023-11-25: 50 ug via INTRAVENOUS

## 2023-11-25 MED ORDER — LIDOCAINE 2% (20 MG/ML) 5 ML SYRINGE
INTRAMUSCULAR | Status: DC | PRN
  Administered 2023-11-25: 50 mg via INTRAVENOUS

## 2023-11-25 MED ORDER — PROTAMINE SULFATE 10 MG/ML IV SOLN
INTRAVENOUS | Status: DC | PRN
  Administered 2023-11-25: 30 mg via INTRAVENOUS

## 2023-11-25 MED ORDER — PHENYLEPHRINE HCL-NACL 20-0.9 MG/250ML-% IV SOLN
INTRAVENOUS | Status: DC | PRN
Start: 2023-11-25 — End: 2023-11-25
  Administered 2023-11-25: 50 ug/min via INTRAVENOUS

## 2023-11-25 MED ORDER — FENTANYL CITRATE (PF) 250 MCG/5ML IJ SOLN
INTRAMUSCULAR | Status: DC | PRN
  Administered 2023-11-25: 50 ug via INTRAVENOUS
  Administered 2023-11-25: 100 ug via INTRAVENOUS

## 2023-11-25 MED ORDER — HEMOSTATIC AGENTS (NO CHARGE) OPTIME
TOPICAL | Status: DC | PRN
  Administered 2023-11-25: 1 via TOPICAL

## 2023-11-25 SURGICAL SUPPLY — 34 items
BAG COUNTER SPONGE SURGICOUNT (BAG) ×1 IMPLANT
CANISTER SUCT 3000ML PPV (MISCELLANEOUS) ×1 IMPLANT
CATH EMB 3FR 80 (CATHETERS) IMPLANT
CATH EMB 4FR 80 (CATHETERS) IMPLANT
CATH EMB 5FR 80CM (CATHETERS) IMPLANT
CLIP TI MEDIUM 6 (CLIP) ×1 IMPLANT
CLIP TI WIDE RED SMALL 6 (CLIP) ×1 IMPLANT
DERMABOND ADVANCED .7 DNX12 (GAUZE/BANDAGES/DRESSINGS) ×1 IMPLANT
DRAIN CHANNEL 15F RND FF W/TCR (WOUND CARE) IMPLANT
ELECT BLADE 6.5 EXT (BLADE) ×1 IMPLANT
GLOVE BIOGEL PI IND STRL 7.0 (GLOVE) ×2 IMPLANT
GOWN STRL REUS W/ TWL LRG LVL3 (GOWN DISPOSABLE) ×2 IMPLANT
GOWN STRL REUS W/ TWL XL LVL3 (GOWN DISPOSABLE) ×1 IMPLANT
HEMOSTAT HEMOBLAST BELLOWS (HEMOSTASIS) IMPLANT
HEMOSTAT SNOW SURGICEL 2X4 (HEMOSTASIS) IMPLANT
KIT BASIN OR (CUSTOM PROCEDURE TRAY) ×1 IMPLANT
KIT TURNOVER KIT B (KITS) ×2 IMPLANT
NS IRRIG 1000ML POUR BTL (IV SOLUTION) ×4 IMPLANT
PACK PERIPHERAL VASCULAR (CUSTOM PROCEDURE TRAY) ×1 IMPLANT
PAD ARMBOARD POSITIONER FOAM (MISCELLANEOUS) ×2 IMPLANT
PENCIL BUTTON HOLSTER BLD 10FT (ELECTRODE) ×1 IMPLANT
SPONGE T-LAP 18X18 ~~LOC~~+RFID (SPONGE) IMPLANT
SUT MNCRL AB 4-0 PS2 18 (SUTURE) IMPLANT
SUT PROLENE 5 0 C 1 24 (SUTURE) ×1 IMPLANT
SUT PROLENE 6 0 BV (SUTURE) ×1 IMPLANT
SUT PROLENE 6 0 C 1 24 (SUTURE) IMPLANT
SUT PROLENE 7 0 BV 1 (SUTURE) IMPLANT
SUT SILK 2 0 SH (SUTURE) ×1 IMPLANT
SUT VIC AB 2-0 CT1 TAPERPNT 27 (SUTURE) ×2 IMPLANT
SUT VIC AB 3-0 SH 27X BRD (SUTURE) ×2 IMPLANT
SYR 3ML LL SCALE MARK (SYRINGE) IMPLANT
TOWEL GREEN STERILE (TOWEL DISPOSABLE) ×1 IMPLANT
UNDERPAD 30X36 HEAVY ABSORB (UNDERPADS AND DIAPERS) ×1 IMPLANT
WATER STERILE IRR 1000ML POUR (IV SOLUTION) ×2 IMPLANT

## 2023-11-25 NOTE — Op Note (Signed)
    OPERATIVE NOTE  PROCEDURE:   Right femoral artery pseudoaneurysm repair  PRE-OPERATIVE DIAGNOSIS: Right femoral artery pseudoaneurysm  POST-OPERATIVE DIAGNOSIS: same as above   SURGEON: Daria Pastures MD  ASSISTANT(S): Lianne Cure, PA  Given the complexity of the case,  the assistant was necessary in order to expedient the procedure and safely perform the technical aspects of the operation.  The assistant provided traction and countertraction to assist with exposure of the artery proximally and distally.  They assisted with suture ligature of multiple branches.  Their assistance was critical in the performance of the repair These skills could not have been adequately performed by a scrub tech assistant.   ANESTHESIA: general  ESTIMATED BLOOD LOSS: 750 cc  FINDING(S): Approximate 3 mm defect in the lateral sidewall of the right common femoral artery  Multiphasic Doppler signal in right common femoral artery distal to injury after repair  SPECIMEN(S): None  INDICATIONS:   [name] [name] is a 83 y.o. female with with a heart transplant who underwent heart cath on Friday for normal surveillance.  She had ongoing swelling and bruising throughout the weekend and presented to the [facility], ED where she was diagnosed with a right femoral pseudoaneurysm.  A CTA was obtained which demonstrated a broad-based bilobed pseudoaneurysm without a measurable neck on the anterior wall of the right common femoral artery.  Risks and benefits of open pseudoaneurysm repair were reviewed, she expressed understanding and elected to proceed.  DESCRIPTION: The patient was brought to the operating room and positioned supine on the operating table.  General anesthesia was induced and the patient was prepped and draped in the usual sterile fashion.  A timeout was performed and preoperative antibiotics were administered. A transverse incision was made overlying proximal to the right groin crease.   The inguinal ligament was then identified with electrocautery and blunt dissection.  The inguinal ligament was then mobilized cephalad and dissection was carried deeper with electrocautery.  The anterior surface of the proximal right common femoral artery was identified.  This was isolated circumferentially and controlled with a Silastic vessel loop.  The patient will systemically heparinized and a vascular clamp was placed in the proximal common femoral artery.  Dissection was then carried further distally down the artery.  There was a dense rind of pseudoaneurysm wall which made distal dissection difficult leading to some extra blood loss.  The distal aspect of the right common femoral artery was finally identified and controlled with manual pressure.  The defect was then noticed to be on the lateral sidewall of the artery and this was repaired with two interrupted sutures of 5-0 prolene. The proximal clamp was released and flow restored. Doppler examination demonstrated excellent multiphasic signal in the common femoral artery distal to the repair.  The wound was then copiously irrigated and hemostasis was achieved with vascular clips and electrocautery.  Hemostatic agent was then applied in the wound bed.  "The wound was then closed in layers of 2-0 Vicryl, 3-0 Vicryl and 4-0 Monocryl for the skin.  Dermabond was applied. The patient tolerated the procedure well WERE correct at the end of the procedure and she was brought to recovery in stable condition.    COMPLICATIONS: None apparent  CONDITION: Stable  Daria Pastures MD [facility] Phone Number: [phone] 11/25/2023 12:32 PM

## 2023-11-25 NOTE — Progress Notes (Signed)
  Daily Progress Note  Subjective: No complaints today  Objective: Vitals:   11/25/23 0757 11/25/23 0915  BP: (!) 155/64 (!) 149/63  Pulse: 85 85  Resp: 19 17  Temp: 98.1 F (36.7 C) 98.4 F (36.9 C)  SpO2: 95% 92%    Physical Examination NAD HDS Right groin swelling stable.  ASSESSMENT/PLAN:   This is a 83 y.o. female with a right femoral pseudoaneurysm after a diagnostic heart cath on Friday  Plan for open R femoral pseudo repair.     [name] Pastures MD MS [facility] [phone] 11/25/2023  9:44 AM

## 2023-11-25 NOTE — Anesthesia Postprocedure Evaluation (Signed)
 Anesthesia Post Note  Patient: [name] [name]  Procedure(s) Performed: REPAIR, PSEUDOANEURYSM RIGHT FEMORAL (Right: Groin)     Patient location during evaluation: PACU Anesthesia Type: General Level of consciousness: awake and alert Pain management: pain level controlled Vital Signs Assessment: post-procedure vital signs reviewed and stable Respiratory status: spontaneous breathing, nonlabored ventilation, respiratory function stable and patient connected to nasal cannula oxygen Cardiovascular status: blood pressure returned to baseline, stable and tachycardic Postop Assessment: no apparent nausea or vomiting Anesthetic complications: no  There were no known notable events for this encounter.  Last Vitals:  Vitals:   11/25/23 1315 11/25/23 1344  BP: (!) 134/48 117/61  Pulse: (!) 106 (!) 106  Resp: 12 17  Temp: 37.3 C   SpO2: 93% 95%                  [name]

## 2023-11-25 NOTE — Anesthesia Procedure Notes (Signed)
 Procedure Name: Intubation Date/Time: 11/25/2023 11:10 AM  Performed by: Gloris Ham, CRNAPre-anesthesia Checklist: Patient identified, Emergency Drugs available, Suction available and Patient being monitored Patient Re-evaluated:Patient Re-evaluated prior to induction Oxygen Delivery Method: Circle System Utilized Preoxygenation: Pre-oxygenation with 100% oxygen Induction Type: IV induction Ventilation: Mask ventilation without difficulty Laryngoscope Size: Mac and 3 Grade View: Grade III Tube type: Oral Tube size: 7.0 mm Number of attempts: 1 Airway Equipment and Method: Stylet and Oral airway Placement Confirmation: ETT inserted through vocal cords under direct vision, positive ETCO2 and breath sounds checked- equal and bilateral Tube secured with: Tape Dental Injury: Teeth and Oropharynx as per pre-operative assessment

## 2023-11-25 NOTE — Progress Notes (Addendum)
 [facility]                                                                              [name] [name], is a 83 y.o. female, DOB - October 06, 1940, UEA:[id number] Admit date - 11/24/2023    Outpatient Primary MD for the patient is Clelia Croft Netta Corrigan., MD  LOS - 1  days  Chief Complaint  Patient presents with   post procedure swelling       Brief summary   Patient is a 83 year old female with a history of heart transplant in 2009, DVT in 2018 on Eliquis , hypothyroidism, HTN, anxiety, depression, parathyroidectomy, GERD, osteoporosis, OSA on not on CPAP, presented with right groin swelling and pain.  Patient had undergone diagnostic heart cath at [facility] on 3/28.  Next day, she noticed swelling, pain and purplish discoloration in the right groin area.  No numbness tingling or weakness in the thigh or lower leg.   Ultrasound showed 6.5 cmx 4.4 cm area arising off of the CFA with corrector sticks of a partially thrombosed pseudoaneurysm, neck measures approximately 1 cmx 1.2 cm.  Vascular surgery was consulted, patient was admitted for further workup.  Assessment & Plan    Principal Problem:   Femoral artery pseudo-aneurysm, right (HCC) -Status post or diagnostic heart cath on 3/28 at [facility].  Now presented with right groin pseudoaneurysm -Vascular surgery consulted, plan for open right femoral pseudoaneurysm repair in the OR today, n.p.o. -Continue pain control   Active Problems: Leukocytosis -Likely reactive, no infectious signs or symptoms, stable no fevers    Normocytic anemia -Baseline 12 on 07/14/2023.   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  Resume Cozaar after surgery if BP elevated IV labetalol as needed  OSA -Not on CPAP  History of DVT  in 2018 -On Eliquis, currently on hold  Obesity class I Estimated body mass index is 31.34 kg/m as calculated from the following:   Height as of this encounter: 5' 2.01" (1.575 m).   Weight as of this encounter: 77.7 kg.  Code Status: DNR DVT Prophylaxis:  SCDs Start: 11/24/23 2007   Level of Care: Level of care: Telemetry Cardiac Family Communication: Updated patient's daughter at the bedside Disposition Plan:      Remains inpatient appropriate:      Procedures:    Consultants:   Vascular surgery  Antimicrobials:   Anti-infectives (From admission, onward)    None          Medications  acetaminophen  1,000 mg Oral Once   [MAR Hold] diltiazem  120 mg Oral BID   [MAR Hold] famotidine  20 mg Oral BID   [MAR Hold] levothyroxine  50 mcg Oral QAC breakfast   [MAR Hold] mycophenolate  250 mg Oral BID   [MAR Hold] pantoprazole  40 mg Oral Daily   [MAR Hold] senna-docusate  1 tablet Oral QHS   [MAR Hold] tacrolimus  0.5 mg Oral BID   [  MAR Hold] venlafaxine XR  37.5 mg Oral Q breakfast      Subjective:   [name] [name] was seen and examined today.  Seen this morning, area in the right groin purplish, induration, tender.  No chest pain, dizziness, lightheadedness, shortness of breath.  No nausea or vomiting.  Daughter at the bedside.  Objective:   Vitals:   11/25/23 0409 11/25/23 0757 11/25/23 0913 11/25/23 0915  BP: (!) 143/72 (!) 155/64  (!) 149/63  Pulse: 97 85  85  Resp: 18 19  17   Temp: 98.4 F (36.9 C) 98.1 F (36.7 C)  98.4 F (36.9 C)  TempSrc: Oral Oral  Oral  SpO2: 98% 95%  92%  Weight:   77.7 kg   Height:   5' 2.01" (1.575 m)     Intake/Output Summary (Last 24 hours) at 11/25/2023 1038 Last data filed at 11/25/2023 0800 Gross per 24 hour  Intake 0 ml  Output --  Net 0 ml     Wt Readings from Last 3 Encounters:  11/25/23 77.7 kg  07/13/23 75.7 kg  02/16/23 82.1 kg     Exam General: Alert and oriented x 3, NAD Cardiovascular: S1 S2  auscultated,  RRR Respiratory: Clear to auscultation bilaterally, no wheezing Gastrointestinal: Soft, nontender, nondistended, + bowel sounds Ext: trace pedal edema bilaterally Neuro: no new deficits Skin: Right groin with deformity of swelling and bruising, tender to touch Psych: Normal affect     Data Reviewed:  I have personally reviewed following labs    CBC Lab Results  Component Value Date   WBC 14.0 (H) 11/25/2023   RBC 3.83 (L) 11/25/2023   HGB 11.6 (L) 11/25/2023   HCT 33.8 (L) 11/25/2023   MCV 88.3 11/25/2023   MCH 30.3 11/25/2023   PLT 263 11/25/2023   MCHC 34.3 11/25/2023   RDW 13.2 11/25/2023   LYMPHSABS 2.0 11/24/2023   MONOABS 1.4 (H) 11/24/2023   EOSABS 0.2 11/24/2023   BASOSABS 0.1 11/24/2023     Last metabolic panel Lab Results  Component Value Date   NA 136 11/25/2023   K 4.0 11/25/2023   CL 108 11/25/2023   CO2 22 11/25/2023   BUN 12 11/25/2023   CREATININE 1.00 11/25/2023   GLUCOSE 127 (H) 11/25/2023   GFRNONAA 56 (L) 11/25/2023   GFRAA >60 01/30/2017   CALCIUM 8.6 (L) 11/25/2023   PHOS 3.1 07/06/2023   PROT 5.9 (L) 11/24/2023   ALBUMIN 2.9 (L) 11/24/2023   BILITOT 0.7 11/24/2023   ALKPHOS 73 11/24/2023   AST 20 11/24/2023   ALT 15 11/24/2023   ANIONGAP 6 11/25/2023    CBG (last 3)  No results for input(s): "GLUCAP" in the last 72 hours.    Coagulation Profile: No results for input(s): "INR", "PROTIME" in the last 168 hours.   Radiology Studies: I have personally reviewed the imaging studies  CT Angio Abd/Pel w/ and/or w/o Result Date: 11/24/2023 CLINICAL DATA:  Pseudoaneurysm.  Postprocedure swelling. EXAM: CTA ABDOMEN AND PELVIS WITHOUT AND WITH CONTRAST TECHNIQUE: Multidetector CT imaging of the abdomen and pelvis was performed using the standard protocol during bolus administration of intravenous contrast. Multiplanar reconstructed images and MIPs were obtained and reviewed to evaluate the vascular anatomy. RADIATION DOSE  REDUCTION: This exam was performed according to the departmental dose-optimization program which includes automated exposure control, adjustment of the mA and/or kV according to patient size and/or use of iterative reconstruction technique. CONTRAST:  75mL OMNIPAQUE IOHEXOL 350 MG/ML SOLN COMPARISON:  Abdominopelvic CT 09/13/2023  FINDINGS: VASCULAR Aorta: Calcified and noncalcified plaque in the descending thoracic aorta. Calcified plaque in the abdominal aorta. No aneurysm, dissection, or significant stenosis. Celiac: Patent without evidence of aneurysm, dissection, vasculitis or significant stenosis. SMA: Patent without evidence of aneurysm, dissection, vasculitis or significant stenosis. Renals: Plaque at the origin of the renal arteries causes mild stenosis. No dissection, aneurysm or acute findings. IMA: Patent without evidence of aneurysm, dissection, vasculitis or significant stenosis. Inflow: Patent without evidence of aneurysm, dissection, vasculitis or significant stenosis. Proximal Outflow: Pseudoaneurysm arising from the right common femoral artery just before the profundus femora takeoff. This is bilobed in appearance and spans approximately 3.1 x 4.1 cm, series 5, image 221 and series 8, image 104. Thin tail extends anteriorly, series 5, image 226 to an anterior groin hematoma measuring 3.9 x 3.2 x 4.8 cm, series 5, image 232 and series 8, image 122. Moderate amount of adjacent fat stranding. The right common femoral and profundus femoral arteries are patent. Left outflow tract is widely patent. Veins: No obvious venous abnormality within the limitations of this arterial phase study. Review of the MIP images confirms the above findings. NON-VASCULAR Lower chest: Heterogeneous pulmonary parenchyma. Distal esophagus is dilated and fluid-filled. Small to moderate-sized hiatal hernia. Hepatobiliary: No focal liver abnormality on this arterial phase exam. Unremarkable gallbladder. No biliary dilatation.  Pancreas: Fatty atrophy.  No ductal dilatation or inflammation. Spleen: Normal in size and arterial enhancement. Adrenals/Urinary Tract: No adrenal nodule. Right renal atrophy. Subcentimeter bilateral renal lesions are too small to characterize. No further follow-up imaging is recommended. Unremarkable urinary bladder. Stomach/Bowel: Fluid-filled distal esophagus. Small to moderate hiatal hernia. 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 There is no small bowel obstruction or inflammation. Moderate colonic diverticulosis. No diverticulitis. Colon is redundant. The appendix is not definitively seen. Cecum is located in the right upper quadrant. Lymphatic: No adenopathy. Reproductive: Status post hysterectomy. No adnexal masses. Other: No ascites.  No free intra-abdominal air. Musculoskeletal: There are no acute or suspicious osseous abnormalities. Lower lumbar facet hypertrophy. IMPRESSION: 1. Pseudoaneurysm arising from the right common femoral artery. This is bilobed in appearance and spans approximately 3.1 x 4.1 cm. Thin tail extends anteriorly to an anterior groin hematoma measuring 3.9 x 3.2 x 4.8 cm. 2. Colonic diverticulosis without diverticulitis. 3. Small to moderate hiatal hernia. Fluid-filled distal esophagus may be due to reflux or dysmotility. Aortic Atherosclerosis (ICD10-I70.0). Electronically Signed   By: Narda Rutherford M.D.   On: 11/24/2023 19:52   VAS [place] GROIN PSEUDOANEURYSM Result Date: 11/24/2023  ARTERIAL PSEUDOANEURYSM  Patient Name:  [name] [name]  Date of Exam:   11/24/2023 Medical Rec #: [id number]            Accession #:    [id number] Date of Birth: 04/15/1941           Patient Gender: F Patient Age:   1 years Exam Location:  [facility] Procedure:      VAS [place] Bobetta Lime Referring Phys: Alvino Blood --------------------------------------------------------------------------------  Exam: Right groin Indications:  Patient complains of groin pain, bruising and palpable knot. History: S/p catheterization. Limitations: Localized edema. Comparison Study: No prior exam. Performing Technologist: Fernande Bras  Examination Guidelines: A complete evaluation includes B-mode imaging, spectral Doppler, color Doppler, and power Doppler as needed of all accessible portions of each vessel. Bilateral testing is considered an integral part of a complete examination. Limited examinations for reoccurring indications may be performed as noted. +------------+----------+----------+------+----------+ Right DuplexPSV (  cm/s) Waveform PlaqueComment(s) +------------+----------+----------+------+----------+ Ext.Iliac      195     biphasic                  +------------+----------+----------+------+----------+ CFA            357    monophasic                 +------------+----------+----------+------+----------+ PFA             91    monophasic                 +------------+----------+----------+------+----------+ Prox SFA       106    monophasic                 +------------+----------+----------+------+----------+ Right Vein comments:The EIV, CFV and FV prox are patent with phasic flow.  Findings: An area with well defined borders measuring 6.5 cm x 4.4 cm was visualized arising off of the CFA with ultrasound characteristics of a partially thrombosed pseudoaneurysm. The neck measures approximately 1.0 cm wide and 1.2 cm long.   Suggest Peripheral Vascular Consult.    --------------------------------------------------------------------------------    Preliminary        Thad Ranger M.D. [facility] 11/25/2023, 10:38 AM  Available via Epic secure chat 7am-7pm After 7 pm, please refer to night coverage provider listed on amion.

## 2023-11-25 NOTE — Transfer of Care (Signed)
 Immediate Anesthesia Transfer of Care Note  Patient: [name] [name]  Procedure(s) Performed: REPAIR, PSEUDOANEURYSM RIGHT FEMORAL (Right: Groin)  Patient Location: PACU  Anesthesia Type:General  Level of Consciousness: awake, alert , and oriented  Airway & Oxygen Therapy: Patient Spontanous Breathing and Patient connected to nasal cannula oxygen  Post-op Assessment: Report given to RN, Post -op Vital signs reviewed and stable, and Patient moving all extremities X 4  Post vital signs: Reviewed and stable  Last Vitals:  Vitals Value Taken Time  BP 165/45 11/25/23 1246  Temp 98   Pulse 106 11/25/23 1253  Resp 18 11/25/23 1253  SpO2 94 % 11/25/23 1253  Vitals shown include unfiled device data.  Last Pain:  Vitals:   11/25/23 0915  TempSrc: Oral  PainSc:       Patients Stated Pain Goal: 3 (11/24/23 2153)  Complications: There were no known notable events for this encounter.

## 2023-11-25 NOTE — Anesthesia Preprocedure Evaluation (Addendum)
 Anesthesia Evaluation  Patient identified by MRN, date of birth, ID band Patient awake    Reviewed: Allergy & Precautions, NPO status , Patient's Chart, lab work & pertinent test results  History of Anesthesia Complications Negative for: history of anesthetic complications  Airway Mallampati: II  TM Distance: >3 FB Neck ROM: Full    Dental  (+) Dental Advisory Given, Teeth Intact   Pulmonary former smoker   Pulmonary exam normal        Cardiovascular hypertension, Pt. on medications + DVT  + dysrhythmias Atrial Fibrillation (-) pacemaker+ Valvular Problems/Murmurs AI  Rhythm:Irregular Rate:Normal   S/p heart transplant  '24 TTE - EF 60 to 65%. Left atrial size was severely dilated. Right atrial size was severely dilated. Mild mitral valve regurgitation. Aortic valve regurgitation is moderate to severe. Aortic regurgitation PHT measures 276 msec.     Neuro/Psych  PSYCHIATRIC DISORDERS Anxiety Depression    negative neurological ROS     GI/Hepatic Neg liver ROS,GERD  Medicated and Controlled,,  Endo/Other  Hypothyroidism   Obesity   Renal/GU negative Renal ROS     Musculoskeletal negative musculoskeletal ROS (+)    Abdominal   Peds  Hematology  (+) Blood dyscrasia, anemia  On eliquis    Anesthesia Other Findings   Reproductive/Obstetrics                             Anesthesia Physical Anesthesia Plan  ASA: 3  Anesthesia Plan: General   Post-op Pain Management: Tylenol PO (pre-op)*   Induction: Intravenous  PONV Risk Score and Plan: 3 and Treatment may vary due to age or medical condition, Ondansetron and Propofol infusion  Airway Management Planned: Oral ETT  Additional Equipment: ClearSight  Intra-op Plan:   Post-operative Plan: Extubation in OR  Informed Consent: I have reviewed the patients History and Physical, chart, labs and discussed the procedure including the  risks, benefits and alternatives for the proposed anesthesia with the patient or authorized representative who has indicated his/her understanding and acceptance.   Patient has DNR.  Discussed DNR with patient and Suspend DNR.   Dental advisory given  Plan Discussed with: CRNA and Anesthesiologist  Anesthesia Plan Comments:         Anesthesia Quick Evaluation

## 2023-11-26 ENCOUNTER — Encounter (HOSPITAL_COMMUNITY): Payer: Self-pay | Admitting: Vascular Surgery

## 2023-11-26 DIAGNOSIS — D649 Anemia, unspecified: Secondary | ICD-10-CM

## 2023-11-26 DIAGNOSIS — Z941 Heart transplant status: Secondary | ICD-10-CM | POA: Diagnosis not present

## 2023-11-26 DIAGNOSIS — I724 Aneurysm of artery of lower extremity: Secondary | ICD-10-CM | POA: Diagnosis not present

## 2023-11-26 DIAGNOSIS — I10 Essential (primary) hypertension: Secondary | ICD-10-CM | POA: Diagnosis not present

## 2023-11-26 DIAGNOSIS — D72823 Leukemoid reaction: Secondary | ICD-10-CM | POA: Diagnosis not present

## 2023-11-26 LAB — CBC
HCT: 28.8 % — ABNORMAL LOW (ref 36.0–46.0)
Hemoglobin: 9.6 g/dL — ABNORMAL LOW (ref 12.0–15.0)
MCH: 30.4 pg (ref 26.0–34.0)
MCHC: 33.3 g/dL (ref 30.0–36.0)
MCV: 91.1 fL (ref 80.0–100.0)
Platelets: 256 10*3/uL (ref 150–400)
RBC: 3.16 MIL/uL — ABNORMAL LOW (ref 3.87–5.11)
RDW: 13.3 % (ref 11.5–15.5)
WBC: 28 10*3/uL — ABNORMAL HIGH (ref 4.0–10.5)
nRBC: 0 % (ref 0.0–0.2)

## 2023-11-26 LAB — BASIC METABOLIC PANEL WITH GFR
Anion gap: 10 (ref 5–15)
BUN: 17 mg/dL (ref 8–23)
CO2: 19 mmol/L — ABNORMAL LOW (ref 22–32)
Calcium: 9.1 mg/dL (ref 8.9–10.3)
Chloride: 107 mmol/L (ref 98–111)
Creatinine, Ser: 1.24 mg/dL — ABNORMAL HIGH (ref 0.44–1.00)
GFR, Estimated: 43 mL/min — ABNORMAL LOW (ref >60)
Glucose, Bld: 170 mg/dL — ABNORMAL HIGH (ref 70–99)
Potassium: 4.3 mmol/L (ref 3.5–5.1)
Sodium: 136 mmol/L (ref 135–145)

## 2023-11-26 LAB — PROCALCITONIN: Procalcitonin: <0.1 ng/mL

## 2023-11-26 NOTE — Progress Notes (Addendum)
 [facility]  Subjective  - Very painful in the right groin when trying to move around.  She lives at [facility] living community with her husband who has Parkinson disease.  She is worried about caring for him and herself while she is recovering.     Objective (!) 135/56 94 98.6 F (37 C) (Oral) (!) 21 100%  Intake/Output Summary (Last 24 hours) at 11/26/2023 1610 Last data filed at 11/25/2023 1810 Gross per 24 hour  Intake 1445.31 ml  Output 805 ml  Net 640.31 ml    Right groin ecchymosis, softer Incision intact and healing well without recurrent hematoma Palpable DP pulse right LE Slow to ambulate around the bed with assistance to sit in the chair this am. Heart currently in sinus rhyme   Assessment/Planning: Right femoral Pseudoaneurysm post cardiac cath 11/23/23 S/P Right femoral artery pseudoaneurysm repair and evacuation of hematoma right thigh  Good inflow with palpable pedal pulse on the right LE Ecchymosis right thigh, softer compartments, incision is healing well  HGB stable 11.6 Mild leukocytosis will order IS Afib managed with Eliquis.  Eliquis currently on hold.   Plan for restarting Eliquis tomorrow.    Mobility, PT eval ordered and TOC consult for possible assist needs during her recovery and to help with her husband who has Parkinson disease.  Stable from a vascular point of view home when ready per Primary team.      Mosetta Pigeon 11/26/2023 8:03 AM --  Laboratory Lab Results: Recent Labs    11/24/23 1755 11/25/23 0343  WBC 14.0* 14.0*  HGB 11.6* 11.6*  HCT 34.5* 33.8*  PLT 251 263   BMET Recent Labs    11/24/23 1755 11/25/23 0343  NA 136 136  K 3.7 4.0  CL 109 108  CO2 19* 22  GLUCOSE 107* 127*  BUN 14 12  CREATININE 1.00 1.00  CALCIUM 8.5* 8.6*    COAG No results found for: "INR", "PROTIME" No results found for: "PTT"  VASCULAR STAFF ADDENDUM: I have independently interviewed and  examined the patient. I agree with the above.  Recovering well from right femoral artery pseudoaneurysm.  Palpable DP bilaterally Plan to restart Eliquis tomorrow morning.  Okay with discharge today but she reports she would like to see PT and potentially stay till this evening or tomorrow morning depending on her steadiness.  Daria Pastures MD [facility] Phone Number: [phone] 11/26/2023 11:20 AM

## 2023-11-26 NOTE — Progress Notes (Signed)
 [facility]                                                                              [name] [name], is a 83 y.o. female, DOB - 1941/03/15, ZOX:[id number] Admit date - 11/24/2023    Outpatient Primary MD for the patient is [name] [name]., MD  LOS - 2  days  Chief Complaint  Patient presents with   post procedure swelling       Brief summary   Patient is a 83 year old female with a history of heart transplant in 2009, DVT in 2018 on Eliquis , hypothyroidism, HTN, anxiety, depression, parathyroidectomy, GERD, osteoporosis, OSA on not on CPAP, presented with right groin swelling and pain.  Patient had undergone diagnostic heart cath at [facility] on 3/28.  Next day, she noticed swelling, pain and purplish discoloration in the right groin area.  No numbness tingling or weakness in the thigh or lower leg.   Ultrasound showed 6.5 cmx 4.4 cm area arising off of the CFA with corrector sticks of a partially thrombosed pseudoaneurysm, neck measures approximately 1 cmx 1.2 cm.  Vascular surgery was consulted, patient was admitted for further workup.  Assessment & Plan    Principal Problem:   Femoral artery pseudo-aneurysm, right (HCC) -Status post or diagnostic heart cath on 3/28 at [facility].  Now presented with right groin pseudoaneurysm -Vascular surgery consulted - S/P Right femoral artery pseudoaneurysm repair and evacuation of hematoma right thigh on 3/30 -Continue pain control, incentive spirometry, PT OT evaluation -Per vascular surgery, okay to start Eliquis tomorrow    Active Problems: Leukocytosis -Likely reactive, no infectious signs or symptoms, stable no fevers  -Obtain blood cultures, procalcitonin, repeat CBC in a.m.  Normocytic anemia -Baseline 12 on 07/14/2023.   -H&H currently stable, 9.6, follow closely, no bleeding   History of heart transplant in 2009 -Mild nonobstructive CAD seen on diagnostic heart cath on 3/28 -Hold aspirin,  eliquis -Continue CellCept and Prograf per pharmacy    Hypertension Continue Cardizem.  Resume Cozaar after surgery if BP elevated IV labetalol as needed  OSA -Not on CPAP  History of DVT in 2018 -On Eliquis, currently on hold  Obesity class I Estimated body mass index is 31.34 kg/m as calculated from the following:   Height as of this encounter: 5' 2.01" (1.575 m).   Weight as of this encounter: 77.7 kg.  Code Status: DNR DVT Prophylaxis:  SCDs Start: 11/24/23 2007   Level of Care: Level of care: Telemetry Cardiac Family Communication: Updated patient's daughter yesterday Disposition Plan:      Remains inpatient appropriate:   PT evaluation done, patient is concerned about taking care of herself and her husband who has Parkinson's, would like to stay overnight and DC home tomorrow.  PT OT will work with her again tomorrow.   Procedures:  S/P Right femoral artery pseudoaneurysm repair and evacuation of hematoma right thigh   Consultants:   Vascular surgery  Antimicrobials:   Anti-infectives (From admission, onward)    None          Medications  diltiazem  120 mg Oral BID   famotidine  20 mg Oral BID   levothyroxine  50 mcg Oral QAC breakfast   mycophenolate  250 mg Oral BID   pantoprazole  40 mg Oral Daily   senna-docusate  1 tablet Oral QHS   tacrolimus  0.5 mg Oral BID   venlafaxine XR  37.5 mg Oral Q breakfast      Subjective:   [name] [name] was seen and examined today.  Sitting up in the chair, no acute complaints, no fever chills or worsening pain.  No cough, nausea or vomiting or diarrhea.    Objective:   Vitals:   11/25/23 1938 11/25/23 2320 11/26/23 0741 11/26/23 1124  BP: 114/80 (!) 135/56 (!) 148/48 (!) 143/49  Pulse: 98 94 93 89  Resp: 20 (!) 21 17 20   Temp: 98.4 F (36.9 C) 98.6 F (37 C) 98.5 F (36.9 C) 97.8 F (36.6 C)  TempSrc: Oral Oral Oral Oral  SpO2:  100% 96% 93%  Weight:      Height:        Intake/Output Summary  (Last 24 hours) at 11/26/2023 1326 Last data filed at 11/26/2023 1000 Gross per 24 hour  Intake 480 ml  Output 300 ml  Net 180 ml     Wt Readings from Last 3 Encounters:  11/25/23 77.7 kg  07/13/23 75.7 kg  02/16/23 82.1 kg   Physical Exam General: Alert and oriented x 3, NAD Cardiovascular: S1 S2 clear, RRR.  Respiratory: CTAB, no wheezing Gastrointestinal: Soft, nontender, nondistended, NBS Ext: no pedal edema bilaterally Neuro: no new deficits Skin: Right groin area softer, decreased ecchymosis. Psych: Normal affect      Data Reviewed:  I have personally reviewed following labs    CBC Lab Results  Component Value Date   WBC 28.0 (H) 11/26/2023   RBC 3.16 (L) 11/26/2023   HGB 9.6 (L) 11/26/2023   HCT 28.8 (L) 11/26/2023   MCV 91.1 11/26/2023   MCH 30.4 11/26/2023   PLT 256 11/26/2023   MCHC 33.3 11/26/2023   RDW 13.3 11/26/2023   LYMPHSABS 2.0 11/24/2023   MONOABS 1.4 (H) 11/24/2023   EOSABS 0.2 11/24/2023   BASOSABS 0.1 11/24/2023     Last metabolic panel Lab Results  Component Value Date   NA 136 11/26/2023   K 4.3 11/26/2023   CL 107 11/26/2023   CO2 19 (L) 11/26/2023   BUN 17 11/26/2023   CREATININE 1.24 (H) 11/26/2023   GLUCOSE 170 (H) 11/26/2023   GFRNONAA 43 (L) 11/26/2023   GFRAA >60 01/30/2017   CALCIUM 9.1 11/26/2023   PHOS 3.1 07/06/2023   PROT 5.9 (L) 11/24/2023   ALBUMIN 2.9 (L) 11/24/2023   BILITOT 0.7 11/24/2023   ALKPHOS 73 11/24/2023   AST 20 11/24/2023   ALT 15 11/24/2023   ANIONGAP 10 11/26/2023    CBG (last 3)  No results for input(s): "GLUCAP" in the last 72 hours.    Coagulation Profile: No results for input(s): "INR", "PROTIME" in the last 168 hours.   Radiology Studies: I have personally reviewed the imaging studies  VAS [place] GROIN PSEUDOANEURYSM Result Date: 11/25/2023  ARTERIAL PSEUDOANEURYSM  Patient Name:  [name] [name]  Date of Exam:   11/24/2023 Medical Rec #: [id number]            Accession #:     [id number] Date of Birth: [birth year]           Patient Gender: F Patient Age:   48 years Exam Location:  [facility] Procedure:  VAS [place] GROIN PSEUDOANEURYSM Referring Phys: Alvino Blood --------------------------------------------------------------------------------  Exam: Right groin Indications: Patient complains of groin pain, bruising and palpable knot. History: S/p catheterization. Limitations: Localized edema. Comparison Study: No prior exam. Performing Technologist: Fernande Bras  Examination Guidelines: A complete evaluation includes B-mode imaging, spectral Doppler, color Doppler, and power Doppler as needed of all accessible portions of each vessel. Bilateral testing is considered an integral part of a complete examination. Limited examinations for reoccurring indications may be performed as noted. +------------+----------+----------+------+----------+ Right DuplexPSV (cm/s) Waveform PlaqueComment(s) +------------+----------+----------+------+----------+ Ext.Iliac      195     biphasic                  +------------+----------+----------+------+----------+ CFA            357    monophasic                 +------------+----------+----------+------+----------+ PFA             91    monophasic                 +------------+----------+----------+------+----------+ Prox SFA       106    monophasic                 +------------+----------+----------+------+----------+ Right Vein comments:The EIV, CFV and FV prox are patent with phasic flow.  Findings: An area with well defined borders measuring 6.5 cm x 4.4 cm was visualized arising off of the CFA with ultrasound characteristics of a partially thrombosed pseudoaneurysm. The neck measures approximately 1.0 cm wide and 1.2 cm long.   Suggest Peripheral Vascular Consult. Diagnosing physician: Carolynn Sayers Electronically signed by Carolynn Sayers on 11/25/2023 at 1:07:32 PM.     --------------------------------------------------------------------------------    Final    CT Angio Abd/Pel w/ and/or w/o Result Date: 11/24/2023 CLINICAL DATA:  Pseudoaneurysm.  Postprocedure swelling. EXAM: CTA ABDOMEN AND PELVIS WITHOUT AND WITH CONTRAST TECHNIQUE: Multidetector CT imaging of the abdomen and pelvis was performed using the standard protocol during bolus administration of intravenous contrast. Multiplanar reconstructed images and MIPs were obtained and reviewed to evaluate the vascular anatomy. RADIATION DOSE REDUCTION: This exam was performed according to the departmental dose-optimization program which includes automated exposure control, adjustment of the mA and/or kV according to patient size and/or use of iterative reconstruction technique. CONTRAST:  75mL OMNIPAQUE IOHEXOL 350 MG/ML SOLN COMPARISON:  Abdominopelvic CT 09/13/2023 FINDINGS: VASCULAR Aorta: Calcified and noncalcified plaque in the descending thoracic aorta. Calcified plaque in the abdominal aorta. No aneurysm, dissection, or significant stenosis. Celiac: Patent without evidence of aneurysm, dissection, vasculitis or significant stenosis. SMA: Patent without evidence of aneurysm, dissection, vasculitis or significant stenosis. Renals: Plaque at the origin of the renal arteries causes mild stenosis. No dissection, aneurysm or acute findings. IMA: Patent without evidence of aneurysm, dissection, vasculitis or significant stenosis. Inflow: Patent without evidence of aneurysm, dissection, vasculitis or significant stenosis. Proximal Outflow: Pseudoaneurysm arising from the right common femoral artery just before the profundus femora takeoff. This is bilobed in appearance and spans approximately 3.1 x 4.1 cm, series 5, image 221 and series 8, image 104. Thin tail extends anteriorly, series 5, image 226 to an anterior groin hematoma measuring 3.9 x 3.2 x 4.8 cm, series 5, image 232 and series 8, image 122. Moderate amount of  adjacent fat stranding. The right common femoral and profundus femoral arteries are patent. Left outflow tract is widely patent. Veins: No obvious venous abnormality within the limitations of this arterial phase  study. Review of the MIP images confirms the above findings. NON-VASCULAR Lower chest: Heterogeneous pulmonary parenchyma. Distal esophagus is dilated and fluid-filled. Small to moderate-sized hiatal hernia. Hepatobiliary: No focal liver abnormality on this arterial phase exam. Unremarkable gallbladder. No biliary dilatation. Pancreas: Fatty atrophy.  No ductal dilatation or inflammation. Spleen: Normal in size and arterial enhancement. Adrenals/Urinary Tract: No adrenal nodule. Right renal atrophy. Subcentimeter bilateral renal lesions are too small to characterize. No further follow-up imaging is recommended. Unremarkable urinary bladder. Stomach/Bowel: Fluid-filled distal esophagus. Small to moderate hiatal hernia. 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 There is no small bowel obstruction or inflammation. Moderate colonic diverticulosis. No diverticulitis. Colon is redundant. The appendix is not definitively seen. Cecum is located in the right upper quadrant. Lymphatic: No adenopathy. Reproductive: Status post hysterectomy. No adnexal masses. Other: No ascites.  No free intra-abdominal air. Musculoskeletal: There are no acute or suspicious osseous abnormalities. Lower lumbar facet hypertrophy. IMPRESSION: 1. Pseudoaneurysm arising from the right common femoral artery. This is bilobed in appearance and spans approximately 3.1 x 4.1 cm. Thin tail extends anteriorly to an anterior groin hematoma measuring 3.9 x 3.2 x 4.8 cm. 2. Colonic diverticulosis without diverticulitis. 3. Small to moderate hiatal hernia. Fluid-filled distal esophagus may be due to reflux or dysmotility. Aortic Atherosclerosis (ICD10-I70.0). Electronically Signed   By: Narda Rutherford M.D.    On: 11/24/2023 19:52       Khyrie Masi M.D. [facility] 11/26/2023, 1:26 PM  Available via Epic secure chat 7am-7pm After 7 pm, please refer to night coverage provider listed on amion.

## 2023-11-26 NOTE — Evaluation (Signed)
 Occupational Therapy Evaluation Patient Details Name: [name] [name] MRN: [id number] DOB: [birth year] Today's Date: 11/26/2023   History of Present Illness   83 y.o. female presents to [facility] hospital on 11/24/2023 with R groin swelling and pain after heart cath on 3/28. Ultrasound revealed R femoral artery pseudoaneurysm. Pt underwent pseudoaneurysm repair on 3/30. PMH includes heart transplant 2009, afib, DVT, hypothyroidism, HTN, anxiety, depression, GERD, osteoporosis, OSA.     Clinical Impressions PTA, pt lives at [facility]F with spouse who has Parkinson's and receives caregiver assistance. Pt is typically Independent with ADLs, IADLs, driving and mobility without AD. Pt presents now with deficits d/t R groin discomfort and requiring RW for mobility w/ Supervision-CGA. Pt requiring Setup for UB ADLs and Supervision-Min A for LB ADLs. Recommend use of RW for mobility initially, BSC over toilet to ease transfers and HHOT follow up to ensure smooth recovery back to independent baseline. Will follow up tomorrow morning for OT session prior to discharge.     If plan is discharge home, recommend the following:   Assistance with cooking/housework;Assist for transportation     Functional Status Assessment   Patient has had a recent decline in their functional status and demonstrates the ability to make significant improvements in function in a reasonable and predictable amount of time.     Equipment Recommendations   BSC/3in1     Recommendations for Other Services         Precautions/Restrictions   Precautions Precautions: Fall Recall of Precautions/Restrictions: Intact Precaution/Restrictions Comments: R groin hematoma Restrictions Weight Bearing Restrictions Per Provider Order: No     Mobility Bed Mobility Overal bed mobility: Needs Assistance Bed Mobility: Supine to Sit, Sit to Supine     Supine to sit: Supervision Sit to supine: Min assist   General bed mobility  comments: required Min A to place BLE back into bed. Assisted into sidelying with pillow at back    Transfers Overall transfer level: Needs assistance Equipment used: Rolling walker (2 wheels) Transfers: Sit to/from Stand Sit to Stand: Supervision           General transfer comment: from bedside and BSC over toilet      Balance Overall balance assessment: Needs assistance Sitting-balance support: No upper extremity supported, Feet supported Sitting balance-Leahy Scale: Good     Standing balance support: Single extremity supported, Reliant on assistive device for balance, Bilateral upper extremity supported, No upper extremity supported Standing balance-Leahy Scale: Fair                             ADL either performed or assessed with clinical judgement   ADL Overall ADL's : Needs assistance/impaired Eating/Feeding: Independent   Grooming: Standing;Supervision/safety;Wash/dry hands Grooming Details (indicate cue type and reason): reaching to faucet w/o issues, no LOB standing at sink Upper Body Bathing: Set up;Sitting   Lower Body Bathing: Supervison/ safety;Sitting/lateral leans;Sit to/from stand   Upper Body Dressing : Set up;Sitting   Lower Body Dressing: Minimal assistance;Supervision/safety;Sitting/lateral leans;Sit to/from stand Lower Body Dressing Details (indicate cue type and reason): typically sits and bends down to feet for sock and shoe mgmt but unable to do so EOB today due to R groin pain. Able to cross RLE with increased effort to reach foot but anticipate will improve. Educated to don affected LE first in pants/underwear to increase ease of task. Toilet Transfer: Ambulation;Rolling walker (2 wheels);Supervision/safety;Contact guard assist Toilet Transfer Details (indicate cue type and reason): placed BSC over toilet  to increase ease of transfer and minimize R groin pain w/ pt reporting comfort Toileting- Clothing Manipulation and Hygiene:  Supervision/safety;Sitting/lateral lean;Sit to/from stand       Functional mobility during ADLs: Contact guard assist;Supervision/safety;Rolling walker (2 wheels)       Vision Baseline Vision/History: 1 Wears glasses Ability to See in Adequate Light: 0 Adequate Patient Visual Report: No change from baseline Vision Assessment?: No apparent visual deficits     Perception         Praxis         Pertinent Vitals/Pain Pain Assessment Pain Assessment: Faces Faces Pain Scale: Hurts even more Pain Location: R groin Pain Descriptors / Indicators: Sore Pain Intervention(s): Monitored during session     Extremity/Trunk Assessment Upper Extremity Assessment Upper Extremity Assessment: Overall WFL for tasks assessed;Right hand dominant   Lower Extremity Assessment Lower Extremity Assessment: Defer to PT evaluation   Cervical / Trunk Assessment Cervical / Trunk Assessment: Normal   Communication Communication Communication: No apparent difficulties   Cognition Arousal: Alert Behavior During Therapy: WFL for tasks assessed/performed Cognition: No apparent impairments                               Following commands: Intact       Cueing  General Comments   Cueing Techniques: Verbal cues  VSS on RA, pt does report dizziness with initial ambulation and after sitting upon completion of mobility. BP 143/52, HR in 90s   Exercises     Shoulder Instructions      Home Living Family/patient expects to be discharged to:: Other (Comment) ([facility] at [place])                                 Additional Comments: Spouse has Parkinson's disease- has caregiver for spouse that comes in to assist with breakfast and bathing/dressing. Pt likely able to increase hours. Pt has access to RW, shower chair and grab bars in walk in shower but unsure if she has access to [facility]. reports standard height toilet      Prior Functioning/Environment Prior  Level of Function : Independent/Modified Independent             Mobility Comments: ambulatory without DME ADLs Comments: Indep with ADLs, basic IADLs, goes to dining hall for dinner. assists spouse with donning socks (gets down on floor for this), drives    OT Problem List: Pain;Decreased knowledge of use of DME or AE;Decreased activity tolerance;Impaired balance (sitting and/or standing)   OT Treatment/Interventions: Therapeutic exercise;Self-care/ADL training;DME and/or AE instruction;Energy conservation;Therapeutic activities;Patient/family education;Balance training      OT Goals(Current goals can be found in the care plan section)   Acute Rehab OT Goals Patient Stated Goal: stay another night, feel comfortable going home OT Goal Formulation: With patient Time For Goal Achievement: 12/10/23 Potential to Achieve Goals: Good ADL Goals Pt Will Perform Lower Body Dressing: with modified independence;sit to/from stand;sitting/lateral leans;with adaptive equipment Pt Will Transfer to Toilet: with modified independence;ambulating Additional ADL Goal #1: Pt to complete bed mobility with Modified Independence as precursor to OOB ADLs/mobility   OT Frequency:  Min 2X/week    Co-evaluation              AM-PAC OT "6 Clicks" Daily Activity     Outcome Measure Help from another person eating meals?: None Help from another person taking care of  personal grooming?: A Little Help from another person toileting, which includes using toliet, bedpan, or urinal?: A Little Help from another person bathing (including washing, rinsing, drying)?: A Little Help from another person to put on and taking off regular upper body clothing?: A Little Help from another person to put on and taking off regular lower body clothing?: A Little 6 Click Score: 19   End of Session Equipment Utilized During Treatment: Rolling walker (2 wheels) Nurse Communication: Mobility status;Patient requests pain  meds  Activity Tolerance: Patient tolerated treatment well Patient left: in bed;with call bell/phone within reach;with bed alarm set  OT Visit Diagnosis: Unsteadiness on feet (R26.81);Pain Pain - Right/Left: Right Pain - part of body:  (groin)                Time: 0981-1914 OT Time Calculation (min): 24 min Charges:  OT General Charges $OT Visit: 1 Visit OT Evaluation $OT Eval Moderate Complexity: 1 Mod  Bradd Canary, OTR/L Acute Rehab Services Office: [phone]   Lorre Munroe 11/26/2023, 2:28 PM

## 2023-11-26 NOTE — Evaluation (Signed)
 Physical Therapy Evaluation Patient Details Name: [name] [name] MRN: [id number] DOB: 1940/09/14 Today's Date: 11/26/2023  History of Present Illness  83 y.o. female presents to [facility] hospital on 11/24/2023 with R groin swelling and pain after heart cath on 3/28. Ultrasound revealed R femoral artery pseudoaneurysm. Pt underwent pseudoaneurysm repair on 3/30. PMH includes heart transplant 2009, afib, DVT, hypothyroidism, HTN, anxiety, depression, GERD, osteoporosis, OSA.  Clinical Impression  Pt presents to PT with deficits in functional mobility, gait, balance, power, endurance. Pt is limited by R groin pain, however she does ambulate for short household distances with support of RW. Pt will benefit from frequent mobilization to aide in improving mobility tolerance and reducing pain at R hematoma site. PT will follow up tomorrow to provide further mobility training.        If plan is discharge home, recommend the following: A little help with bathing/dressing/bathroom;Assistance with cooking/housework;Assist for transportation;Help with stairs or ramp for entrance   Can travel by private vehicle        Equipment Recommendations BSC/3in1  Recommendations for Other Services       Functional Status Assessment Patient has had a recent decline in their functional status and demonstrates the ability to make significant improvements in function in a reasonable and predictable amount of time.     Precautions / Restrictions Precautions Precautions: Fall Recall of Precautions/Restrictions: Intact Precaution/Restrictions Comments: R groin hematoma Restrictions Weight Bearing Restrictions Per Provider Order: No      Mobility  Bed Mobility Overal bed mobility: Needs Assistance Bed Mobility: Supine to Sit, Sit to Supine     Supine to sit: Supervision Sit to supine: Supervision        Transfers Overall transfer level: Needs assistance Equipment used: Rolling walker (2  wheels) Transfers: Sit to/from Stand Sit to Stand: Contact guard assist                Ambulation/Gait Ambulation/Gait assistance: Contact guard assist Gait Distance (Feet): 70 Feet Assistive device: Rolling walker (2 wheels) Gait Pattern/deviations: Step-through pattern Gait velocity: reduced Gait velocity interpretation: <1.8 ft/sec, indicate of risk for recurrent falls   General Gait Details: slowed step-through gait, reduced stride length  Stairs            Wheelchair Mobility     Tilt Bed    Modified Rankin (Stroke Patients Only)       Balance Overall balance assessment: Needs assistance Sitting-balance support: No upper extremity supported, Feet supported Sitting balance-Leahy Scale: Good     Standing balance support: Single extremity supported, Reliant on assistive device for balance Standing balance-Leahy Scale: Poor                               Pertinent Vitals/Pain Pain Assessment Pain Assessment: 0-10 Pain Score: 5  Pain Location: R groin Pain Descriptors / Indicators: Sore Pain Intervention(s): Monitored during session    Home Living Family/patient expects to be discharged to:: Other (Comment) (Independent Living at [facility])                   Additional Comments: Spouse has Parkinson's disease    Prior Function Prior Level of Function : Independent/Modified Independent             Mobility Comments: ambulatory without DME       Extremity/Trunk Assessment   Upper Extremity Assessment Upper Extremity Assessment: Overall WFL for tasks assessed    Lower Extremity Assessment Lower  Extremity Assessment: Generalized weakness    Cervical / Trunk Assessment Cervical / Trunk Assessment: Normal  Communication   Communication Communication: No apparent difficulties    Cognition Arousal: Alert Behavior During Therapy: WFL for tasks assessed/performed   PT - Cognitive impairments: No apparent  impairments                         Following commands: Intact       Cueing Cueing Techniques: Verbal cues     General Comments General comments (skin integrity, edema, etc.): VSS on RA, pt does report dizziness with initial ambulation and after sitting upon completion of mobility. BP 143/52, HR in 90s    Exercises     Assessment/Plan    PT Assessment Patient needs continued PT services  PT Problem List Decreased strength;Decreased activity tolerance;Decreased balance;Decreased mobility;Pain       PT Treatment Interventions DME instruction;Gait training;Functional mobility training;Therapeutic activities;Therapeutic exercise;Balance training;Neuromuscular re-education;Patient/family education    PT Goals (Current goals can be found in the Care Plan section)  Acute Rehab PT Goals Patient Stated Goal: to reduce R groin pain and restore independence PT Goal Formulation: With patient Time For Goal Achievement: 12/10/23 Potential to Achieve Goals: Good    Frequency Min 3X/week     Co-evaluation               AM-PAC PT "6 Clicks" Mobility  Outcome Measure Help needed turning from your back to your side while in a flat bed without using bedrails?: A Little Help needed moving from lying on your back to sitting on the side of a flat bed without using bedrails?: A Little Help needed moving to and from a bed to a chair (including a wheelchair)?: A Little Help needed standing up from a chair using your arms (e.g., wheelchair or bedside chair)?: A Little Help needed to walk in hospital room?: A Little Help needed climbing 3-5 steps with a railing? : A Lot 6 Click Score: 17    End of Session   Activity Tolerance: Patient tolerated treatment well Patient left: in bed;with call bell/phone within reach;with bed alarm set Nurse Communication: Mobility status PT Visit Diagnosis: Other abnormalities of gait and mobility (R26.89);Muscle weakness (generalized) (M62.81)     Time: 1610-9604 PT Time Calculation (min) (ACUTE ONLY): 34 min   Charges:   PT Evaluation $PT Eval Low Complexity: 1 Low   PT General Charges $$ ACUTE PT VISIT: 1 Visit         Arlyss Gandy, PT, DPT Acute Rehabilitation Office [phone]   Arlyss Gandy 11/26/2023, 1:21 PM

## 2023-11-26 NOTE — TOC Initial Note (Signed)
 Transition of Care [facility]) - Initial/Assessment Note    Patient Details  Name: [name] [name] MRN: [id number] Date of Birth: [birth year]  [facility] CM/SW Contact:    Eduard Roux, LCSW Phone Number: 11/26/2023, 5:50 PM  Clinical Narrative:                  CSW met with patient at bedside. CSW introduced self and explained role. Patient confirmed she is from [facility] and will return once stable for discharge. She reports her relative will provide transportation to ALF. She states since she ahs family transporting her no need to contact her daughter at discharge. She is agreeable to home health.   RNCM updated.  TOC will continue to follow and assist with discharge planning.  Antony Blackbird, MSW, LCSW Clinical Social Worker    Expected Discharge Plan: Assisted Living Barriers to Discharge: Continued Medical Work up   Patient Goals and CMS Choice            Expected Discharge Plan and Services In-house Referral: Clinical Social Work     Living arrangements for the past 2 months: Assisted Living Facility                                      Prior Living Arrangements/Services Living arrangements for the past 2 months: Assisted Living Facility Lives with:: Self, Spouse Patient language and need for interpreter reviewed:: No        Need for Family Participation in Patient Care: Yes (Comment) Care giver support system in place?: Yes (comment)   Criminal Activity/Legal Involvement Pertinent to Current Situation/Hospitalization: No - Comment as needed  Activities of Daily Living   ADL Screening (condition at time of admission) Independently performs ADLs?: Yes (appropriate for developmental age) Is the patient deaf or have difficulty hearing?: No Does the patient have difficulty seeing, even when wearing glasses/contacts?: No Does the patient have difficulty concentrating, remembering, or making decisions?: No  Permission  Sought/Granted Permission sought to share information with : Family Supports Permission granted to share information with : Yes, Verbal Permission Granted  Share Information with NAME: Rozanna Boer  Permission granted to share info w AGENCY: ALF  Permission granted to share info w Relationship: daughter  Permission granted to share info w Contact Information: [phone]  Emotional Assessment Appearance:: Appears stated age     Orientation: : Oriented to Self, Oriented to Place, Oriented to  Time Alcohol / Substance Use: Not Applicable Psych Involvement: No (comment)  Admission diagnosis:  Femoral artery pseudo-aneurysm, right (HCC) [I72.4] Pseudoaneurysm following procedure (HCC) [W29.562Z, I72.9] Patient Active Problem List   Diagnosis Date Noted   Femoral artery pseudo-aneurysm, right (HCC) 11/24/2023   Leukocytosis 11/24/2023   Normocytic anemia 11/24/2023   Large bowel obstruction (HCC) 07/05/2023   AKI (acute kidney injury) (HCC) 08/15/2021   Acute diverticulitis 08/15/2021   History of DVT (deep vein thrombosis) 08/15/2021   Hypothyroidism 08/15/2021   Sepsis (HCC) 08/14/2021   Colitis 07/18/2016   Depression    Heart transplanted (HCC)    HTN (hypertension)    PCP:  Cleatis Polka., MD Pharmacy:   [facility] [phone] Ginette Otto, [place] - [address] AT [facility] [address] & [facility] [address] [place] [address] Phone: [phone] Fax: [phone]  [facility] [address] [place] [address]  Phone: [phone] Fax: [phone]     Social Drivers of Health (SDOH) Social History: SDOH Screenings   Food Insecurity: No Food Insecurity (11/24/2023)  Housing: Low Risk  (11/24/2023)  Transportation Needs: No Transportation Needs (11/24/2023)  Utilities: Not At Risk (11/24/2023)  Social Connections: Socially Integrated (11/24/2023)  Tobacco Use: Medium Risk (11/25/2023)   SDOH Interventions:      Readmission Risk Interventions     No data to display

## 2023-11-27 DIAGNOSIS — D72823 Leukemoid reaction: Secondary | ICD-10-CM | POA: Diagnosis not present

## 2023-11-27 DIAGNOSIS — I724 Aneurysm of artery of lower extremity: Secondary | ICD-10-CM | POA: Diagnosis not present

## 2023-11-27 DIAGNOSIS — Z941 Heart transplant status: Secondary | ICD-10-CM | POA: Diagnosis not present

## 2023-11-27 DIAGNOSIS — T81718A Complication of other artery following a procedure, not elsewhere classified, initial encounter: Secondary | ICD-10-CM | POA: Diagnosis not present

## 2023-11-27 LAB — BASIC METABOLIC PANEL WITH GFR
Anion gap: 10 (ref 5–15)
BUN: 26 mg/dL — ABNORMAL HIGH (ref 8–23)
CO2: 22 mmol/L (ref 22–32)
Calcium: 9.1 mg/dL (ref 8.9–10.3)
Chloride: 107 mmol/L (ref 98–111)
Creatinine, Ser: 1.41 mg/dL — ABNORMAL HIGH (ref 0.44–1.00)
GFR, Estimated: 37 mL/min — ABNORMAL LOW (ref >60)
Glucose, Bld: 142 mg/dL — ABNORMAL HIGH (ref 70–99)
Potassium: 4.6 mmol/L (ref 3.5–5.1)
Sodium: 139 mmol/L (ref 135–145)

## 2023-11-27 LAB — CBC
HCT: 22.7 % — ABNORMAL LOW (ref 36.0–46.0)
Hemoglobin: 7.5 g/dL — ABNORMAL LOW (ref 12.0–15.0)
MCH: 29.9 pg (ref 26.0–34.0)
MCHC: 33 g/dL (ref 30.0–36.0)
MCV: 90.4 fL (ref 80.0–100.0)
Platelets: 245 10*3/uL (ref 150–400)
RBC: 2.51 MIL/uL — ABNORMAL LOW (ref 3.87–5.11)
RDW: 13.5 % (ref 11.5–15.5)
WBC: 21.9 10*3/uL — ABNORMAL HIGH (ref 4.0–10.5)
nRBC: 0 % (ref 0.0–0.2)

## 2023-11-27 LAB — HEMOGLOBIN AND HEMATOCRIT, BLOOD
HCT: 29.5 % — ABNORMAL LOW (ref 36.0–46.0)
Hemoglobin: 9.8 g/dL — ABNORMAL LOW (ref 12.0–15.0)

## 2023-11-27 LAB — PREPARE RBC (CROSSMATCH)

## 2023-11-27 MED ORDER — BISACODYL 10 MG RE SUPP: 10.0000 mg | Freq: Once | RECTAL | Status: DC

## 2023-11-27 MED ORDER — APIXABAN 5 MG PO TABS
5.0000 mg | ORAL_TABLET | Freq: Two times a day (BID) | ORAL | Status: DC
  Administered 2023-11-28 – 2023-11-29 (×3): 5 mg via ORAL
  Filled 2023-11-27 (×3): qty 1

## 2023-11-27 MED ORDER — POLYETHYLENE GLYCOL 3350 17 G PO PACK
17.0000 g | PACK | Freq: Every day | ORAL | Status: DC
  Administered 2023-11-27 – 2023-11-28 (×2): 17 g via ORAL
  Filled 2023-11-27 (×2): qty 1

## 2023-11-27 MED ORDER — CHLORHEXIDINE GLUCONATE CLOTH 2 % EX PADS
6.0000 | MEDICATED_PAD | Freq: Every day | CUTANEOUS | Status: DC
  Administered 2023-11-27 – 2023-11-29 (×3): 6 via TOPICAL

## 2023-11-27 MED ORDER — MUPIROCIN 2 % EX OINT
1.0000 | TOPICAL_OINTMENT | Freq: Two times a day (BID) | CUTANEOUS | Status: DC
Start: 2023-11-27 — End: 2023-12-02
  Administered 2023-11-27 – 2023-11-29 (×5): 1 via NASAL
  Filled 2023-11-27: qty 22

## 2023-11-27 MED ORDER — SODIUM CHLORIDE 0.9% IV SOLUTION: Freq: Once | INTRAVENOUS | Status: AC

## 2023-11-27 MED ORDER — SENNOSIDES-DOCUSATE SODIUM 8.6-50 MG PO TABS
1.0000 | ORAL_TABLET | Freq: Two times a day (BID) | ORAL | Status: DC
  Administered 2023-11-27 – 2023-11-29 (×5): 1 via ORAL
  Filled 2023-11-27 (×5): qty 1

## 2023-11-27 NOTE — TOC Progression Note (Signed)
 Transition of Care (TOC) - Progression Note  Donn Pierini RN, BSN Transitions of Care Unit 4E- RN Case Manager See Treatment Team for direct phone #   Patient Details  Name: Desia Saban MRN: [id number] Date of Birth: [birth year]  Transition of Care [facility]) CM/SW Contact  Zenda Alpers, Lenn Sink, RN Phone Number: 11/27/2023, 3:31 PM  Clinical Narrative:    CM spoke with patient at bedside for transition needs- HH and DME.  Orders placed for HHPT/OT/aide. DME-BSC  Per conversation pt voiced that she lives at [facility] w/ spouse. Spouse has in-home care w/ [facility] through long-term care policy.  Choice offered to pt for [facility] PT/OT/aide (bath) w/ list provided Per CMS guidelines from [employer] website with star ratings (copy placed in shadow chart), pt also has on site therapy with [facility] that she could use but they do not offer bath aide.  Pt voiced she had [facility] back in Nov. However they "never sent an aide" not clear if that was part of the orders or if pt just thought she was getting an aide. Explained to pt that [facility] aides through skilled agencies do not come daily and only assist with bathing/showering a couple times per week.  Pt voiced that she needs an aide- discussed using her LT care insurance to see about getting an aide for her as well like her spouse has w/ [facility]- pt would need to follow up on that and see if she wants to do that.   After review of list pt decided she wanted to try [facility] for her therapy needs and see if they also have bath aide.  She also wants to check on [facility]- has she thinks she may have one already or can borrow one.   Call made to [facility] liaison- confirmed bath aide is available for [employer]- referral has been accepted for PT/OT/aide needs.   TOC will f/u with pt tomorrow regarding BSC.    Expected Discharge Plan: Assisted Living Barriers to Discharge: Continued Medical Work up  Expected Discharge Plan and  Services In-house Referral: Clinical Social Work Discharge Planning Services: CM Consult Post Acute Care Choice: Durable Medical Equipment, Home Health Living arrangements for the past 2 months: Independent Living Facility                 DME Arranged: Bedside commode         HH Arranged: PT, OT, Nurse's Aide HH Agency: [facility] Date HH Agency Contacted: 11/27/23 Time HH Agency Contacted: 1450 Representative spoke with at [facility] Agency: Haywood Lasso   Social Determinants of Health (SDOH) Interventions SDOH Screenings   Food Insecurity: No Food Insecurity (11/24/2023)  Housing: Low Risk  (11/24/2023)  Transportation Needs: No Transportation Needs (11/24/2023)  Utilities: Not At Risk (11/24/2023)  Social Connections: Socially Integrated (11/24/2023)  Tobacco Use: Medium Risk (11/25/2023)    Readmission Risk Interventions     No data to display

## 2023-11-27 NOTE — Progress Notes (Signed)
 Physical Therapy Treatment Patient Details Name: [name] [name] MRN: [id number] DOB: 26-Jun-1941 Today's Date: 11/27/2023   History of Present Illness 83 y.o. female presents to [facility] hospital on 11/24/2023 with R groin swelling and pain after heart cath on 3/28. Ultrasound revealed R femoral artery pseudoaneurysm. Pt underwent pseudoaneurysm repair on 3/30. PMH includes heart transplant 2009, afib, DVT, hypothyroidism, HTN, anxiety, depression, GERD, osteoporosis, OSA.    PT Comments  Pt resting in bed on arrival, blood transfusion running and finishing during session. Pt agreeable to sitting up EOB for snack. Pt able to come to sitting EOB with supervision and increased time with use of bed features. Pt agreeable for ambulation/gait training next date and verbalizing understanding on education of importance of frequent mobility and time up OOB. Pt continues to benefit from skilled PT services to progress toward functional mobility goals.     If plan is discharge home, recommend the following: A little help with bathing/dressing/bathroom;Assistance with cooking/housework;Assist for transportation;Help with stairs or ramp for entrance   Can travel by private vehicle        Equipment Recommendations  BSC/3in1    Recommendations for Other Services       Precautions / Restrictions Precautions Precautions: Fall Recall of Precautions/Restrictions: Intact Precaution/Restrictions Comments: R groin hematoma Restrictions Weight Bearing Restrictions Per Provider Order: No     Mobility  Bed Mobility Overal bed mobility: Needs Assistance Bed Mobility: Supine to Sit, Sit to Supine     Supine to sit: Supervision     General bed mobility comments: supervision for safety with HOB elevated and rail use    Transfers Overall transfer level: Needs assistance                 General transfer comment: deferred as blood running    Ambulation/Gait                   Stairs              Wheelchair Mobility     Tilt Bed    Modified Rankin (Stroke Patients Only)       Balance Overall balance assessment: Needs assistance Sitting-balance support: No upper extremity supported, Feet supported Sitting balance-Leahy Scale: Good Sitting balance - Comments: EOB                                    Communication Communication Communication: No apparent difficulties  Cognition Arousal: Alert Behavior During Therapy: WFL for tasks assessed/performed                             Following commands: Intact      Cueing Cueing Techniques: Verbal cues  Exercises General Exercises - Lower Extremity Long Arc Quad: AROM, Both, 5 reps    General Comments General comments (skin integrity, edema, etc.): VSS on RA, blood running      Pertinent Vitals/Pain Pain Assessment Pain Assessment: Faces Faces Pain Scale: Hurts little more Pain Location: R groin Pain Descriptors / Indicators: Sore    Home Living                          Prior Function            PT Goals (current goals can now be found in the care plan section) Acute Rehab PT Goals PT Goal Formulation:  With patient Time For Goal Achievement: 12/10/23 Progress towards PT goals: Progressing toward goals    Frequency    Min 3X/week      PT Plan      Co-evaluation              AM-PAC PT "6 Clicks" Mobility   Outcome Measure  Help needed turning from your back to your side while in a flat bed without using bedrails?: A Little Help needed moving from lying on your back to sitting on the side of a flat bed without using bedrails?: A Little Help needed moving to and from a bed to a chair (including a wheelchair)?: A Little Help needed standing up from a chair using your arms (e.g., wheelchair or bedside chair)?: A Little Help needed to walk in hospital room?: A Little Help needed climbing 3-5 steps with a railing? : A Lot 6 Click Score:  17    End of Session   Activity Tolerance: Patient tolerated treatment well Patient left: in bed;with call bell/phone within reach;with nursing/sitter in room (seated up EOB) Nurse Communication: Mobility status PT Visit Diagnosis: Other abnormalities of gait and mobility (R26.89);Muscle weakness (generalized) (M62.81)     Time: 0272-5366 PT Time Calculation (min) (ACUTE ONLY): 18 min  Charges:    $Therapeutic Activity: 8-22 mins PT General Charges $$ ACUTE PT VISIT: 1 Visit                     Genova Kiner R. PTA Acute Rehabilitation Services Office: [phone]   Catalina Antigua 11/27/2023, 4:36 PM

## 2023-11-27 NOTE — Progress Notes (Signed)
 Occupational Therapy Treatment Patient Details Name: [name] [name] MRN: [id number] DOB: 05-15-1941 Today's Date: 11/27/2023   History of present illness 83 y.o. female presents to [facility] hospital on 11/24/2023 with R groin swelling and pain after heart cath on 3/28. Ultrasound revealed R femoral artery pseudoaneurysm. Pt underwent pseudoaneurysm repair on 3/30. PMH includes heart transplant 2009, afib, DVT, hypothyroidism, HTN, anxiety, depression, GERD, osteoporosis, OSA.   OT comments  Pt received in supine, pleasant and agreeable to therapy. Pt reported not feeling well and stated that they hoped to feel better later this date. Pt able to get to EOB with supervision and stand using RW. Once in standing pt able to ambulate to sink with supervision and min CGA as deemed appropriate. Pt completed self care and grooming tasks at sink in standing with supervision, using RW for support. Pt requesting to get back in bed following self care due to fatigue/not feeling well. Pt able to complete sit<>supine under supervision. Left in supine with all needs met. Acute OT to continue to follow to address established goals to facilitate DC to next venue of care.       If plan is discharge home, recommend the following:  Assistance with cooking/housework;Assist for transportation   Equipment Recommendations  BSC/3in1    Recommendations for Other Services      Precautions / Restrictions Precautions Precautions: Fall Recall of Precautions/Restrictions: Intact Precaution/Restrictions Comments: R groin hematoma Restrictions Weight Bearing Restrictions Per Provider Order: No       Mobility Bed Mobility Overal bed mobility: Needs Assistance Bed Mobility: Supine to Sit, Sit to Supine     Supine to sit: Supervision Sit to supine: Supervision   General bed mobility comments: completed under supervision    Transfers Overall transfer level: Needs assistance Equipment used: Rolling walker (2  wheels) Transfers: Sit to/from Stand Sit to Stand: Supervision           General transfer comment: from EOB to standing with RW     Balance Overall balance assessment: Needs assistance Sitting-balance support: No upper extremity supported, Feet supported Sitting balance-Leahy Scale: Good Sitting balance - Comments: EOB   Standing balance support: Single extremity supported, Bilateral upper extremity supported, During functional activity, Reliant on assistive device for balance Standing balance-Leahy Scale: Fair Standing balance comment: at sink performing functional tasks                           ADL either performed or assessed with clinical judgement   ADL Overall ADL's : Needs assistance/impaired     Grooming: Wash/dry hands;Wash/dry face;Oral care;Brushing hair;Supervision/safety;Standing Grooming Details (indicate cue type and reason): at sink, no LOB noted, tolerance to standing limited due to fatigue                             Functional mobility during ADLs: Contact guard assist;Supervision/safety;Rolling walker (2 wheels) General ADL Comments: completed at sink in standing, good safety awareness however limited by fatigue and required rest break before returning to supine    Extremity/Trunk Assessment              Vision       Perception     Praxis     Communication Communication Communication: No apparent difficulties   Cognition Arousal: Alert Behavior During Therapy: WFL for tasks assessed/performed Cognition: No apparent impairments  Following commands: Intact        Cueing   Cueing Techniques: Verbal cues  Exercises      Shoulder Instructions       General Comments      Pertinent Vitals/ Pain       Pain Assessment Pain Assessment: Faces Faces Pain Scale: Hurts little more Pain Location: R groin Pain Descriptors / Indicators: Sore Pain Intervention(s): Monitored  during session, Limited activity within patient's tolerance  Home Living                                          Prior Functioning/Environment              Frequency  Min 2X/week        Progress Toward Goals  OT Goals(current goals can now be found in the care plan section)     Acute Rehab OT Goals OT Goal Formulation: With patient Time For Goal Achievement: 12/10/23 Potential to Achieve Goals: Good ADL Goals Pt Will Perform Lower Body Dressing: with modified independence;sit to/from stand;sitting/lateral leans;with adaptive equipment Pt Will Transfer to Toilet: with modified independence;ambulating Additional ADL Goal #1: Pt to complete bed mobility with Modified Independence as precursor to OOB ADLs/mobility  Plan      Co-evaluation                 AM-PAC OT "6 Clicks" Daily Activity     Outcome Measure   Help from another person eating meals?: None Help from another person taking care of personal grooming?: A Little Help from another person toileting, which includes using toliet, bedpan, or urinal?: A Little Help from another person bathing (including washing, rinsing, drying)?: A Little Help from another person to put on and taking off regular upper body clothing?: A Little Help from another person to put on and taking off regular lower body clothing?: A Little 6 Click Score: 19    End of Session Equipment Utilized During Treatment: Rolling walker (2 wheels)  OT Visit Diagnosis: Unsteadiness on feet (R26.81);Pain Pain - Right/Left: Right Pain - part of body: Hip   Activity Tolerance Patient tolerated treatment well   Patient Left in bed;with call bell/phone within reach;with bed alarm set   Nurse Communication Mobility status        Time: 1610-9604 OT Time Calculation (min): 21 min  Charges: OT General Charges $OT Visit: 1 Visit OT Treatments $Self Care/Home Management : 8-22 mins  Rosland Riding, BS,  OTA/S   Julita Ozbun 11/27/2023, 12:44 PM

## 2023-11-27 NOTE — Progress Notes (Signed)
 PT Cancellation Note  Patient Details Name: [name] [name] MRN: [id number] DOB: May 17, 1941   Cancelled Treatment:    Reason Eval/Treat Not Completed: (P) Other (comment), per COTA, pt asking this PTA to return in later afternoon after receiving blood. Will check back as schedule allows to continue with PT POC.  [name]. PTA Acute Rehabilitation Services Office: [phone]    Catalina Antigua 11/27/2023, 2:19 PM

## 2023-11-27 NOTE — Progress Notes (Signed)
 [facility]                                                                              [name] [name], is a 83 y.o. 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  Patient had undergone diagnostic heart cath at [facility] on 3/28.  Next day, she noticed swelling, pain and purplish discoloration in the right groin area.  No numbness tingling or weakness in the thigh or lower leg.   Ultrasound showed 6.5 cmx 4.4 cm area arising off of the CFA with corrector sticks of a partially thrombosed pseudoaneurysm, neck measures approximately 1 cmx 1.2 cm.  Vascular surgery was consulted, patient was admitted for further workup.  Assessment & Plan    Principal Problem:   Femoral artery pseudo-aneurysm, right (HCC) -Status post or diagnostic heart cath on 3/28 at [facility].  Now presented with right groin pseudoaneurysm -Vascular surgery consulted - S/P Right femoral artery pseudoaneurysm repair and evacuation of hematoma right thigh on 3/30, postop day #2 -Hemoglobin 7.5 today, 11.6 on admission -Will transfuse 1 unit packed RBCs today -Resume Eliquis in a.m.  Active Problems: Leukocytosis -Likely reactive, no infectious signs or symptoms, stable no fevers  -Improving, procalcitonin less than 0.1  Normocytic anemia -Baseline 12 on 07/14/2023.   -H&H currently stable, 9.6, follow closely, no bleeding  Acute kidney injury -Creatinine 1.0 on admission, 1.4 today, likely worsened due to anemia -Continue to hold losartan -Transfuse 1 unit  packed RBCs, obtain BMET in am   History of heart transplant in 2009 -Mild nonobstructive CAD seen on diagnostic heart cath on 3/28 -Resume aspirin, eliquis in a.m. -Continue CellCept and Prograf per pharmacy    Hypertension Continue Cardizem.  Resume Cozaar after surgery if BP elevated IV labetalol as needed  OSA -Not on CPAP  History of DVT in 2018 -On Eliquis, currently on hold  Obesity class I Estimated body mass index is 31.34 kg/m as calculated from the following:   Height as of this encounter: 5' 2.01" (1.575 m).   Weight as of this encounter: 77.7 kg.  Code Status: DNR DVT Prophylaxis:  SCDs Start: 11/24/23 2007 apixaban (ELIQUIS) tablet 5 mg   Level of Care: Level of care: Telemetry Cardiac Family Communication:  Disposition Plan:      Remains inpatient appropriate:   Follow PT evaluation, likely DC in a.m. Procedures:  S/P Right femoral artery pseudoaneurysm repair and evacuation of hematoma right thigh   Consultants:   Vascular surgery  Antimicrobials:   Anti-infectives (From admission, onward)    None          Medications  [START ON 11/28/2023] apixaban  5 mg Oral BID  bisacodyl  10 mg Rectal Once   Chlorhexidine Gluconate Cloth  6 each Topical Daily   diltiazem  120 mg Oral BID   famotidine  20 mg Oral BID   levothyroxine  50 mcg Oral QAC breakfast   mupirocin ointment  1 Application Nasal BID   mycophenolate  250 mg Oral BID   pantoprazole  40 mg Oral Daily   polyethylene glycol  17 g Oral Daily   senna-docusate  1 tablet Oral BID   tacrolimus  0.5 mg Oral BID   venlafaxine XR  37.5 mg Oral Q breakfast      Subjective:   [name] [name] was seen and examined today.  Sitting up, no acute complaints just feels very weak.  No nausea vomiting, diarrhea, hematochezia or melena.  Right groin site soft, still has significant ecchymosis.    Objective:   Vitals:   11/27/23 0735 11/27/23 0755 11/27/23 1130 11/27/23 1247  BP: 137/84 137/84 (!)  148/57 117/66  Pulse: 99 99 94 81  Resp: 18 18 20 20   Temp: 97.6 F (36.4 C) 97.6 F (36.4 C) 97.8 F (36.6 C) 97.8 F (36.6 C)  TempSrc: Oral  Oral Oral  SpO2: 95% 95% 98% 98%  Weight:      Height:        Intake/Output Summary (Last 24 hours) at 11/27/2023 1306 Last data filed at 11/27/2023 0900 Gross per 24 hour  Intake 240 ml  Output --  Net 240 ml     Wt Readings from Last 3 Encounters:  11/25/23 77.7 kg  07/13/23 75.7 kg  02/16/23 82.1 kg   Physical Exam General: Alert and oriented x 3, NAD Cardiovascular: S1 S2 clear, RRR.  Respiratory: CTAB, no wheezing Gastrointestinal: Soft, nontender, nondistended, NBS Ext: no pedal edema bilaterally Neuro: no new deficits Skin: Right groin area with ecchymosis, soft Psych: Normal affect    Data Reviewed:  I have personally reviewed following labs    CBC Lab Results  Component Value Date   WBC 21.9 (H) 11/27/2023   RBC 2.51 (L) 11/27/2023   HGB 7.5 (L) 11/27/2023   HCT 22.7 (L) 11/27/2023   MCV 90.4 11/27/2023   MCH 29.9 11/27/2023   PLT 245 11/27/2023   MCHC 33.0 11/27/2023   RDW 13.5 11/27/2023   LYMPHSABS 2.0 11/24/2023   MONOABS 1.4 (H) 11/24/2023   EOSABS 0.2 11/24/2023   BASOSABS 0.1 11/24/2023     Last metabolic panel Lab Results  Component Value Date   NA 139 11/27/2023   K 4.6 11/27/2023   CL 107 11/27/2023   CO2 22 11/27/2023   BUN 26 (H) 11/27/2023   CREATININE 1.41 (H) 11/27/2023   GLUCOSE 142 (H) 11/27/2023   GFRNONAA 37 (L) 11/27/2023   GFRAA >60 01/30/2017   CALCIUM 9.1 11/27/2023   PHOS 3.1 07/06/2023   PROT 5.9 (L) 11/24/2023   ALBUMIN 2.9 (L) 11/24/2023   BILITOT 0.7 11/24/2023   ALKPHOS 73 11/24/2023   AST 20 11/24/2023   ALT 15 11/24/2023   ANIONGAP 10 11/27/2023    CBG (last 3)  No results for input(s): "GLUCAP" in the last 72 hours.    Coagulation Profile: No results for input(s): "INR", "PROTIME" in the last 168 hours.   Radiology Studies: I have personally  reviewed the imaging studies  No results found.      Thad Ranger M.D. [facility] 11/27/2023, 1:06 PM  Available via Epic secure chat 7am-7pm After 7 pm, please refer to night coverage provider  listed on amion.

## 2023-11-27 NOTE — Progress Notes (Addendum)
 [facility]  Subjective  - Doing better over all.     Objective (!) 118/49 73 97.8 F (36.6 C) (Oral) 17 93%  Intake/Output Summary (Last 24 hours) at 11/27/2023 0701 Last data filed at 11/26/2023 1000 Gross per 24 hour  Intake 240 ml  Output 300 ml  Net -60 ml    Right groin ecchymosis spreading distally down the thigh Soft right groin without hematoma  General seems a little anxious    Assessment/Planning: POD # 2  Right femoral Pseudoaneurysm post cardiac cath 11/23/23 S/P Right femoral artery pseudoaneurysm repair and evacuation of hematoma right thigh  Right femoral Pseudoaneurysm post cardiac cath 11/23/23 S/P Right femoral artery pseudoaneurysm repair and evacuation of hematoma right thigh  PT/OT eval recommend A little help with bathing/dressing/bathroom;Assistance with cooking/housework;Assist for transportation;Help with stairs or ramp for entrance   BSC/3in1 ordered and home health orders placed on chart F/U in 2-3 weeks for incision check  OK to restart Eliquis today Stable from a vascular point of view for discharge   Mosetta Pigeon 11/27/2023 7:01 AM --  Laboratory Lab Results: Recent Labs    11/26/23 0941 11/27/23 0455  WBC 28.0* 21.9*  HGB 9.6* 7.5*  HCT 28.8* 22.7*  PLT 256 245   BMET Recent Labs    11/26/23 0941 11/27/23 0455  NA 136 139  K 4.3 4.6  CL 107 107  CO2 19* 22  GLUCOSE 170* 142*  BUN 17 26*  CREATININE 1.24* 1.41*  CALCIUM 9.1 9.1    COAG No results found for: "INR", "PROTIME" No results found for: "PTT"

## 2023-11-27 NOTE — Progress Notes (Signed)
 Pt would like for MD to notify heart transplant team Dr. Dot Lanes at [facility] that she will be receiving 1 unit RBCs today. Contact number for Dot Lanes [phone], provided by patient. MD notified.  Kenard Gower, RN

## 2023-11-28 ENCOUNTER — Inpatient Hospital Stay (HOSPITAL_COMMUNITY)

## 2023-11-28 DIAGNOSIS — K5901 Slow transit constipation: Secondary | ICD-10-CM | POA: Diagnosis not present

## 2023-11-28 DIAGNOSIS — I724 Aneurysm of artery of lower extremity: Secondary | ICD-10-CM | POA: Diagnosis not present

## 2023-11-28 DIAGNOSIS — D62 Acute posthemorrhagic anemia: Secondary | ICD-10-CM

## 2023-11-28 DIAGNOSIS — N179 Acute kidney failure, unspecified: Secondary | ICD-10-CM

## 2023-11-28 LAB — BASIC METABOLIC PANEL WITH GFR
Anion gap: 8 (ref 5–15)
BUN: 23 mg/dL (ref 8–23)
CO2: 24 mmol/L (ref 22–32)
Calcium: 9.1 mg/dL (ref 8.9–10.3)
Chloride: 106 mmol/L (ref 98–111)
Creatinine, Ser: 1.18 mg/dL — ABNORMAL HIGH (ref 0.44–1.00)
GFR, Estimated: 46 mL/min — ABNORMAL LOW (ref >60)
Glucose, Bld: 141 mg/dL — ABNORMAL HIGH (ref 70–99)
Potassium: 4 mmol/L (ref 3.5–5.1)
Sodium: 138 mmol/L (ref 135–145)

## 2023-11-28 LAB — CBC
HCT: 27.5 % — ABNORMAL LOW (ref 36.0–46.0)
Hemoglobin: 9.1 g/dL — ABNORMAL LOW (ref 12.0–15.0)
MCH: 29.3 pg (ref 26.0–34.0)
MCHC: 33.1 g/dL (ref 30.0–36.0)
MCV: 88.4 fL (ref 80.0–100.0)
Platelets: 278 10*3/uL (ref 150–400)
RBC: 3.11 MIL/uL — ABNORMAL LOW (ref 3.87–5.11)
RDW: 16.4 % — ABNORMAL HIGH (ref 11.5–15.5)
WBC: 18.1 10*3/uL — ABNORMAL HIGH (ref 4.0–10.5)
nRBC: 0.4 % — ABNORMAL HIGH (ref 0.0–0.2)

## 2023-11-28 LAB — BPAM RBC
Blood Product Expiration Date: 202504082359
ISSUE DATE / TIME: 202504011126
Unit Type and Rh: 5100

## 2023-11-28 LAB — TYPE AND SCREEN
ABO/RH(D): O POS
Antibody Screen: NEGATIVE
Unit division: 0

## 2023-11-28 MED ORDER — BISACODYL 10 MG RE SUPP
10.0000 mg | Freq: Once | RECTAL | Status: AC
  Administered 2023-11-28: 10 mg via RECTAL
  Filled 2023-11-28: qty 1

## 2023-11-28 MED ORDER — ASPIRIN 81 MG PO TBEC
81.0000 mg | DELAYED_RELEASE_TABLET | Freq: Every day | ORAL | Status: DC
  Administered 2023-11-28 – 2023-11-29 (×2): 81 mg via ORAL
  Filled 2023-11-28 (×2): qty 1

## 2023-11-28 MED ORDER — POLYETHYLENE GLYCOL 3350 17 G PO PACK
17.0000 g | PACK | Freq: Two times a day (BID) | ORAL | Status: DC
  Filled 2023-11-28 (×2): qty 1

## 2023-11-28 MED ORDER — MILK AND MOLASSES ENEMA
1.0000 | Freq: Once | RECTAL | Status: AC
  Administered 2023-11-28: 240 mL via RECTAL
  Filled 2023-11-28: qty 240

## 2023-11-28 NOTE — Progress Notes (Signed)
 PROGRESS NOTE   [name] [name]  ZOX:[id number]    DOB: 24-Sep-1940    DOA: 11/24/2023  PCP: Cleatis Polka., MD   I have briefly reviewed patients previous medical records in [facility].  Chief Complaint  Patient presents with   post procedure swelling    Brief Hospital Course:  83 year old female with a history of heart transplant in 2009, DVT in 2018 on Eliquis , hypothyroidism, HTN, anxiety, depression, parathyroidectomy, GERD, osteoporosis, OSA on not on CPAP, presented with right groin swelling and pain.  Patient had undergone diagnostic heart cath at [facility] on 3/28.  Next day, she noticed swelling, pain and purplish discoloration in the right groin area.  No numbness tingling or weakness in the thigh or lower leg.   Ultrasound showed 6.5 cmx 4.4 cm area arising off of the CFA with corrector sticks of a partially thrombosed pseudoaneurysm, neck measures approximately 1 cmx 1.2 cm.   Vascular surgery was consulted, patient was admitted for further workup.   Assessment & Plan:  Principal Problem:   Femoral artery pseudo-aneurysm, right (HCC) Active Problems:   Heart transplanted (HCC)   HTN (hypertension)   Leukocytosis   Normocytic anemia   Principal Problem:   Femoral artery pseudo-aneurysm, right (HCC) -Developed status post diagnostic heart cath on 3/28 at [facility].  Now presented with right groin pseudoaneurysm -Vascular surgery consulted - S/P Right femoral artery pseudoaneurysm repair and evacuation of hematoma right thigh on 3/30, postop day #3 -Hemoglobin was 11.6 on admission which dropped to 7.5 on 4/1, posttransfusion, hemoglobin up to 9.8 > 9.1.  Follow CBC in AM -No overt bleeding.  Resumed prior home dose of Eliquis and aspirin.   Active Problems: Leukocytosis -Likely reactive, no infectious signs or symptoms, stable no fevers  -Improving, procalcitonin less than 0.1 -Peak leukocytosis of 28K, down to 18.  Follow CBC in AM.   Acute  blood loss anemia Management as noted above.   Acute kidney injury -Creatinine 1.0 on admission, 1.4 today, likely worsened due to anemia -Continue to hold losartan -Creatinine has improved from 1.41-1.18.  Some of this could have been due to contrast during procedure.  Avoid nephrotoxins.  Follow BMP in AM.   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  As per patient's report, she has been on aspirin for 5+ years and Eliquis for 2+ years managed by her transplant team. -Continue CellCept and Prograf per pharmacy    Hypertension Continue Cardizem.  Holding Cozaar due to recent AKI. IV labetalol as needed   OSA -Not on CPAP   History of DVT in 2018 -Resume Eliquis on 4/2.  Constipation Patient reports that she last had a normal BM 6 days PTA.  Had a tiny BM last night and passing flatus.  However patient expresses extreme concern regarding her constipation.  Reportedly in the past she had to have colonoscopy and disimpaction by [facility].  Continue MiraLAX and senna.  No improvement with Dulcolax suppository.  Trial of enema.  Body mass index is 31.34 kg/m.   DVT prophylaxis: SCDs Start: 11/24/23 2007     Code Status: Do not attempt resuscitation (DNR) PRE-ARREST INTERVENTIONS DESIRED:  Family Communication: None at bedside Disposition:  Status is: Inpatient Remains inpatient appropriate because: Pending improvement in refractory constipation.     Consultants:   Vascular surgery  Procedures:   As noted above  Antimicrobials:      Subjective:  Constipation history as noted above.  Patient quite concerned.  Some appropriate right groin pain and wants to try Tylenol rather than opioids which could worsen her constipation.  Objective:   Vitals:   11/28/23 0424 11/28/23 0724 11/28/23 1335 11/28/23 1550  BP: (!) 132/44 (!) 160/70 (!) 147/58 93/61  Pulse:  91    Resp: 20 18 11 18    Temp: 98.3 F (36.8 C) 97.8 F (36.6 C)    TempSrc: Oral Oral    SpO2:   100%   Weight:      Height:        General exam: Elderly female, moderately built and nourished sitting up comfortably in reclining chair without distress. Respiratory system: Clear to auscultation. Respiratory effort normal. Cardiovascular system: S1 & S2 heard, RRR. No JVD, murmurs, rubs, gallops or clicks. No pedal edema.  Telemetry personally reviewed: Sinus rhythm. Gastrointestinal system: Abdomen is nondistended, soft and nontender. No organomegaly or masses felt. Normal bowel sounds heard. Central nervous system: Alert and oriented. No focal neurological deficits. Extremities: Symmetric 5 x 5 power. Skin: No rashes, lesions or ulcers.  Large area of ecchymosis right groin area. Psychiatry: Judgement and insight appear normal. Mood & affect appropriate.     Data Reviewed:   I have personally reviewed following labs and imaging studies   CBC: Recent Labs  Lab 11/24/23 1755 11/25/23 0343 11/26/23 0941 11/27/23 0455 11/27/23 1724 11/28/23 0349  WBC 14.0*   < > 28.0* 21.9*  --  18.1*  NEUTROABS 10.3*  --   --   --   --   --   HGB 11.6*   < > 9.6* 7.5* 9.8* 9.1*  HCT 34.5*   < > 28.8* 22.7* 29.5* 27.5*  MCV 90.6   < > 91.1 90.4  --  88.4  PLT 251   < > 256 245  --  278   < > = values in this interval not displayed.    Basic Metabolic Panel: Recent Labs  Lab 11/24/23 1755 11/25/23 0343 11/26/23 0941 11/27/23 0455 11/28/23 0349  NA 136 136 136 139 138  K 3.7 4.0 4.3 4.6 4.0  CL 109 108 107 107 106  CO2 19* 22 19* 22 24  GLUCOSE 107* 127* 170* 142* 141*  BUN 14 12 17  26* 23  CREATININE 1.00 1.00 1.24* 1.41* 1.18*  CALCIUM 8.5* 8.6* 9.1 9.1 9.1  MG  --  1.8  --   --   --     Liver Function Tests: Recent Labs  Lab 11/24/23 1755  AST 20  ALT 15  ALKPHOS 73  BILITOT 0.7  PROT 5.9*  ALBUMIN 2.9*    CBG: No results for input(s): "GLUCAP" in the last 168 hours.  Microbiology  Studies:   Recent Results (from the past 240 hours)  Surgical pcr screen     Status: Abnormal   Collection Time: 11/25/23  6:14 AM   Specimen: Nasal Mucosa; Nasal Swab  Result Value Ref Range Status   MRSA, PCR NEGATIVE NEGATIVE Final   Staphylococcus aureus POSITIVE (A) NEGATIVE Final    Comment: (NOTE) The Xpert SA Assay (FDA approved for NASAL specimens in patients 50 years of age and older), is one component of a comprehensive surveillance program. It is not intended to diagnose infection nor to guide or monitor treatment. Performed at [facility], [address], [place], [place] [address]   Culture, blood (Routine X 2) w Reflex to ID Panel  Status: None (Preliminary result)   Collection Time: 11/26/23  2:22 PM   Specimen: BLOOD LEFT ARM  Result Value Ref Range Status   Specimen Description BLOOD LEFT ARM  Final   Special Requests   Final    BOTTLES DRAWN AEROBIC AND ANAEROBIC Blood Culture results may not be optimal due to an inadequate volume of blood received in culture bottles   Culture   Final    NO GROWTH 2 DAYS Performed at [facility], [address], [place], [place] [address]    Report Status PENDING  Incomplete  Culture, blood (Routine X 2) w Reflex to ID Panel     Status: None (Preliminary result)   Collection Time: 11/26/23  2:28 PM   Specimen: BLOOD  Result Value Ref Range Status   Specimen Description BLOOD SITE NOT SPECIFIED  Final   Special Requests   Final    BOTTLES DRAWN AEROBIC AND ANAEROBIC Blood Culture adequate volume   Culture   Final    NO GROWTH 2 DAYS Performed at [facility], [address], [place], [place] [address]    Report Status PENDING  Incomplete    Radiology Studies:  No results found.  Scheduled Meds:    apixaban  5 mg Oral BID   aspirin EC  81 mg Oral Daily   Chlorhexidine Gluconate Cloth  6 each Topical Daily   diltiazem  120 mg Oral BID   famotidine  20 mg Oral BID   levothyroxine  50 mcg Oral  QAC breakfast   mupirocin ointment  1 Application Nasal BID   mycophenolate  250 mg Oral BID   pantoprazole  40 mg Oral Daily   polyethylene glycol  17 g Oral Daily   senna-docusate  1 tablet Oral BID   tacrolimus  0.5 mg Oral BID   venlafaxine XR  37.5 mg Oral Q breakfast    Continuous Infusions:     LOS: 4 days     Marcellus Scott, MD,  FACP, [facility], [facility], [facility]   [facility] [place]      To contact the attending provider between 7A-7P or the covering provider during after hours 7P-7A, please log into the web site [email or web address] and access using universal [place] password for that web site. If you do not have the password, please call the hospital operator.  11/28/2023, 4:31 PM

## 2023-11-28 NOTE — Care Management Important Message (Signed)
 Important Message  Patient Details  Name: [name] [name] MRN: [id number] Date of Birth: 10/14/1940   Important Message Given:  Yes - Medicare IM     Renie Ora 11/28/2023, 10:29 AM

## 2023-11-28 NOTE — TOC Progression Note (Signed)
 Transition of Care (TOC) - Progression Note  Donn Pierini RN, BSN Transitions of Care Unit 4E- RN Case Manager See Treatment Team for direct phone #   Patient Details  Name: [name] [name] MRN: [id number] Date of Birth: April 28, 1941  Transition of Care [facility]) CM/SW Contact  Zenda Alpers, Lenn Sink, RN Phone Number: 11/28/2023, 3:56 PM  Clinical Narrative:    Spoke with pt at bedside to follow up on DME need- pt voiced she has decided she will not need [facility] for discharge as her bathroom is close and feels she will be able to get to it with her RW.   No other DME need identified.   HH has been set up w/ [facility]- pt aware and agreeable.    Expected Discharge Plan: Assisted Living Barriers to Discharge: Continued Medical Work up  Expected Discharge Plan and Services In-house Referral: Clinical Social Work Discharge Planning Services: CM Consult Post Acute Care Choice: Durable Medical Equipment, Home Health Living arrangements for the past 2 months: Independent Living Facility                 DME Arranged: Bedside commode         HH Arranged: PT, OT, Nurse's Aide HH Agency: [facility] Date HH Agency Contacted: 11/27/23 Time HH Agency Contacted: 1450 Representative spoke with at [facility] Agency: Haywood Lasso   Social Determinants of Health (SDOH) Interventions SDOH Screenings   Food Insecurity: No Food Insecurity (11/24/2023)  Housing: Low Risk  (11/24/2023)  Transportation Needs: No Transportation Needs (11/24/2023)  Utilities: Not At Risk (11/24/2023)  Social Connections: Socially Integrated (11/24/2023)  Tobacco Use: Medium Risk (11/25/2023)    Readmission Risk Interventions     No data to display

## 2023-11-28 NOTE — Plan of Care (Signed)

## 2023-11-28 NOTE — Progress Notes (Signed)
 Physical Therapy Treatment Patient Details Name: [name] [name] MRN: [id number] DOB: October 24, 1940 Today's Date: 11/28/2023   History of Present Illness 83 y.o. female presents to [facility] hospital on 11/24/2023 with R groin swelling and pain after heart cath on 3/28. Ultrasound revealed R femoral artery pseudoaneurysm. Pt underwent pseudoaneurysm repair on 3/30. PMH includes heart transplant 2009, afib, DVT, hypothyroidism, HTN, anxiety, depression, GERD, osteoporosis, OSA.    PT Comments  Pt resting in bed on arrival and agreeable to session with good progress towards acute goals. Pt continues to be limited by RLE pain, limiting standing and activity tolerance. Pt demonstrating bed mobility, transfers and gait with RW for support with grossly supervision for safety with no overt LOB noted. Pt was educated on continued walker use to maximize functional independence, safety, and decrease risk for falls as well as appropriate activity progression with pt verbalizing understanding. HR 95-111bpm with activity. Pt continues to benefit from skilled PT services to progress toward functional mobility goals.      If plan is discharge home, recommend the following: A little help with bathing/dressing/bathroom;Assistance with cooking/housework;Assist for transportation;Help with stairs or ramp for entrance   Can travel by private vehicle        Equipment Recommendations  BSC/3in1    Recommendations for Other Services       Precautions / Restrictions Precautions Precautions: Fall Recall of Precautions/Restrictions: Intact Precaution/Restrictions Comments: R groin hematoma Restrictions Weight Bearing Restrictions Per Provider Order: No     Mobility  Bed Mobility Overal bed mobility: Needs Assistance Bed Mobility: Supine to Sit     Supine to sit: Supervision     General bed mobility comments: supervision for safety with HOB elevated and rail use    Transfers Overall transfer level: Needs  assistance Equipment used: Rolling walker (2 wheels) Transfers: Sit to/from Stand Sit to Stand: Supervision           General transfer comment: supervision from EOB and commode    Ambulation/Gait Ambulation/Gait assistance: Supervision Gait Distance (Feet): 240 Feet Assistive device: Rolling walker (2 wheels) Gait Pattern/deviations: Step-through pattern Gait velocity: reduced     General Gait Details: slowed and gaurded step-through gait, reduced stride length, no LOB   Stairs             Wheelchair Mobility     Tilt Bed    Modified Rankin (Stroke Patients Only)       Balance Overall balance assessment: Needs assistance Sitting-balance support: No upper extremity supported, Feet supported Sitting balance-Leahy Scale: Good Sitting balance - Comments: EOB   Standing balance support: Single extremity supported, Bilateral upper extremity supported, During functional activity, Reliant on assistive device for balance Standing balance-Leahy Scale: Fair Standing balance comment: at sink performing functional tasks without UE support                            Communication Communication Communication: No apparent difficulties  Cognition Arousal: Alert Behavior During Therapy: WFL for tasks assessed/performed   PT - Cognitive impairments: No apparent impairments                         Following commands: Intact      Cueing Cueing Techniques: Verbal cues  Exercises      General Comments General comments (skin integrity, edema, etc.): VSS on RA      Pertinent Vitals/Pain Pain Assessment Pain Assessment: 0-10 Pain Score: 6  Pain Location:  R groin Pain Descriptors / Indicators: Sore Pain Intervention(s): Monitored during session, Limited activity within patient's tolerance    Home Living                          Prior Function            PT Goals (current goals can now be found in the care plan section) Acute  Rehab PT Goals Patient Stated Goal: to reduce R groin pain and restore independence PT Goal Formulation: With patient Time For Goal Achievement: 12/10/23 Progress towards PT goals: Progressing toward goals    Frequency    Min 3X/week      PT Plan      Co-evaluation              AM-PAC PT "6 Clicks" Mobility   Outcome Measure  Help needed turning from your back to your side while in a flat bed without using bedrails?: A Little Help needed moving from lying on your back to sitting on the side of a flat bed without using bedrails?: A Little Help needed moving to and from a bed to a chair (including a wheelchair)?: A Little Help needed standing up from a chair using your arms (e.g., wheelchair or bedside chair)?: A Little Help needed to walk in hospital room?: A Little Help needed climbing 3-5 steps with a railing? : A Lot 6 Click Score: 17    End of Session   Activity Tolerance: Patient tolerated treatment well Patient left: with call bell/phone within reach;with nursing/sitter in room;in chair (seated up EOB) Nurse Communication: Mobility status PT Visit Diagnosis: Other abnormalities of gait and mobility (R26.89);Muscle weakness (generalized) (M62.81)     Time: 1610-9604 PT Time Calculation (min) (ACUTE ONLY): 23 min  Charges:    $Gait Training: 8-22 mins $Therapeutic Activity: 8-22 mins PT General Charges $$ ACUTE PT VISIT: 1 Visit                     Joniqua Sidle R. PTA Acute Rehabilitation Services Office: [phone]   Catalina Antigua 11/28/2023, 9:16 AM

## 2023-11-28 NOTE — Progress Notes (Signed)
 Mobility Specialist Progress Note:    11/28/23 1042  Mobility  Activity Transferred from chair to bed  Level of Assistance Standby assist, set-up cues, supervision of patient - no hands on  Assistive Device Front wheel walker  Distance Ambulated (ft) 4 ft  Activity Response Tolerated well  Mobility Referral Yes  Mobility visit 1 Mobility  Mobility Specialist Start Time (ACUTE ONLY) 1035  Mobility Specialist Stop Time (ACUTE ONLY) 1042  Mobility Specialist Time Calculation (min) (ACUTE ONLY) 7 min   Pt requesting transfer C>B. Assisted in transfer while RN inserted suppository. Tolerated transfer well, asx throughout. Left pt in bed with all needs met, call bell in reach. RN at bedside.   Feliciana Rossetti Mobility Specialist Please contact via Special educational needs teacher or  Rehab office at [phone]

## 2023-11-29 DIAGNOSIS — I724 Aneurysm of artery of lower extremity: Secondary | ICD-10-CM | POA: Diagnosis not present

## 2023-11-29 DIAGNOSIS — D62 Acute posthemorrhagic anemia: Secondary | ICD-10-CM | POA: Diagnosis not present

## 2023-11-29 LAB — BASIC METABOLIC PANEL WITH GFR
Anion gap: 10 (ref 5–15)
BUN: 19 mg/dL (ref 8–23)
CO2: 23 mmol/L (ref 22–32)
Calcium: 8.6 mg/dL — ABNORMAL LOW (ref 8.9–10.3)
Chloride: 106 mmol/L (ref 98–111)
Creatinine, Ser: 1.17 mg/dL — ABNORMAL HIGH (ref 0.44–1.00)
GFR, Estimated: 46 mL/min — ABNORMAL LOW (ref >60)
Glucose, Bld: 129 mg/dL — ABNORMAL HIGH (ref 70–99)
Potassium: 4 mmol/L (ref 3.5–5.1)
Sodium: 139 mmol/L (ref 135–145)

## 2023-11-29 LAB — CBC
HCT: 27.1 % — ABNORMAL LOW (ref 36.0–46.0)
Hemoglobin: 9 g/dL — ABNORMAL LOW (ref 12.0–15.0)
MCH: 29.2 pg (ref 26.0–34.0)
MCHC: 33.2 g/dL (ref 30.0–36.0)
MCV: 88 fL (ref 80.0–100.0)
Platelets: 322 10*3/uL (ref 150–400)
RBC: 3.08 MIL/uL — ABNORMAL LOW (ref 3.87–5.11)
RDW: 15.9 % — ABNORMAL HIGH (ref 11.5–15.5)
WBC: 11.8 10*3/uL — ABNORMAL HIGH (ref 4.0–10.5)
nRBC: 0.2 % (ref 0.0–0.2)

## 2023-11-29 MED ORDER — ACETAMINOPHEN 325 MG PO TABS: 650.0000 mg | ORAL_TABLET | Freq: Four times a day (QID) | ORAL | Status: AC | PRN

## 2023-11-29 NOTE — Discharge Summary (Addendum)
 Physician Discharge Summary  [name] [name] ZOX:[id number] DOB: 17-Feb-1941  PCP: [name] [name]., MD  Admitted from: Home Discharged to: Home  Admit date: 11/24/2023 Discharge date: 11/29/2023  Recommendations for Outpatient Follow-up:    Follow-up Information     [name] Pastures, MD Follow up in 3 day(s).   Specialty: Vascular Surgery Why: Office will call you to arrange your appt (sent) Contact information: [address] [place] [address] [phone]         [name] [name], [facility] Follow up.   Specialty: Home Health Services Why: HHPT/OT/aide arranged - they will contact you to schedule Contact information: [address] [place] [address] [phone]         [name] [name]., MD. Schedule an appointment as soon as possible for a visit in 1 week(s).   Specialty: Internal Medicine Why: To be seen with repeat labs (CBC & BMP). Contact information: [address] [place] [address] [phone]                  Home Health: Home Health Orders (From admission, onward)     Start     Ordered   11/27/23 0704  Home Health  At discharge       Question Answer Comment  To provide the following care/treatments PT   To provide the following care/treatments OT   To provide the following care/treatments Home Health Aide      11/27/23 0704   11/26/23 1414  Home Health  At discharge       Question Answer Comment  To provide the following care/treatments PT   To provide the following care/treatments OT   To provide the following care/treatments Home Health Aide      11/26/23 1414             Equipment/Devices:     Durable Medical Equipment  (From admission, onward)           Start     Ordered   11/27/23 0705  For home use only DME 3 n 1  Once        11/27/23 0704             Discharge Condition: Improved and stable.   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  Patient had undergone diagnostic heart cath at [facility] on 3/28.  Next day, she noticed swelling, pain and purplish discoloration in the right groin area.  No numbness tingling or weakness in the thigh or lower leg.   Ultrasound showed 6.5 cmx 4.4 cm area arising off of the CFA with corrector sticks of a partially thrombosed pseudoaneurysm, neck measures approximately 1 cmx 1.2 cm.   Vascular surgery was  consulted, patient was admitted for further workup.     Assessment & Plan:    Principal Problem:   Femoral artery pseudo-aneurysm, right (HCC) -Developed status post diagnostic heart cath on 3/28 at [facility].  Now presented with right groin pseudoaneurysm -Vascular surgery consulted - S/P Right femoral artery pseudoaneurysm repair and evacuation of hematoma right thigh on 3/30, postop day #4 -Hemoglobin was 11.6 on admission which dropped to 7.5 on 4/1, posttransfusion, hemoglobin up to 9.8 > 9.1>9.  Hemoglobin stable for the last approximately 48 hours. -No overt bleeding.  Resumed prior home dose of Eliquis and aspirin. -Vascular surgery cleared her for discharge on 4/1 and will follow-up in office in 2 to 3 weeks.   Active Problems: Leukocytosis -Likely reactive, no infectious signs or symptoms, stable no fevers  -Improving,  procalcitonin less than 0.1 -Peak leukocytosis of 28K, down to 12 on day of discharge.   Acute blood loss anemia Management as noted above.   Acute kidney injury -Creatinine 1.0 on admission, peaked to 1.41 on 4/1. -Creatinine has improved from 1.41-1.18-1.17.  Some of this could have been due to contrast during procedure.  Avoid nephrotoxins.  -Continue to hold losartan at time of discharge until close outpatient follow-up with PCP in a week's time with repeat labs.  Moreover patient is already on Cardizem for BP control and did not want to precipitate worsening of her recent AKI.   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  As per patient's report, she has been on aspirin for 5+ years and Eliquis for 2+ years managed by her transplant team. -Continue CellCept and Prograf per pharmacy -Patient was advised to closely follow-up with patient's transplant team or PCP regarding need for both aspirin and Eliquis which obviously increases the bleeding risk.  Paroxysmal atrial fibrillation On further review today, noted that it appears that she does have history of this and on Eliquis likely for this indication Continue diltiazem and Eliquis.    Hypertension Continue Cardizem.  Holding Cozaar due to recent AKI. Controlled.   OSA -Not on CPAP   History of DVT in 2018 -Resumed Eliquis on 4/2.  See note above.   Constipation 4/2, patient reported that she last had a normal BM 6 days PTA.  Had a tiny BM last night (4/1) and passing flatus.  However patient expresses extreme concern regarding her constipation.  Reportedly in the past she had to have colonoscopy and disimpaction by [facility].  Continue MiraLAX and senna.  No improvement with Dulcolax suppository.  Trial of enema followed by a small BM.  Follow-up KUB without significant amount of stool suggesting that she probably was not overly constipated and just  did not have significant p.o. intake or stools present.  Continue home bowel regimen at DC.   Body mass index is 31.34 kg/m.     Consultants:   Vascular surgery   Procedures:   As noted above   Discharge Instructions  Discharge Instructions     Call MD for:  difficulty breathing, headache or visual disturbances   Complete by: As directed    Call MD for:  extreme fatigue   Complete by: As directed    Call MD for:  persistant dizziness or light-headedness   Complete by: As directed    Call MD for:  persistant nausea and vomiting   Complete by: As directed    Call MD for:  redness, tenderness, or signs of  infection (pain, swelling, redness, odor or green/yellow discharge around incision site)   Complete by: As directed    Call MD for:  severe uncontrolled pain   Complete by: As directed    Call MD for:  temperature >100.4   Complete by: As directed    Diet - low sodium heart healthy   Complete by: As directed    Increase activity slowly   Complete by: As directed         Medication List     PAUSE taking these medications    losartan 50 MG tablet Wait to take this until your doctor or other care provider tells you to start again. Commonly known as: COZAAR Take 1 tablet (50 mg total) by mouth daily.       TAKE these medications    acetaminophen 325 MG tablet Commonly known as: TYLENOL Take 2 tablets (650 mg total) by mouth every 6 (six) hours as needed for mild pain (pain score 1-3) (or Fever >/= 101).   Align Chew Chew 1 tablet by mouth daily at 6 (six) AM.   aspirin 81 MG tablet Take 81 mg by mouth daily.   Breo Ellipta 100-25 MCG/INH Aepb Generic drug: fluticasone furoate-vilanterol Inhale 1 puff into the lungs daily as needed (for shortness of breath).   calcium carbonate 500 MG chewable tablet Commonly known as: TUMS - dosed in mg elemental calcium Chew 1 tablet (200 mg of elemental calcium total) by mouth 3 (three) times daily as needed for  indigestion or heartburn.   cyanocobalamin 500 MCG tablet Commonly known as: VITAMIN B12 Take 500 mcg by mouth daily.   desvenlafaxine 50 MG 24 hr tablet Commonly known as: PRISTIQ Take 50 mg by mouth daily.   diltiazem 120 MG 24 hr capsule Commonly known as: CARDIZEM CD Take 120 mg by mouth 2 (two) times daily.   Eliquis 5 MG Tabs tablet Generic drug: apixaban Take 5 mg by mouth 2 (two) times daily.   famotidine 20 MG tablet Commonly known as: PEPCID Take 20 mg by mouth 2 (two) times daily.   levothyroxine 50 MCG tablet Commonly known as: SYNTHROID Take 50 mcg by mouth daily before breakfast.   magnesium oxide 400 MG tablet Commonly known as: MAG-OX Take 1 tablet (400 mg total) by mouth 2 (two) times daily.   MULTIVITAMIN PO Take 1 tablet by mouth daily.   mycophenolate 250 MG capsule Commonly known as: CELLCEPT Take 1 capsule by mouth 2 (two) times daily.   omeprazole 20 MG capsule Commonly known as: PRILOSEC Take 20 mg by mouth daily.   ondansetron 4 MG tablet Commonly known as: ZOFRAN Take 1 tablet (4 mg total) by mouth every 6 (six) hours as needed for nausea.   oxyCODONE 5 MG immediate release tablet Commonly known as: Oxy IR/ROXICODONE Take 1 tablet (5 mg total) by mouth every 6 (six) hours as needed for moderate pain (pain score 4-6).   polyethylene glycol 17 g packet Commonly known as: MIRALAX / GLYCOLAX Take 17 g by mouth 2 (two) times daily.   senna-docusate 8.6-50 MG tablet Commonly known as: Senokot-S Take 1 tablet by mouth at bedtime.   simethicone 80 MG chewable tablet Commonly known as: Gas-X Chew 1 tablet (80 mg total) by mouth every 6 (six) hours as needed for flatulence.   tacrolimus 0.5 MG capsule Commonly known as: PROGRAF Take 0.5 mg by mouth 2 (two) times daily.   Vitamin D (Ergocalciferol) 1.25 MG (50000 UNIT) Caps capsule Commonly  known as: DRISDOL Take 50,000 Units by mouth every Friday.       Allergies  Allergen  Reactions   Antihistamines, Chlorpheniramine-Type Other (See Comments)    Numbness   Augmentin [Amoxicillin-Pot Clavulanate]     Nausea only   Cyclobenzaprine Other (See Comments)   Fentanyl Other (See Comments)    Other reaction(s): Confusion (intolerance)   Metronidazole     Unknown reaction    Valtrex [Valacyclovir]     "Made my chin numb"   Codeine Nausea Only      Procedures/Studies: DG Abd Portable 1V Result Date: 11/28/2023 CLINICAL DATA:  Constipation EXAM: PORTABLE ABDOMEN - 1 VIEW COMPARISON:  07/11/2023, 11/24/2023 FINDINGS: Two supine frontal views of the abdomen and pelvis are obtained. No bowel obstruction or ileus. Minimal stool within the distal colon unchanged. Barium filled diverticular seen within the descending and sigmoid colon. No masses or abnormal calcifications. IMPRESSION: 1. Unremarkable bowel gas pattern. 2. Minimal stool within the colon, unchanged since recent CT. Electronically Signed   By: Sharlet Salina M.D.   On: 11/28/2023 19:40   VAS [place] GROIN PSEUDOANEURYSM Result Date: 11/25/2023  ARTERIAL PSEUDOANEURYSM  Patient Name:  [name] [name]  Date of Exam:   11/24/2023 Medical Rec #: [id number]            Accession #:    [id number] Date of Birth: 1940/09/17           Patient Gender: F Patient Age:   83 years Exam Location:  [facility] Procedure:      VAS [place] Bobetta Lime Referring Phys: Alvino Blood --------------------------------------------------------------------------------  Exam: Right groin Indications: Patient complains of groin pain, bruising and palpable knot. History: S/p catheterization. Limitations: Localized edema. Comparison Study: No prior exam. Performing Technologist: Fernande Bras  Examination Guidelines: A complete evaluation includes B-mode imaging, spectral Doppler, color Doppler, and power Doppler as needed of all accessible portions of each vessel. Bilateral testing is considered an integral part of a complete  examination. Limited examinations for reoccurring indications may be performed as noted. +------------+----------+----------+------+----------+ Right DuplexPSV (cm/s) Waveform PlaqueComment(s) +------------+----------+----------+------+----------+ Ext.Iliac      195     biphasic                  +------------+----------+----------+------+----------+ CFA            357    monophasic                 +------------+----------+----------+------+----------+ PFA             91    monophasic                 +------------+----------+----------+------+----------+ Prox SFA       106    monophasic                 +------------+----------+----------+------+----------+ Right Vein comments:The EIV, CFV and FV prox are patent with phasic flow.  Findings: An area with well defined borders measuring 6.5 cm x 4.4 cm was visualized arising off of the CFA with ultrasound characteristics of a partially thrombosed pseudoaneurysm. The neck measures approximately 1.0 cm wide and 1.2 cm long.   Suggest Peripheral Vascular Consult. Diagnosing physician: Carolynn Sayers Electronically signed by Carolynn Sayers on 11/25/2023 at 1:07:32 PM.    --------------------------------------------------------------------------------    Final    CT Angio Abd/Pel w/ and/or w/o Result Date: 11/24/2023 CLINICAL DATA:  Pseudoaneurysm.  Postprocedure swelling. EXAM: CTA ABDOMEN AND PELVIS WITHOUT AND WITH CONTRAST TECHNIQUE: Multidetector  CT imaging of the abdomen and pelvis was performed using the standard protocol during bolus administration of intravenous contrast. Multiplanar reconstructed images and MIPs were obtained and reviewed to evaluate the vascular anatomy. RADIATION DOSE REDUCTION: This exam was performed according to the departmental dose-optimization program which includes automated exposure control, adjustment of the mA and/or kV according to patient size and/or use of iterative reconstruction technique. CONTRAST:   75mL OMNIPAQUE IOHEXOL 350 MG/ML SOLN COMPARISON:  Abdominopelvic CT 09/13/2023 FINDINGS: VASCULAR Aorta: Calcified and noncalcified plaque in the descending thoracic aorta. Calcified plaque in the abdominal aorta. No aneurysm, dissection, or significant stenosis. Celiac: Patent without evidence of aneurysm, dissection, vasculitis or significant stenosis. SMA: Patent without evidence of aneurysm, dissection, vasculitis or significant stenosis. Renals: Plaque at the origin of the renal arteries causes mild stenosis. No dissection, aneurysm or acute findings. IMA: Patent without evidence of aneurysm, dissection, vasculitis or significant stenosis. Inflow: Patent without evidence of aneurysm, dissection, vasculitis or significant stenosis. Proximal Outflow: Pseudoaneurysm arising from the right common femoral artery just before the profundus femora takeoff. This is bilobed in appearance and spans approximately 3.1 x 4.1 cm, series 5, image 221 and series 8, image 104. Thin tail extends anteriorly, series 5, image 226 to an anterior groin hematoma measuring 3.9 x 3.2 x 4.8 cm, series 5, image 232 and series 8, image 122. Moderate amount of adjacent fat stranding. The right common femoral and profundus femoral arteries are patent. Left outflow tract is widely patent. Veins: No obvious venous abnormality within the limitations of this arterial phase study. Review of the MIP images confirms the above findings. NON-VASCULAR Lower chest: Heterogeneous pulmonary parenchyma. Distal esophagus is dilated and fluid-filled. Small to moderate-sized hiatal hernia. Hepatobiliary: No focal liver abnormality on this arterial phase exam. Unremarkable gallbladder. No biliary dilatation. Pancreas: Fatty atrophy.  No ductal dilatation or inflammation. Spleen: Normal in size and arterial enhancement. Adrenals/Urinary Tract: No adrenal nodule. Right renal atrophy. Subcentimeter bilateral renal lesions are too small to characterize. No  further follow-up imaging is recommended. Unremarkable urinary bladder. Stomach/Bowel: Fluid-filled distal esophagus. Small to moderate hiatal hernia. 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 There is no small bowel obstruction or inflammation. Moderate colonic diverticulosis. No diverticulitis. Colon is redundant. The appendix is not definitively seen. Cecum is located in the right upper quadrant. Lymphatic: No adenopathy. Reproductive: Status post hysterectomy. No adnexal masses. Other: No ascites.  No free intra-abdominal air. Musculoskeletal: There are no acute or suspicious osseous abnormalities. Lower lumbar facet hypertrophy. IMPRESSION: 1. Pseudoaneurysm arising from the right common femoral artery. This is bilobed in appearance and spans approximately 3.1 x 4.1 cm. Thin tail extends anteriorly to an anterior groin hematoma measuring 3.9 x 3.2 x 4.8 cm. 2. Colonic diverticulosis without diverticulitis. 3. Small to moderate hiatal hernia. Fluid-filled distal esophagus may be due to reflux or dysmotility. Aortic Atherosclerosis (ICD10-I70.0). Electronically Signed   By: Narda Rutherford M.D.   On: 11/24/2023 19:52      Subjective: Overall states that she feels okay.  Feels slightly cold.  Had small BM after enema yesterday.  No nausea, vomiting or abdominal pain.  Feels comfortable going home.  As per nursing, no acute issues noted.  Discharge Exam:  Vitals:   11/29/23 0438 11/29/23 0842 11/29/23 0843 11/29/23 1201  BP: (!) 155/53 133/60 133/60 (!) 135/59  Pulse: 79 (!) 101 (!) 101 80  Resp: (!) 21 20 20  (!) 22  Temp: 98.5 F (36.9 C) 98.4 F (  36.9 C) 98.4 F (36.9 C) 98.4 F (36.9 C)  TempSrc: Oral Oral  Oral  SpO2: 94% 97% 97% 100%  Weight:      Height:        General exam: Elderly female, moderately built and nourished lying comfortably supine in bed without distress. Respiratory system: Clear to auscultation. Respiratory effort  normal. Cardiovascular system: S1 & S2 heard, RRR. No JVD, murmurs, rubs, gallops or clicks. No pedal edema.  Telemetry personally reviewed: Sinus rhythm Gastrointestinal system: Abdomen is nondistended, soft and nontender. No organomegaly or masses felt. Normal bowel sounds heard. Central nervous system: Alert and oriented. No focal neurological deficits. Extremities: Symmetric 5 x 5 power. Skin: No rashes, lesions or ulcers.  Large area of ecchymosis right groin extending to medial aspect of right thigh up to just above the knee-all appears old.  Advised patient that this may take several weeks to resolve.  She verbalized understanding. Psychiatry: Judgement and insight appear normal. Mood & affect appropriate.     The results of significant diagnostics from this hospitalization (including imaging, microbiology, ancillary and laboratory) are listed below for reference.     Microbiology: Recent Results (from the past 240 hours)  Surgical pcr screen     Status: Abnormal   Collection Time: 11/25/23  6:14 AM   Specimen: Nasal Mucosa; Nasal Swab  Result Value Ref Range Status   MRSA, PCR NEGATIVE NEGATIVE Final   Staphylococcus aureus POSITIVE (A) NEGATIVE Final    Comment: (NOTE) The Xpert SA Assay (FDA approved for NASAL specimens in patients 35 years of age and older), is one component of a comprehensive surveillance program. It is not intended to diagnose infection nor to guide or monitor treatment. Performed at [facility], [address] [place], [place] [address]   Culture, blood (Routine X 2) w Reflex to ID Panel     Status: None (Preliminary result)   Collection Time: 11/26/23  2:22 PM   Specimen: BLOOD LEFT ARM  Result Value Ref Range Status   Specimen Description BLOOD LEFT ARM  Final   Special Requests   Final    BOTTLES DRAWN AEROBIC AND ANAEROBIC Blood Culture results may not be optimal due to an inadequate volume of blood received in culture bottles   Culture    Final    NO GROWTH 3 DAYS Performed at [facility], [address] [place], [place] [address]    Report Status PENDING  Incomplete  Culture, blood (Routine X 2) w Reflex to ID Panel     Status: None (Preliminary result)   Collection Time: 11/26/23  2:28 PM   Specimen: BLOOD  Result Value Ref Range Status   Specimen Description BLOOD SITE NOT SPECIFIED  Final   Special Requests   Final    BOTTLES DRAWN AEROBIC AND ANAEROBIC Blood Culture adequate volume   Culture   Final    NO GROWTH 3 DAYS Performed at [facility], [address] [place], [place] [address]    Report Status PENDING  Incomplete     Labs: CBC: Recent Labs  Lab 11/24/23 1755 11/25/23 0343 11/26/23 0941 11/27/23 0455 11/27/23 1724 11/28/23 0349 11/29/23 0336  WBC 14.0* 14.0* 28.0* 21.9*  --  18.1* 11.8*  NEUTROABS 10.3*  --   --   --   --   --   --   HGB 11.6* 11.6* 9.6* 7.5* 9.8* 9.1* 9.0*  HCT 34.5* 33.8* 28.8* 22.7* 29.5* 27.5* 27.1*  MCV  90.6 88.3 91.1 90.4  --  88.4 88.0  PLT 251 263 256 245  --  278 322    Basic Metabolic Panel: Recent Labs  Lab 11/25/23 0343 11/26/23 0941 11/27/23 0455 11/28/23 0349 11/29/23 0336  NA 136 136 139 138 139  K 4.0 4.3 4.6 4.0 4.0  CL 108 107 107 106 106  CO2 22 19* 22 24 23   GLUCOSE 127* 170* 142* 141* 129*  BUN 12 17 26* 23 19  CREATININE 1.00 1.24* 1.41* 1.18* 1.17*  CALCIUM 8.6* 9.1 9.1 9.1 8.6*  MG 1.8  --   --   --   --     Liver Function Tests: Recent Labs  Lab 11/24/23 1755  AST 20  ALT 15  ALKPHOS 73  BILITOT 0.7  PROT 5.9*  ALBUMIN 2.9*    Left VM message for patient's daughter.   Time coordinating discharge: 45 minutes  SIGNED:  Marcellus Scott, MD,  FACP, [facility], [facility], [facility]   [facility] [place]     To contact the attending provider between 7A-7P or the covering provider during after hours 7P-7A, please log into the web site [email or web address] and access using universal [facility] password for that web site. If you do not have the password, please call the hospital operator.

## 2023-11-29 NOTE — TOC Transition Note (Signed)
 Transition of Care (TOC) - Discharge Note Donn Pierini RN, BSN Transitions of Care Unit 4E- RN Case Manager See Treatment Team for direct phone #   Patient Details  Name: [name] [name] MRN: [id number] Date of Birth: 1940-10-02  Transition of Care [facility]) CM/SW Contact:  Darrold Span, RN Phone Number: 11/29/2023, 2:09 PM   Clinical Narrative:    Pt stable for transition home, HH has been set up w/ [facility]- liaison updated for start of care.  Address, phone # confirmed for contact.   CM confirmed again with pt that she does not want/need [facility] for discharge- pt has declined BSC.   Daughter to transport home.   No further TOC interventions needed.    Final next level of care: Home w Home Health Services Barriers to Discharge: Barriers Resolved   Patient Goals and CMS Choice Patient states their goals for this hospitalization and ongoing recovery are:: return home to [facility] CMS Medicare.gov Compare Post Acute Care list provided to:: Patient Choice offered to / list presented to : Patient      Discharge Placement               Home w/ [facility]        Discharge Plan and Services Additional resources added to the After Visit Summary for   In-house Referral: Clinical Social Work Discharge Planning Services: CM Consult Post Acute Care Choice: Durable Medical Equipment, Home Health          DME Arranged: Patient refused services DME Agency: NA       HH Arranged: PT, OT, Nurse's Aide HH Agency: [facility] Date [facility] Agency Contacted: 11/27/23 Time HH Agency Contacted: [phone] Representative spoke with at [facility] Agency: Haywood Lasso  Social Drivers of Health (SDOH) Interventions SDOH Screenings   Food Insecurity: No Food Insecurity (11/24/2023)  Housing: Low Risk  (11/24/2023)  Transportation Needs: No Transportation Needs (11/24/2023)  Utilities: Not At Risk (11/24/2023)  Social Connections: Socially Integrated (11/24/2023)  Tobacco Use: Medium Risk (11/25/2023)      Readmission Risk Interventions    11/29/2023    2:09 PM  Readmission Risk Prevention Plan  Transportation Screening Complete  Home Care Screening Complete  Medication Review (RN CM) Complete

## 2023-11-29 NOTE — Plan of Care (Signed)

## 2023-11-29 NOTE — Discharge Instructions (Signed)

## 2023-11-29 NOTE — TOC CM/SW Note (Signed)
 Due to mobility limited by RLE pain, limiting standing and activity tolerance and medical condition that causes urgent need to use the bathroom, a bedside commode will help prevent and reduce risk from falls.

## 2023-11-30 DIAGNOSIS — I2510 Atherosclerotic heart disease of native coronary artery without angina pectoris: Secondary | ICD-10-CM | POA: Diagnosis not present

## 2023-11-30 DIAGNOSIS — N179 Acute kidney failure, unspecified: Secondary | ICD-10-CM | POA: Diagnosis not present

## 2023-11-30 DIAGNOSIS — Z48812 Encounter for surgical aftercare following surgery on the circulatory system: Secondary | ICD-10-CM | POA: Diagnosis not present

## 2023-11-30 DIAGNOSIS — Z5982 Transportation insecurity: Secondary | ICD-10-CM | POA: Diagnosis not present

## 2023-11-30 DIAGNOSIS — E039 Hypothyroidism, unspecified: Secondary | ICD-10-CM | POA: Diagnosis not present

## 2023-11-30 DIAGNOSIS — Z9181 History of falling: Secondary | ICD-10-CM | POA: Diagnosis not present

## 2023-11-30 DIAGNOSIS — K5900 Constipation, unspecified: Secondary | ICD-10-CM | POA: Diagnosis not present

## 2023-11-30 DIAGNOSIS — Z9049 Acquired absence of other specified parts of digestive tract: Secondary | ICD-10-CM | POA: Diagnosis not present

## 2023-11-30 DIAGNOSIS — Z7901 Long term (current) use of anticoagulants: Secondary | ICD-10-CM | POA: Diagnosis not present

## 2023-11-30 DIAGNOSIS — Z86718 Personal history of other venous thrombosis and embolism: Secondary | ICD-10-CM | POA: Diagnosis not present

## 2023-11-30 DIAGNOSIS — K219 Gastro-esophageal reflux disease without esophagitis: Secondary | ICD-10-CM | POA: Diagnosis not present

## 2023-11-30 DIAGNOSIS — Z90722 Acquired absence of ovaries, bilateral: Secondary | ICD-10-CM | POA: Diagnosis not present

## 2023-11-30 DIAGNOSIS — D72829 Elevated white blood cell count, unspecified: Secondary | ICD-10-CM | POA: Diagnosis not present

## 2023-11-30 DIAGNOSIS — I10 Essential (primary) hypertension: Secondary | ICD-10-CM | POA: Diagnosis not present

## 2023-11-30 DIAGNOSIS — G4733 Obstructive sleep apnea (adult) (pediatric): Secondary | ICD-10-CM | POA: Diagnosis not present

## 2023-11-30 DIAGNOSIS — H9193 Unspecified hearing loss, bilateral: Secondary | ICD-10-CM | POA: Diagnosis not present

## 2023-11-30 DIAGNOSIS — Z87891 Personal history of nicotine dependence: Secondary | ICD-10-CM | POA: Diagnosis not present

## 2023-11-30 DIAGNOSIS — M810 Age-related osteoporosis without current pathological fracture: Secondary | ICD-10-CM | POA: Diagnosis not present

## 2023-11-30 DIAGNOSIS — Z90710 Acquired absence of both cervix and uterus: Secondary | ICD-10-CM | POA: Diagnosis not present

## 2023-11-30 DIAGNOSIS — Z7982 Long term (current) use of aspirin: Secondary | ICD-10-CM | POA: Diagnosis not present

## 2023-11-30 DIAGNOSIS — F419 Anxiety disorder, unspecified: Secondary | ICD-10-CM | POA: Diagnosis not present

## 2023-11-30 DIAGNOSIS — Z7951 Long term (current) use of inhaled steroids: Secondary | ICD-10-CM | POA: Diagnosis not present

## 2023-11-30 DIAGNOSIS — Z941 Heart transplant status: Secondary | ICD-10-CM | POA: Diagnosis not present

## 2023-11-30 DIAGNOSIS — F32A Depression, unspecified: Secondary | ICD-10-CM | POA: Diagnosis not present

## 2023-11-30 DIAGNOSIS — D62 Acute posthemorrhagic anemia: Secondary | ICD-10-CM | POA: Diagnosis not present

## 2023-12-01 LAB — CULTURE, BLOOD (ROUTINE X 2)
Culture: NO GROWTH
Culture: NO GROWTH
Special Requests: ADEQUATE

## 2023-12-03 ENCOUNTER — Ambulatory Visit: Admitting: Physician Assistant

## 2023-12-03 ENCOUNTER — Telehealth: Payer: Self-pay

## 2023-12-03 VITALS — BP 110/66 | HR 87 | Temp 98.3°F | Ht 62.0 in | Wt 171.3 lb

## 2023-12-03 DIAGNOSIS — I724 Aneurysm of artery of lower extremity: Secondary | ICD-10-CM

## 2023-12-03 NOTE — Progress Notes (Signed)
 POST OPERATIVE OFFICE NOTE    CC:  F/u for surgery  HPI:  This is a 83 y.o. female who developed a pseudoaneurysm after recent heart cath the day beforeat [facility] she is s/p Right femoral artery pseudoaneurysm repair by Dr. Hetty Blend on 11/25/23.  Painful incision post op and required PT eval and recommendation prior to returning home to care for her husband who has Parkinson disease.  She was set up with [facility] PT and 3 in 1 for safety.  She was discharge on 11/29/23.   She is concerned about the continued firmness in the proximal right thigh below the incision and the firmness of the incision.  She is working with PT and walking with a rolling walker.    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 , hypothyroidism, HTN, anxiety, depression, parathyroidectomy, GERD, osteoporosis, OSA on not on CPAP      Allergies  Allergen Reactions   Antihistamines, Chlorpheniramine-Type Other (See Comments)    Numbness   Augmentin [Amoxicillin-Pot Clavulanate]     Nausea only   Cyclobenzaprine Other (See Comments)   Fentanyl Other (See Comments)    Other reaction(s): Confusion (intolerance)   Metronidazole     Unknown reaction    Valtrex [Valacyclovir]     "Made my chin numb"   Codeine Nausea Only    Current Outpatient Medications  Medication Sig Dispense Refill   acetaminophen (TYLENOL) 325 MG tablet Take 2 tablets (650 mg total) by mouth every 6 (six) hours as needed for mild pain (pain score 1-3) (or Fever >/= 101).     apixaban (ELIQUIS) 5 MG TABS tablet Take 5 mg by mouth 2 (two) times daily.     aspirin 81 MG tablet Take 81 mg by mouth daily.     calcium carbonate (TUMS - DOSED IN MG ELEMENTAL CALCIUM) 500 MG chewable tablet Chew 1 tablet (200 mg of elemental calcium total) by mouth 3 (three) times daily as needed for indigestion or heartburn.     cyanocobalamin 500 MCG tablet Take 500 mcg by mouth daily.     desvenlafaxine (PRISTIQ) 50 MG 24 hr tablet Take 50 mg by mouth  daily.     diltiazem (CARDIZEM CD) 120 MG 24 hr capsule Take 120 mg by mouth 2 (two) times daily.     famotidine (PEPCID) 20 MG tablet Take 20 mg by mouth 2 (two) times daily.     fluticasone furoate-vilanterol (BREO ELLIPTA) 100-25 MCG/INH AEPB Inhale 1 puff into the lungs daily as needed (for shortness of breath).     levothyroxine (SYNTHROID, LEVOTHROID) 50 MCG tablet Take 50 mcg by mouth daily before breakfast.     [Paused] losartan (COZAAR) 50 MG tablet Take 1 tablet (50 mg total) by mouth daily. 30 tablet 0   magnesium oxide (MAG-OX) 400 MG tablet Take 1 tablet (400 mg total) by mouth 2 (two) times daily. 30 tablet 0   Multiple Vitamins-Minerals (MULTIVITAMIN PO) Take 1 tablet by mouth daily.     mycophenolate (CELLCEPT) 250 MG capsule Take 1 capsule by mouth 2 (two) times daily.     omeprazole (PRILOSEC) 20 MG capsule Take 20 mg by mouth daily.     ondansetron (ZOFRAN) 4 MG tablet Take 1 tablet (4 mg total) by mouth every 6 (six) hours as needed for nausea. 30 tablet 0   oxyCODONE (OXY IR/ROXICODONE) 5 MG immediate release tablet Take 1 tablet (5 mg total) by mouth every 6 (six) hours as needed for moderate pain (  pain score 4-6). 14 tablet 0   polyethylene glycol (MIRALAX / GLYCOLAX) 17 g packet Take 17 g by mouth 2 (two) times daily. 30 each 0   Probiotic Product (ALIGN) CHEW Chew 1 tablet by mouth daily at 6 (six) AM.     senna-docusate (SENOKOT-S) 8.6-50 MG tablet Take 1 tablet by mouth at bedtime. 30 tablet 0   simethicone (GAS-X) 80 MG chewable tablet Chew 1 tablet (80 mg total) by mouth every 6 (six) hours as needed for flatulence. 30 tablet 0   tacrolimus (PROGRAF) 0.5 MG capsule Take 0.5 mg by mouth 2 (two) times daily.     Vitamin D, Ergocalciferol, (DRISDOL) 50000 UNITS CAPS capsule Take 50,000 Units by mouth every Friday.      No current facility-administered medications for this visit.     ROS:  See HPI  Physical Exam:    Incision:  Healing well with healing ridge.  No  erythema or recurrent hematoma Extremities:  Motor and sensation intact  Positive ecchymosis, resolving slowly, soft    Assessment/Plan:  This is a 83 y.o. female Right femoral Pseudoaneurysm post cardiac cath 11/23/23 S/P Right femoral artery pseudoaneurysm repair and evacuation of hematoma right thigh   Good inflow with palpable pedal pulse on the right LE Ecchymosis right thigh, softer compartments, incision is healing well   -Reassurance that the hematoma is resolving and no recurrent edema from new hematoma is present.  Continue PT, home exercise program increasing mobility safely.  Her daughter who is a Engineer, civil (consulting) was with her and all questions and concern were addressed.    Mosetta Pigeon PA-C [facility] [phone]   Clinic MD:  Myra Gianotti

## 2023-12-03 NOTE — Telephone Encounter (Signed)
 Error

## 2023-12-03 NOTE — Telephone Encounter (Signed)
 Triage: -received call from pt who states she has worsening pain in her right groin and an egg size lump right below where they went it at..  She states she talked to Dr. Myra Gianotti last night and he wanted to come in and have it looked at.   -she states she felt the lump yesterday which she does not feel like it has grown. As for as the pain she states its hard to tell because she has had a lot of pain since she first had the cath and she had to have the aneurysm fixed and now this.   -booked triage appt

## 2023-12-04 DIAGNOSIS — F32A Depression, unspecified: Secondary | ICD-10-CM | POA: Diagnosis not present

## 2023-12-04 DIAGNOSIS — N179 Acute kidney failure, unspecified: Secondary | ICD-10-CM | POA: Diagnosis not present

## 2023-12-04 DIAGNOSIS — F419 Anxiety disorder, unspecified: Secondary | ICD-10-CM | POA: Diagnosis not present

## 2023-12-04 DIAGNOSIS — Z90722 Acquired absence of ovaries, bilateral: Secondary | ICD-10-CM | POA: Diagnosis not present

## 2023-12-04 DIAGNOSIS — Z5982 Transportation insecurity: Secondary | ICD-10-CM | POA: Diagnosis not present

## 2023-12-04 DIAGNOSIS — D62 Acute posthemorrhagic anemia: Secondary | ICD-10-CM | POA: Diagnosis not present

## 2023-12-04 DIAGNOSIS — Z86718 Personal history of other venous thrombosis and embolism: Secondary | ICD-10-CM | POA: Diagnosis not present

## 2023-12-04 DIAGNOSIS — Z941 Heart transplant status: Secondary | ICD-10-CM | POA: Diagnosis not present

## 2023-12-04 DIAGNOSIS — D72829 Elevated white blood cell count, unspecified: Secondary | ICD-10-CM | POA: Diagnosis not present

## 2023-12-04 DIAGNOSIS — M810 Age-related osteoporosis without current pathological fracture: Secondary | ICD-10-CM | POA: Diagnosis not present

## 2023-12-04 DIAGNOSIS — Z9049 Acquired absence of other specified parts of digestive tract: Secondary | ICD-10-CM | POA: Diagnosis not present

## 2023-12-04 DIAGNOSIS — Z7982 Long term (current) use of aspirin: Secondary | ICD-10-CM | POA: Diagnosis not present

## 2023-12-04 DIAGNOSIS — H9193 Unspecified hearing loss, bilateral: Secondary | ICD-10-CM | POA: Diagnosis not present

## 2023-12-04 DIAGNOSIS — I2510 Atherosclerotic heart disease of native coronary artery without angina pectoris: Secondary | ICD-10-CM | POA: Diagnosis not present

## 2023-12-04 DIAGNOSIS — Z90710 Acquired absence of both cervix and uterus: Secondary | ICD-10-CM | POA: Diagnosis not present

## 2023-12-04 DIAGNOSIS — I10 Essential (primary) hypertension: Secondary | ICD-10-CM | POA: Diagnosis not present

## 2023-12-04 DIAGNOSIS — Z9181 History of falling: Secondary | ICD-10-CM | POA: Diagnosis not present

## 2023-12-04 DIAGNOSIS — K219 Gastro-esophageal reflux disease without esophagitis: Secondary | ICD-10-CM | POA: Diagnosis not present

## 2023-12-04 DIAGNOSIS — K5900 Constipation, unspecified: Secondary | ICD-10-CM | POA: Diagnosis not present

## 2023-12-04 DIAGNOSIS — Z7901 Long term (current) use of anticoagulants: Secondary | ICD-10-CM | POA: Diagnosis not present

## 2023-12-04 DIAGNOSIS — E039 Hypothyroidism, unspecified: Secondary | ICD-10-CM | POA: Diagnosis not present

## 2023-12-04 DIAGNOSIS — G4733 Obstructive sleep apnea (adult) (pediatric): Secondary | ICD-10-CM | POA: Diagnosis not present

## 2023-12-04 DIAGNOSIS — Z48812 Encounter for surgical aftercare following surgery on the circulatory system: Secondary | ICD-10-CM | POA: Diagnosis not present

## 2023-12-04 DIAGNOSIS — Z87891 Personal history of nicotine dependence: Secondary | ICD-10-CM | POA: Diagnosis not present

## 2023-12-04 DIAGNOSIS — Z7951 Long term (current) use of inhaled steroids: Secondary | ICD-10-CM | POA: Diagnosis not present

## 2023-12-05 DIAGNOSIS — Z941 Heart transplant status: Secondary | ICD-10-CM | POA: Diagnosis not present

## 2023-12-05 DIAGNOSIS — I10 Essential (primary) hypertension: Secondary | ICD-10-CM | POA: Diagnosis not present

## 2023-12-05 DIAGNOSIS — F419 Anxiety disorder, unspecified: Secondary | ICD-10-CM | POA: Diagnosis not present

## 2023-12-05 DIAGNOSIS — H9193 Unspecified hearing loss, bilateral: Secondary | ICD-10-CM | POA: Diagnosis not present

## 2023-12-05 DIAGNOSIS — Z90722 Acquired absence of ovaries, bilateral: Secondary | ICD-10-CM | POA: Diagnosis not present

## 2023-12-05 DIAGNOSIS — D62 Acute posthemorrhagic anemia: Secondary | ICD-10-CM | POA: Diagnosis not present

## 2023-12-05 DIAGNOSIS — Z9181 History of falling: Secondary | ICD-10-CM | POA: Diagnosis not present

## 2023-12-05 DIAGNOSIS — M810 Age-related osteoporosis without current pathological fracture: Secondary | ICD-10-CM | POA: Diagnosis not present

## 2023-12-05 DIAGNOSIS — G4733 Obstructive sleep apnea (adult) (pediatric): Secondary | ICD-10-CM | POA: Diagnosis not present

## 2023-12-05 DIAGNOSIS — Z7982 Long term (current) use of aspirin: Secondary | ICD-10-CM | POA: Diagnosis not present

## 2023-12-05 DIAGNOSIS — Z90710 Acquired absence of both cervix and uterus: Secondary | ICD-10-CM | POA: Diagnosis not present

## 2023-12-05 DIAGNOSIS — K5900 Constipation, unspecified: Secondary | ICD-10-CM | POA: Diagnosis not present

## 2023-12-05 DIAGNOSIS — Z48812 Encounter for surgical aftercare following surgery on the circulatory system: Secondary | ICD-10-CM | POA: Diagnosis not present

## 2023-12-05 DIAGNOSIS — Z87891 Personal history of nicotine dependence: Secondary | ICD-10-CM | POA: Diagnosis not present

## 2023-12-05 DIAGNOSIS — I2510 Atherosclerotic heart disease of native coronary artery without angina pectoris: Secondary | ICD-10-CM | POA: Diagnosis not present

## 2023-12-05 DIAGNOSIS — N179 Acute kidney failure, unspecified: Secondary | ICD-10-CM | POA: Diagnosis not present

## 2023-12-05 DIAGNOSIS — F32A Depression, unspecified: Secondary | ICD-10-CM | POA: Diagnosis not present

## 2023-12-05 DIAGNOSIS — Z7901 Long term (current) use of anticoagulants: Secondary | ICD-10-CM | POA: Diagnosis not present

## 2023-12-05 DIAGNOSIS — K219 Gastro-esophageal reflux disease without esophagitis: Secondary | ICD-10-CM | POA: Diagnosis not present

## 2023-12-05 DIAGNOSIS — Z9049 Acquired absence of other specified parts of digestive tract: Secondary | ICD-10-CM | POA: Diagnosis not present

## 2023-12-05 DIAGNOSIS — Z5982 Transportation insecurity: Secondary | ICD-10-CM | POA: Diagnosis not present

## 2023-12-05 DIAGNOSIS — Z86718 Personal history of other venous thrombosis and embolism: Secondary | ICD-10-CM | POA: Diagnosis not present

## 2023-12-05 DIAGNOSIS — E039 Hypothyroidism, unspecified: Secondary | ICD-10-CM | POA: Diagnosis not present

## 2023-12-05 DIAGNOSIS — Z7951 Long term (current) use of inhaled steroids: Secondary | ICD-10-CM | POA: Diagnosis not present

## 2023-12-05 DIAGNOSIS — D72829 Elevated white blood cell count, unspecified: Secondary | ICD-10-CM | POA: Diagnosis not present

## 2023-12-11 DIAGNOSIS — Z941 Heart transplant status: Secondary | ICD-10-CM | POA: Diagnosis not present

## 2023-12-11 DIAGNOSIS — N179 Acute kidney failure, unspecified: Secondary | ICD-10-CM | POA: Diagnosis not present

## 2023-12-11 DIAGNOSIS — M810 Age-related osteoporosis without current pathological fracture: Secondary | ICD-10-CM | POA: Diagnosis not present

## 2023-12-11 DIAGNOSIS — Z90710 Acquired absence of both cervix and uterus: Secondary | ICD-10-CM | POA: Diagnosis not present

## 2023-12-11 DIAGNOSIS — Z86718 Personal history of other venous thrombosis and embolism: Secondary | ICD-10-CM | POA: Diagnosis not present

## 2023-12-11 DIAGNOSIS — F32A Depression, unspecified: Secondary | ICD-10-CM | POA: Diagnosis not present

## 2023-12-11 DIAGNOSIS — G4733 Obstructive sleep apnea (adult) (pediatric): Secondary | ICD-10-CM | POA: Diagnosis not present

## 2023-12-11 DIAGNOSIS — D62 Acute posthemorrhagic anemia: Secondary | ICD-10-CM | POA: Diagnosis not present

## 2023-12-11 DIAGNOSIS — K219 Gastro-esophageal reflux disease without esophagitis: Secondary | ICD-10-CM | POA: Diagnosis not present

## 2023-12-11 DIAGNOSIS — D72829 Elevated white blood cell count, unspecified: Secondary | ICD-10-CM | POA: Diagnosis not present

## 2023-12-11 DIAGNOSIS — Z7951 Long term (current) use of inhaled steroids: Secondary | ICD-10-CM | POA: Diagnosis not present

## 2023-12-11 DIAGNOSIS — Z90722 Acquired absence of ovaries, bilateral: Secondary | ICD-10-CM | POA: Diagnosis not present

## 2023-12-11 DIAGNOSIS — Z48812 Encounter for surgical aftercare following surgery on the circulatory system: Secondary | ICD-10-CM | POA: Diagnosis not present

## 2023-12-11 DIAGNOSIS — F419 Anxiety disorder, unspecified: Secondary | ICD-10-CM | POA: Diagnosis not present

## 2023-12-11 DIAGNOSIS — I2510 Atherosclerotic heart disease of native coronary artery without angina pectoris: Secondary | ICD-10-CM | POA: Diagnosis not present

## 2023-12-11 DIAGNOSIS — H9193 Unspecified hearing loss, bilateral: Secondary | ICD-10-CM | POA: Diagnosis not present

## 2023-12-11 DIAGNOSIS — Z9181 History of falling: Secondary | ICD-10-CM | POA: Diagnosis not present

## 2023-12-11 DIAGNOSIS — Z7901 Long term (current) use of anticoagulants: Secondary | ICD-10-CM | POA: Diagnosis not present

## 2023-12-11 DIAGNOSIS — E039 Hypothyroidism, unspecified: Secondary | ICD-10-CM | POA: Diagnosis not present

## 2023-12-11 DIAGNOSIS — Z7982 Long term (current) use of aspirin: Secondary | ICD-10-CM | POA: Diagnosis not present

## 2023-12-11 DIAGNOSIS — Z87891 Personal history of nicotine dependence: Secondary | ICD-10-CM | POA: Diagnosis not present

## 2023-12-11 DIAGNOSIS — Z5982 Transportation insecurity: Secondary | ICD-10-CM | POA: Diagnosis not present

## 2023-12-11 DIAGNOSIS — Z9049 Acquired absence of other specified parts of digestive tract: Secondary | ICD-10-CM | POA: Diagnosis not present

## 2023-12-11 DIAGNOSIS — K5900 Constipation, unspecified: Secondary | ICD-10-CM | POA: Diagnosis not present

## 2023-12-11 DIAGNOSIS — I10 Essential (primary) hypertension: Secondary | ICD-10-CM | POA: Diagnosis not present

## 2023-12-12 DIAGNOSIS — Z7951 Long term (current) use of inhaled steroids: Secondary | ICD-10-CM | POA: Diagnosis not present

## 2023-12-12 DIAGNOSIS — Z48812 Encounter for surgical aftercare following surgery on the circulatory system: Secondary | ICD-10-CM | POA: Diagnosis not present

## 2023-12-12 DIAGNOSIS — I2510 Atherosclerotic heart disease of native coronary artery without angina pectoris: Secondary | ICD-10-CM | POA: Diagnosis not present

## 2023-12-12 DIAGNOSIS — Z90710 Acquired absence of both cervix and uterus: Secondary | ICD-10-CM | POA: Diagnosis not present

## 2023-12-12 DIAGNOSIS — Z5982 Transportation insecurity: Secondary | ICD-10-CM | POA: Diagnosis not present

## 2023-12-12 DIAGNOSIS — K219 Gastro-esophageal reflux disease without esophagitis: Secondary | ICD-10-CM | POA: Diagnosis not present

## 2023-12-12 DIAGNOSIS — F32A Depression, unspecified: Secondary | ICD-10-CM | POA: Diagnosis not present

## 2023-12-12 DIAGNOSIS — Z7901 Long term (current) use of anticoagulants: Secondary | ICD-10-CM | POA: Diagnosis not present

## 2023-12-12 DIAGNOSIS — D72829 Elevated white blood cell count, unspecified: Secondary | ICD-10-CM | POA: Diagnosis not present

## 2023-12-12 DIAGNOSIS — I10 Essential (primary) hypertension: Secondary | ICD-10-CM | POA: Diagnosis not present

## 2023-12-12 DIAGNOSIS — Z86718 Personal history of other venous thrombosis and embolism: Secondary | ICD-10-CM | POA: Diagnosis not present

## 2023-12-12 DIAGNOSIS — G4733 Obstructive sleep apnea (adult) (pediatric): Secondary | ICD-10-CM | POA: Diagnosis not present

## 2023-12-12 DIAGNOSIS — K5900 Constipation, unspecified: Secondary | ICD-10-CM | POA: Diagnosis not present

## 2023-12-12 DIAGNOSIS — Z9049 Acquired absence of other specified parts of digestive tract: Secondary | ICD-10-CM | POA: Diagnosis not present

## 2023-12-12 DIAGNOSIS — M810 Age-related osteoporosis without current pathological fracture: Secondary | ICD-10-CM | POA: Diagnosis not present

## 2023-12-12 DIAGNOSIS — D62 Acute posthemorrhagic anemia: Secondary | ICD-10-CM | POA: Diagnosis not present

## 2023-12-12 DIAGNOSIS — Z87891 Personal history of nicotine dependence: Secondary | ICD-10-CM | POA: Diagnosis not present

## 2023-12-12 DIAGNOSIS — Z941 Heart transplant status: Secondary | ICD-10-CM | POA: Diagnosis not present

## 2023-12-12 DIAGNOSIS — E039 Hypothyroidism, unspecified: Secondary | ICD-10-CM | POA: Diagnosis not present

## 2023-12-12 DIAGNOSIS — H9193 Unspecified hearing loss, bilateral: Secondary | ICD-10-CM | POA: Diagnosis not present

## 2023-12-12 DIAGNOSIS — Z9181 History of falling: Secondary | ICD-10-CM | POA: Diagnosis not present

## 2023-12-12 DIAGNOSIS — Z7982 Long term (current) use of aspirin: Secondary | ICD-10-CM | POA: Diagnosis not present

## 2023-12-12 DIAGNOSIS — F419 Anxiety disorder, unspecified: Secondary | ICD-10-CM | POA: Diagnosis not present

## 2023-12-12 DIAGNOSIS — N179 Acute kidney failure, unspecified: Secondary | ICD-10-CM | POA: Diagnosis not present

## 2023-12-12 DIAGNOSIS — Z90722 Acquired absence of ovaries, bilateral: Secondary | ICD-10-CM | POA: Diagnosis not present

## 2023-12-13 ENCOUNTER — Encounter

## 2023-12-13 DIAGNOSIS — Z9181 History of falling: Secondary | ICD-10-CM | POA: Diagnosis not present

## 2023-12-13 DIAGNOSIS — E039 Hypothyroidism, unspecified: Secondary | ICD-10-CM | POA: Diagnosis not present

## 2023-12-13 DIAGNOSIS — N179 Acute kidney failure, unspecified: Secondary | ICD-10-CM | POA: Diagnosis not present

## 2023-12-13 DIAGNOSIS — Z86718 Personal history of other venous thrombosis and embolism: Secondary | ICD-10-CM | POA: Diagnosis not present

## 2023-12-13 DIAGNOSIS — I2510 Atherosclerotic heart disease of native coronary artery without angina pectoris: Secondary | ICD-10-CM | POA: Diagnosis not present

## 2023-12-13 DIAGNOSIS — K219 Gastro-esophageal reflux disease without esophagitis: Secondary | ICD-10-CM | POA: Diagnosis not present

## 2023-12-13 DIAGNOSIS — Z90710 Acquired absence of both cervix and uterus: Secondary | ICD-10-CM | POA: Diagnosis not present

## 2023-12-13 DIAGNOSIS — Z87891 Personal history of nicotine dependence: Secondary | ICD-10-CM | POA: Diagnosis not present

## 2023-12-13 DIAGNOSIS — D72829 Elevated white blood cell count, unspecified: Secondary | ICD-10-CM | POA: Diagnosis not present

## 2023-12-13 DIAGNOSIS — Z48812 Encounter for surgical aftercare following surgery on the circulatory system: Secondary | ICD-10-CM | POA: Diagnosis not present

## 2023-12-13 DIAGNOSIS — I10 Essential (primary) hypertension: Secondary | ICD-10-CM | POA: Diagnosis not present

## 2023-12-13 DIAGNOSIS — D62 Acute posthemorrhagic anemia: Secondary | ICD-10-CM | POA: Diagnosis not present

## 2023-12-13 DIAGNOSIS — Z7951 Long term (current) use of inhaled steroids: Secondary | ICD-10-CM | POA: Diagnosis not present

## 2023-12-13 DIAGNOSIS — Z7982 Long term (current) use of aspirin: Secondary | ICD-10-CM | POA: Diagnosis not present

## 2023-12-13 DIAGNOSIS — Z941 Heart transplant status: Secondary | ICD-10-CM | POA: Diagnosis not present

## 2023-12-13 DIAGNOSIS — Z5982 Transportation insecurity: Secondary | ICD-10-CM | POA: Diagnosis not present

## 2023-12-13 DIAGNOSIS — Z9049 Acquired absence of other specified parts of digestive tract: Secondary | ICD-10-CM | POA: Diagnosis not present

## 2023-12-13 DIAGNOSIS — M810 Age-related osteoporosis without current pathological fracture: Secondary | ICD-10-CM | POA: Diagnosis not present

## 2023-12-13 DIAGNOSIS — Z90722 Acquired absence of ovaries, bilateral: Secondary | ICD-10-CM | POA: Diagnosis not present

## 2023-12-13 DIAGNOSIS — G4733 Obstructive sleep apnea (adult) (pediatric): Secondary | ICD-10-CM | POA: Diagnosis not present

## 2023-12-13 DIAGNOSIS — K5900 Constipation, unspecified: Secondary | ICD-10-CM | POA: Diagnosis not present

## 2023-12-13 DIAGNOSIS — F32A Depression, unspecified: Secondary | ICD-10-CM | POA: Diagnosis not present

## 2023-12-13 DIAGNOSIS — F419 Anxiety disorder, unspecified: Secondary | ICD-10-CM | POA: Diagnosis not present

## 2023-12-13 DIAGNOSIS — Z7901 Long term (current) use of anticoagulants: Secondary | ICD-10-CM | POA: Diagnosis not present

## 2023-12-13 DIAGNOSIS — H9193 Unspecified hearing loss, bilateral: Secondary | ICD-10-CM | POA: Diagnosis not present

## 2023-12-14 DIAGNOSIS — Z7951 Long term (current) use of inhaled steroids: Secondary | ICD-10-CM | POA: Diagnosis not present

## 2023-12-14 DIAGNOSIS — Z9049 Acquired absence of other specified parts of digestive tract: Secondary | ICD-10-CM | POA: Diagnosis not present

## 2023-12-14 DIAGNOSIS — Z7982 Long term (current) use of aspirin: Secondary | ICD-10-CM | POA: Diagnosis not present

## 2023-12-14 DIAGNOSIS — H9193 Unspecified hearing loss, bilateral: Secondary | ICD-10-CM | POA: Diagnosis not present

## 2023-12-14 DIAGNOSIS — F32A Depression, unspecified: Secondary | ICD-10-CM | POA: Diagnosis not present

## 2023-12-14 DIAGNOSIS — N179 Acute kidney failure, unspecified: Secondary | ICD-10-CM | POA: Diagnosis not present

## 2023-12-14 DIAGNOSIS — I10 Essential (primary) hypertension: Secondary | ICD-10-CM | POA: Diagnosis not present

## 2023-12-14 DIAGNOSIS — Z86718 Personal history of other venous thrombosis and embolism: Secondary | ICD-10-CM | POA: Diagnosis not present

## 2023-12-14 DIAGNOSIS — E039 Hypothyroidism, unspecified: Secondary | ICD-10-CM | POA: Diagnosis not present

## 2023-12-14 DIAGNOSIS — K5900 Constipation, unspecified: Secondary | ICD-10-CM | POA: Diagnosis not present

## 2023-12-14 DIAGNOSIS — Z87891 Personal history of nicotine dependence: Secondary | ICD-10-CM | POA: Diagnosis not present

## 2023-12-14 DIAGNOSIS — Z5982 Transportation insecurity: Secondary | ICD-10-CM | POA: Diagnosis not present

## 2023-12-14 DIAGNOSIS — D62 Acute posthemorrhagic anemia: Secondary | ICD-10-CM | POA: Diagnosis not present

## 2023-12-14 DIAGNOSIS — Z9181 History of falling: Secondary | ICD-10-CM | POA: Diagnosis not present

## 2023-12-14 DIAGNOSIS — Z90710 Acquired absence of both cervix and uterus: Secondary | ICD-10-CM | POA: Diagnosis not present

## 2023-12-14 DIAGNOSIS — Z941 Heart transplant status: Secondary | ICD-10-CM | POA: Diagnosis not present

## 2023-12-14 DIAGNOSIS — Z48812 Encounter for surgical aftercare following surgery on the circulatory system: Secondary | ICD-10-CM | POA: Diagnosis not present

## 2023-12-14 DIAGNOSIS — G4733 Obstructive sleep apnea (adult) (pediatric): Secondary | ICD-10-CM | POA: Diagnosis not present

## 2023-12-14 DIAGNOSIS — M810 Age-related osteoporosis without current pathological fracture: Secondary | ICD-10-CM | POA: Diagnosis not present

## 2023-12-14 DIAGNOSIS — D72829 Elevated white blood cell count, unspecified: Secondary | ICD-10-CM | POA: Diagnosis not present

## 2023-12-14 DIAGNOSIS — I2510 Atherosclerotic heart disease of native coronary artery without angina pectoris: Secondary | ICD-10-CM | POA: Diagnosis not present

## 2023-12-14 DIAGNOSIS — Z90722 Acquired absence of ovaries, bilateral: Secondary | ICD-10-CM | POA: Diagnosis not present

## 2023-12-14 DIAGNOSIS — Z7901 Long term (current) use of anticoagulants: Secondary | ICD-10-CM | POA: Diagnosis not present

## 2023-12-14 DIAGNOSIS — K219 Gastro-esophageal reflux disease without esophagitis: Secondary | ICD-10-CM | POA: Diagnosis not present

## 2023-12-14 DIAGNOSIS — F419 Anxiety disorder, unspecified: Secondary | ICD-10-CM | POA: Diagnosis not present

## 2023-12-17 DIAGNOSIS — Z90722 Acquired absence of ovaries, bilateral: Secondary | ICD-10-CM | POA: Diagnosis not present

## 2023-12-17 DIAGNOSIS — Z90710 Acquired absence of both cervix and uterus: Secondary | ICD-10-CM | POA: Diagnosis not present

## 2023-12-17 DIAGNOSIS — Z7901 Long term (current) use of anticoagulants: Secondary | ICD-10-CM | POA: Diagnosis not present

## 2023-12-17 DIAGNOSIS — K219 Gastro-esophageal reflux disease without esophagitis: Secondary | ICD-10-CM | POA: Diagnosis not present

## 2023-12-17 DIAGNOSIS — M810 Age-related osteoporosis without current pathological fracture: Secondary | ICD-10-CM | POA: Diagnosis not present

## 2023-12-17 DIAGNOSIS — G4733 Obstructive sleep apnea (adult) (pediatric): Secondary | ICD-10-CM | POA: Diagnosis not present

## 2023-12-17 DIAGNOSIS — Z86718 Personal history of other venous thrombosis and embolism: Secondary | ICD-10-CM | POA: Diagnosis not present

## 2023-12-17 DIAGNOSIS — Z9181 History of falling: Secondary | ICD-10-CM | POA: Diagnosis not present

## 2023-12-17 DIAGNOSIS — H9193 Unspecified hearing loss, bilateral: Secondary | ICD-10-CM | POA: Diagnosis not present

## 2023-12-17 DIAGNOSIS — Z48812 Encounter for surgical aftercare following surgery on the circulatory system: Secondary | ICD-10-CM | POA: Diagnosis not present

## 2023-12-17 DIAGNOSIS — I10 Essential (primary) hypertension: Secondary | ICD-10-CM | POA: Diagnosis not present

## 2023-12-17 DIAGNOSIS — Z9049 Acquired absence of other specified parts of digestive tract: Secondary | ICD-10-CM | POA: Diagnosis not present

## 2023-12-17 DIAGNOSIS — Z941 Heart transplant status: Secondary | ICD-10-CM | POA: Diagnosis not present

## 2023-12-17 DIAGNOSIS — Z87891 Personal history of nicotine dependence: Secondary | ICD-10-CM | POA: Diagnosis not present

## 2023-12-17 DIAGNOSIS — N179 Acute kidney failure, unspecified: Secondary | ICD-10-CM | POA: Diagnosis not present

## 2023-12-17 DIAGNOSIS — D62 Acute posthemorrhagic anemia: Secondary | ICD-10-CM | POA: Diagnosis not present

## 2023-12-17 DIAGNOSIS — D72829 Elevated white blood cell count, unspecified: Secondary | ICD-10-CM | POA: Diagnosis not present

## 2023-12-17 DIAGNOSIS — F419 Anxiety disorder, unspecified: Secondary | ICD-10-CM | POA: Diagnosis not present

## 2023-12-17 DIAGNOSIS — Z7982 Long term (current) use of aspirin: Secondary | ICD-10-CM | POA: Diagnosis not present

## 2023-12-17 DIAGNOSIS — E039 Hypothyroidism, unspecified: Secondary | ICD-10-CM | POA: Diagnosis not present

## 2023-12-17 DIAGNOSIS — K5900 Constipation, unspecified: Secondary | ICD-10-CM | POA: Diagnosis not present

## 2023-12-17 DIAGNOSIS — Z7951 Long term (current) use of inhaled steroids: Secondary | ICD-10-CM | POA: Diagnosis not present

## 2023-12-17 DIAGNOSIS — I2510 Atherosclerotic heart disease of native coronary artery without angina pectoris: Secondary | ICD-10-CM | POA: Diagnosis not present

## 2023-12-17 DIAGNOSIS — Z5982 Transportation insecurity: Secondary | ICD-10-CM | POA: Diagnosis not present

## 2023-12-17 DIAGNOSIS — F32A Depression, unspecified: Secondary | ICD-10-CM | POA: Diagnosis not present

## 2023-12-19 DIAGNOSIS — F32A Depression, unspecified: Secondary | ICD-10-CM | POA: Diagnosis not present

## 2023-12-19 DIAGNOSIS — Z7951 Long term (current) use of inhaled steroids: Secondary | ICD-10-CM | POA: Diagnosis not present

## 2023-12-19 DIAGNOSIS — D62 Acute posthemorrhagic anemia: Secondary | ICD-10-CM | POA: Diagnosis not present

## 2023-12-19 DIAGNOSIS — D72829 Elevated white blood cell count, unspecified: Secondary | ICD-10-CM | POA: Diagnosis not present

## 2023-12-19 DIAGNOSIS — Z86718 Personal history of other venous thrombosis and embolism: Secondary | ICD-10-CM | POA: Diagnosis not present

## 2023-12-19 DIAGNOSIS — F419 Anxiety disorder, unspecified: Secondary | ICD-10-CM | POA: Diagnosis not present

## 2023-12-19 DIAGNOSIS — K5900 Constipation, unspecified: Secondary | ICD-10-CM | POA: Diagnosis not present

## 2023-12-19 DIAGNOSIS — H9193 Unspecified hearing loss, bilateral: Secondary | ICD-10-CM | POA: Diagnosis not present

## 2023-12-19 DIAGNOSIS — Z7982 Long term (current) use of aspirin: Secondary | ICD-10-CM | POA: Diagnosis not present

## 2023-12-19 DIAGNOSIS — Z5982 Transportation insecurity: Secondary | ICD-10-CM | POA: Diagnosis not present

## 2023-12-19 DIAGNOSIS — Z90722 Acquired absence of ovaries, bilateral: Secondary | ICD-10-CM | POA: Diagnosis not present

## 2023-12-19 DIAGNOSIS — M810 Age-related osteoporosis without current pathological fracture: Secondary | ICD-10-CM | POA: Diagnosis not present

## 2023-12-19 DIAGNOSIS — Z9049 Acquired absence of other specified parts of digestive tract: Secondary | ICD-10-CM | POA: Diagnosis not present

## 2023-12-19 DIAGNOSIS — Z87891 Personal history of nicotine dependence: Secondary | ICD-10-CM | POA: Diagnosis not present

## 2023-12-19 DIAGNOSIS — I2510 Atherosclerotic heart disease of native coronary artery without angina pectoris: Secondary | ICD-10-CM | POA: Diagnosis not present

## 2023-12-19 DIAGNOSIS — Z9181 History of falling: Secondary | ICD-10-CM | POA: Diagnosis not present

## 2023-12-19 DIAGNOSIS — K219 Gastro-esophageal reflux disease without esophagitis: Secondary | ICD-10-CM | POA: Diagnosis not present

## 2023-12-19 DIAGNOSIS — N179 Acute kidney failure, unspecified: Secondary | ICD-10-CM | POA: Diagnosis not present

## 2023-12-19 DIAGNOSIS — E039 Hypothyroidism, unspecified: Secondary | ICD-10-CM | POA: Diagnosis not present

## 2023-12-19 DIAGNOSIS — Z90710 Acquired absence of both cervix and uterus: Secondary | ICD-10-CM | POA: Diagnosis not present

## 2023-12-19 DIAGNOSIS — Z48812 Encounter for surgical aftercare following surgery on the circulatory system: Secondary | ICD-10-CM | POA: Diagnosis not present

## 2023-12-19 DIAGNOSIS — Z7901 Long term (current) use of anticoagulants: Secondary | ICD-10-CM | POA: Diagnosis not present

## 2023-12-19 DIAGNOSIS — G4733 Obstructive sleep apnea (adult) (pediatric): Secondary | ICD-10-CM | POA: Diagnosis not present

## 2023-12-19 DIAGNOSIS — Z941 Heart transplant status: Secondary | ICD-10-CM | POA: Diagnosis not present

## 2023-12-19 DIAGNOSIS — I10 Essential (primary) hypertension: Secondary | ICD-10-CM | POA: Diagnosis not present

## 2023-12-24 ENCOUNTER — Ambulatory Visit

## 2023-12-24 DIAGNOSIS — C44329 Squamous cell carcinoma of skin of other parts of face: Secondary | ICD-10-CM | POA: Diagnosis not present

## 2023-12-27 DIAGNOSIS — Z90710 Acquired absence of both cervix and uterus: Secondary | ICD-10-CM | POA: Diagnosis not present

## 2023-12-27 DIAGNOSIS — F32A Depression, unspecified: Secondary | ICD-10-CM | POA: Diagnosis not present

## 2023-12-27 DIAGNOSIS — F419 Anxiety disorder, unspecified: Secondary | ICD-10-CM | POA: Diagnosis not present

## 2023-12-27 DIAGNOSIS — Z5982 Transportation insecurity: Secondary | ICD-10-CM | POA: Diagnosis not present

## 2023-12-27 DIAGNOSIS — D62 Acute posthemorrhagic anemia: Secondary | ICD-10-CM | POA: Diagnosis not present

## 2023-12-27 DIAGNOSIS — K5900 Constipation, unspecified: Secondary | ICD-10-CM | POA: Diagnosis not present

## 2023-12-27 DIAGNOSIS — Z87891 Personal history of nicotine dependence: Secondary | ICD-10-CM | POA: Diagnosis not present

## 2023-12-27 DIAGNOSIS — E039 Hypothyroidism, unspecified: Secondary | ICD-10-CM | POA: Diagnosis not present

## 2023-12-27 DIAGNOSIS — G4733 Obstructive sleep apnea (adult) (pediatric): Secondary | ICD-10-CM | POA: Diagnosis not present

## 2023-12-27 DIAGNOSIS — Z941 Heart transplant status: Secondary | ICD-10-CM | POA: Diagnosis not present

## 2023-12-27 DIAGNOSIS — Z86718 Personal history of other venous thrombosis and embolism: Secondary | ICD-10-CM | POA: Diagnosis not present

## 2023-12-27 DIAGNOSIS — Z9181 History of falling: Secondary | ICD-10-CM | POA: Diagnosis not present

## 2023-12-27 DIAGNOSIS — M810 Age-related osteoporosis without current pathological fracture: Secondary | ICD-10-CM | POA: Diagnosis not present

## 2023-12-27 DIAGNOSIS — N179 Acute kidney failure, unspecified: Secondary | ICD-10-CM | POA: Diagnosis not present

## 2023-12-27 DIAGNOSIS — K219 Gastro-esophageal reflux disease without esophagitis: Secondary | ICD-10-CM | POA: Diagnosis not present

## 2023-12-27 DIAGNOSIS — Z7901 Long term (current) use of anticoagulants: Secondary | ICD-10-CM | POA: Diagnosis not present

## 2023-12-27 DIAGNOSIS — Z7951 Long term (current) use of inhaled steroids: Secondary | ICD-10-CM | POA: Diagnosis not present

## 2023-12-27 DIAGNOSIS — H9193 Unspecified hearing loss, bilateral: Secondary | ICD-10-CM | POA: Diagnosis not present

## 2023-12-27 DIAGNOSIS — D72829 Elevated white blood cell count, unspecified: Secondary | ICD-10-CM | POA: Diagnosis not present

## 2023-12-27 DIAGNOSIS — Z48812 Encounter for surgical aftercare following surgery on the circulatory system: Secondary | ICD-10-CM | POA: Diagnosis not present

## 2023-12-27 DIAGNOSIS — Z7982 Long term (current) use of aspirin: Secondary | ICD-10-CM | POA: Diagnosis not present

## 2023-12-27 DIAGNOSIS — Z9049 Acquired absence of other specified parts of digestive tract: Secondary | ICD-10-CM | POA: Diagnosis not present

## 2023-12-27 DIAGNOSIS — Z90722 Acquired absence of ovaries, bilateral: Secondary | ICD-10-CM | POA: Diagnosis not present

## 2023-12-27 DIAGNOSIS — I2510 Atherosclerotic heart disease of native coronary artery without angina pectoris: Secondary | ICD-10-CM | POA: Diagnosis not present

## 2023-12-27 DIAGNOSIS — I10 Essential (primary) hypertension: Secondary | ICD-10-CM | POA: Diagnosis not present

## 2024-01-02 DIAGNOSIS — F32A Depression, unspecified: Secondary | ICD-10-CM | POA: Diagnosis not present

## 2024-01-02 DIAGNOSIS — I2510 Atherosclerotic heart disease of native coronary artery without angina pectoris: Secondary | ICD-10-CM | POA: Diagnosis not present

## 2024-01-02 DIAGNOSIS — Z9181 History of falling: Secondary | ICD-10-CM | POA: Diagnosis not present

## 2024-01-02 DIAGNOSIS — Z87891 Personal history of nicotine dependence: Secondary | ICD-10-CM | POA: Diagnosis not present

## 2024-01-02 DIAGNOSIS — Z90710 Acquired absence of both cervix and uterus: Secondary | ICD-10-CM | POA: Diagnosis not present

## 2024-01-02 DIAGNOSIS — Z9049 Acquired absence of other specified parts of digestive tract: Secondary | ICD-10-CM | POA: Diagnosis not present

## 2024-01-02 DIAGNOSIS — Z86718 Personal history of other venous thrombosis and embolism: Secondary | ICD-10-CM | POA: Diagnosis not present

## 2024-01-02 DIAGNOSIS — Z5982 Transportation insecurity: Secondary | ICD-10-CM | POA: Diagnosis not present

## 2024-01-02 DIAGNOSIS — M810 Age-related osteoporosis without current pathological fracture: Secondary | ICD-10-CM | POA: Diagnosis not present

## 2024-01-02 DIAGNOSIS — Z7901 Long term (current) use of anticoagulants: Secondary | ICD-10-CM | POA: Diagnosis not present

## 2024-01-02 DIAGNOSIS — G4733 Obstructive sleep apnea (adult) (pediatric): Secondary | ICD-10-CM | POA: Diagnosis not present

## 2024-01-02 DIAGNOSIS — D72829 Elevated white blood cell count, unspecified: Secondary | ICD-10-CM | POA: Diagnosis not present

## 2024-01-02 DIAGNOSIS — Z7951 Long term (current) use of inhaled steroids: Secondary | ICD-10-CM | POA: Diagnosis not present

## 2024-01-02 DIAGNOSIS — F419 Anxiety disorder, unspecified: Secondary | ICD-10-CM | POA: Diagnosis not present

## 2024-01-02 DIAGNOSIS — K219 Gastro-esophageal reflux disease without esophagitis: Secondary | ICD-10-CM | POA: Diagnosis not present

## 2024-01-02 DIAGNOSIS — Z48812 Encounter for surgical aftercare following surgery on the circulatory system: Secondary | ICD-10-CM | POA: Diagnosis not present

## 2024-01-02 DIAGNOSIS — K5900 Constipation, unspecified: Secondary | ICD-10-CM | POA: Diagnosis not present

## 2024-01-02 DIAGNOSIS — E039 Hypothyroidism, unspecified: Secondary | ICD-10-CM | POA: Diagnosis not present

## 2024-01-02 DIAGNOSIS — D62 Acute posthemorrhagic anemia: Secondary | ICD-10-CM | POA: Diagnosis not present

## 2024-01-02 DIAGNOSIS — Z90722 Acquired absence of ovaries, bilateral: Secondary | ICD-10-CM | POA: Diagnosis not present

## 2024-01-02 DIAGNOSIS — N179 Acute kidney failure, unspecified: Secondary | ICD-10-CM | POA: Diagnosis not present

## 2024-01-02 DIAGNOSIS — I10 Essential (primary) hypertension: Secondary | ICD-10-CM | POA: Diagnosis not present

## 2024-01-02 DIAGNOSIS — H9193 Unspecified hearing loss, bilateral: Secondary | ICD-10-CM | POA: Diagnosis not present

## 2024-01-02 DIAGNOSIS — Z941 Heart transplant status: Secondary | ICD-10-CM | POA: Diagnosis not present

## 2024-01-02 DIAGNOSIS — Z7982 Long term (current) use of aspirin: Secondary | ICD-10-CM | POA: Diagnosis not present

## 2024-01-07 DIAGNOSIS — Z941 Heart transplant status: Secondary | ICD-10-CM | POA: Diagnosis not present

## 2024-01-07 DIAGNOSIS — Z79899 Other long term (current) drug therapy: Secondary | ICD-10-CM | POA: Diagnosis not present

## 2024-01-07 DIAGNOSIS — Z86718 Personal history of other venous thrombosis and embolism: Secondary | ICD-10-CM | POA: Diagnosis not present

## 2024-01-07 DIAGNOSIS — I2510 Atherosclerotic heart disease of native coronary artery without angina pectoris: Secondary | ICD-10-CM | POA: Diagnosis not present

## 2024-01-07 DIAGNOSIS — Z4821 Encounter for aftercare following heart transplant: Secondary | ICD-10-CM | POA: Diagnosis not present

## 2024-01-07 DIAGNOSIS — R918 Other nonspecific abnormal finding of lung field: Secondary | ICD-10-CM | POA: Diagnosis not present

## 2024-01-07 DIAGNOSIS — R198 Other specified symptoms and signs involving the digestive system and abdomen: Secondary | ICD-10-CM | POA: Diagnosis not present

## 2024-01-07 DIAGNOSIS — K449 Diaphragmatic hernia without obstruction or gangrene: Secondary | ICD-10-CM | POA: Diagnosis not present

## 2024-01-07 DIAGNOSIS — R002 Palpitations: Secondary | ICD-10-CM | POA: Diagnosis not present

## 2024-01-07 DIAGNOSIS — G4733 Obstructive sleep apnea (adult) (pediatric): Secondary | ICD-10-CM | POA: Diagnosis not present

## 2024-01-07 DIAGNOSIS — R5382 Chronic fatigue, unspecified: Secondary | ICD-10-CM | POA: Diagnosis not present

## 2024-01-07 DIAGNOSIS — Z8679 Personal history of other diseases of the circulatory system: Secondary | ICD-10-CM | POA: Diagnosis not present

## 2024-01-07 DIAGNOSIS — I252 Old myocardial infarction: Secondary | ICD-10-CM | POA: Diagnosis not present

## 2024-01-07 DIAGNOSIS — R942 Abnormal results of pulmonary function studies: Secondary | ICD-10-CM | POA: Diagnosis not present

## 2024-01-07 DIAGNOSIS — E890 Postprocedural hypothyroidism: Secondary | ICD-10-CM | POA: Diagnosis not present

## 2024-01-07 DIAGNOSIS — K56609 Unspecified intestinal obstruction, unspecified as to partial versus complete obstruction: Secondary | ICD-10-CM | POA: Diagnosis not present

## 2024-01-07 DIAGNOSIS — Z8619 Personal history of other infectious and parasitic diseases: Secondary | ICD-10-CM | POA: Diagnosis not present

## 2024-01-07 DIAGNOSIS — J986 Disorders of diaphragm: Secondary | ICD-10-CM | POA: Diagnosis not present

## 2024-01-07 DIAGNOSIS — K529 Noninfective gastroenteritis and colitis, unspecified: Secondary | ICD-10-CM | POA: Diagnosis not present

## 2024-01-07 DIAGNOSIS — R110 Nausea: Secondary | ICD-10-CM | POA: Diagnosis not present

## 2024-01-07 DIAGNOSIS — Z136 Encounter for screening for cardiovascular disorders: Secondary | ICD-10-CM | POA: Diagnosis not present

## 2024-01-07 DIAGNOSIS — J479 Bronchiectasis, uncomplicated: Secondary | ICD-10-CM | POA: Diagnosis not present

## 2024-01-07 DIAGNOSIS — I351 Nonrheumatic aortic (valve) insufficiency: Secondary | ICD-10-CM | POA: Diagnosis not present

## 2024-01-07 DIAGNOSIS — K51914 Ulcerative colitis, unspecified with abscess: Secondary | ICD-10-CM | POA: Diagnosis not present

## 2024-01-07 DIAGNOSIS — J984 Other disorders of lung: Secondary | ICD-10-CM | POA: Diagnosis not present

## 2024-01-07 DIAGNOSIS — I10 Essential (primary) hypertension: Secondary | ICD-10-CM | POA: Diagnosis not present

## 2024-01-08 DIAGNOSIS — Z7901 Long term (current) use of anticoagulants: Secondary | ICD-10-CM | POA: Diagnosis not present

## 2024-01-08 DIAGNOSIS — M810 Age-related osteoporosis without current pathological fracture: Secondary | ICD-10-CM | POA: Diagnosis not present

## 2024-01-08 DIAGNOSIS — Z7951 Long term (current) use of inhaled steroids: Secondary | ICD-10-CM | POA: Diagnosis not present

## 2024-01-08 DIAGNOSIS — N179 Acute kidney failure, unspecified: Secondary | ICD-10-CM | POA: Diagnosis not present

## 2024-01-08 DIAGNOSIS — Z5982 Transportation insecurity: Secondary | ICD-10-CM | POA: Diagnosis not present

## 2024-01-08 DIAGNOSIS — H9193 Unspecified hearing loss, bilateral: Secondary | ICD-10-CM | POA: Diagnosis not present

## 2024-01-08 DIAGNOSIS — Z7982 Long term (current) use of aspirin: Secondary | ICD-10-CM | POA: Diagnosis not present

## 2024-01-08 DIAGNOSIS — I10 Essential (primary) hypertension: Secondary | ICD-10-CM | POA: Diagnosis not present

## 2024-01-08 DIAGNOSIS — F32A Depression, unspecified: Secondary | ICD-10-CM | POA: Diagnosis not present

## 2024-01-08 DIAGNOSIS — Z86718 Personal history of other venous thrombosis and embolism: Secondary | ICD-10-CM | POA: Diagnosis not present

## 2024-01-08 DIAGNOSIS — D62 Acute posthemorrhagic anemia: Secondary | ICD-10-CM | POA: Diagnosis not present

## 2024-01-08 DIAGNOSIS — Z90722 Acquired absence of ovaries, bilateral: Secondary | ICD-10-CM | POA: Diagnosis not present

## 2024-01-08 DIAGNOSIS — D72829 Elevated white blood cell count, unspecified: Secondary | ICD-10-CM | POA: Diagnosis not present

## 2024-01-08 DIAGNOSIS — K219 Gastro-esophageal reflux disease without esophagitis: Secondary | ICD-10-CM | POA: Diagnosis not present

## 2024-01-08 DIAGNOSIS — F419 Anxiety disorder, unspecified: Secondary | ICD-10-CM | POA: Diagnosis not present

## 2024-01-08 DIAGNOSIS — I2510 Atherosclerotic heart disease of native coronary artery without angina pectoris: Secondary | ICD-10-CM | POA: Diagnosis not present

## 2024-01-08 DIAGNOSIS — Z87891 Personal history of nicotine dependence: Secondary | ICD-10-CM | POA: Diagnosis not present

## 2024-01-08 DIAGNOSIS — G4733 Obstructive sleep apnea (adult) (pediatric): Secondary | ICD-10-CM | POA: Diagnosis not present

## 2024-01-08 DIAGNOSIS — K5900 Constipation, unspecified: Secondary | ICD-10-CM | POA: Diagnosis not present

## 2024-01-08 DIAGNOSIS — Z48812 Encounter for surgical aftercare following surgery on the circulatory system: Secondary | ICD-10-CM | POA: Diagnosis not present

## 2024-01-08 DIAGNOSIS — E039 Hypothyroidism, unspecified: Secondary | ICD-10-CM | POA: Diagnosis not present

## 2024-01-08 DIAGNOSIS — Z9181 History of falling: Secondary | ICD-10-CM | POA: Diagnosis not present

## 2024-01-08 DIAGNOSIS — Z941 Heart transplant status: Secondary | ICD-10-CM | POA: Diagnosis not present

## 2024-01-08 DIAGNOSIS — Z9049 Acquired absence of other specified parts of digestive tract: Secondary | ICD-10-CM | POA: Diagnosis not present

## 2024-01-08 DIAGNOSIS — Z90710 Acquired absence of both cervix and uterus: Secondary | ICD-10-CM | POA: Diagnosis not present

## 2024-01-11 DIAGNOSIS — Z7960 Long term (current) use of unspecified immunomodulators and immunosuppressants: Secondary | ICD-10-CM | POA: Diagnosis not present

## 2024-01-11 DIAGNOSIS — Z87891 Personal history of nicotine dependence: Secondary | ICD-10-CM | POA: Diagnosis not present

## 2024-01-11 DIAGNOSIS — Z941 Heart transplant status: Secondary | ICD-10-CM | POA: Diagnosis not present

## 2024-01-18 DIAGNOSIS — H2512 Age-related nuclear cataract, left eye: Secondary | ICD-10-CM | POA: Diagnosis not present

## 2024-01-18 DIAGNOSIS — H2000 Unspecified acute and subacute iridocyclitis: Secondary | ICD-10-CM | POA: Diagnosis not present

## 2024-01-18 DIAGNOSIS — H5712 Ocular pain, left eye: Secondary | ICD-10-CM | POA: Diagnosis not present

## 2024-01-25 DIAGNOSIS — H02834 Dermatochalasis of left upper eyelid: Secondary | ICD-10-CM | POA: Diagnosis not present

## 2024-01-25 DIAGNOSIS — H2512 Age-related nuclear cataract, left eye: Secondary | ICD-10-CM | POA: Diagnosis not present

## 2024-01-25 DIAGNOSIS — H5712 Ocular pain, left eye: Secondary | ICD-10-CM | POA: Diagnosis not present

## 2024-01-29 DIAGNOSIS — H2512 Age-related nuclear cataract, left eye: Secondary | ICD-10-CM | POA: Diagnosis not present

## 2024-01-29 DIAGNOSIS — H353131 Nonexudative age-related macular degeneration, bilateral, early dry stage: Secondary | ICD-10-CM | POA: Diagnosis not present

## 2024-03-14 DIAGNOSIS — I10 Essential (primary) hypertension: Secondary | ICD-10-CM | POA: Diagnosis not present

## 2024-03-14 DIAGNOSIS — G44209 Tension-type headache, unspecified, not intractable: Secondary | ICD-10-CM | POA: Diagnosis not present

## 2024-03-20 DIAGNOSIS — H2512 Age-related nuclear cataract, left eye: Secondary | ICD-10-CM | POA: Diagnosis not present

## 2024-05-13 DIAGNOSIS — Z23 Encounter for immunization: Secondary | ICD-10-CM | POA: Diagnosis not present

## 2024-06-20 DIAGNOSIS — J209 Acute bronchitis, unspecified: Secondary | ICD-10-CM | POA: Diagnosis not present

## 2024-06-20 DIAGNOSIS — R051 Acute cough: Secondary | ICD-10-CM | POA: Diagnosis not present

## 2024-06-20 DIAGNOSIS — J029 Acute pharyngitis, unspecified: Secondary | ICD-10-CM | POA: Diagnosis not present
